# Patient Record
Sex: Female | Born: 1935
Health system: Southern US, Community
[De-identification: ages and names within clinical notes are randomized; demographics above are authoritative.]

## PROBLEM LIST (undated history)

## (undated) DIAGNOSIS — E039 Hypothyroidism, unspecified: Secondary | ICD-10-CM

## (undated) DIAGNOSIS — K219 Gastro-esophageal reflux disease without esophagitis: Secondary | ICD-10-CM

## (undated) DIAGNOSIS — M797 Fibromyalgia: Secondary | ICD-10-CM

## (undated) DIAGNOSIS — E782 Mixed hyperlipidemia: Secondary | ICD-10-CM

## (undated) DIAGNOSIS — N390 Urinary tract infection, site not specified: Secondary | ICD-10-CM

## (undated) DIAGNOSIS — R001 Bradycardia, unspecified: Secondary | ICD-10-CM

## (undated) DIAGNOSIS — M1990 Unspecified osteoarthritis, unspecified site: Secondary | ICD-10-CM

## (undated) DIAGNOSIS — Z9289 Personal history of other medical treatment: Secondary | ICD-10-CM

## (undated) DIAGNOSIS — F419 Anxiety disorder, unspecified: Secondary | ICD-10-CM

## (undated) HISTORY — DX: Personal history of other medical treatment: Z92.89

## (undated) HISTORY — DX: Hypothyroidism, unspecified: E03.9

## (undated) HISTORY — PX: ABDOMINAL HYSTERECTOMY: SHX81

## (undated) HISTORY — PX: CHOLECYSTECTOMY: SHX55

## (undated) HISTORY — DX: Bradycardia, unspecified: R00.1

## (undated) HISTORY — DX: Paroxysmal atrial fibrillation: I48.0

## (undated) HISTORY — DX: Anxiety disorder, unspecified: F41.9

## (undated) HISTORY — DX: Fibromyalgia: M79.7

## (undated) HISTORY — PX: MOHS SURGERY: SUR867

## (undated) HISTORY — DX: Essential (primary) hypertension: I10

## (undated) HISTORY — DX: Mixed hyperlipidemia: E78.2

## (undated) HISTORY — PX: ROTATOR CUFF REPAIR: SHX139

## (undated) HISTORY — DX: Urinary tract infection, site not specified: N39.0

## (undated) HISTORY — DX: Unspecified osteoarthritis, unspecified site: M19.90

## (undated) HISTORY — DX: Gastro-esophageal reflux disease without esophagitis: K21.9

---

## 2001-06-08 ENCOUNTER — Encounter: Payer: Self-pay | Admitting: Family Medicine

## 2001-06-08 ENCOUNTER — Encounter: Admission: RE | Admit: 2001-06-08 | Discharge: 2001-06-08 | Payer: Self-pay | Admitting: Family Medicine

## 2005-06-09 ENCOUNTER — Ambulatory Visit: Payer: Self-pay | Admitting: Family Medicine

## 2006-06-14 ENCOUNTER — Ambulatory Visit: Payer: Self-pay | Admitting: Family Medicine

## 2006-06-23 ENCOUNTER — Encounter: Admission: RE | Admit: 2006-06-23 | Discharge: 2006-06-23 | Payer: Self-pay | Admitting: Family Medicine

## 2007-05-12 ENCOUNTER — Ambulatory Visit (HOSPITAL_COMMUNITY): Admission: RE | Admit: 2007-05-12 | Discharge: 2007-05-12 | Payer: Self-pay | Admitting: Urology

## 2007-05-31 ENCOUNTER — Other Ambulatory Visit: Payer: Self-pay

## 2007-05-31 ENCOUNTER — Ambulatory Visit: Payer: Self-pay | Admitting: Urology

## 2007-06-08 ENCOUNTER — Ambulatory Visit: Payer: Self-pay | Admitting: Urology

## 2007-08-15 ENCOUNTER — Encounter: Admission: RE | Admit: 2007-08-15 | Discharge: 2007-08-15 | Payer: Self-pay | Admitting: Family Medicine

## 2007-11-01 ENCOUNTER — Ambulatory Visit (HOSPITAL_COMMUNITY): Admission: RE | Admit: 2007-11-01 | Discharge: 2007-11-01 | Payer: Self-pay | Admitting: Family Medicine

## 2008-01-31 ENCOUNTER — Ambulatory Visit: Payer: Self-pay | Admitting: Urology

## 2008-08-01 ENCOUNTER — Ambulatory Visit: Payer: Self-pay | Admitting: Family Medicine

## 2008-09-13 ENCOUNTER — Encounter: Admission: RE | Admit: 2008-09-13 | Discharge: 2008-09-13 | Payer: Self-pay | Admitting: Family Medicine

## 2009-11-15 ENCOUNTER — Encounter: Admission: RE | Admit: 2009-11-15 | Discharge: 2009-11-15 | Payer: Self-pay | Admitting: Family Medicine

## 2011-10-22 ENCOUNTER — Other Ambulatory Visit: Payer: Self-pay | Admitting: Family Medicine

## 2011-10-22 DIAGNOSIS — Z1231 Encounter for screening mammogram for malignant neoplasm of breast: Secondary | ICD-10-CM

## 2011-11-13 ENCOUNTER — Ambulatory Visit
Admission: RE | Admit: 2011-11-13 | Discharge: 2011-11-13 | Disposition: A | Discharge status: Home / Self Care | Payer: Medicare HMO | Source: Ambulatory Visit | Attending: *Deleted | Admitting: *Deleted

## 2011-11-13 DIAGNOSIS — Z1231 Encounter for screening mammogram for malignant neoplasm of breast: Secondary | ICD-10-CM

## 2012-05-30 ENCOUNTER — Ambulatory Visit: Payer: Self-pay | Admitting: Family Medicine

## 2014-04-19 ENCOUNTER — Ambulatory Visit: Payer: Self-pay | Admitting: Ophthalmology

## 2014-04-19 DIAGNOSIS — Z01810 Encounter for preprocedural cardiovascular examination: Secondary | ICD-10-CM

## 2014-04-19 DIAGNOSIS — I10 Essential (primary) hypertension: Secondary | ICD-10-CM

## 2014-04-30 ENCOUNTER — Ambulatory Visit: Payer: Self-pay | Admitting: Ophthalmology

## 2014-06-04 ENCOUNTER — Ambulatory Visit: Payer: Self-pay | Admitting: Ophthalmology

## 2014-08-16 ENCOUNTER — Other Ambulatory Visit: Payer: Self-pay

## 2014-08-16 DIAGNOSIS — Z1231 Encounter for screening mammogram for malignant neoplasm of breast: Secondary | ICD-10-CM

## 2014-08-27 ENCOUNTER — Ambulatory Visit: Payer: Medicare HMO

## 2014-08-28 ENCOUNTER — Other Ambulatory Visit: Payer: Self-pay

## 2014-08-28 ENCOUNTER — Ambulatory Visit
Admission: RE | Admit: 2014-08-28 | Discharge: 2014-08-28 | Disposition: A | Discharge status: Home / Self Care | Payer: Medicare PPO | Source: Ambulatory Visit

## 2014-08-28 ENCOUNTER — Encounter (INDEPENDENT_AMBULATORY_CARE_PROVIDER_SITE_OTHER): Payer: Self-pay

## 2014-08-28 DIAGNOSIS — Z1231 Encounter for screening mammogram for malignant neoplasm of breast: Secondary | ICD-10-CM

## 2015-03-28 ENCOUNTER — Inpatient Hospital Stay
Admit: 2015-03-28 | Disposition: A | Discharge status: Home / Self Care | Payer: Self-pay | Attending: Internal Medicine | Admitting: Internal Medicine

## 2015-03-28 LAB — CBC WITH DIFFERENTIAL/PLATELET
BASOS ABS: 0.1 10*3/uL (ref 0.0–0.1)
BASOS PCT: 1.1 %
EOS ABS: 0.2 10*3/uL (ref 0.0–0.7)
Eosinophil %: 2.3 %
HCT: 42 % (ref 35.0–47.0)
HGB: 13.7 g/dL (ref 12.0–16.0)
LYMPHS ABS: 4.5 10*3/uL — AB (ref 1.0–3.6)
LYMPHS PCT: 42.8 %
MCH: 29.4 pg (ref 26.0–34.0)
MCHC: 32.6 g/dL (ref 32.0–36.0)
MCV: 90 fL (ref 80–100)
Monocyte #: 0.9 x10 3/mm (ref 0.2–0.9)
Monocyte %: 8.2 %
NEUTROS ABS: 4.8 10*3/uL (ref 1.4–6.5)
Neutrophil %: 45.6 %
Platelet: 280 10*3/uL (ref 150–440)
RBC: 4.66 10*6/uL (ref 3.80–5.20)
RDW: 13.7 % (ref 11.5–14.5)
WBC: 10.6 10*3/uL (ref 3.6–11.0)

## 2015-03-28 LAB — BASIC METABOLIC PANEL
ANION GAP: 12 (ref 7–16)
BUN: 13 mg/dL
CO2: 23 mmol/L
CREATININE: 0.83 mg/dL
Calcium, Total: 9.3 mg/dL
Chloride: 102 mmol/L
EGFR (African American): >60
GLUCOSE: 111 mg/dL — AB
POTASSIUM: 3.7 mmol/L
SODIUM: 137 mmol/L

## 2015-03-28 LAB — TROPONIN I: Troponin-I: <0.03 ng/mL

## 2015-03-28 LAB — TSH: THYROID STIMULATING HORM: 0.262 u[IU]/mL — AB

## 2015-03-28 LAB — MAGNESIUM: MAGNESIUM: 2 mg/dL

## 2015-03-28 LAB — T4, FREE: Free Thyroxine: 1.63 ng/dL — ABNORMAL HIGH

## 2015-03-29 ENCOUNTER — Other Ambulatory Visit: Payer: Self-pay | Admitting: Physician Assistant

## 2015-03-29 ENCOUNTER — Encounter: Payer: Self-pay | Admitting: Physician Assistant

## 2015-03-29 DIAGNOSIS — I4891 Unspecified atrial fibrillation: Secondary | ICD-10-CM | POA: Diagnosis not present

## 2015-03-29 DIAGNOSIS — I10 Essential (primary) hypertension: Secondary | ICD-10-CM

## 2015-03-29 LAB — COMPREHENSIVE METABOLIC PANEL
ALBUMIN: 3.3 g/dL — AB
ALK PHOS: 51 U/L
AST: 17 U/L
Anion Gap: 8 (ref 7–16)
BUN: 10 mg/dL
Bilirubin,Total: 0.7 mg/dL
CALCIUM: 8.5 mg/dL — AB
Chloride: 110 mmol/L
Co2: 23 mmol/L
Creatinine: 0.67 mg/dL
Glucose: 110 mg/dL — ABNORMAL HIGH
POTASSIUM: 3.8 mmol/L
SGPT (ALT): 12 U/L — ABNORMAL LOW
Sodium: 141 mmol/L
TOTAL PROTEIN: 5.8 g/dL — AB

## 2015-03-29 LAB — TROPONIN I

## 2015-03-29 LAB — CBC WITH DIFFERENTIAL/PLATELET
BASOS ABS: 0.1 10*3/uL (ref 0.0–0.1)
BASOS PCT: 0.8 %
EOS ABS: 0.2 10*3/uL (ref 0.0–0.7)
Eosinophil %: 2.6 %
HCT: 36.6 % (ref 35.0–47.0)
HGB: 12.2 g/dL (ref 12.0–16.0)
LYMPHS ABS: 3 10*3/uL (ref 1.0–3.6)
Lymphocyte %: 36.3 %
MCH: 30 pg (ref 26.0–34.0)
MCHC: 33.3 g/dL (ref 32.0–36.0)
MCV: 90 fL (ref 80–100)
MONO ABS: 0.6 x10 3/mm (ref 0.2–0.9)
Monocyte %: 7.7 %
NEUTROS ABS: 4.3 10*3/uL (ref 1.4–6.5)
Neutrophil %: 52.6 %
Platelet: 237 10*3/uL (ref 150–440)
RBC: 4.06 10*6/uL (ref 3.80–5.20)
RDW: 13.9 % (ref 11.5–14.5)
WBC: 8.2 10*3/uL (ref 3.6–11.0)

## 2015-03-29 LAB — HEMOGLOBIN A1C: Hemoglobin A1C: 5.6 %

## 2015-03-29 LAB — HEPARIN LEVEL (UNFRACTIONATED): ANTI-XA(UNFRACTIONATED): 0.96 [IU]/mL — AB (ref 0.30–0.70)

## 2015-04-01 ENCOUNTER — Encounter: Payer: Self-pay | Admitting: Physician Assistant

## 2015-04-19 ENCOUNTER — Encounter: Payer: Self-pay | Admitting: Cardiovascular Disease

## 2015-04-19 ENCOUNTER — Ambulatory Visit (INDEPENDENT_AMBULATORY_CARE_PROVIDER_SITE_OTHER): Payer: Medicare PPO | Admitting: Cardiovascular Disease

## 2015-04-19 ENCOUNTER — Encounter (INDEPENDENT_AMBULATORY_CARE_PROVIDER_SITE_OTHER): Payer: Self-pay

## 2015-04-19 VITALS — BP 122/68 | HR 45 | Ht 63.0 in | Wt 146.5 lb

## 2015-04-19 DIAGNOSIS — I4891 Unspecified atrial fibrillation: Secondary | ICD-10-CM

## 2015-04-19 DIAGNOSIS — F32A Depression, unspecified: Secondary | ICD-10-CM

## 2015-04-19 DIAGNOSIS — R5382 Chronic fatigue, unspecified: Secondary | ICD-10-CM | POA: Diagnosis not present

## 2015-04-19 DIAGNOSIS — E039 Hypothyroidism, unspecified: Secondary | ICD-10-CM

## 2015-04-19 DIAGNOSIS — F329 Major depressive disorder, single episode, unspecified: Secondary | ICD-10-CM

## 2015-04-19 DIAGNOSIS — F339 Major depressive disorder, recurrent, unspecified: Secondary | ICD-10-CM | POA: Insufficient documentation

## 2015-04-19 MED ORDER — APIXABAN 5 MG PO TABS: 5.0000 mg | ORAL_TABLET | Freq: Two times a day (BID) | ORAL | Status: DC

## 2015-04-19 MED ORDER — METOPROLOL TARTRATE 25 MG PO TABS: 25.0000 mg | ORAL_TABLET | Freq: Two times a day (BID) | ORAL | Status: DC

## 2015-04-19 NOTE — Assessment & Plan Note (Signed)
Maintaining normal sinus rhythm. Bradycardia could be contributing to her fatigue We have recommended that she cut her metoprolol down to 12.5 mg twice a day Would stay on anticoagulation for now. She has asymptomatic atrial fibrillation

## 2015-04-19 NOTE — Assessment & Plan Note (Signed)
History of chronic fatigue. Unclear if this is worse recently. Possible history of fibromyalgia Recommended a regular walking program

## 2015-04-19 NOTE — Patient Instructions (Signed)
You are doing well.  Please decrease the metoprolol down to 1/2 pill twice a day  If you have atrial fibrillation episodes, Take a full metoprolol, relax on the bed  Please call us if you have new issues that need to be addressed before your next appt.  Your physician wants you to follow-up in: 6 months.  You will receive a reminder letter in the mail two months in advance. If you don't receive a letter, please call our office to schedule the follow-up appointment.

## 2015-04-19 NOTE — Progress Notes (Signed)
Patient ID: Stacey Duncan, female    DOB: 14-Aug-1931, 81 y.o.   MRN: [id number]  HPI Comments: Stacey Duncan is an 81 year old woman with history of fibromyalgia, chronic fatigue, presenting to the hospital 03/28/2015 with atrial fibrillation, shortness of breath She reported having symptoms around Christmas 2015, with reflux, belching, esophageal pain. Symptoms recurred at the end of March with esophageal pain, belching, reflux. She reported significant family stress  She went to the hospital and heart rate was 120 bpm, atrial fibrillation. She was started on amiodarone bolus, heparin and converted back to normal sinus rhythm TSH 0.26, free T4 1 0.63 In the hospital she was started on metoprolol and eliquis 5 mg twice a day It was felt she had asymptomatic atrial fibrillation  Echocardiogram 03/29/2015 showed normal LV function, otherwise essentially normal study  In follow-up today she reports that she feels tired. She has not been monitoring her blood pressure or heart rate at home. She does not own a blood pressure cuff. She has seen Dr. Juanetta Gosling for her thyroid with adjustment to her medication No regular exercise, sedentary in general EKG on today's visit shows sinus bradycardia with rate 45 bpm, no significant ST or T-wave changes     Allergies  Allergen Reactions  . Sulfa Antibiotics Other (See Comments)    Outpatient Encounter Prescriptions as of 04/19/2015  Medication Sig  . apixaban (ELIQUIS) 5 MG TABS tablet Take 1 tablet (5 mg total) by mouth 2 (two) times daily.  . Cholecalciferol (VITAMIN D) 2000 UNITS CAPS Take 2,000 Units by mouth daily.  Marland Kitchen FLUoxetine (PROZAC) 40 MG capsule Take 40 mg by mouth daily.  Marland Kitchen levothyroxine (SYNTHROID, LEVOTHROID) 112 MCG tablet Take 112 mcg by mouth daily before breakfast.  . metoprolol tartrate (LOPRESSOR) 25 MG tablet Take 25 mg by mouth 2 (two) times daily.  Marland Kitchen omeprazole (PRILOSEC) 20 MG capsule Take 20 mg by mouth daily.  .  [DISCONTINUED] apixaban (ELIQUIS) 5 MG TABS tablet Take 5 mg by mouth 2 (two) times daily.  . metoprolol tartrate (LOPRESSOR) 25 MG tablet Take 1 tablet (25 mg total) by mouth 2 (two) times daily.    Past Medical History  Diagnosis Date  . PAF (paroxysmal atrial fibrillation)     a. on Eliquis;  . HTN (hypertension)   . GERD (gastroesophageal reflux disease)   . Hypothyroidism   . Osteoarthritis     a. bilateral knees  . Fibromyalgia   . Hypothyroidism     Past Surgical History  Procedure Laterality Date  . Rotator cuff repair      left   . Abdominal hysterectomy    . Cholecystectomy      Social History  reports that she has never smoked. She does not have any smokeless tobacco history on file. She reports that she drinks about 3.0 oz of alcohol per week. She reports that she does not use illicit drugs.  Family History family history includes Heart disease (age of onset: 31) in her mother.   Review of Systems  Constitutional: Positive for fatigue.  Respiratory: Negative.   Cardiovascular: Negative.   Gastrointestinal: Negative.   Musculoskeletal: Negative.   Skin: Negative.   Neurological: Negative.   Hematological: Negative.   Psychiatric/Behavioral: Negative.   All other systems reviewed and are negative.   BP 122/68 mmHg  Pulse 45  Ht  (1.6 m)  Wt 146 lb 8 oz (66.452 kg)  BMI 25.96 kg/m2  Physical Exam  Constitutional: She is oriented to person,  place, and time. She appears well-developed and well-nourished.  HENT:  Head: Normocephalic.  Nose: Nose normal.  Mouth/Throat: Oropharynx is clear and moist.  Eyes: Conjunctivae are normal. Pupils are equal, round, and reactive to light.  Neck: Normal range of motion. Neck supple. No JVD present.  Cardiovascular: Regular rhythm, S1 normal, S2 normal, normal heart sounds and intact distal pulses.  Bradycardia present.  Exam reveals no gallop and no friction rub.   No murmur heard. Pulmonary/Chest: Effort  normal and breath sounds normal. No respiratory distress. She has no wheezes. She has no rales. She exhibits no tenderness.  Abdominal: Soft. Bowel sounds are normal. She exhibits no distension. There is no tenderness.  Musculoskeletal: Normal range of motion. She exhibits no edema or tenderness.  Lymphadenopathy:    She has no cervical adenopathy.  Neurological: She is alert and oriented to person, place, and time. Coordination normal.  Skin: Skin is warm and dry. No rash noted. No erythema.  Psychiatric: She has a normal mood and affect. Her behavior is normal. Judgment and thought content normal.    Assessment and Plan  Nursing note and vitals reviewed.

## 2015-04-19 NOTE — Assessment & Plan Note (Signed)
Thyroid disease managed by Dr. Juanetta GoslingHawkins

## 2015-04-19 NOTE — Assessment & Plan Note (Signed)
She reports this is stable. Recommended a regular exercise program

## 2015-04-20 NOTE — Op Note (Signed)
PATIENT NAME:  Stacey Duncan, Stacey Duncan MR#:  [id number] DATE OF BIRTH:  13-Jan-1931  DATE OF PROCEDURE:  06/04/2014  PREOPERATIVE DIAGNOSIS: Cataract, right eye.   POSTOPERATIVE DIAGNOSIS: Cataract, right eye.  PROCEDURE PERFORMED: Extracapsular cataract extraction using phacoemulsification with placement of Alcon SN6CWS, 21.5-diopter posterior chamber lens, serial number [id number].   SURGEON: Maylon PeppersSteven A. Tamika Shropshire, M.D.   ANESTHESIA: 4% lidocaine and 0.75% Marcaine a 50-50 mixture with 10 units/mL of HyoMax added, given as a peribulbar.   ANESTHESIOLOGIST: Dr. Randel PiggPolin.   COMPLICATIONS: None.   ESTIMATED BLOOD LOSS: Less than 1 mL.   DESCRIPTION OF PROCEDURE: PREOPERATIVE DIAGNOSIS:  Cataract, right eye.   POSTOPERATIVE DIAGNOSIS:  Cataract, right eye.  PROCEDURE PERFORMED:  Extracapsular cataract extraction using phacoemulsification with placement of an Alcon [id number], >>_______________<< -diopter posterior chamber lens, serial # >>_______________<< .  Alvy BealSURGEONMaylon Peppers:  Maeve Debord A. Dorothye Berni, MD  ASSISTANT:  None.  ANESTHESIA:  4% lidocaine and 0.75% Marcaine in a 50/50 mixture without Wydase added, given as a peribulbar.  ANESTHESIOLOGIST:  >>_______________<<  COMPLICATIONS:  None.  ESTIMATED BLOOD LOSS:  Less than 1 ml.  DESCRIPTION OF PROCEDURE:  The patient was brought to the operating room and given a peribulbar block.  The patient was then prepped and draped in the usual fashion.  The vertical rectus muscles were imbricated using 5-0 silk sutures.  These sutures were then clamped to the sterile drapes as bridle sutures.  A limbal peritomy was performed extending two clock hours and hemostasis was obtained with cautery.  A partial thickness scleral groove was made at the surgical limbus and dissected anteriorly in a lamellar dissection using an Alcon crescent knife.  The anterior chamber was entered superonasally with a Superblade and through the lamellar dissection with a 2.6 mm  keratome.  DisCoVisc was used to replace the aqueous and a continuous tear capsulorrhexis was carried out.  Hydrodissection and hydrodelineation were carried out with balanced salt and a 27 gauge canula.  The nucleus was rotated to confirm the effectiveness of the hydrodissection.  Phacoemulsification was carried out using a divide-and-conquer technique.  Total ultrasound time was 1 minutes and 20.5 seconds with an average power of  26%. CDE 37.50.  Irrigation/aspiration was used to remove the residual cortex.  DisCoVisc was used to inflate the capsule and the internal incision was enlarged to 3 mm with the crescent knife.  The intraocular lens was folded and inserted into the capsular bag using the AcrySert delivery system.  Irrigation/aspiration was used to remove the residual DisCoVisc.  Miostat was injected into the anterior chamber through the paracentesis track to inflate the anterior chamber and induce miosis.  A tenth of a mL of cefuroxime was injected via the paracentesis track containing 1 mg of drug. The wound was checked for leaks and none were found. The conjunctiva was closed with cautery and the bridle sutures were removed.  Two drops of 0.3% Vigamox were placed on the eye.   An eye shield was placed on the eye.  The patient was discharged to the recovery room in good condition.  ____________________________ Maylon PeppersSteven A. Ladajah Soltys, MD sad:aw D: 06/04/2014 13:41:58 ET T: 06/04/2014 14:03:19 ET JOB#: [id number]  cc: Viviann SpareSteven A. Kessler Solly, MD, <Dictator> Erline LevineSTEVEN A Rashi Granier MD ELECTRONICALLY SIGNED 06/11/2014 13:46

## 2015-04-20 NOTE — Op Note (Signed)
PATIENT NAME:  Stacey Duncan, Stacey Duncan MR#:  [id number] DATE OF BIRTH:  1931/11/19  DATE OF PROCEDURE:  04/30/2014  PREOPERATIVE DIAGNOSIS:  Cataract, left eye.    POSTOPERATIVE DIAGNOSIS:  Cataract, left eye.  PROCEDURE PERFORMED:  Extracapsular cataract extraction using phacoemulsification with placement of an Alcon SN6CWS, 24.5-diopter posterior chamber lens, serial [id number].  SURGEON:  Maylon PeppersSteven A. Laydon Martis, MD  ASSISTANT:  None.  ANESTHESIA:  4% lidocaine and 0.75% Marcaine in a 50/50 mixture with 10 units/mL of Hylenex added, given as a peribulbar.   ANESTHESIOLOGIST:  Dr. Henrene HawkingKephart.  COMPLICATIONS:  None.  ESTIMATED BLOOD LOSS:  Less than 1 ml.  DESCRIPTION OF PROCEDURE:  The patient was brought to the operating room and given a peribulbar block.  The patient was then prepped and draped in the usual fashion.  The vertical rectus muscles were imbricated using 5-0 silk sutures.  These sutures were then clamped to the sterile drapes as bridle sutures.  A limbal peritomy was performed extending two clock hours and hemostasis was obtained with cautery.  A partial thickness scleral groove was made at the surgical limbus and dissected anteriorly in a lamellar dissection using an Alcon crescent knife.  The anterior chamber was entered supero-temporally with a Superblade and through the lamellar dissection with a 2.6 mm keratome.  DisCoVisc was used to replace the aqueous and a continuous tear capsulorrhexis was carried out.  Hydrodissection and hydrodelineation were carried out with balanced salt and a 27 gauge canula.  The nucleus was rotated to confirm the effectiveness of the hydrodissection.  Phacoemulsification was carried out using a divide-and-conquer technique.  Total ultrasound time was 1 minute and 26.5 seconds with an average power of 26.2 percent and CDE preservative-free 38.61.  Irrigation/aspiration was used to remove the residual cortex.  DisCoVisc was used to inflate the capsule  and the internal incision was enlarged to 3 mm with the crescent knife.  The intraocular lens was folded and inserted into the capsular bag using the AcrySert delivery system.  Irrigation/aspiration was used to remove the residual DisCoVisc.  Miostat was injected into the anterior chamber through the paracentesis track to inflate the anterior chamber and induce miosis.  The wound was checked for leaks and none were found. The conjunctiva was closed with cautery and the bridle sutures were removed.  Two drops of 0.3% Vigamox were placed on the eye.   An eye shield was placed on the eye.  The patient was discharged to the recovery room in good condition.  ____________________________ Maylon PeppersSteven A. Lynnetta Tom, MD sad:sb D: 04/30/2014 12:49:10 ET T: 04/30/2014 13:03:17 ET JOB#: [id number]  cc: Viviann SpareSteven A. Chanice Brenton, MD, <Dictator> Erline LevineSTEVEN A [place] Skalicky MD ELECTRONICALLY SIGNED 05/07/2014 11:38

## 2015-04-24 DIAGNOSIS — Z961 Presence of intraocular lens: Secondary | ICD-10-CM | POA: Diagnosis not present

## 2015-04-28 NOTE — Discharge Summary (Signed)
PATIENT NAME:  Stacey Duncan, Stacey Duncan MR#:  [id number] DATE OF BIRTH:  1931/09/01  DATE OF ADMISSION:  03/28/2015 DATE OF DISCHARGE:  03/29/2015  DISCHARGE DIAGNOSES:  1.  Atrial fibrillation with rapid ventricular response.  2.  Hypothyroidism. Low TSH on replacement.  3.  Hypertension.   MEDICATIONS ON DISCHARGE: 1.  Simvastatin 40 mg once a day.  2.  Prozac 40 mg capsule once a day.  3.  Omeprazole 20 mg oral delayed-release once a day.  4.  Levothyroxine 112 mcg once a day.  6.  Apixiban 5 mg oral tablet 2 times a day.  7.  Metoprolol 25 mg 2 times a day.   DIET ON DISCHARGE:  Low sodium, low fat, low cholesterol, regular consistency.   ACTIVITY: As tolerated.   DISCHARGE INSTRUCTIONS:  Advised to follow within 1 to 2 weeks in cardiology clinic.   HISTORY OF PRESENT ILLNESS: An 81 year old female who presented to the Emergency Room because of complaint of dizziness. Also had some heartburn and GI symptoms since around Christmas.  She started having this type of episode of lightheadedness.  They were brief episodes and near syncopal for a short period of time.  She came to the Emergency Room after having this type of episode and she was found to have atrial fibrillation with rapid ventricular response and also having some short runs of ventricular tachycardia.  Started on amiodarone drip and heparin drip and requested to see Dr. Mariah MillingGollan in the hospital after admission.  HOSPITAL COURSE AND STAY:  1.  For atrial fibrillation with rapid ventricular response, she was seen by cardiology and she was converted to sinus rhythm by amiodarone.  Cardiology suggested to increase the dose of metoprolol and started on Eliquis for stroke prevention.  She had also low TSH levels, so we decreased the dose of her Synthroid and advised to follow in the office with PMD within a month.  2.  Nonsustained ventricular tachycardia.  She was converted to normal sinus rhythm and was on metoprolol.  Advised to follow  in cardiology clinic.  3.  Hypothyroidism. TSH was low and her PMD in the office last week decreased her dose.  Most likely this episode of atrial fibrillation might be presentation due to her hypothyroidism. Also advised to follow with PMD in the office in a month to check her TSH level.  4.  Hypertension.  Metoprolol was able to control it.  CONSULTATIONS IN HOSPITAL:  Cardiology by Dr. Eula Listenyan Dunn.     IMPORTANT LABORATORY RESULTS:   1.  WBC count 10.6, hemoglobin 13.7, platelet count 280,000, MCV is 90.  2.  Glucose is 111, BUN 13, creatinine 0.83, sodium 137, potassium 3.7, chloride is 102. 3.  Troponin less than 0.03. HbA1c 5.6.  4.  Echocardiogram:  Ejection fraction 60 to 65%, normal global left ventricular systolic function, and normal right ventricular size and systolic function.   Total time spent on this discharge is 40 minutes.    ____________________________ Hope PigeonVaibhavkumar G. Elisabeth PigeonVachhani, MD vgv:sp D: 04/02/2015 16:23:56 ET T: 04/02/2015 16:37:15 ET JOB#: [id number]  cc: Hope PigeonVaibhavkumar G. Elisabeth PigeonVachhani, MD, <Dictator> Mickie HillierJack H. Sheppard PentonWolf, MD Antonieta Ibaimothy Duncan. Gollan, MD    Heath GoldVAIBHAVKUMAR [facility] LLC[facility]ACHHANI MD ELECTRONICALLY SIGNED 04/04/2015 0:38

## 2015-04-28 NOTE — Consult Note (Signed)
General Aspect Primary Cardiologist: New to [facility] ______________  81 year old female with history of HTN, hypothyroidism, GERD, and osteoarthritis who presented to [facility] on 3/31 with new onset a-fib.  ______________  PMH: 1. HTN 2. Hypothyroidism 3. GERD 4. Osteoarthritis ______________   Present Illness 81 year old female with the above problem list who presented to [facility] on 3/31 with the above CC. She has no known previously known cardiac history and has never seen a cardiologist before. No prior stress tests or cardiac caths.   A couple of years ago she was seen by her PCP and noted to have a blood pressure in the 180s/100s. She was started on antihypertensives at that time. She reports her blood pressure has remained well controlled since then. Her PCP did order a Holter monitor for her to wear a couple of years ago 2/2 palpitations. Per the patient's report this was unremarkable at that time and no further work up was done.   Around Christmas time of 2015 she began to feel what she felt like was reflux as she has a history of reflux. She reports belching quite a lot. States she had "esophageal pain." This lasted for less than one day and self resolved. She did not have any associated symptoms at that time, including palpitations. She did not think to check her pulse at that time. She did not have any further reflux until 3/31.  On 3/31 she again had reflux with associated belching and associated "esophageal pain." She did not have any associated palpitations, SOB, chest pain, nausea, vomiting, presyncope, or syncope. She did check her radial pulse and noted it to be irregular and tachycardic. She did not take any medications. She has been under a large amount of stress lately with both family and friends. Her son recently moved back home, he is going through a divroce. She is plannign a wedding in another [place]. She is helping at [employer]. She worries about the house. She has not been sick  recently.   Upon her arrival to [facility] she was noted to be in new onset a-fib with RVR with HR in the 120s. She was started on amiodarone bolus and heparin gtt and converted back to NSR while not on telemetry. There was also mention of possible NSVT, though this was actually just a 3 ventricular beats and not NSVT. Labs were significant for troponin negative x 3, TSH low at 0.262, free T4 elevated at 1.63, K+ 3.7-->3.8, Mg 2.0, CXR negative. Echo is pending. She is currently resting comfotably in her bed.   Physical Exam:  GEN no acute distress   HEENT PERRL, hearing intact to voice, moist oral mucosa   NECK supple  trachea midline  no JVD   RESP normal resp effort  clear BS   CARD Regular rate and rhythm  Normal, S1, S2  No murmur   ABD denies tenderness  soft   EXTR negative edema   SKIN normal to palpation   NEURO cranial nerves intact   PSYCH alert, A+O to time, place, person, good insight   Review of Systems:  General: Fatigue  Weakness   Skin: No Complaints   ENT: No Complaints   Eyes: No Complaints   Neck: No Complaints   Respiratory: Short of breath   Cardiovascular: No Complaints   Gastrointestinal: Heartburn   Genitourinary: No Complaints   Vascular: No Complaints   Musculoskeletal: No Complaints   Neurologic: No Complaints   Hematologic: No Complaints  Endocrine: No Complaints   Psychiatric: No Complaints   Review of Systems: All other systems were reviewed and found to be negative   Medications/Allergies Reviewed Medications/Allergies reviewed   Family & Social History:  Family and Social History:  Family History Coronary Artery Disease  Hypertension  Diabetes Mellitus  Cancer   Social History negative tobacco, negative ETOH, negative Illicit drugs   Place of Living Home  lives with husband and son     htn:    Hypothyroidism:    Osteoporosis:    A-Fib:   Home Medications: Medication Instructions Status  Tylenol 8 HR  Arthritis Pain 650 mg oral tablet, extended release 2 tab(s) orally every 8 hours, As Needed Active  levothyroxine 112 mcg (0.112 mg) oral tablet 1 tab(s) orally once a day Active  losartan 25 mg oral tablet 1 tab(s) orally once a day Active  omeprazole 20 mg oral delayed release capsule 1 cap(s) orally once a day Active  PROzac 40 mg oral capsule 1 cap(s) orally once a day Active  simvastatin 40 mg oral tablet 1 tab(s) orally once a day (at bedtime) Active   Lab Results:  Thyroid:  31-Mar-16 17:56   Thyroxine, Free  1.63 (0.61-1.12 NOTE: New Reference Range:  03/05/15)  Thyroid Stimulating Hormone  0.262 (0.350-4.500 NOTE: New Reference Range  03/05/15)  Hepatic:  01-Apr-16 05:01   Bilirubin, Total 0.7 (0.3-1.2 NOTE: New Reference Range  03/05/15)  Alkaline Phosphatase 51 (38-126 NOTE: New Reference Range  03/05/15)  SGPT (ALT)  12 (14-54 NOTE: New Reference Range  03/05/15)  SGOT (AST) 17 (15-41 NOTE: New Reference Range  03/05/15)  Total Protein, Serum  5.8 (6.5-8.1 NOTE: New Reference Range  03/05/15)  Albumin, Serum  3.3 (3.5-5.0 NOTE: New reference range  03/05/15)  Routine Chem:  31-Mar-16 17:56   Potassium, Serum 3.7 (3.5-5.1 NOTE: New Reference Range  03/05/15)  01-Apr-16 05:01   Glucose, Serum  110 (65-99 NOTE: New Reference Range  03/05/15)  BUN 10 (6-20 NOTE: New Reference Range  03/05/15)  Creatinine (comp) 0.67 (0.44-1.00 NOTE: New Reference Range  03/05/15)  Sodium, Serum 141 (135-145 NOTE: New Reference Range  03/05/15)  Potassium, Serum 3.8 (3.5-5.1 NOTE: New Reference Range  03/05/15)  Chloride, Serum 110 (101-111 NOTE: New Reference Range  03/05/15)  CO2, Serum 23 (22-32 NOTE: New Reference Range  03/05/15)  Calcium (Total), Serum  8.5 (8.9-10.3 NOTE: New Reference Range  03/05/15)  eGFR (African American) >60  eGFR (Non-African American) >60 (eGFR values <72m/min/1.73 m2 may be an indication of chronic kidney disease  (CKD). Calculated eGFR is useful in patients with stable renal function. The eGFR calculation will not be reliable in acutely ill patients when serum creatinine is changing rapidly. It is not useful in patients on dialysis. The eGFR calculation may not be applicable to patients at the low and high extremes of body sizes, pregnant women, and vegetarians.)  Anion Gap 8  Cardiac:  31-Mar-16 17:56   Troponin I <0.03 (0.00-0.03 0.03 ng/mL or less: NEGATIVE  Repeat testing in 3-6 hrs  if clinically indicated. >0.05 ng/mL: POTENTIAL  MYOCARDIAL INJURY. Repeat  testing in 3-6 hrs if  clinically indicated. NOTE: An increase or decrease  of 30% or more on serial  testing suggests a  clinically important change NOTE: New Reference Range  03/05/15)    23:00   Troponin I <0.03 (0.00-0.03 0.03 ng/mL or less: NEGATIVE  Repeat testing in 3-6 hrs  if clinically indicated. >0.05 ng/mL: POTENTIAL  MYOCARDIAL INJURY.  Repeat  testing in 3-6 hrs if  clinically indicated. NOTE: An increase or decrease  of 30% or more on serial  testing suggests a  clinically important change NOTE: New Reference Range  03/05/15)  01-Apr-16 01:55   Troponin I <0.03 (0.00-0.03 0.03 ng/mL or less: NEGATIVE  Repeat testing in 3-6 hrs  if clinically indicated. >0.05 ng/mL: POTENTIAL  MYOCARDIAL INJURY. Repeat  testing in 3-6 hrs if  clinically indicated. NOTE: An increase or decrease  of 30% or more on serial  testing suggests a  clinically important change NOTE: New Reference Range  03/05/15)  Routine Hem:  01-Apr-16 05:01   WBC (CBC) 8.2  RBC (CBC) 4.06  Hemoglobin (CBC) 12.2  Hematocrit (CBC) 36.6  Platelet Count (CBC) 237  MCV 90  MCH 30.0  MCHC 33.3  RDW 13.9  Neutrophil % 52.6  Lymphocyte % 36.3  Monocyte % 7.7  Eosinophil % 2.6  Basophil % 0.8  Neutrophil # 4.3  Lymphocyte # 3.0  Monocyte # 0.6  Eosinophil # 0.2  Basophil # 0.1 (Result(s) reported on 29 Mar 2015 at 05:45AM.)    EKG:  EKG Interp. by me   Interpretation a-fib with RVR, 126 bpm, left axis deviation, no significant st/t changes    Sulfa drugs: N/V  Vital Signs/Nurse's Notes: **Vital Signs.:   01-Apr-16 03:42  Vital Signs Type Routine  Temperature Temperature (F) 97.9  Celsius 36.6  Temperature Source oral  Pulse Pulse 66  Respirations Respirations 18  Systolic BP Systolic BP 280  Diastolic BP (mmHg) Diastolic BP (mmHg) 72  Mean BP 89  Pulse Ox % Pulse Ox % 96  Pulse Ox Activity Level  At rest  Oxygen Delivery Room Air/ 21 %    Impression 81 year old female with history of HTN, hypothyroidism, GERD, and osteoarthritis who presented to [facility] on 3/31 with new onset a-fib.   1. New onset a-fib: -Converted sometime overnight, not on telemetry after receiving amiodarone 150 mg bolus -Currently on heparin gtt -Echo is pending to evaluate LV function and wall motion -CHADSVASc at least 4, giving her an estimated annual risk of stroke at 4.0%  -Plan to either Eliquis 5 mg bid or Xarelto 20 mg with supper -Would add rate control with Lopressor 12.5 mg bid -Outpatient follow up -Stressor likely her lifestyle stressors and she is found to be hyperthyroid  2. HTN: -Controlled -Lopressor added as above  3. Hyperthyroidism: -Question methimazole -Follow up with PCP  4. Hyperglycemia: -Check A1C for further risk stratification of #1  5. No documented NSVT   Electronic Signatures for Addendum Section:  Kathlyn Sacramento (MD) (Signed Addendum 01-Apr-16 10:56)  The patient was seen and examined. Agree with the above. She presented with palpitations and was found to be in A-fib with RVR. She converted to NSR with Amiodarone bolus. no murmurs by exam. echo is pending.  Recommend: I increased Metoprolol to 25 mg bid.  Started anticoaulgation with Eliquis 5 mg bid. risks and benefits were explained.  Follow up in 2 weeks.   Electronic Signatures: Kathlyn Sacramento (MD)  (Signed 01-Apr-16  10:56)  Co-Signer: General Aspect/Present Illness, History and Physical Exam, Review of System, Family & Social History, Past Medical History, Home Medications, Labs, EKG , Allergies, Vital Signs/Nurse's Notes, Impression/Plan Emilo Gras M (PA-C)  (Signed 01-Apr-16 10:18)  Authored: General Aspect/Present Illness, History and Physical Exam, Review of System, Family & Social History, Past Medical History, Home Medications, Labs, EKG , Allergies, Vital Signs/Nurse's Notes, Impression/Plan  Last Updated: 01-Apr-16 10:56 by Kathlyn Sacramento (MD)

## 2015-04-28 NOTE — H&P (Signed)
PATIENT NAME:  Stacey Duncan, PLATAS MR#:  [id number] DATE OF BIRTH:  December 23, 1931  DATE OF ADMISSION:  03/28/2015  CHIEF COMPLAINT:  Dizziness.   HISTORY OF PRESENT ILLNESS:  This is an 81 year old female who presents to the ED tonight with a primary complaint of dizziness as well as some, what she described as, heartburn and GI symptoms. She states that she has a chronic history of GERD and that she takes omeprazole for this, but she has never had overt chest pain before. She states that around Christmastime, she started having these episodes of lightheadedness; they were brief episodes, and she would get lightheaded and near syncopal for a short period of time. These episodes coincided quite frequently with exacerbations of her reflux symptoms or what she thought were her reflux symptoms. She states that the pain was always burning pain in the back of her throat and never really chest pain. At different times that these episodes over the past couple of months, she did check her pulse and felt like her heartbeat was irregular. These episodes would come and go. They were not positionally related, and she denies any diaphoresis or vision changes, nausea, vomiting, chest pain and radiation with any of these episodes. She came to the ED tonight after one of these episodes and was found to be in atrial fibrillation with RVR and also having short runs of ventricular tachycardia. She was started on amiodarone drip and heparin drip, and the hospitalists were called for admission. Of note, the patient's husband sees cardiologist, Dr. Mariah Milling with [facility], and the patient requests the same cardiologist see her, PCP Cay Schillings, MD. Of note, the patient states that she has very volatile and hard-to-control hypothyroidism.   PAST MEDICAL HISTORY:  Hypothyroidism, osteoporosis, hypertension, fibromyalgia.   CURRENT MEDICATIONS:  Tylenol p.r.n., simvastatin 40 mg daily, Prozac 40 mg daily, omeprazole 20 mg daily,  losartan 25 mg daily, Synthroid 112 mcg daily, Motrin p.r.n.   PAST SURGICAL HISTORY:  Hysterectomy, cholecystectomy, left knee repair, and left rotator cuff surgery.   ALLERGIES:  SULFA DRUGS, WHICH CAUSE NAUSEA AND VOMITING.   FAMILY HISTORY:  Hypertension, CAD, diabetes mellitus, cancer.   SOCIAL HISTORY:  Nonsmoker. He drinks 1 glass of wine daily and denies any illicit drug use.   REVIEW OF SYSTEMS: CONSTITUTIONAL:  Denies fever, fatigue, or weakness.  EYES:  Denies blurred or double vision, pain, or redness.  EAR, NOSE, AND THROAT:  Denies ear pain, hearing loss, or difficulty swallowing.  RESPIRATORY:  Denies cough, wheeze, or dyspnea.  CARDIOVASCULAR:  Denies chest pain. Denies edema. Denies palpitations. Endorses near syncopal episodes. Please see HPI for details.  GASTROINTESTINAL:  Denies nausea, vomiting, diarrhea, abdominal pain, or constipation.  GENITOURINARY:  Denies dysuria, frequency, or incontinence.  ENDOCRINE:  Denies nocturia, thyroid problems or heat or cold intolerance.  HEMATOLOGIC AND LYMPHATIC:  Denies easy bruising or bleeding or swollen glands.  INTEGUMENTARY:  Denies acne, rash, or lesion.  MUSCULOSKELETAL:  Denies acute arthritis, joint swelling, or gout.  NEUROLOGICAL:  Denies numbness, weakness, or headache.  PSYCHIATRIC:  Denies anxiety, insomnia, or depression.   PHYSICAL EXAMINATION: VITAL SIGNS:  Blood pressure 116/79, pulse 126, temperature 98.3, respirations 16, saturating 98% on room air.  GENERAL:  This is a well-nourished, elderly female lying comfortably supine in bed.  HEENT:  Pupils are equal, round, and reactive to light. Extraocular movements are intact. No scleral icterus. Moist mucosal membranes.  NECK:  Thyroid is not enlarged. Neck is supple and nontender. No  cervical adenopathy. No JVD.  RESPIRATORY:  Lungs are clear to auscultation bilaterally with no rales, rhonchi, or wheezes. No respiratory distress.  CARDIOVASCULAR:  Heart rate  is irregular. There is no murmur, rub, or gallop auscultated on exam. Good pedal pulses with no lower extremity edema.  ABDOMEN:  Soft, nontender, nondistended. Good bowel sounds. No hepatosplenomegaly.  MUSCULOSKELETAL:  Muscular strength 5/5 in all 4 extremities with full spontaneous range of motion throughout. No cyanosis or clubbing.   SKIN:  No rash or lesions. Skin is warm, dry, and intact.  LYMPHATIC:  No adenopathy.  NEUROLOGICAL:  Cranial nerves are intact. Sensation is intact throughout. No dysarthria or aphasia.  PSYCHIATRIC:  The patient is alert and oriented x 3. She is cooperative and not confused with good judgment and insight.   LABORATORY DATA:  White count is 10.6, hemoglobin 13.7, hematocrit 42, platelets 280,000. Sodium is 137, potassium 3.7, chloride 102, bicarbonate 23, BUN 13, creatinine 0.83, and glucose is 111. Troponin is less than 0.03 x 2. TSH is 0.262. Free T4 is 1.63.   RADIOLOGIC DATA:  Chest x-ray:  No acute cardiopulmonary disease.   ASSESSMENT AND PLAN: 1.  Atrial fibrillation with rapid ventricular response. This is a new diagnosis for this patient, although it sounds like she has been having episodes of atrial fibrillation for the past couple of months now. She was started on an amiodarone drip with improving rate control. She was also started on a heparin drip. We will get an echocardiogram, trend her troponins, and get a cardiology consult.  2.  Nonsustained ventricular tachycardia. The patient had a few runs of nonsustained ventricular tachycardia. Plan for this will be the same as for the atrial fibrillation with rapid ventricular response. This is a big part of why amiodarone was chosen to control her atrial fibrillation over other rate-controlling agents.  3.  Hypothyroidism. We will check a TSH and a T4 with those labs that are reported above. We will continue her Synthroid here, and given her increased T4 level, we will consider decreasing her Synthroid  dose, perhaps getting an endocrine consult given the fact that she may be left on amiodarone. We will wait for cardiology's recommendations about this first and then go from there.  4.  Hypertension. Her blood pressure was actually low here; this was another reason that amiodarone was chosen over Cardizem for rate control. We will hold her antihypertensives for now and restart them as needed based on her response to these other medications.  5.  Deep vein thrombosis prophylaxis. The patient is fully anticoagulated on a heparin drip.   CODE STATUS:  Full code.   TIME SPENT ON THIS ADMISSION:  50 minutes.    ____________________________ Candace Cruiseavid F. Anne HahnWillis, MD dfw:[id number] D: 03/29/2015 02:03:37 ET T: 03/29/2015 02:46:39 ET JOB#: [id number]  cc: Candace Cruiseavid F. Anne HahnWillis, MD, <Dictator> Gurkaran Rahm Scotty CourtF Rifka Ramey MD ELECTRONICALLY SIGNED 03/29/2015 5:45

## 2015-04-29 ENCOUNTER — Telehealth: Payer: Self-pay | Admitting: *Deleted

## 2015-04-29 ENCOUNTER — Other Ambulatory Visit: Payer: Self-pay | Admitting: *Deleted

## 2015-04-29 MED ORDER — APIXABAN 5 MG PO TABS: 5.0000 mg | ORAL_TABLET | Freq: Two times a day (BID) | ORAL | Status: DC

## 2015-04-29 NOTE — Telephone Encounter (Signed)
°  1. Which medications need to be refilled? Eliquis 5 mg   2. Which pharmacy is medication to be sent to? [facility] order  3. Do they need a 30 day or 90 day supply? 90 day   4. Would they like a call back once the medication has been sent to the pharmacy? Yes  Is on her last pill, needs this done today. Would like to know when we have done this.

## 2015-04-29 NOTE — Telephone Encounter (Signed)
Rx sent to local pharmacy for Eliquis.

## 2015-06-24 ENCOUNTER — Ambulatory Visit (INDEPENDENT_AMBULATORY_CARE_PROVIDER_SITE_OTHER): Payer: Medicare PPO | Admitting: Family Medicine

## 2015-06-24 ENCOUNTER — Encounter: Payer: Self-pay | Admitting: Family Medicine

## 2015-06-24 VITALS — BP 124/69 | HR 54 | Resp 16 | Ht 63.0 in | Wt 141.0 lb

## 2015-06-24 DIAGNOSIS — S6000XA Contusion of unspecified finger without damage to nail, initial encounter: Secondary | ICD-10-CM

## 2015-06-24 MED ORDER — CEPHALEXIN 500 MG PO CAPS: 500.0000 mg | ORAL_CAPSULE | Freq: Four times a day (QID) | ORAL | Status: DC

## 2015-06-24 NOTE — Progress Notes (Signed)
Name: Stacey Duncan   MRN: [id number]    DOB: [birth year]   Date:06/24/2015       Progress Note  Subjective  Chief Complaint  Chief Complaint  Patient presents with  . Hand Pain    Rt hand First Digit (Thumb)    HPI  Caught R thumb in  Door 3 weeks ago.  Still sore and swollen.  Pain with motion of distal thumb.  Trauma at nail edge.   Past Medical History  Diagnosis Date  . PAF (paroxysmal atrial fibrillation)     a. on Eliquis;  . HTN (hypertension)   . GERD (gastroesophageal reflux disease)   . Hypothyroidism   . Osteoarthritis     a. bilateral knees  . Fibromyalgia   . Hypothyroidism     History  Substance Use Topics  . Smoking status: Never Smoker   . Smokeless tobacco: Never Used  . Alcohol Use: 3.0 oz/week    5 Glasses of wine per week     Comment: glass of wine daily     Current outpatient prescriptions:  .  apixaban (ELIQUIS) 5 MG TABS tablet, Take 1 tablet (5 mg total) by mouth 2 (two) times daily., Disp: 180 tablet, Rfl: 3 .  Cholecalciferol (VITAMIN D-1000 MAX ST) 1000 UNITS tablet, Take by mouth., Disp: , Rfl:  .  FLUoxetine (PROZAC) 40 MG capsule, Take 40 mg by mouth daily., Disp: , Rfl:  .  levothyroxine (SYNTHROID, LEVOTHROID) 112 MCG tablet, Take 112 mcg by mouth daily before breakfast., Disp: , Rfl:  .  metoprolol tartrate (LOPRESSOR) 25 MG tablet, Take 25 mg by mouth 2 (two) times daily., Disp: , Rfl:  .  omeprazole (PRILOSEC) 20 MG capsule, Take 20 mg by mouth daily., Disp: , Rfl:  .  simvastatin (ZOCOR) 40 MG tablet, , Disp: , Rfl:  .  metoprolol tartrate (LOPRESSOR) 25 MG tablet, Take 1 tablet (25 mg total) by mouth 2 (two) times daily. (Patient not taking: Reported on 06/24/2015), Disp: 180 tablet, Rfl: 3  Allergies  Allergen Reactions  . Sulfa Antibiotics Other (See Comments)    Review of Systems  Constitutional: Negative.  Negative for fever, chills and weight loss.  HENT: Negative.   Eyes: Negative.  Negative for blurred vision and  double vision.  Respiratory: Negative.  Negative for cough, shortness of breath and wheezing.   Cardiovascular: Negative.  Negative for chest pain, palpitations, orthopnea and leg swelling.  Gastrointestinal: Negative.  Negative for heartburn, abdominal pain and blood in stool.  Genitourinary: Negative.  Negative for dysuria, frequency and hematuria.  Musculoskeletal: Positive for joint pain (R 1st fibnger IP jointg with rfedness, pain, swelling.).  Skin: Negative.  Negative for rash.  Neurological: Negative.  Negative for weakness and headaches.  Psychiatric/Behavioral: Negative.       Objective  Filed Vitals:   06/24/15 1554  BP: 124/69  Pulse: 54  Resp: 16  Height: 5' 3"  (1.6 m)  Weight: 141 lb (63.957 kg)     Physical Exam  Constitutional: She is well-developed, well-nourished, and in no distress. No distress.  HENT:  Head: Normocephalic and atraumatic.  Neck: No thyromegaly present.  Cardiovascular: Normal rate and regular rhythm.   Pulmonary/Chest: Effort normal and breath sounds normal.  Musculoskeletal: She exhibits tenderness (R. thumb).  Small hematoma beneath proximal nail of R. Thumb  With some redness ans swelling of soft tissue of proximal nail.  Minimal pain to palp.  Minimal heat.  Skin: No erythema (R distal thumb).  Psychiatric: Mood, memory, affect and judgment normal.  Vitals reviewed.    Recent Results (from the past 2160 hour(s))  CBC with Differential/Platelet     Status: Abnormal   Collection Time: 03/28/15  5:56 PM  Result Value Ref Range   WBC 10.6 3.6-11.0 x10 3/mm 3   RBC 4.66 3.80-5.20 X10 6/mm 3   HGB 13.7 12.0-16.0 g/dL   HCT 42.0 35.0-47.0 %   MCV 90 80-100 fL   MCH 29.4 26.0-34.0 pg   MCHC 32.6 32.0-36.0 g/dL   RDW 13.7 11.5-14.5 %   Platelet 280 150-440 x10 3/mm 3   Neutrophil % 45.6 %   Lymphocyte % 42.8 %   Monocyte % 8.2 %   Eosinophil % 2.3 %   Basophil % 1.1 %   Neutrophil # 4.8 1.4-6.5 x10 3/mm 3   Lymphocyte # 4.5 (H)  1.0-3.6 x10 3/mm 3   Monocyte # 0.9 0.2-0.9 x10 3/mm    Eosinophil # 0.2 0.0-0.7 x10 3/mm 3   Basophil # 0.1 0.0-0.1 x10 3/mm 3  Basic metabolic panel     Status: Abnormal   Collection Time: 03/28/15  5:56 PM  Result Value Ref Range   Glucose, CSF DNP mg/dL    Comment: 65-99 NOTE: New Reference Range  03/05/15    BUN 13 mg/dL    Comment: 6-20 NOTE: New Reference Range  03/05/15    Creatinine 0.83 mg/dL    Comment: 0.44-1.00 NOTE: New Reference Range  03/05/15    Sodium, Urine Random DNP mmol/L    Comment: 135-145 NOTE: New Reference Range  03/05/15    Potassium, Urine Random DNP mmol/L    Comment: 3.5-5.1 NOTE: New Reference Range  03/05/15    Chloride, Urine Random DNP mmol/L    Comment: 101-111 NOTE: New Reference Range  03/05/15    Co2 23 mmol/L    Comment: 22-32 NOTE: New Reference Range  03/05/15    Calcium, Total 9.3 mg/dL    Comment: 8.9-10.3 NOTE: New Reference Range  03/05/15    Anion Gap 12 7-16   EGFR (African American) >60    EGFR (Non-African Amer.) >60     Comment: eGFR values <80m/min/1.73 m2 may be an indication of chronic kidney disease (CKD). Calculated eGFR is useful in patients with stable renal function. The eGFR calculation will not be reliable in acutely ill patients when serum creatinine is changing rapidly. It is not useful in patients on dialysis. The eGFR calculation may not be applicable to patients at the low and high extremes of body sizes, pregnant women, and vegetarians.    Glucose 111 (H) mg/dL    Comment: 65-99 NOTE: New Reference Range  03/05/15    Sodium 137 mmol/L    Comment: 135-145 NOTE: New Reference Range  03/05/15    Potassium 3.7 mmol/L    Comment: 3.5-5.1 NOTE: New Reference Range  03/05/15    Chloride 102 mmol/L    Comment: 101-111 NOTE: New Reference Range  03/05/15   Troponin I     Status: None   Collection Time: 03/28/15  5:56 PM  Result Value Ref Range   Troponin-I <0.03 ng/mL     Comment: 0.00-0.03 0.03 ng/mL or less: NEGATIVE  Repeat testing in 3-6 hrs  if clinically indicated. >0.05 ng/mL: POTENTIAL  MYOCARDIAL INJURY. Repeat  testing in 3-6 hrs if  clinically indicated. NOTE: An increase or decrease  of 30% or more on serial  testing suggests a  clinically important change NOTE: New Reference Range  03/05/15   Magnesium     Status: None   Collection Time: 03/28/15  5:56 PM  Result Value Ref Range   Magnesium 2.0 mg/dL    Comment: 1.7-2.4 THERAPEUTIC RANGE: 4-7 mg/dL TOXIC: > 10 mg/dL  ----------------------- NOTE: New Reference Range  03/05/15   TSH     Status: Abnormal   Collection Time: 03/28/15  5:56 PM  Result Value Ref Range   Thyroid Stimulating Horm 0.262 (L) uIU/mL    Comment: 0.350-4.500 NOTE: New Reference Range  03/05/15   T4, free     Status: Abnormal   Collection Time: 03/28/15  5:56 PM  Result Value Ref Range   Free Thyroxine 1.63 (H) ng/dL    Comment: 0.61-1.12 NOTE: New Reference Range:  03/05/15   Troponin I     Status: None   Collection Time: 03/28/15 11:00 PM  Result Value Ref Range   Troponin-I <0.03 ng/mL    Comment: 0.00-0.03 0.03 ng/mL or less: NEGATIVE  Repeat testing in 3-6 hrs  if clinically indicated. >0.05 ng/mL: POTENTIAL  MYOCARDIAL INJURY. Repeat  testing in 3-6 hrs if  clinically indicated. NOTE: An increase or decrease  of 30% or more on serial  testing suggests a  clinically important change NOTE: New Reference Range  03/05/15   Troponin I     Status: None   Collection Time: 03/29/15  1:55 AM  Result Value Ref Range   Troponin-I <0.03 ng/mL    Comment: 0.00-0.03 0.03 ng/mL or less: NEGATIVE  Repeat testing in 3-6 hrs  if clinically indicated. >0.05 ng/mL: POTENTIAL  MYOCARDIAL INJURY. Repeat  testing in 3-6 hrs if  clinically indicated. NOTE: An increase or decrease  of 30% or more on serial  testing suggests a  clinically important change NOTE: New Reference Range  03/05/15    CBC with Differential/Platelet     Status: None   Collection Time: 03/29/15  5:01 AM  Result Value Ref Range   WBC 8.2 3.6-11.0 x10 3/mm 3   RBC 4.06 3.80-5.20 X10 6/mm 3   HGB 12.2 12.0-16.0 g/dL   HCT 36.6 35.0-47.0 %   MCV 90 80-100 fL   MCH 30.0 26.0-34.0 pg   MCHC 33.3 32.0-36.0 g/dL   RDW 13.9 11.5-14.5 %   Platelet 237 150-440 x10 3/mm 3   Neutrophil % 52.6 %   Lymphocyte % 36.3 %   Monocyte % 7.7 %   Eosinophil % 2.6 %   Basophil % 0.8 %   Neutrophil # 4.3 1.4-6.5 x10 3/mm 3   Lymphocyte # 3.0 1.0-3.6 x10 3/mm 3   Monocyte # 0.6 0.2-0.9 x10 3/mm    Eosinophil # 0.2 0.0-0.7 x10 3/mm 3   Basophil # 0.1 0.0-0.1 x10 3/mm 3  Comprehensive metabolic panel     Status: Abnormal   Collection Time: 03/29/15  5:01 AM  Result Value Ref Range   Glucose, CSF DNP mg/dL    Comment: 65-99 NOTE: New Reference Range  03/05/15    BUN 10 mg/dL    Comment: 6-20 NOTE: New Reference Range  03/05/15    Creatinine 0.67 mg/dL    Comment: 0.44-1.00 NOTE: New Reference Range  03/05/15    Sodium, Urine Random DNP mmol/L    Comment: 135-145 NOTE: New Reference Range  03/05/15    Potassium, Urine Random DNP mmol/L    Comment: 3.5-5.1 NOTE: New Reference Range  03/05/15    Chloride, Urine Random DNP mmol/L    Comment: 101-111 NOTE: New Reference Range  03/05/15    Co2 23 mmol/L    Comment: 22-32 NOTE: New Reference Range  03/05/15    Calcium, Total 8.5 (L) mg/dL    Comment: 8.9-10.3 NOTE: New Reference Range  03/05/15    SGOT(AST) 17 U/L    Comment: 15-41 NOTE: New Reference Range  03/05/15    SGPT (ALT) 12 (L) U/L    Comment: 14-54 NOTE: New Reference Range  03/05/15    Alkaline Phosphatase 51 U/L    Comment: 38-126 NOTE: New Reference Range  03/05/15    Albumin 3.3 (L) g/dL    Comment: 3.5-5.0 NOTE: New reference range  03/05/15    Total Protein 5.8 (L) g/dL    Comment: 6.5-8.1 NOTE: New Reference Range  03/05/15    Bilirubin,Total 0.7 mg/dL     Comment: 0.3-1.2 NOTE: New Reference Range  03/05/15    Anion Gap 8 7-16   EGFR (African American) >60    EGFR (Non-African Amer.) >60     Comment: eGFR values <51m/min/1.73 m2 may be an indication of chronic kidney disease (CKD). Calculated eGFR is useful in patients with stable renal function. The eGFR calculation will not be reliable in acutely ill patients when serum creatinine is changing rapidly. It is not useful in patients on dialysis. The eGFR calculation may not be applicable to patients at the low and high extremes of body sizes, pregnant women, and vegetarians.    Glucose 110 (H) mg/dL    Comment: 65-99 NOTE: New Reference Range  03/05/15    Sodium 141 mmol/L    Comment: 135-145 NOTE: New Reference Range  03/05/15    Potassium 3.8 mmol/L    Comment: 3.5-5.1 NOTE: New Reference Range  03/05/15    Chloride 110 mmol/L    Comment: 101-111 NOTE: New Reference Range  03/05/15   Hemoglobin A1c     Status: None   Collection Time: 03/29/15  5:01 AM  Result Value Ref Range   Hemoglobin A1C 5.6 %    Comment: 4.0-6.0 NOTE: New Reference Range  03/05/15   Heparin level (unfractionated)     Status: Abnormal   Collection Time: 03/29/15  5:01 AM  Result Value Ref Range   Anti-Xa(Unfractionated) 0.96 (H) 0.30-0.70 IU/mL    Comment: The pharmacy will adjust heparin dosing based on clinical indication, desired goal and anti Xa levels as per policy MRUE-454-09 If the Xa levels are below expected values and the patient dosage has been confirmed, follow up testing of Antithrombin III levels should be considered.      Assessment & Plan  1. Traumatic hematoma of finger, initial encounter

## 2015-06-24 NOTE — Patient Instructions (Signed)
Soak R. Thumb in  hot water 4-5 times a day.

## 2015-09-30 ENCOUNTER — Encounter: Payer: Self-pay | Admitting: Family Medicine

## 2015-09-30 ENCOUNTER — Ambulatory Visit (INDEPENDENT_AMBULATORY_CARE_PROVIDER_SITE_OTHER): Payer: Medicare PPO | Admitting: Family Medicine

## 2015-09-30 VITALS — BP 139/71 | HR 56 | Temp 98.2°F | Resp 16 | Ht 63.0 in | Wt 141.0 lb

## 2015-09-30 DIAGNOSIS — E039 Hypothyroidism, unspecified: Secondary | ICD-10-CM

## 2015-09-30 DIAGNOSIS — Z23 Encounter for immunization: Secondary | ICD-10-CM

## 2015-09-30 DIAGNOSIS — I4891 Unspecified atrial fibrillation: Secondary | ICD-10-CM | POA: Diagnosis not present

## 2015-09-30 DIAGNOSIS — S6992XD Unspecified injury of left wrist, hand and finger(s), subsequent encounter: Secondary | ICD-10-CM | POA: Diagnosis not present

## 2015-09-30 DIAGNOSIS — F32A Depression, unspecified: Secondary | ICD-10-CM

## 2015-09-30 DIAGNOSIS — R5382 Chronic fatigue, unspecified: Secondary | ICD-10-CM

## 2015-09-30 DIAGNOSIS — F329 Major depressive disorder, single episode, unspecified: Secondary | ICD-10-CM

## 2015-09-30 NOTE — Patient Instructions (Signed)
Reassured re: her thumb.  Cont. Her current meds.

## 2015-09-30 NOTE — Progress Notes (Signed)
Name: Stacey Duncan   MRN: [id number]    DOB: [birth year]   Date:09/30/2015       Progress Note  Subjective  Chief Complaint  Chief Complaint  Patient presents with  . Hand Pain     feels better    HPI Here to f/u injure R thumb.  Still swollen, but no pain and function is good.    She is taking all of her routine meds.  No other c/o.  Her fatigue is at least unchanged.  Depression stable.  Her atr. Fib is stable.  No problem-specific assessment & plan notes found for this encounter.   Past Medical History  Diagnosis Date  . PAF (paroxysmal atrial fibrillation) (HCC)     a. on Eliquis;  . HTN (hypertension)   . GERD (gastroesophageal reflux disease)   . Hypothyroidism   . Osteoarthritis     a. bilateral knees  . Fibromyalgia   . Hypothyroidism     Social History  Substance Use Topics  . Smoking status: Never Smoker   . Smokeless tobacco: Never Used  . Alcohol Use: 3.0 oz/week    5 Glasses of wine per week     Comment: glass of wine daily     Current outpatient prescriptions:  .  apixaban (ELIQUIS) 5 MG TABS tablet, Take 1 tablet (5 mg total) by mouth 2 (two) times daily., Disp: 180 tablet, Rfl: 3 .  Cholecalciferol (VITAMIN D-1000 MAX ST) 1000 UNITS tablet, Take by mouth., Disp: , Rfl:  .  FLUoxetine (PROZAC) 40 MG capsule, Take 40 mg by mouth daily., Disp: , Rfl:  .  levothyroxine (SYNTHROID, LEVOTHROID) 100 MCG tablet, Take 1 tablet by mouth daily., Disp: , Rfl:  .  metoprolol tartrate (LOPRESSOR) 25 MG tablet, Take 1 tablet (25 mg total) by mouth 2 (two) times daily. (Patient taking differently: Take 12.5 mg by mouth 2 (two) times daily. ), Disp: 180 tablet, Rfl: 3 .  omeprazole (PRILOSEC) 20 MG capsule, Take 20 mg by mouth daily., Disp: , Rfl:  .  simvastatin (ZOCOR) 40 MG tablet, , Disp: , Rfl:   Allergies  Allergen Reactions  . Sulfa Antibiotics Other (See Comments)    Review of Systems  Constitutional: Positive for malaise/fatigue. Negative for  fever, chills and weight loss.  HENT: Negative for congestion and hearing loss.   Eyes: Negative for blurred vision and double vision.  Respiratory: Negative for cough, shortness of breath and wheezing.   Cardiovascular: Negative for chest pain, palpitations, orthopnea and leg swelling.  Gastrointestinal: Negative for heartburn, abdominal pain and blood in stool.  Genitourinary: Negative for dysuria, urgency and frequency.  Neurological: Positive for weakness. Negative for dizziness, sensory change, focal weakness and headaches.      Objective  Filed Vitals:   09/30/15 1330  BP: 139/71  Pulse: 53  Temp: 98.2 F (36.8 C)  TempSrc: Oral  Resp: 16  Height:  (1.6 m)  Weight: 141 lb (63.957 kg)     Physical Exam  Constitutional: She is oriented to person, place, and time and well-developed, well-nourished, and in no distress. No distress.  HENT:  Head: Normocephalic and atraumatic.  Eyes: Conjunctivae and EOM are normal. Pupils are equal, round, and reactive to light. No scleral icterus.  Neck: Normal range of motion. Neck supple. Carotid bruit is not present. No thyromegaly present.  Cardiovascular: Regular rhythm, normal heart sounds and intact distal pulses.  Bradycardia present.  Exam reveals no gallop and no friction rub.  No murmur heard. Pulmonary/Chest: No respiratory distress. She has no wheezes. She has no rales.  Abdominal: Soft. Bowel sounds are normal. She exhibits no distension and no abdominal bruit. There is no tenderness. There is no rebound.  Musculoskeletal: She exhibits no edema.  R thumb with mild swelling at distal IP joint , but with no pain and normal function.  Lymphadenopathy:    She has no cervical adenopathy.  Neurological: She is alert and oriented to person, place, and time.      No results found for this or any previous visit (from the past 2160 hour(s)).   Assessment & Plan  1. Depression   2. Chronic fatigue  - Comprehensive  Metabolic Panel (CMET) - CBC with Differential  3. Hypothyroidism, unspecified hypothyroidism type  - CBC with Differential - TSH  4. Atrial fibrillation, unspecified  - Comprehensive Metabolic Panel (CMET) - Lipid Profile  5. Need for influenza vaccination  - Flu vaccine HIGH DOSE PF (Fluzone High dose)  6. Finger injury, left, subsequent encounter

## 2015-10-02 DIAGNOSIS — E039 Hypothyroidism, unspecified: Secondary | ICD-10-CM | POA: Diagnosis not present

## 2015-10-02 DIAGNOSIS — I4891 Unspecified atrial fibrillation: Secondary | ICD-10-CM | POA: Diagnosis not present

## 2015-10-02 DIAGNOSIS — R739 Hyperglycemia, unspecified: Secondary | ICD-10-CM | POA: Diagnosis not present

## 2015-10-02 DIAGNOSIS — R5382 Chronic fatigue, unspecified: Secondary | ICD-10-CM | POA: Diagnosis not present

## 2015-10-02 LAB — CBC WITH DIFFERENTIAL/PLATELET
BASOS ABS: 0 10*3/uL (ref 0.0–0.2)
Basos: 1 %
EOS (ABSOLUTE): 0.2 10*3/uL (ref 0.0–0.4)
Eos: 3 %
HEMATOCRIT: 39.9 % (ref 34.0–46.6)
Hemoglobin: 13.4 g/dL (ref 11.1–15.9)
IMMATURE GRANS (ABS): 0 10*3/uL (ref 0.0–0.1)
Immature Granulocytes: 0 %
LYMPHS: 40 %
Lymphocytes Absolute: 3 10*3/uL (ref 0.7–3.1)
MCH: 30 pg (ref 26.6–33.0)
MCHC: 33.6 g/dL (ref 31.5–35.7)
MCV: 89 fL (ref 79–97)
Monocytes Absolute: 0.6 10*3/uL (ref 0.1–0.9)
Monocytes: 7 %
Neutrophils Absolute: 3.7 10*3/uL (ref 1.4–7.0)
Neutrophils: 49 %
Platelets: 294 10*3/uL (ref 150–379)
RBC: 4.47 x10E6/uL (ref 3.77–5.28)
RDW: 14.6 % (ref 12.3–15.4)
WBC: 7.5 10*3/uL (ref 3.4–10.8)

## 2015-10-03 LAB — COMPREHENSIVE METABOLIC PANEL
A/G RATIO: 1.5 (ref 1.1–2.5)
ALK PHOS: 70 IU/L (ref 39–117)
ALT: 16 IU/L (ref 0–32)
AST: 19 IU/L (ref 0–40)
Albumin: 4.1 g/dL (ref 3.5–4.7)
BUN / CREAT RATIO: 10 — AB (ref 11–26)
BUN: 8 mg/dL (ref 8–27)
Bilirubin Total: 0.5 mg/dL (ref 0.0–1.2)
CALCIUM: 9.4 mg/dL (ref 8.7–10.3)
CO2: 26 mmol/L (ref 18–29)
Chloride: 100 mmol/L (ref 97–108)
Creatinine, Ser: 0.82 mg/dL (ref 0.57–1.00)
GFR calc Af Amer: 78 mL/min/{1.73_m2} (ref >59)
GFR, EST NON AFRICAN AMERICAN: 67 mL/min/{1.73_m2} (ref >59)
GLOBULIN, TOTAL: 2.7 g/dL (ref 1.5–4.5)
Glucose: 109 mg/dL — ABNORMAL HIGH (ref 65–99)
Potassium: 5 mmol/L (ref 3.5–5.2)
SODIUM: 140 mmol/L (ref 134–144)
Total Protein: 6.8 g/dL (ref 6.0–8.5)

## 2015-10-03 LAB — LIPID PANEL
CHOL/HDL RATIO: 3.7 ratio (ref 0.0–4.4)
Cholesterol, Total: 189 mg/dL (ref 100–199)
HDL: 51 mg/dL (ref >39)
LDL Calculated: 116 mg/dL — ABNORMAL HIGH (ref 0–99)
Triglycerides: 108 mg/dL (ref 0–149)
VLDL Cholesterol Cal: 22 mg/dL (ref 5–40)

## 2015-10-03 LAB — TSH: TSH: 0.756 u[IU]/mL (ref 0.450–4.500)

## 2015-10-03 NOTE — Progress Notes (Signed)
Added A1C and went over results and will call with results [facility]

## 2015-10-03 NOTE — Progress Notes (Signed)
Advised.JH  

## 2015-10-04 LAB — HGB A1C W/O EAG: Hgb A1c MFr Bld: 6.1 % — ABNORMAL HIGH (ref 4.8–5.6)

## 2015-10-04 LAB — SPECIMEN STATUS REPORT

## 2015-10-14 ENCOUNTER — Telehealth: Payer: Self-pay | Admitting: Family Medicine

## 2015-10-14 MED ORDER — FLUOXETINE HCL 40 MG PO CAPS: 40.0000 mg | ORAL_CAPSULE | Freq: Every day | ORAL | Status: DC

## 2015-10-14 NOTE — Telephone Encounter (Signed)
Please review this and [facility] me what to do.[facility]

## 2015-10-14 NOTE — Telephone Encounter (Signed)
OK to [facility] pharmacy [facility]., #15.  To take 1 capsule daily.

## 2015-10-14 NOTE — Telephone Encounter (Signed)
[place].  She called [employer] and was given an override to get more medication.  She needs about 12 to 14 40 mg tablets [facility] [address] [place] [id number].   She doesn't need a whole prescription because she just had it filled.  Her call back number is [phone]

## 2015-10-14 NOTE — Telephone Encounter (Signed)
Done.[place] 

## 2015-10-25 ENCOUNTER — Ambulatory Visit: Payer: Medicare PPO | Admitting: Cardiovascular Disease

## 2015-11-04 ENCOUNTER — Other Ambulatory Visit: Payer: Self-pay | Admitting: Family Medicine

## 2015-11-18 ENCOUNTER — Ambulatory Visit: Payer: Medicare PPO | Admitting: Cardiovascular Disease

## 2015-11-29 ENCOUNTER — Ambulatory Visit: Payer: Medicare PPO | Admitting: Nurse Practitioner

## 2015-12-16 ENCOUNTER — Ambulatory Visit (INDEPENDENT_AMBULATORY_CARE_PROVIDER_SITE_OTHER): Payer: Medicare PPO | Admitting: Family Medicine

## 2015-12-16 VITALS — BP 115/75 | HR 59 | Temp 98.0°F | Resp 16 | Ht 63.0 in | Wt 140.0 lb

## 2015-12-16 DIAGNOSIS — M797 Fibromyalgia: Secondary | ICD-10-CM | POA: Diagnosis not present

## 2015-12-16 DIAGNOSIS — J0100 Acute maxillary sinusitis, unspecified: Secondary | ICD-10-CM | POA: Diagnosis not present

## 2015-12-16 DIAGNOSIS — J069 Acute upper respiratory infection, unspecified: Secondary | ICD-10-CM

## 2015-12-16 DIAGNOSIS — I4891 Unspecified atrial fibrillation: Secondary | ICD-10-CM | POA: Diagnosis not present

## 2015-12-16 MED ORDER — DM-GUAIFENESIN ER 30-600 MG PO TB12: 1.0000 | ORAL_TABLET | Freq: Two times a day (BID) | ORAL | Status: DC

## 2015-12-16 MED ORDER — AMOXICILLIN-POT CLAVULANATE 875-125 MG PO TABS: 1.0000 | ORAL_TABLET | Freq: Two times a day (BID) | ORAL | Status: DC

## 2015-12-16 MED ORDER — BENZONATATE 100 MG PO CAPS: 100.0000 mg | ORAL_CAPSULE | Freq: Three times a day (TID) | ORAL | Status: DC | PRN

## 2015-12-16 MED ORDER — MAGNESIUM OXIDE 250 MG PO TABS: 1.0000 | ORAL_TABLET | Freq: Every day | ORAL | Status: DC

## 2015-12-16 NOTE — Addendum Note (Signed)
Addended byLaroy Apple: Aarini Slee L on: 12/16/2015 04:06 PM   Modules accepted: Orders

## 2015-12-16 NOTE — Progress Notes (Signed)
Subjective:    Patient ID: Stacey Duncan, female    DOB: 1931/02/03, 81 y.o.   MRN: [id number]  HPI: Stacey Duncan is a 81 y.o. female presenting on 12/16/2015 for Sinusitis   HPI  Pt presents for sinus pain and congestion.  Coughing so much her ribs hurt. Symptoms began last Thursday with nasal congestion. Tried saline OTC with no relief. Using afrin OTC- helping with congestion. Coughing- not productive. Runny nose- slighly yellow. No HA. No sinus pressure. No chest tightness or shortness of breath.   Pt would also like to talk about pain medication for her fibromyalgia. She is no longer able to take anti-inflammatories due to her blood thinners 2/2 afib. Tylenol is not helping with pain. Tried lyrica in the past but it upset her stomach. Pain is mild but occurring everyday and is bothersome.   Past Medical History  Diagnosis Date  . PAF (paroxysmal atrial fibrillation) (HCC)     a. on Eliquis;  . HTN (hypertension)   . GERD (gastroesophageal reflux disease)   . Hypothyroidism   . Osteoarthritis     a. bilateral knees  . Fibromyalgia   . Hypothyroidism     Current Outpatient Prescriptions on File Prior to Visit  Medication Sig  . apixaban (ELIQUIS) 5 MG TABS tablet Take 1 tablet (5 mg total) by mouth 2 (two) times daily.  . Cholecalciferol (VITAMIN D-1000 MAX ST) 1000 UNITS tablet Take by mouth.  Marland Kitchen FLUoxetine (PROZAC) 40 MG capsule TAKE ONE CAPSULE BY MOUTH EVERY DAY  . levothyroxine (SYNTHROID, LEVOTHROID) 100 MCG tablet Take 1 tablet by mouth daily.  . metoprolol tartrate (LOPRESSOR) 25 MG tablet Take 1 tablet (25 mg total) by mouth 2 (two) times daily. (Patient taking differently: Take 12.5 mg by mouth 2 (two) times daily. )  . omeprazole (PRILOSEC) 20 MG capsule Take 20 mg by mouth daily.  . simvastatin (ZOCOR) 40 MG tablet TAKE 1 TABLET EVERY DAY   No current facility-administered medications on file prior to visit.    Review of Systems  Constitutional:  Negative for chills.  HENT: Positive for congestion, postnasal drip and sinus pressure. Negative for ear pain, facial swelling, sneezing and sore throat.   Respiratory: Positive for cough. Negative for chest tightness and wheezing.   Cardiovascular: Negative for chest pain and palpitations.  Gastrointestinal: Negative.  Negative for nausea, vomiting and diarrhea.  Musculoskeletal: Negative for neck pain.  Neurological: Positive for headaches.   Per HPI unless specifically indicated above     Objective:    BP 115/75 mmHg  Pulse 59  Temp(Src) 98 F (36.7 C) (Oral)  Resp 16  Ht  (1.6 m)  Wt 140 lb (63.504 kg)  BMI 24.81 kg/m2  SpO2 96%  Wt Readings from Last 3 Encounters:  12/16/15 140 lb (63.504 kg)  09/30/15 141 lb (63.957 kg)  06/24/15 141 lb (63.957 kg)    Physical Exam  Constitutional: She appears well-developed and well-nourished. No distress.  HENT:  Head: Normocephalic and atraumatic.  Right Ear: Hearing and tympanic membrane normal. Tympanic membrane is not erythematous and not bulging.  Left Ear: Hearing and tympanic membrane normal. Tympanic membrane is not erythematous and not bulging.  Nose: Mucosal edema and rhinorrhea present. No sinus tenderness. Right sinus exhibits maxillary sinus tenderness. Right sinus exhibits no frontal sinus tenderness. Left sinus exhibits maxillary sinus tenderness. Left sinus exhibits no frontal sinus tenderness.  Mouth/Throat: Uvula is midline and mucous membranes are normal. Posterior oropharyngeal erythema present.  Neck: Neck supple. No Brudzinski's sign and no Kernig's sign noted.  Cardiovascular: Normal rate, regular rhythm and normal heart sounds.   Pulmonary/Chest: Breath sounds normal. No accessory muscle usage. No tachypnea. No respiratory distress. She has no decreased breath sounds. She has no wheezes. She has no rhonchi. She has no rales. Chest wall is not dull to percussion. She exhibits no tenderness.  Lymphadenopathy:      She has no cervical adenopathy.   Results for orders placed or performed in visit on 09/30/15  Comprehensive Metabolic Panel (CMET)  Result Value Ref Range   Glucose 109 (H) 65 - 99 mg/dL   BUN 8 8 - 27 mg/dL   Creatinine, Ser 1.610.82 0.57 - 1.00 mg/dL   GFR calc non Af Amer 67 >59 mL/min/1.73   GFR calc Af Amer 78 >59 mL/min/1.73   BUN/Creatinine Ratio 10 (L) 11 - 26   Sodium 140 134 - 144 mmol/L   Potassium 5.0 3.5 - 5.2 mmol/L   Chloride 100 97 - 108 mmol/L   CO2 26 18 - 29 mmol/L   Calcium 9.4 8.7 - 10.3 mg/dL   Total Protein 6.8 6.0 - 8.5 g/dL   Albumin 4.1 3.5 - 4.7 g/dL   Globulin, Total 2.7 1.5 - 4.5 g/dL   Albumin/Globulin Ratio 1.5 1.1 - 2.5   Bilirubin Total 0.5 0.0 - 1.2 mg/dL   Alkaline Phosphatase 70 39 - 117 IU/L   AST 19 0 - 40 IU/L   ALT 16 0 - 32 IU/L  CBC with Differential  Result Value Ref Range   WBC 7.5 3.4 - 10.8 x10E3/uL   RBC 4.47 3.77 - 5.28 x10E6/uL   Hemoglobin 13.4 11.1 - 15.9 g/dL   Hematocrit 09.639.9 04.534.0 - 46.6 %   MCV 89 79 - 97 fL   MCH 30.0 26.6 - 33.0 pg   MCHC 33.6 31.5 - 35.7 g/dL   RDW 40.914.6 81.112.3 - 91.415.4 %   Platelets 294 150 - 379 x10E3/uL   Neutrophils 49 %   Lymphs 40 %   Monocytes 7 %   Eos 3 %   Basos 1 %   Neutrophils Absolute 3.7 1.4 - 7.0 x10E3/uL   Lymphocytes Absolute 3.0 0.7 - 3.1 x10E3/uL   Monocytes Absolute 0.6 0.1 - 0.9 x10E3/uL   EOS (ABSOLUTE) 0.2 0.0 - 0.4 x10E3/uL   Basophils Absolute 0.0 0.0 - 0.2 x10E3/uL   Immature Granulocytes 0 %   Immature Grans (Abs) 0.0 0.0 - 0.1 x10E3/uL  Lipid Profile  Result Value Ref Range   Cholesterol, Total 189 100 - 199 mg/dL   Triglycerides 782108 0 - 149 mg/dL   HDL 51 >95>39 mg/dL   VLDL Cholesterol Cal 22 5 - 40 mg/dL   LDL Calculated 621116 (H) 0 - 99 mg/dL   Chol/HDL Ratio 3.7 0.0 - 4.4 ratio units  TSH  Result Value Ref Range   TSH 0.756 0.450 - 4.500 uIU/mL  Hgb A1c w/o eAG  Result Value Ref Range   Hgb A1c MFr Bld 6.1 (H) 4.8 - 5.6 %  Specimen status report  Result  Value Ref Range   specimen status report Comment       Assessment & Plan:   Problem List Items Addressed This Visit      Cardiovascular and Mediastinum   Atrial fibrillation, unspecified - Primary     Musculoskeletal and Integument   Fibromyalgia    Encouraged pt to speak with PCP regarding medications changes for fibromyalgia. Encouraged daily  magnesium to help with fatigue.  Continue tylenol for pain. Consider cymbalta, gabapentin, or amitriptyline for pain.  I spent 20 minutes face to face discussing fibromyalgia treatment with this patient.       Relevant Medications   Magnesium Oxide 250 MG TABS    Other Visit Diagnoses    Acute non-recurrent maxillary sinusitis        Treat with augmentin for sinus infection. Supportive care at home. Pt cautioned to avoid oral decongestants due to arrhythmia. Afrin 3 days only. RTC if not imp    Relevant Medications    amoxicillin-clavulanate (AUGMENTIN) 875-125 MG tablet    Upper respiratory infection        Supportive care at home. Cough suppressants.     Relevant Medications    benzonatate (TESSALON) 100 MG capsule    dextromethorphan-guaiFENesin (MUCINEX DM) 30-600 MG 12hr tablet       Meds ordered this encounter  Medications  . DISCONTD: amoxicillin-clavulanate (AUGMENTIN) 875-125 MG tablet    Sig: Take 1 tablet by mouth 2 (two) times daily.    Dispense:  14 tablet    Refill:  0    Order Specific Question:  Supervising Provider    Answer:  Janeann Forehand [phone]  . DISCONTD: dextromethorphan-guaiFENesin (MUCINEX DM) 30-600 MG 12hr tablet    Sig: Take 1 tablet by mouth 2 (two) times daily.    Dispense:  20 tablet    Refill:  0    Order Specific Question:  Supervising Provider    Answer:  Janeann Forehand [phone]  . benzonatate (TESSALON) 100 MG capsule    Sig: Take 1 capsule (100 mg total) by mouth 3 (three) times daily as needed.    Dispense:  30 capsule    Refill:  0    Order Specific Question:  Supervising  Provider    Answer:  Janeann Forehand [[id number]]  . Magnesium Oxide 250 MG TABS    Sig: Take 1 tablet (250 mg total) by mouth daily.    Refill:  0    Order Specific Question:  Supervising Provider    Answer:  Janeann Forehand [[id number]]  . amoxicillin-clavulanate (AUGMENTIN) 875-125 MG tablet    Sig: Take 1 tablet by mouth 2 (two) times daily.    Dispense:  14 tablet    Refill:  0    Order Specific Question:  Supervising Provider    Answer:  Janeann Forehand [phone]  . dextromethorphan-guaiFENesin (MUCINEX DM) 30-600 MG 12hr tablet    Sig: Take 1 tablet by mouth 2 (two) times daily.    Dispense:  20 tablet    Refill:  0    Order Specific Question:  Supervising Provider    Answer:  Janeann Forehand [[id number]]      Follow up plan: No Follow-up on file.

## 2015-12-16 NOTE — Assessment & Plan Note (Addendum)
Encouraged pt to speak with PCP regarding medications changes for fibromyalgia. Encouraged daily magnesium to help with fatigue.  Continue tylenol for pain. Consider cymbalta, gabapentin, or amitriptyline for pain.  I spent 20 minutes face to face discussing fibromyalgia treatment with this patient.

## 2015-12-16 NOTE — Patient Instructions (Signed)
You can use supportive care at home to help with your symptoms. I have sent Mucinex DM to your pharmacy to help break up the congestion and soothe your cough. You can takes this twice daily.  I have also sent tesslon perles to your pharmacy to help with the cough- you can take these 3 times daily as needed. Honey is a natural cough suppressant- so add it to your tea in the morning.  If you have a humidifer, set that up in your bedroom at night.   Please seek immediate medical attention if you develop shortness of breath not relieve by inhaler, chest pain/tightness, fever > 103 F or other concerning symptoms.    Try magnesium sulfate OTC to see if this helps with your fibromyalgia symptoms- there are some studies that suggest this helps. Take 250-400mg  OTC once daily.

## 2015-12-27 ENCOUNTER — Telehealth: Payer: Self-pay | Admitting: Cardiovascular Disease

## 2015-12-27 NOTE — Telephone Encounter (Signed)
Patient going out of town for 2 weeks and pharmacy is unable to refill her Eliquis ahead of time.  Patient has a week supply and wants to know if safe to take half of the rx dosage until she can get refill.    Mandi has prepared samples Eliquis 5 mg po x 2 daily  for patient to pick up at office today before 5 pm .  Patient is aware they are ready.

## 2016-01-08 ENCOUNTER — Ambulatory Visit: Payer: Medicare PPO | Admitting: Nurse Practitioner

## 2016-01-31 ENCOUNTER — Encounter: Payer: Self-pay | Admitting: Cardiovascular Disease

## 2016-01-31 ENCOUNTER — Ambulatory Visit (INDEPENDENT_AMBULATORY_CARE_PROVIDER_SITE_OTHER): Payer: Medicare PPO | Admitting: Cardiovascular Disease

## 2016-01-31 VITALS — BP 130/62 | HR 57 | Ht 63.0 in | Wt 143.5 lb

## 2016-01-31 DIAGNOSIS — E785 Hyperlipidemia, unspecified: Secondary | ICD-10-CM | POA: Diagnosis not present

## 2016-01-31 DIAGNOSIS — I4891 Unspecified atrial fibrillation: Secondary | ICD-10-CM
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
## 2016-01-31 NOTE — Assessment & Plan Note (Signed)
Paroxysmal atrial fibrillation, she denies any recent symptoms. She is concerned about stroke and like to stay on anticoagulation.  No other changes to her medications

## 2016-01-31 NOTE — Patient Instructions (Addendum)
You are doing well. No medication changes were made.  Research CT coronary calcium score for risk assessment  Please call us if you have new issues that need to be addressed before your next appt.  Your physician wants you to follow-up in: 12 months.  You will receive a reminder letter in the mail two months in advance. If you don't receive a letter, please call our office to schedule the follow-up appointment.

## 2016-01-31 NOTE — Assessment & Plan Note (Signed)
Cholesterol is at goal on the current lipid regimen. No changes to the medications were made.  We did mention a risk assessment test such as CT coronary calcium score. She will research this and call us if she would like this ordered

## 2016-02-06 ENCOUNTER — Encounter: Payer: Self-pay | Admitting: Family Medicine

## 2016-02-06 ENCOUNTER — Ambulatory Visit (INDEPENDENT_AMBULATORY_CARE_PROVIDER_SITE_OTHER): Payer: Medicare PPO | Admitting: Family Medicine

## 2016-02-06 VITALS — BP 110/70 | HR 60 | Temp 97.9°F | Resp 16 | Ht 63.0 in | Wt 145.0 lb

## 2016-02-06 DIAGNOSIS — F329 Major depressive disorder, single episode, unspecified: Secondary | ICD-10-CM

## 2016-02-06 DIAGNOSIS — R5382 Chronic fatigue, unspecified: Secondary | ICD-10-CM | POA: Diagnosis not present

## 2016-02-06 DIAGNOSIS — E785 Hyperlipidemia, unspecified: Secondary | ICD-10-CM

## 2016-02-06 DIAGNOSIS — F32A Depression, unspecified: Secondary | ICD-10-CM

## 2016-02-06 DIAGNOSIS — E039 Hypothyroidism, unspecified: Secondary | ICD-10-CM

## 2016-02-06 DIAGNOSIS — I4891 Unspecified atrial fibrillation: Secondary | ICD-10-CM

## 2016-02-06 DIAGNOSIS — M797 Fibromyalgia: Secondary | ICD-10-CM

## 2016-02-06 NOTE — Progress Notes (Signed)
Name: Stacey Duncan   MRN: [id number]    DOB: [birth year]   Date:02/06/2016       Progress Note  Subjective  Chief Complaint  Chief Complaint  Patient presents with  . Follow-up  . Depression    HPI Here for f/u of depression and fibromyalgia.  Has HBP.  Atr. Fib.  In sinus rhythym now.  No problem-specific assessment & plan notes found for this encounter.   Past Medical History  Diagnosis Date  . PAF (paroxysmal atrial fibrillation) (HCC)     a. on Eliquis;  . HTN (hypertension)   . GERD (gastroesophageal reflux disease)   . Hypothyroidism   . Osteoarthritis     a. bilateral knees  . Fibromyalgia   . Hypothyroidism     Past Surgical History  Procedure Laterality Date  . Rotator cuff repair      left   . Abdominal hysterectomy    . Cholecystectomy      Family History  Problem Relation Age of Onset  . Heart disease Mother 71    CABG    Social History   Social History  . Marital Status: Married    Spouse Name: N/A  . Number of Children: N/A  . Years of Education: N/A   Occupational History  . Not on file.   Social History Main Topics  . Smoking status: Never Smoker   . Smokeless tobacco: Never Used  . Alcohol Use: 3.0 oz/week    5 Glasses of wine per week     Comment: glass of wine daily  . Drug Use: No  . Sexual Activity: Not on file   Other Topics Concern  . Not on file   Social History Narrative     Current outpatient prescriptions:  .  apixaban (ELIQUIS) 5 MG TABS tablet, Take 1 tablet (5 mg total) by mouth 2 (two) times daily., Disp: 180 tablet, Rfl: 3 .  Cholecalciferol (VITAMIN D-1000 MAX ST) 1000 UNITS tablet, Take 1,000 Units by mouth daily. , Disp: , Rfl:  .  FLUoxetine (PROZAC) 40 MG capsule, TAKE ONE CAPSULE BY MOUTH EVERY DAY, Disp: 30 capsule, Rfl: 6 .  levothyroxine (SYNTHROID, LEVOTHROID) 100 MCG tablet, Take 1 tablet by mouth daily., Disp: , Rfl:  .  metoprolol tartrate (LOPRESSOR) 25 MG tablet, Take 12.5 mg by mouth 2  (two) times daily., Disp: , Rfl:  .  omeprazole (PRILOSEC) 20 MG capsule, Take 20 mg by mouth daily., Disp: , Rfl:  .  simvastatin (ZOCOR) 40 MG tablet, TAKE 1 TABLET EVERY DAY, Disp: 90 tablet, Rfl: 3  Allergies  Allergen Reactions  . Sulfa Antibiotics Other (See Comments)     Review of Systems  Constitutional: Negative for fever, chills, weight loss and malaise/fatigue.  HENT: Negative for hearing loss.   Eyes: Negative for blurred vision and double vision.  Respiratory: Negative for cough, shortness of breath and wheezing.   Cardiovascular: Negative for chest pain, palpitations and leg swelling.  Gastrointestinal: Negative for heartburn, abdominal pain and blood in stool.  Genitourinary: Negative for dysuria, urgency and frequency. Flank pain: fiobromyalgia doing fairly well.  Musculoskeletal: Positive for myalgias.  Skin: Negative for rash.  Neurological: Negative for dizziness, tremors, weakness and headaches.  Psychiatric/Behavioral: Positive for depression (doing well). The patient is not nervous/anxious and does not have insomnia.       Objective  Filed Vitals:   02/06/16 1332  BP: 110/70  Pulse: 60  Temp: 97.9 F (36.6 C)  TempSrc: Oral  Resp: 16  Height:  (1.6 m)  Weight: 145 lb (65.772 kg)    Physical Exam  Constitutional: She is oriented to person, place, and time and well-developed, well-nourished, and in no distress. No distress.  HENT:  Head: Normocephalic and atraumatic.  Eyes: Conjunctivae and EOM are normal. Pupils are equal, round, and reactive to light. No scleral icterus.  Neck: Normal range of motion. Neck supple. Carotid bruit is not present. No thyromegaly present.  Cardiovascular: Normal rate, regular rhythm and normal heart sounds.  Exam reveals no gallop and no friction rub.   No murmur heard. Pulmonary/Chest: Effort normal and breath sounds normal. No respiratory distress. She has no wheezes. She has no rales.  Musculoskeletal: She  exhibits no edema.  Lymphadenopathy:    She has no cervical adenopathy.  Neurological: She is alert and oriented to person, place, and time.  Vitals reviewed.      No results found for this or any previous visit (from the past 2160 hour(s)).   Assessment & Plan  Problem List Items Addressed This Visit      Cardiovascular and Mediastinum   Atrial fibrillation, unspecified     Endocrine   Thyroid activity decreased     Musculoskeletal and Integument   Fibromyalgia     Other   Chronic fatigue   Depression - Primary   Hyperlipidemia      No orders of the defined types were placed in this encounter.   1. Depression Cont Prozac  2. Chronic fatigue   3. Hypothyroidism, unspecified hypothyroidism type  Cont. Levothyroxine. 4. Atrial fibrillation, unspecified Cont. Metoprolol Cont. Xarelto 5. Fibromyalgia   6. Hyperlipidemia Cont meds

## 2016-02-27 ENCOUNTER — Other Ambulatory Visit: Payer: Self-pay

## 2016-02-27 DIAGNOSIS — Z1231 Encounter for screening mammogram for malignant neoplasm of breast: Secondary | ICD-10-CM

## 2016-03-10 ENCOUNTER — Other Ambulatory Visit: Payer: Self-pay | Admitting: Family Medicine

## 2016-03-12 ENCOUNTER — Encounter: Payer: Self-pay | Admitting: Family Medicine

## 2016-03-12 ENCOUNTER — Telehealth: Payer: Self-pay | Admitting: Family Medicine

## 2016-03-12 ENCOUNTER — Ambulatory Visit (INDEPENDENT_AMBULATORY_CARE_PROVIDER_SITE_OTHER): Payer: Medicare PPO | Admitting: Family Medicine

## 2016-03-12 VITALS — BP 141/82 | HR 71 | Temp 98.3°F | Resp 16 | Ht 63.0 in | Wt 143.0 lb

## 2016-03-12 DIAGNOSIS — R399 Unspecified symptoms and signs involving the genitourinary system: Secondary | ICD-10-CM | POA: Diagnosis not present

## 2016-03-12 DIAGNOSIS — N390 Urinary tract infection, site not specified: Secondary | ICD-10-CM | POA: Diagnosis not present

## 2016-03-12 MED ORDER — PHENAZOPYRIDINE HCL 100 MG PO TABS: 100.0000 mg | ORAL_TABLET | Freq: Three times a day (TID) | ORAL | Status: DC | PRN

## 2016-03-12 MED ORDER — CIPROFLOXACIN HCL 500 MG PO TABS: 500.0000 mg | ORAL_TABLET | Freq: Two times a day (BID) | ORAL | Status: AC

## 2016-03-12 NOTE — Telephone Encounter (Signed)
Scheduled

## 2016-03-12 NOTE — Progress Notes (Signed)
Name: Stacey Duncan   MRN: [id number]    DOB: 12-05-1931   Date:03/12/2016       Progress Note  Subjective  Chief Complaint  Chief Complaint  Patient presents with  . Abdominal Pain    back pain urgency but can't void urine onset yesterday    HPI  Patient c/o lower abdominal pain starting yesterday along with inability to empty bladder completely and  Dribbling.  N o N/V.  No D.  No Constipitation.  No fever.  She has tried and cannot get [place] a urine specimen for analysis.  No problem-specific assessment & plan notes found for this encounter.   Past Medical History  Diagnosis Date  . PAF (paroxysmal atrial fibrillation) (HCC)     a. on Eliquis;  . HTN (hypertension)   . GERD (gastroesophageal reflux disease)   . Hypothyroidism   . Osteoarthritis     a. bilateral knees  . Fibromyalgia   . Hypothyroidism     Social History  Substance Use Topics  . Smoking status: Never Smoker   . Smokeless tobacco: Never Used  . Alcohol Use: 3.0 oz/week    5 Glasses of wine per week     Comment: glass of wine daily     Current outpatient prescriptions:  .  apixaban (ELIQUIS) 5 MG TABS tablet, Take 1 tablet (5 mg total) by mouth 2 (two) times daily., Disp: 180 tablet, Rfl: 3 .  Cholecalciferol (VITAMIN D-1000 MAX ST) 1000 UNITS tablet, Take 1,000 Units by mouth daily. , Disp: , Rfl:  .  FLUoxetine (PROZAC) 40 MG capsule, TAKE 1 CAPSULE EVERY DAY, Disp: 90 capsule, Rfl: 3 .  levothyroxine (SYNTHROID, LEVOTHROID) 100 MCG tablet, Take 1 tablet by mouth daily., Disp: , Rfl:  .  metoprolol tartrate (LOPRESSOR) 25 MG tablet, Take 12.5 mg by mouth 2 (two) times daily., Disp: , Rfl:  .  omeprazole (PRILOSEC) 20 MG capsule, Take 20 mg by mouth daily., Disp: , Rfl:  .  simvastatin (ZOCOR) 40 MG tablet, TAKE 1 TABLET EVERY DAY, Disp: 90 tablet, Rfl: 3 .  ciprofloxacin (CIPRO) 500 MG tablet, Take 1 tablet (500 mg total) by mouth 2 (two) times daily., Disp: 14 tablet, Rfl: 0 .  phenazopyridine  (PYRIDIUM) 100 MG tablet, Take 1 tablet (100 mg total) by mouth 3 (three) times daily as needed for pain., Disp: 15 tablet, Rfl: 0  Allergies  Allergen Reactions  . Sulfa Antibiotics Other (See Comments)    Review of Systems  Constitutional: Negative for fever, chills, weight loss and malaise/fatigue.  HENT: Negative for hearing loss.   Eyes: Negative for blurred vision and double vision.  Respiratory: Negative for cough, shortness of breath and wheezing.   Cardiovascular: Negative for chest pain, palpitations and leg swelling.  Gastrointestinal: Positive for abdominal pain (lower abd.). Negative for heartburn, nausea, vomiting, diarrhea, constipation and blood in stool.  Genitourinary: Positive for urgency. Negative for dysuria and frequency.       Inability to empty bladder well and dribbling.  Skin: Negative for rash.  Neurological: Negative for weakness and headaches.      Objective  Filed Vitals:   03/12/16 1119  BP: 141/82  Pulse: 71  Temp: 98.3 F (36.8 C)  TempSrc: Oral  Resp: 16  Height:  (1.6 m)  Weight: 143 lb (64.864 kg)     Physical Exam  Constitutional: She is oriented to person, place, and time and well-developed, well-nourished, and in no distress. No distress.  HENT:  Head: Normocephalic and atraumatic.  Cardiovascular: Normal rate, regular rhythm and normal heart sounds.  Exam reveals no gallop and no friction rub.   No murmur heard. Pulmonary/Chest: Effort normal and breath sounds normal. No respiratory distress. She has no wheezes. She has no rales.  Abdominal: Soft. She exhibits no distension and no mass. There is tenderness (suprapubic and mild diffuse).  No CV A tenderness.  Neurological: She is alert and oriented to person, place, and time.  Vitals reviewed.     No results found for this or any previous visit (from the past 2160 hour(s)).   Assessment & Plan  1. Urinary tract infection without hematuria, site unspecified  - POCT  urinalysis dipstick-patient could not get specimen  2. UTI symptoms  - phenazopyridine (PYRIDIUM) 100 MG tablet; Take 1 tablet (100 mg total) by mouth 3 (three) times daily as needed for pain.  Dispense: 15 tablet; Refill: 0 - ciprofloxacin (CIPRO) 500 MG tablet; Take 1 tablet (500 mg total) by mouth 2 (two) times daily.  Dispense: 14 tablet; Refill: 0

## 2016-03-12 NOTE — Addendum Note (Signed)
Addended by: Elvina MattesPATEL, Jaja Switalski D on: 03/12/2016 12:27 PM   Modules accepted: Orders

## 2016-03-12 NOTE — Patient Instructions (Signed)
Go to [facility] for any worsening pain, fever, Nausea/vomiting, or inability to empty bladder.

## 2016-03-12 NOTE — Telephone Encounter (Signed)
Pt is having really bad lower stomach pain.  Please call [phone]

## 2016-03-13 ENCOUNTER — Encounter: Payer: Self-pay | Admitting: Emergency Medicine

## 2016-03-13 ENCOUNTER — Ambulatory Visit: Payer: Medicare PPO

## 2016-03-13 ENCOUNTER — Emergency Department: Payer: Medicare PPO

## 2016-03-13 ENCOUNTER — Inpatient Hospital Stay
Admission: EM | Admit: 2016-03-13 | Discharge: 2016-03-14 | DRG: 309 | Disposition: A | Discharge status: Home / Self Care | Payer: Medicare PPO | Attending: Internal Medicine | Admitting: Internal Medicine

## 2016-03-13 ENCOUNTER — Inpatient Hospital Stay (HOSPITAL_COMMUNITY)
Admit: 2016-03-13 | Discharge: 2016-03-13 | Disposition: A | Discharge status: Home / Self Care | Payer: Medicare PPO | Attending: Cardiovascular Disease | Admitting: Cardiovascular Disease

## 2016-03-13 DIAGNOSIS — I10 Essential (primary) hypertension: Secondary | ICD-10-CM | POA: Diagnosis not present

## 2016-03-13 DIAGNOSIS — N390 Urinary tract infection, site not specified: Secondary | ICD-10-CM | POA: Diagnosis present

## 2016-03-13 DIAGNOSIS — Z7901 Long term (current) use of anticoagulants: Secondary | ICD-10-CM

## 2016-03-13 DIAGNOSIS — M170 Bilateral primary osteoarthritis of knee: Secondary | ICD-10-CM | POA: Diagnosis present

## 2016-03-13 DIAGNOSIS — E039 Hypothyroidism, unspecified: Secondary | ICD-10-CM | POA: Diagnosis not present

## 2016-03-13 DIAGNOSIS — R112 Nausea with vomiting, unspecified: Secondary | ICD-10-CM | POA: Diagnosis not present

## 2016-03-13 DIAGNOSIS — E785 Hyperlipidemia, unspecified: Secondary | ICD-10-CM | POA: Diagnosis not present

## 2016-03-13 DIAGNOSIS — I248 Other forms of acute ischemic heart disease: Secondary | ICD-10-CM | POA: Diagnosis not present

## 2016-03-13 DIAGNOSIS — I482 Chronic atrial fibrillation: Secondary | ICD-10-CM | POA: Diagnosis present

## 2016-03-13 DIAGNOSIS — K219 Gastro-esophageal reflux disease without esophagitis: Secondary | ICD-10-CM | POA: Diagnosis present

## 2016-03-13 DIAGNOSIS — Z79899 Other long term (current) drug therapy: Secondary | ICD-10-CM | POA: Diagnosis not present

## 2016-03-13 DIAGNOSIS — R079 Chest pain, unspecified: Secondary | ICD-10-CM | POA: Diagnosis not present

## 2016-03-13 DIAGNOSIS — I480 Paroxysmal atrial fibrillation: Principal | ICD-10-CM | POA: Diagnosis present

## 2016-03-13 DIAGNOSIS — E876 Hypokalemia: Secondary | ICD-10-CM | POA: Diagnosis not present

## 2016-03-13 DIAGNOSIS — Z882 Allergy status to sulfonamides status: Secondary | ICD-10-CM | POA: Diagnosis not present

## 2016-03-13 DIAGNOSIS — I4891 Unspecified atrial fibrillation: Secondary | ICD-10-CM

## 2016-03-13 DIAGNOSIS — E871 Hypo-osmolality and hyponatremia: Secondary | ICD-10-CM | POA: Diagnosis not present

## 2016-03-13 DIAGNOSIS — R066 Hiccough: Secondary | ICD-10-CM | POA: Diagnosis present

## 2016-03-13 DIAGNOSIS — E860 Dehydration: Secondary | ICD-10-CM | POA: Diagnosis not present

## 2016-03-13 DIAGNOSIS — M797 Fibromyalgia: Secondary | ICD-10-CM | POA: Diagnosis present

## 2016-03-13 DIAGNOSIS — D72829 Elevated white blood cell count, unspecified: Secondary | ICD-10-CM | POA: Diagnosis not present

## 2016-03-13 LAB — COMPREHENSIVE METABOLIC PANEL
ALK PHOS: 59 U/L (ref 38–126)
ALT: 16 U/L (ref 14–54)
AST: 25 U/L (ref 15–41)
Albumin: 3.5 g/dL (ref 3.5–5.0)
Anion gap: 11 (ref 5–15)
BILIRUBIN TOTAL: 1.4 mg/dL — AB (ref 0.3–1.2)
BUN: 11 mg/dL (ref 6–20)
CHLORIDE: 102 mmol/L (ref 101–111)
CO2: 20 mmol/L — ABNORMAL LOW (ref 22–32)
CREATININE: 1.07 mg/dL — AB (ref 0.44–1.00)
Calcium: 8.7 mg/dL — ABNORMAL LOW (ref 8.9–10.3)
GFR calc Af Amer: 54 mL/min — ABNORMAL LOW (ref >60)
GFR, EST NON AFRICAN AMERICAN: 47 mL/min — AB (ref >60)
Glucose, Bld: 146 mg/dL — ABNORMAL HIGH (ref 65–99)
Potassium: 3.3 mmol/L — ABNORMAL LOW (ref 3.5–5.1)
Sodium: 133 mmol/L — ABNORMAL LOW (ref 135–145)
Total Protein: 6.8 g/dL (ref 6.5–8.1)

## 2016-03-13 LAB — URINALYSIS COMPLETE WITH MICROSCOPIC (ARMC ONLY)
Bacteria, UA: NONE SEEN
Bilirubin Urine: NEGATIVE
Glucose, UA: NEGATIVE mg/dL
Leukocytes, UA: NEGATIVE
NITRITE: POSITIVE — AB
PH: 6 (ref 5.0–8.0)
PROTEIN: NEGATIVE mg/dL
SPECIFIC GRAVITY, URINE: 1.005 (ref 1.005–1.030)

## 2016-03-13 LAB — CBC
HEMATOCRIT: 40.2 % (ref 35.0–47.0)
Hemoglobin: 13.4 g/dL (ref 12.0–16.0)
MCH: 30.1 pg (ref 26.0–34.0)
MCHC: 33.3 g/dL (ref 32.0–36.0)
MCV: 90.2 fL (ref 80.0–100.0)
Platelets: 294 10*3/uL (ref 150–440)
RBC: 4.46 MIL/uL (ref 3.80–5.20)
RDW: 14.3 % (ref 11.5–14.5)
WBC: 15.6 10*3/uL — AB (ref 3.6–11.0)

## 2016-03-13 LAB — TROPONIN I
Troponin I: <0.03 ng/mL (ref <0.031)
Troponin I: 0.22 ng/mL — ABNORMAL HIGH (ref <0.031)
Troponin I: 0.17 ng/mL — ABNORMAL HIGH (ref <0.031)

## 2016-03-13 LAB — TSH: TSH: 4.134 u[IU]/mL (ref 0.350–4.500)

## 2016-03-13 MED ORDER — FLUOXETINE HCL 40 MG PO CAPS
40.0000 mg | ORAL_CAPSULE | Freq: Every day | ORAL | Status: DC
  Administered 2016-03-14: 40 mg via ORAL
  Filled 2016-03-13: qty 1

## 2016-03-13 MED ORDER — CIPROFLOXACIN HCL 500 MG PO TABS
500.0000 mg | ORAL_TABLET | Freq: Two times a day (BID) | ORAL | Status: DC
  Administered 2016-03-13 – 2016-03-14 (×2): 500 mg via ORAL
  Filled 2016-03-13 (×2): qty 1

## 2016-03-13 MED ORDER — APIXABAN 5 MG PO TABS
5.0000 mg | ORAL_TABLET | Freq: Two times a day (BID) | ORAL | Status: DC
  Administered 2016-03-13 – 2016-03-14 (×2): 5 mg via ORAL
  Filled 2016-03-13 (×2): qty 1

## 2016-03-13 MED ORDER — METOPROLOL TARTRATE 25 MG PO TABS: 12.5000 mg | ORAL_TABLET | Freq: Two times a day (BID) | ORAL | Status: DC

## 2016-03-13 MED ORDER — SODIUM CHLORIDE 0.9% FLUSH
3.0000 mL | Freq: Two times a day (BID) | INTRAVENOUS | Status: DC
  Administered 2016-03-13: 3 mL via INTRAVENOUS

## 2016-03-13 MED ORDER — ALUM & MAG HYDROXIDE-SIMETH 200-200-20 MG/5ML PO SUSP: 30.0000 mL | Freq: Four times a day (QID) | ORAL | Status: DC | PRN

## 2016-03-13 MED ORDER — ACETAMINOPHEN 325 MG PO TABS: 650.0000 mg | ORAL_TABLET | Freq: Four times a day (QID) | ORAL | Status: DC | PRN

## 2016-03-13 MED ORDER — ZOLPIDEM TARTRATE 5 MG PO TABS
5.0000 mg | ORAL_TABLET | Freq: Once | ORAL | Status: AC
  Administered 2016-03-13: 5 mg via ORAL
  Filled 2016-03-13: qty 1

## 2016-03-13 MED ORDER — DILTIAZEM HCL 25 MG/5ML IV SOLN
15.0000 mg | Freq: Once | INTRAVENOUS | Status: AC
  Administered 2016-03-13: 15 mg via INTRAVENOUS

## 2016-03-13 MED ORDER — DILTIAZEM HCL 100 MG IV SOLR
5.0000 mg/h | INTRAVENOUS | Status: DC
  Administered 2016-03-13: 8 mg/h via INTRAVENOUS
  Filled 2016-03-13: qty 100

## 2016-03-13 MED ORDER — SODIUM CHLORIDE 0.9 % IV SOLN
INTRAVENOUS | Status: DC
  Administered 2016-03-13 – 2016-03-14 (×2): via INTRAVENOUS

## 2016-03-13 MED ORDER — CHOLECALCIFEROL 25 MCG (1000 UT) PO TABS
1000.0000 [IU] | ORAL_TABLET | Freq: Every day | ORAL | Status: DC
  Administered 2016-03-14: 1000 [IU] via ORAL
  Filled 2016-03-13 (×2): qty 1

## 2016-03-13 MED ORDER — ONDANSETRON HCL 4 MG PO TABS: 4.0000 mg | ORAL_TABLET | Freq: Four times a day (QID) | ORAL | Status: DC | PRN

## 2016-03-13 MED ORDER — PANTOPRAZOLE SODIUM 40 MG PO TBEC
40.0000 mg | DELAYED_RELEASE_TABLET | Freq: Every day | ORAL | Status: DC
  Administered 2016-03-13 – 2016-03-14 (×2): 40 mg via ORAL
  Filled 2016-03-13 (×2): qty 1

## 2016-03-13 MED ORDER — SODIUM CHLORIDE 0.9 % IV SOLN
1000.0000 mL | Freq: Once | INTRAVENOUS | Status: AC
  Administered 2016-03-13: 1000 mL via INTRAVENOUS

## 2016-03-13 MED ORDER — ONDANSETRON HCL 4 MG/2ML IJ SOLN: 4.0000 mg | Freq: Four times a day (QID) | INTRAMUSCULAR | Status: DC | PRN

## 2016-03-13 MED ORDER — POTASSIUM CHLORIDE CRYS ER 20 MEQ PO TBCR
40.0000 meq | EXTENDED_RELEASE_TABLET | Freq: Once | ORAL | Status: AC
  Administered 2016-03-13: 40 meq via ORAL
  Filled 2016-03-13: qty 2

## 2016-03-13 MED ORDER — SENNOSIDES-DOCUSATE SODIUM 8.6-50 MG PO TABS: 1.0000 | ORAL_TABLET | Freq: Every evening | ORAL | Status: DC | PRN

## 2016-03-13 MED ORDER — ACETAMINOPHEN 650 MG RE SUPP: 650.0000 mg | Freq: Four times a day (QID) | RECTAL | Status: DC | PRN

## 2016-03-13 MED ORDER — POTASSIUM CHLORIDE CRYS ER 20 MEQ PO TBCR: 40.0000 meq | EXTENDED_RELEASE_TABLET | Freq: Once | ORAL | Status: DC

## 2016-03-13 MED ORDER — METOPROLOL TARTRATE 25 MG PO TABS
25.0000 mg | ORAL_TABLET | Freq: Two times a day (BID) | ORAL | Status: DC
Start: 2016-03-13 — End: 2016-03-14
  Administered 2016-03-13 – 2016-03-14 (×2): 25 mg via ORAL
  Filled 2016-03-13 (×2): qty 1

## 2016-03-13 MED ORDER — SIMVASTATIN 40 MG PO TABS
40.0000 mg | ORAL_TABLET | Freq: Every day | ORAL | Status: DC
  Administered 2016-03-14: 40 mg via ORAL
  Filled 2016-03-13: qty 1

## 2016-03-13 MED ORDER — LEVOTHYROXINE SODIUM 100 MCG PO TABS
100.0000 ug | ORAL_TABLET | Freq: Every day | ORAL | Status: DC
  Administered 2016-03-14: 100 ug via ORAL
  Filled 2016-03-13: qty 1

## 2016-03-13 NOTE — ED Notes (Signed)
Pt called ems for nausea vomiting. Ems got there and pt had HR 200. They gave 6 mg adenosine with minimal effect. Pt has hx of afib and was in afib. Gave 10 mg diltiazem per ED doc orders. HR decreased to 130s. Pt feels like nausea is now better.

## 2016-03-13 NOTE — Consult Note (Signed)
Cardiology Consult    Patient ID: Stacey Duncan MRN: [id number], DOB/AGE: 09/25/1931   Admit date: 03/13/2016 Date of Consult: 03/13/2016  Primary Physician: Fidel LevyJames Hawkins Jr, MD Reason for Consult: Atrial Fibrillation with Pauses Primary Cardiologist: Dr. Mariah MillingGollan Requesting Provider: Dr. Juliene PinaMody   History of Present Illness    Stacey Duncan is a 82 y.o. female with past medical history of PAF (diagnosed in 03/2015, on Eliquis), HTN, Hypothyroidism, HLD, GERD, and Fibromyalgia who [facility]2016 for worsening nausea, vomiting and heartburn.   The patient was seen by her PCP yesterday for worsening abdominal pain and dysuria. She was diagnosed with a UTI at that time and started on Ciprofloxacin.  This morning, the patient developed heartburn and started having frequent hiccups and burping (symptoms she typically experiences when in atrial fibrillation). Also had nausea and vomiting, unable to keep down her morning medications.   Upon the arrival of EMS, her HR was 200 and Adenosine was administered with minimal effect. She was given 10mg  IV Cardizem with improvement to a HR of 130. It was noted at that time she was in atrial fibrillation.   In the ED, she was started on IV Cardizem and frequent sinus pauses were noted after the infusion was started. After reviewing the telemetry, these ranged from 1.3 - 2.5 seconds. She was in atrial fibrillation until 1215 but around that time she was disconnected from telemetry. After being placed back on telemetry upon arrival to the floor, she has converted to NSR with HR in the 60's - 70's. No more sinus pauses are noted.  She denies any current symptoms at this time.  Labs this admission show a WBC 15.6. Hgb 13.4. Platelets 294. Na+ 133. K+ 3.3. Creatinine 1.07. Total Bilirubin 1.4. UA positive for trace ketones and positive for nitrites, negative for leukocytes. Troponin negative. TSH pending. CXR with no acute  cardiopulmonary abnormalities.  Past Medical History   Past Medical History  Diagnosis Date  . PAF (paroxysmal atrial fibrillation) (HCC)     a. on Eliquis;  . HTN (hypertension)   . GERD (gastroesophageal reflux disease)   . Hypothyroidism   . Osteoarthritis     a. bilateral knees  . Fibromyalgia   . Hypothyroidism     Past Surgical History  Procedure Laterality Date  . Rotator cuff repair      left   . Abdominal hysterectomy    . Cholecystectomy       Allergies  Allergies  Allergen Reactions  . Sulfa Antibiotics Other (See Comments)    Inpatient Medications    . apixaban  5 mg Oral BID  . Cholecalciferol  1,000 Units Oral Daily  . FLUoxetine  40 mg Oral Daily  . levothyroxine  100 mcg Oral Daily  . metoprolol tartrate  12.5 mg Oral BID  . pantoprazole  40 mg Oral Daily  . potassium chloride  40 mEq Oral Once  . simvastatin  40 mg Oral Daily  . sodium chloride flush  3 mL Intravenous Q12H    Family History    Family History  Problem Relation Age of Onset  . Heart disease Mother 11075    CABG    Social History    Social History   Social History  . Marital Status: Married    Spouse Name: N/A  . Number of Children: N/A  . Years of Education: N/A   Occupational History  . Not on file.   Social History Main Topics  .  Smoking status: Never Smoker   . Smokeless tobacco: Never Used  . Alcohol Use: 3.0 oz/week    5 Glasses of wine per week     Comment: glass of wine daily  . Drug Use: No  . Sexual Activity: Not on file   Other Topics Concern  . Not on file   Social History Narrative     Review of Systems    General:  No chills, fever, night sweats or weight changes.  Cardiovascular:  No chest pain, dyspnea on exertion, edema, orthopnea, palpitations, paroxysmal nocturnal dyspnea. Dermatological: No rash, lesions/masses Respiratory: No cough, dyspnea Urologic: No hematuria, Positive for dysuria (infrequent and painful urination). Abdominal:    No diarrhea, bright red blood per rectum, melena, or hematemesis. Positive for nausea, vomiting, heartburn, and lower abdominal pain. Neurologic:  No visual changes, wkns, changes in mental status. All other systems reviewed and are otherwise negative except as noted above.  Physical Exam    Blood pressure 113/94, pulse 81, temperature 98 F (36.7 C), temperature source Oral, resp. rate 18, height  (1.6 m), weight 145 lb 12.8 oz (66.134 kg), SpO2 97 %.  General: Pleasant, Caucasian female appearing in NAD. Psych: Normal affect. Neuro: Alert and oriented X 3. Moves all extremities spontaneously. HEENT: Normal  Neck: Supple without bruits or JVD. Lungs:  Resp regular and unlabored, CTA without wheezing or rales. Heart: RRR no s3, s4, or murmurs. Abdomen: Soft, non-tender, non-distended, BS + x 4.  Extremities: No clubbing, cyanosis or edema. DP/PT/Radials 2+ and equal bilaterally.  Labs    Troponin (Point of Care Test) No results for input(s): TROPIPOC in the last 72 hours.  Recent Labs  03/13/16 0855  TROPONINI <0.03   Lab Results  Component Value Date   WBC 15.6* 03/13/2016   HGB 13.4 03/13/2016   HCT 40.2 03/13/2016   MCV 90.2 03/13/2016   PLT 294 03/13/2016    Recent Labs Lab 03/13/16 0855  NA 133*  K 3.3*  CL 102  CO2 20*  BUN 11  CREATININE 1.07*  CALCIUM 8.7*  PROT 6.8  BILITOT 1.4*  ALKPHOS 59  ALT 16  AST 25  GLUCOSE 146*   Lab Results  Component Value Date   CHOL 189 10/02/2015   HDL 51 10/02/2015   LDLCALC 116* 10/02/2015   TRIG 108 10/02/2015   No results found for: [facility]   Radiology Studies    Dg Chest Portable 1 View: 03/13/2016  CLINICAL DATA:  Dizziness, acute chest pain. EXAM: PORTABLE CHEST 1 VIEW COMPARISON:  March 28, 2015. FINDINGS: The heart size and mediastinal contours are within normal limits. Both lungs are clear. No pneumothorax or pleural effusion is noted. The visualized skeletal structures are unremarkable. IMPRESSION:  No acute cardiopulmonary abnormality seen. Electronically Signed   By: Lupita Raider, M.D.   On: 03/13/2016 09:18    EKG & Cardiac Imaging    EKG: Atrial fibrillation with RVR. HR 131.  Echocardiogram: None on file.  Assessment & Plan     1. Paroxysmal Atrial Fibrillation - initially diagnosed in 03/2015. Reports 3 episodes of PAF since which present with heartburn, hiccups, and burping. - was just diagnosed with a UTI yesterday and UA is positive for nitrites with WBC of 15.6 which is the likely trigger for her episode.  - Initially noted to be in atrial fibrillation w/ RVR, HR of 200. Started on IV Cardizem with improvement in her HR. In the ED, frequent sinus pauses noted on the  infusion was started. After reviewing the telemetry, these ranged from 1.3 - 2.5 seconds. She was in atrial fibrillation until 1215 but around that time she was disconnected from telemetry. Upon being placed back on telemetry she has converted to NSR with HR in the 60's - 70's since 3:00 PM. No more sinus pauses are noted, as they were likely termination pauses as she was trying to convert to NSR. - will continue IV Cardizem for a bit longer, as she likely just converted in the past hour. Can likely discontinue IV Cardizem later today and convert to her PTA BB as long as she does not have recurrent episodes of atrial fibrillation. - This patients CHA2DS2-VASc Score and unadjusted Ischemic Stroke Rate (% per year) is equal to 4.8 % stroke rate/year from a score of 4 (HTN, Female, Age (2)). Continue Eliquis for anticoagulation.   2. UTI - had two days of dysuria (infrequent and painful urination) - seen by PCP yesterday and started on Cipro  BID. - UA this admission is negative for leukocytes but positive for nitrites. Due to high specificity of nitrites in relation to diagnosis of UTI and with continued symptoms and elevated WBC, would continue Abx treatment.   3. Hypothyroidism - TSH pending.  - continue  Synthroid.  4. HLD - continue statin therapy  5. HTN - BP has been 93/50 - 146/117 while admitted. - would restart PTA BB once off Cardizem drip. Will not do simultaneously due to soft BP while on IV Cardizem.   Signed, Ellsworth Lennox, PA-C 03/13/2016, 3:12 PM Pager: [phone]

## 2016-03-13 NOTE — Progress Notes (Signed)
Patient admitted to unit. Oriented to room, call bell, and staff. Bed in lowest position. Fall safety plan reviewed. Full assessment to Epic. Skin assessment verified with ---Greer EeSusan Presto RN--. Telemetry box verification with tele clerk and NT- Box#: --[id number]---. Will continue to monitor.

## 2016-03-13 NOTE — Progress Notes (Signed)
Down for echo

## 2016-03-13 NOTE — Progress Notes (Signed)
*  PRELIMINARY RESULTS* Echocardiogram 2D Echocardiogram has been performed.  Stacey Duncan 03/13/2016, 8:31 PM

## 2016-03-13 NOTE — Progress Notes (Signed)
Dr. Kirke CorinArida notified of increase in troponin. No interventions at this time. MD to round this evening.

## 2016-03-13 NOTE — ED Provider Notes (Signed)
[facility] Emergency Department Provider Note  ____________________________________________    I have reviewed the triage vital signs and the nursing notes.   HISTORY  Chief Complaint Emesis and Atrial Fibrillation    HPI Stacey Duncan is a 82 y.o. female who presents with complaints of nausea vomiting and chest discomfort. Patient reports she has a history of atrial fibrillation often she feels this way when she has an exacerbation. She denies fevers or chills. She reports yesterday she felt "lousy "and was treated for urinary tract infection by her PCP. EMS reportedly gave her adenosine because her heart rate was very high which showed atrial fibrillation and even gave her 10 of Cardizem which seemed to improve her symptoms. She denies recent travel. No calf pain or swelling.     Past Medical History  Diagnosis Date  . PAF (paroxysmal atrial fibrillation) (HCC)     a. on Eliquis;  . HTN (hypertension)   . GERD (gastroesophageal reflux disease)   . Hypothyroidism   . Osteoarthritis     a. bilateral knees  . Fibromyalgia   . Hypothyroidism     Patient Active Problem List   Diagnosis Date Noted  . Hyperlipidemia 01/31/2016  . Fibromyalgia 12/16/2015  . Need for influenza vaccination 09/30/2015  . Atrial fibrillation, unspecified 04/19/2015  . Chronic fatigue 04/19/2015  . Thyroid activity decreased 04/19/2015  . Depression 04/19/2015    Past Surgical History  Procedure Laterality Date  . Rotator cuff repair      left   . Abdominal hysterectomy    . Cholecystectomy      Current Outpatient Rx  Name  Route  Sig  Dispense  Refill  . apixaban (ELIQUIS) 5 MG TABS tablet   Oral   Take 1 tablet (5 mg total) by mouth 2 (two) times daily.   180 tablet   3   . Cholecalciferol (VITAMIN D-1000 MAX ST) 1000 UNITS tablet   Oral   Take 1,000 Units by mouth daily.          . ciprofloxacin (CIPRO) 500 MG tablet   Oral   Take 1 tablet (500  mg total) by mouth 2 (two) times daily.   14 tablet   0   . FLUoxetine (PROZAC) 40 MG capsule      TAKE 1 CAPSULE EVERY DAY   90 capsule   3   . levothyroxine (SYNTHROID, LEVOTHROID) 100 MCG tablet   Oral   Take 1 tablet by mouth daily.         . metoprolol tartrate (LOPRESSOR) 25 MG tablet   Oral   Take 12.5 mg by mouth 2 (two) times daily.         Marland Kitchen omeprazole (PRILOSEC) 20 MG capsule   Oral   Take 20 mg by mouth daily.         . phenazopyridine (PYRIDIUM) 100 MG tablet   Oral   Take 1 tablet (100 mg total) by mouth 3 (three) times daily as needed for pain.   15 tablet   0   . simvastatin (ZOCOR) 40 MG tablet      TAKE 1 TABLET EVERY DAY   90 tablet   3     Allergies Sulfa antibiotics  Family History  Problem Relation Age of Onset  . Heart disease Mother 60    CABG    Social History Social History  Substance Use Topics  . Smoking status: Never Smoker   . Smokeless tobacco: Never Used  .  Alcohol Use: 3.0 oz/week    5 Glasses of wine per week     Comment: glass of wine daily    Review of Systems  Constitutional: Negative for fever. Eyes: Negative for redness ENT: Negative for sore throat Cardiovascular: Heart burn Respiratory: Negative for shortness of breath. Gastrointestinal: Negative for abdominal pain and positive for nausea Genitourinary: Difficulty urinating Musculoskeletal: Negative for back pain. Skin: Negative for rash. Neurological: Negative for focal weakness Psychiatric: no anxiety    ____________________________________________   PHYSICAL EXAM:  VITAL SIGNS: ED Triage Vitals  Enc Vitals Group     BP 03/13/16 0836 146/117 mmHg     Pulse Rate 03/13/16 0836 131     Resp 03/13/16 0836 16     Temp --      Temp src --      SpO2 03/13/16 0836 98 %     Weight 03/13/16 0836 143 lb (64.864 kg)     Height --      Head Cir --      Peak Flow --      Pain Score --      Pain Loc --      Pain Edu? --      Excl. in GC? --      Constitutional: Alert and oriented. Well appearing and in no distress.  Eyes: Conjunctivae are normal. No erythema or injection ENT   Head: Normocephalic and atraumatic.   Mouth/Throat: Mucous membranes are moist. Cardiovascular: Cardiac, irregularly irregular rhythm. Normal and symmetric distal pulses are present in the upper extremities.  Respiratory: Normal respiratory effort without tachypnea nor retractions. Breath sounds are clear and equal bilaterally.  Gastrointestinal: Soft and non-tender in all quadrants. No distention. There is no CVA tenderness. Genitourinary: deferred Musculoskeletal: Nontender with normal range of motion in all extremities. No lower extremity tenderness nor edema. Neurologic:  Normal speech and language. No gross focal neurologic deficits are appreciated. Skin:  Skin is warm, dry and intact. No rash noted. Psychiatric: Mood and affect are normal. Patient exhibits appropriate insight and judgment.  ____________________________________________    LABS (pertinent positives/negatives)  Labs Reviewed - No data to display  ____________________________________________   EKG  ED ECG REPORT I, Jene EveryKINNER, Vanellope Passmore, the attending physician, personally viewed and interpreted this ECG.   Date: 03/13/2016  EKG Time: 08 36  Rate: 131  Rhythm: atrial fibrillation, rate 131  Axis: Left axis deviation  Intervals:A. fib  ST&T Change: Nonspecific   ____________________________________________    RADIOLOGY  Chest x-ray unremarkable  ____________________________________________   PROCEDURES  Procedure(s) performed: none  Critical Care performed: yes  CRITICAL CARE Performed by: Jene EveryKINNER, Carter Kassel   Total critical care time: 30 minutes  Critical care time was exclusive of separately billable procedures and treating other patients.  Critical care was necessary to treat or prevent imminent or life-threatening deterioration.  Critical care was  time spent personally by me on the following activities: development of treatment plan with patient and/or surrogate as well as nursing, discussions with consultants, evaluation of patient's response to treatment, examination of patient, obtaining history from patient or surrogate, ordering and performing treatments and interventions, ordering and review of laboratory studies, ordering and review of radiographic studies, pulse oximetry and re-evaluation of patient's condition.   ____________________________________________   INITIAL IMPRESSION / ASSESSMENT AND PLAN / ED COURSE  Pertinent labs & imaging results that were available during my care of the patient were reviewed by me and considered in my medical decision making (see chart for details).  Patient presents with atrial fibrillation with RVR. She did receive Cardizem by EMS that she may require second dose in the emergency department as her heart rate is bouncing up into the 150s at times. We will start IV fluids.  Cardizem 15 mg IV with significant improvement in rate., Blood pressure stable.   Patient continues to feel dizzy and lightheaded. Her labs are reassuring. I will admit her to the hospital for further management ____________________________________________   FINAL CLINICAL IMPRESSION(S) / ED DIAGNOSES  Final diagnoses:  Atrial fibrillation with RVR (HCC)          Jene Every, MD 03/13/16 1326

## 2016-03-13 NOTE — H&P (Signed)
[facility] Physicians - [place] at [facility]   PATIENT NAME: Stacey Duncan    MR#:  [id number]  DATE OF BIRTH:  1931-11-22  DATE OF ADMISSION:  03/13/2016  PRIMARY CARE PHYSICIAN: Fidel Levy, MD   REQUESTING/REFERRING PHYSICIAN: dr Wilburn Cornelia  CHIEF COMPLAINT:   Dizziness HISTORY OF PRESENT ILLNESS:  Stacey Duncan  is a 82 y.o. female with a known history of Chronic atrial fibrillation on anticoagulation who presents with above complaint. Patient reports since morning she had dizziness and lightheadedness. She also had episode of nausea and vomiting. She did not feel chest pain or palpitations. She called EMS due to her symptoms and her heart race 200. She was given 6 mg of adenosine with minimal effect. She was then given 10 mg of diltiazem in the emergency room. Her heart rate is better controlled. While the patient has been in the emergency room patient has had positives on telemetry monitoring. During these positives it appears the patient has hiccups and feels nauseous.  PAST MEDICAL HISTORY:   Past Medical History  Diagnosis Date  . PAF (paroxysmal atrial fibrillation) (HCC)     a. on Eliquis;  . HTN (hypertension)   . GERD (gastroesophageal reflux disease)   . Hypothyroidism   . Osteoarthritis     a. bilateral knees  . Fibromyalgia   . Hypothyroidism     PAST SURGICAL HISTORY:   Past Surgical History  Procedure Laterality Date  . Rotator cuff repair      left   . Abdominal hysterectomy    . Cholecystectomy      SOCIAL HISTORY:   Social History  Substance Use Topics  . Smoking status: Never Smoker   . Smokeless tobacco: Never Used  . Alcohol Use: 3.0 oz/week    5 Glasses of wine per week     Comment: glass of wine daily    FAMILY HISTORY:   Family History  Problem Relation Age of Onset  . Heart disease Mother 78    CABG    DRUG ALLERGIES:   Allergies  Allergen Reactions  . Sulfa Antibiotics Other (See Comments)      REVIEW OF SYSTEMS:  CONSTITUTIONAL: No fever, fatigue or weakness.  EYES: No blurred or double vision.  EARS, NOSE, AND THROAT: No tinnitus or ear pain.  RESPIRATORY: No cough, shortness of breath, wheezing or hemoptysis.  CARDIOVASCULAR: No chest pain, orthopnea, edema. Positive for dizziness and lightheaded no palpitations GASTROINTESTINAL: Positive nausea, vomiting, no diarrhea or abdominal pain.  GENITOURINARY: No dysuria, hematuria. Positive for bladder discomfort ENDOCRINE: No polyuria, nocturia,  HEMATOLOGY: No anemia, easy bruising or bleeding SKIN: No rash or lesion. MUSCULOSKELETAL: No joint pain or arthritis.   NEUROLOGIC: No tingling, numbness, weakness.  PSYCHIATRY: No anxiety or depression.   MEDICATIONS AT HOME:   Prior to Admission medications   Medication Sig Start Date End Date Taking? Authorizing Provider  apixaban (ELIQUIS) 5 MG TABS tablet Take 1 tablet (5 mg total) by mouth 2 (two) times daily. 04/29/15  Yes Antonieta Iba, MD  Cholecalciferol (VITAMIN D-1000 MAX ST) 1000 UNITS tablet Take 1,000 Units by mouth daily.    Yes Historical Provider, MD  ciprofloxacin (CIPRO) 500 MG tablet Take 1 tablet (500 mg total) by mouth 2 (two) times daily. 03/12/16 03/19/16 Yes Janeann Forehand., MD  FLUoxetine (PROZAC) 40 MG capsule TAKE 1 CAPSULE EVERY DAY 03/11/16  Yes Janeann Forehand., MD  levothyroxine (SYNTHROID, LEVOTHROID) 100 MCG tablet Take 1 tablet  by mouth daily. 07/24/15  Yes Historical Provider, MD  metoprolol tartrate (LOPRESSOR) 25 MG tablet Take 12.5 mg by mouth 2 (two) times daily.   Yes Historical Provider, MD  omeprazole (PRILOSEC) 20 MG capsule Take 20 mg by mouth daily.   Yes Historical Provider, MD  phenazopyridine (PYRIDIUM) 100 MG tablet Take 1 tablet (100 mg total) by mouth 3 (three) times daily as needed for pain. 03/12/16  Yes Janeann Forehand., MD  simvastatin (ZOCOR) 40 MG tablet TAKE 1 TABLET EVERY DAY 11/04/15  Yes Janeann Forehand., MD       VITAL SIGNS:  Blood pressure 116/63, pulse 80, resp. rate 16, weight 64.864 kg (143 lb), SpO2 99 %.  PHYSICAL EXAMINATION:  GENERAL:  82 y.o.-year-old patient lying in the bed with no acute distress.  EYES: Pupils equal, round, reactive to light and accommodation. No scleral icterus. Extraocular muscles intact.  HEENT: Head atraumatic, normocephalic. Oropharynx and nasopharynx clear.  NECK:  Supple, no jugular venous distention. No thyroid enlargement, no tenderness.  LUNGS: Normal breath sounds bilaterally, no wheezing, rales,rhonchi or crepitation. No use of accessory muscles of respiration.  CARDIOVASCULAR: Tachycardia, irregular irregular with 2 out of systolic ejection murmur no, rubs, or gallops.  ABDOMEN: Soft, nontender, nondistended. Bowel sounds present. No organomegaly or mass.  EXTREMITIES: No pedal edema, cyanosis, or clubbing.  NEUROLOGIC: Cranial nerves II through XII are grossly intact. No focal deficits. PSYCHIATRIC: The patient is alert and oriented x 3.  SKIN: No obvious rash, lesion, or ulcer.   LABORATORY PANEL:   CBC  Recent Labs Lab 03/13/16 0855  WBC 15.6*  HGB 13.4  HCT 40.2  PLT 294   ------------------------------------------------------------------------------------------------------------------  Chemistries   Recent Labs Lab 03/13/16 0855  NA 133*  K 3.3*  CL 102  CO2 20*  GLUCOSE 146*  BUN 11  CREATININE 1.07*  CALCIUM 8.7*  AST 25  ALT 16  ALKPHOS 59  BILITOT 1.4*   ------------------------------------------------------------------------------------------------------------------  Cardiac Enzymes  Recent Labs Lab 03/13/16 0855  TROPONINI <0.03   ------------------------------------------------------------------------------------------------------------------  RADIOLOGY:  Dg Chest Portable 1 View  03/13/2016  CLINICAL DATA:  Dizziness, acute chest pain. EXAM: PORTABLE CHEST 1 VIEW COMPARISON:  March 28, 2015. FINDINGS: The  heart size and mediastinal contours are within normal limits. Both lungs are clear. No pneumothorax or pleural effusion is noted. The visualized skeletal structures are unremarkable. IMPRESSION: No acute cardiopulmonary abnormality seen. Electronically Signed   By: Lupita Raider, M.D.   On: 03/13/2016 09:18    EKG:   Atrial fibrillation heart rate 131 with PVCs  IMPRESSION AND PLAN:    82 year old female with chronic atrial fibrillation on anticoagulation who presented with dizziness and lightheadedness subsequently found to have A. fib RVR and occasional pauses on telemetry monitoring emergency room.  1. Atrial fibrillation with RVR and less than 2 second positive. I think that it causes are due to the atrial fibrillation try to convert into normal sinus rhythm. She needs ongoing telemetry monitoring. Cycle cardiac enzymes. Adolph Pollack cardiology consultation. Diltiazem infusion for better heart rate control and continue metoprolol and Eliquis  2. Hyponatremia: This could be due to some hypovolemia as evidenced by UA showing ketones.*IV fluids and repeat BMP in a.m.  3. Hypokalemia: Replete potassium and recheck in a.m.  4. Leukocytosis: No evidence of pneumonia on UA or chest x-ray. Likely due to dehydration.  5. Hypothyroidism: Continue Synthroid.   All the records are reviewed and case discussed with ED provider. Management  plans discussed with the patient and she is in agreement.  CODE STATUS: LIMITED no MV  CRITICAL CARE TOTAL TIME TAKING CARE OF THIS PATIENT: 50 minutes.    Myracle Febres M.D on 03/13/2016 at 11:57 AM  Between 7am to 6pm - Pager - [phone] After 6pm go to [email or web address] - password [email or web address] [facility]  [place] [place] [facility]  Office  [phone]  CC: Primary care physician; Fidel LevyJames Hawkins Jr, MD

## 2016-03-14 LAB — BASIC METABOLIC PANEL
Anion gap: 4 — ABNORMAL LOW (ref 5–15)
BUN: 12 mg/dL (ref 6–20)
CO2: 22 mmol/L (ref 22–32)
CREATININE: 0.7 mg/dL (ref 0.44–1.00)
Calcium: 8.2 mg/dL — ABNORMAL LOW (ref 8.9–10.3)
Chloride: 108 mmol/L (ref 101–111)
GFR calc Af Amer: >60 mL/min (ref >60)
GLUCOSE: 110 mg/dL — AB (ref 65–99)
POTASSIUM: 3.8 mmol/L (ref 3.5–5.1)
SODIUM: 134 mmol/L — AB (ref 135–145)

## 2016-03-14 LAB — CBC
HCT: 34.5 % — ABNORMAL LOW (ref 35.0–47.0)
Hemoglobin: 11.6 g/dL — ABNORMAL LOW (ref 12.0–16.0)
MCH: 30.4 pg (ref 26.0–34.0)
MCHC: 33.6 g/dL (ref 32.0–36.0)
MCV: 90.5 fL (ref 80.0–100.0)
PLATELETS: 262 10*3/uL (ref 150–440)
RBC: 3.81 MIL/uL (ref 3.80–5.20)
RDW: 14.4 % (ref 11.5–14.5)
WBC: 10.8 10*3/uL (ref 3.6–11.0)

## 2016-03-14 LAB — ECHOCARDIOGRAM COMPLETE
Height: 63 in
Weight: 2332.8 oz

## 2016-03-14 LAB — TROPONIN I: Troponin I: 0.14 ng/mL — ABNORMAL HIGH (ref <0.031)

## 2016-03-14 MED ORDER — APIXABAN 5 MG PO TABS: 5.0000 mg | ORAL_TABLET | Freq: Two times a day (BID) | ORAL | Status: DC

## 2016-03-14 MED ORDER — METOPROLOL TARTRATE 25 MG PO TABS: 25.0000 mg | ORAL_TABLET | Freq: Two times a day (BID) | ORAL | Status: DC

## 2016-03-14 MED ORDER — ACETAMINOPHEN 325 MG PO TABS: 650.0000 mg | ORAL_TABLET | Freq: Four times a day (QID) | ORAL | Status: DC | PRN

## 2016-03-14 NOTE — Discharge Summary (Signed)
[facility] Physicians - [place] at [facility]   PATIENT NAME: Stacey Duncan    MR#:  [id number]  DATE OF BIRTH:  1931-07-05  DATE OF ADMISSION:  03/13/2016 ADMITTING PHYSICIAN: Adrian Saran, MD  DATE OF DISCHARGE: 03/14/2016 PRIMARY CARE PHYSICIAN: Fidel Levy, MD    ADMISSION DIAGNOSIS:  Atrial fibrillation with RVR (HCC) [I48.91]  DISCHARGE DIAGNOSIS:  Active Problems:   Atrial fibrillation (HCC)   Atrial fibrillation with RVR (HCC) GERD  SECONDARY DIAGNOSIS:   Past Medical History  Diagnosis Date  . PAF (paroxysmal atrial fibrillation) (HCC)     a. on Eliquis;  . HTN (hypertension)   . GERD (gastroesophageal reflux disease)   . Hypothyroidism   . Osteoarthritis     a. bilateral knees  . Fibromyalgia     HOSPITAL COURSE:   82 year old female with chronic atrial fibrillation on anticoagulation who presented with dizziness and lightheadedness subsequently found to have A. fib RVR and occasional pauses on telemetry monitoring emergency room.  1. Atrial fibrillation with RVR  Converted back to normal sinus rhythm and discontinued Cardizem drip Resume eliquis and prescription is given as patient ran out of it Elevated cardiac enzymes are probably from demand ischemia , point to 0.22.-0.17-0.14 cardiology has recommended outpatient stress test and a follow-up with [facility] Echocardiogram is normal with normal ejection fraction Chest x-ray normal Increased patient's metoprolol to 25 mg by mouth twice a day  2. Hyponatremia: This could be due to some hypovolemia as evidenced by UA showing ketones Improved with IV fluids sodium is at 134 today  3. Hypokalemia: Replete potassium in the normal range at 3.8  4. Leukocytosis: No evidence of pneumonia on UA or chest x-ray. Improved  5. Hypothyroidism: Continue Synthroid.  6. GERD-continue omeprazole   DISCHARGE CONDITIONS:   Fair  CONSULTS OBTAINED:  Treatment Team:  Iran Ouch,  MD   PROCEDURES None  DRUG ALLERGIES:   Allergies  Allergen Reactions  . Sulfa Antibiotics Other (See Comments)    DISCHARGE MEDICATIONS:   Current Discharge Medication List    START taking these medications   Details  acetaminophen (TYLENOL) 325 MG tablet Take 2 tablets (650 mg total) by mouth every 6 (six) hours as needed for mild pain (or Fever >/= 101).      CONTINUE these medications which have CHANGED   Details  apixaban (ELIQUIS) 5 MG TABS tablet Take 1 tablet (5 mg total) by mouth 2 (two) times daily. Qty: 60 tablet, Refills: 0    metoprolol tartrate (LOPRESSOR) 25 MG tablet Take 1 tablet (25 mg total) by mouth 2 (two) times daily. Qty: 60 tablet, Refills: 0      CONTINUE these medications which have NOT CHANGED   Details  Cholecalciferol (VITAMIN D-1000 MAX ST) 1000 UNITS tablet Take 1,000 Units by mouth daily.     ciprofloxacin (CIPRO) 500 MG tablet Take 1 tablet (500 mg total) by mouth 2 (two) times daily. Qty: 14 tablet, Refills: 0   Associated Diagnoses: UTI symptoms    FLUoxetine (PROZAC) 40 MG capsule TAKE 1 CAPSULE EVERY DAY Qty: 90 capsule, Refills: 3    levothyroxine (SYNTHROID, LEVOTHROID) 100 MCG tablet Take 1 tablet by mouth daily.    omeprazole (PRILOSEC) 20 MG capsule Take 20 mg by mouth daily.    phenazopyridine (PYRIDIUM) 100 MG tablet Take 1 tablet (100 mg total) by mouth 3 (three) times daily as needed for pain. Qty: 15 tablet, Refills: 0   Associated Diagnoses: UTI symptoms  simvastatin (ZOCOR) 40 MG tablet TAKE 1 TABLET EVERY DAY Qty: 90 tablet, Refills: 3         DISCHARGE INSTRUCTIONS:   Activity as tolerated  diet cardiac Follow-up with primary care physician in a week Follow-up with cardiology Dr. Mariah Milling in 3-4 days for outpatient stress test   DIET:  Cardiac diet  DISCHARGE CONDITION:  Fair  ACTIVITY:  Activity as tolerated  OXYGEN:  Home Oxygen: No.   Oxygen Delivery: room air  DISCHARGE LOCATION:   home   If you experience worsening of your admission symptoms, develop shortness of breath, life threatening emergency, suicidal or homicidal thoughts you must seek medical attention immediately by calling 911 or calling your MD immediately  if symptoms less severe.  You Must read complete instructions/literature along with all the possible adverse reactions/side effects for all the Medicines you take and that have been prescribed to you. Take any new Medicines after you have completely understood and accpet all the possible adverse reactions/side effects.   Please note  You were cared for by a hospitalist during your hospital stay. If you have any questions about your discharge medications or the care you received while you were in the hospital after you are discharged, you can call the unit and asked to speak with the hospitalist on call if the hospitalist that took care of you is not available. Once you are discharged, your primary care physician will handle any further medical issues. Please note that NO REFILLS for any discharge medications will be authorized once you are discharged, as it is imperative that you return to your primary care physician (or establish a relationship with a primary care physician if you do not have one) for your aftercare needs so that they can reassess your need for medications and monitor your lab values.     Today  Chief Complaint  Patient presents with  . Emesis  . Atrial Fibrillation   Patient is resting comfortably. Denies any palpitations or shortness of breath. Denies any chest pain. Husband and daughter at bedside. Patient ran out of Eliquis prescription  ROS:  CONSTITUTIONAL: Denies fevers, chills. Denies any fatigue, weakness.  EYES: Denies blurry vision, double vision, eye pain. EARS, NOSE, THROAT: Denies tinnitus, ear pain, hearing loss. RESPIRATORY: Denies cough, wheeze, shortness of breath.  CARDIOVASCULAR: Denies chest pain, palpitations,  edema.  GASTROINTESTINAL: Denies nausea, vomiting, diarrhea, abdominal pain. Denies bright red blood per rectum. GENITOURINARY: Denies dysuria, hematuria. ENDOCRINE: Denies nocturia or thyroid problems. HEMATOLOGIC AND LYMPHATIC: Denies easy bruising or bleeding. SKIN: Denies rash or lesion. MUSCULOSKELETAL: Denies pain in neck, back, shoulder, knees, hips or arthritic symptoms.  NEUROLOGIC: Denies paralysis, paresthesias.  PSYCHIATRIC: Denies anxiety or depressive symptoms.   VITAL SIGNS:  Blood pressure 105/64, pulse 64, temperature 98.4 F (36.9 C), temperature source Oral, resp. rate 16, height  (1.6 m), weight 66.134 kg (145 lb 12.8 oz), SpO2 96 %.  I/O:    Intake/Output Summary (Last 24 hours) at 03/14/16 1129 Last data filed at 03/14/16 0938  Gross per 24 hour  Intake 983.75 ml  Output    500 ml  Net 483.75 ml    PHYSICAL EXAMINATION:  GENERAL:  82 y.o.-year-old patient lying in the bed with no acute distress.  EYES: Pupils equal, round, reactive to light and accommodation. No scleral icterus. Extraocular muscles intact.  HEENT: Head atraumatic, normocephalic. Oropharynx and nasopharynx clear.  NECK:  Supple, no jugular venous distention. No thyroid enlargement, no tenderness.  LUNGS: Normal breath  sounds bilaterally, no wheezing, rales,rhonchi or crepitation. No use of accessory muscles of respiration.  CARDIOVASCULAR: S1, S2 normal. No murmurs, rubs, or gallops.  ABDOMEN: Soft, non-tender, non-distended. Bowel sounds present. No organomegaly or mass.  EXTREMITIES: No pedal edema, cyanosis, or clubbing.  NEUROLOGIC: Cranial nerves II through XII are intact. Muscle strength 5/5 in all extremities. Sensation intact. Gait not checked.  PSYCHIATRIC: The patient is alert and oriented x 3.  SKIN: No obvious rash, lesion, or ulcer.   DATA REVIEW:   CBC  Recent Labs Lab 03/14/16 0234  WBC 10.8  HGB 11.6*  HCT 34.5*  PLT 262    Chemistries   Recent Labs Lab  03/13/16 0855 03/14/16 0234  NA 133* 134*  K 3.3* 3.8  CL 102 108  CO2 20* 22  GLUCOSE 146* 110*  BUN 11 12  CREATININE 1.07* 0.70  CALCIUM 8.7* 8.2*  AST 25  --   ALT 16  --   ALKPHOS 59  --   BILITOT 1.4*  --     Cardiac Enzymes  Recent Labs Lab 03/14/16 0234  TROPONINI 0.14*    Microbiology Results  No results found for this or any previous visit.  RADIOLOGY:  Dg Chest Portable 1 View  03/13/2016  CLINICAL DATA:  Dizziness, acute chest pain. EXAM: PORTABLE CHEST 1 VIEW COMPARISON:  March 28, 2015. FINDINGS: The heart size and mediastinal contours are within normal limits. Both lungs are clear. No pneumothorax or pleural effusion is noted. The visualized skeletal structures are unremarkable. IMPRESSION: No acute cardiopulmonary abnormality seen. Electronically Signed   By: Lupita RaiderJames  Green Jr, M.D.   On: 03/13/2016 09:18    EKG:   Orders placed or performed in visit on 01/31/16  . EKG 12-Lead      Management plans discussed with the patient, family and they are in agreement.  CODE STATUS:     Code Status Orders        Start     Ordered   03/13/16 1501  Limited resuscitation (code)   Continuous    Question Answer Comment  In the event of cardiac or respiratory ARREST: Initiate Code Blue, Call Rapid Response Yes   In the event of cardiac or respiratory ARREST: Perform CPR Yes   In the event of cardiac or respiratory ARREST: Perform Intubation/Mechanical Ventilation No   In the event of cardiac or respiratory ARREST: Use NIPPV/BiPAp only if indicated Yes   In the event of cardiac or respiratory ARREST: Administer ACLS medications if indicated Yes   In the event of cardiac or respiratory ARREST: Perform Defibrillation or Cardioversion if indicated Yes      03/13/16 1500    Code Status History    Date Active Date Inactive Code Status Order ID Comments User Context   This patient has a current code status but no historical code status.      TOTAL TIME  TAKING CARE OF THIS PATIENT: 45 minutes.    @[facility] @  on 03/14/2016 at 11:29 AM  Between 7am to 6pm - Pager - [phone]  After 6pm go to [email or web address] - password [email or web address] [facility]  [place] [place] [facility]  Office  [phone]  CC: Primary care physician; Fidel LevyJames Hawkins Jr, MD

## 2016-03-14 NOTE — Progress Notes (Signed)
A&O. Up with one assist. IV fluids infusing. Echo pending.

## 2016-03-14 NOTE — Discharge Instructions (Signed)
Activity as tolerated  diet cardiac Follow-up with primary care physician in a week Follow-up with cardiology Dr. Mariah MillingGollan in 3-4 days for outpatient stress test

## 2016-03-14 NOTE — Progress Notes (Signed)
Pt. Discharged to home via wc. Discharge instructions and medication regimen reviewed at bedside with patient, husband and daughter. They verbalize understanding of instructions and medication regimen. Prescriptions included with discharge papers. Patient assessment unchanged from this morning. TELE and IV discontinued per policy.

## 2016-03-14 NOTE — Progress Notes (Signed)
Remains in sinus rhythm this am  Could discharge from cardiology standpoint with outpatient follow-up  Cardiology to see as needed over the weekend Please call with questions  Hillis RangeJames Blandina Renaldo MD, [facility]ACC 03/14/2016 9:19 AM

## 2016-03-16 ENCOUNTER — Encounter: Payer: Self-pay | Admitting: Cardiovascular Disease

## 2016-03-16 ENCOUNTER — Ambulatory Visit (INDEPENDENT_AMBULATORY_CARE_PROVIDER_SITE_OTHER): Payer: Medicare PPO | Admitting: Cardiovascular Disease

## 2016-03-16 VITALS — BP 120/60 | HR 57 | Ht 63.0 in | Wt 143.0 lb

## 2016-03-16 DIAGNOSIS — I480 Paroxysmal atrial fibrillation: Secondary | ICD-10-CM | POA: Diagnosis not present

## 2016-03-16 DIAGNOSIS — R7989 Other specified abnormal findings of blood chemistry: Secondary | ICD-10-CM

## 2016-03-16 DIAGNOSIS — I4891 Unspecified atrial fibrillation: Secondary | ICD-10-CM

## 2016-03-16 DIAGNOSIS — R778 Other specified abnormalities of plasma proteins: Secondary | ICD-10-CM | POA: Insufficient documentation
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
## 2016-03-16 NOTE — Assessment & Plan Note (Signed)
Elevated troponin noted in the hospital in the setting of atrial fibrillation with RVR, UTI, nausea, likely demand ischemia. Long discussion with her and her family concerning whether stress test is needed. I feel comfortable as does the patient with monitoring for now given the most likely scenario that the elevated troponin was secondary to demand ischemia. She does not have significant risk factors for coronary artery disease.

## 2016-03-16 NOTE — Patient Instructions (Signed)
You are doing well. No medication changes were made.  Please call us if you have new issues that need to be addressed before your next appt.  Your physician wants you to follow-up in: 6 months.  You will receive a reminder letter in the mail two months in advance. If you don't receive a letter, please call our office to schedule the follow-up appointment.   

## 2016-03-16 NOTE — Assessment & Plan Note (Signed)
Paroxysmal atrial fibrillation in the setting of urinary tract infection, pain, vomiting Recommended she stay on her current medication regiment If she has frequent episodes of paroxysmal atrial fibrillation not in the setting of infection, may need to rethink her medication regiment and add a antiarrhythmic This was discussed with her in detail No medication changes made at this time

## 2016-03-19 ENCOUNTER — Ambulatory Visit: Payer: Medicare PPO

## 2016-03-19 ENCOUNTER — Telehealth: Payer: Self-pay

## 2016-03-19 ENCOUNTER — Other Ambulatory Visit: Payer: Self-pay | Admitting: Family Medicine

## 2016-03-19 DIAGNOSIS — I4891 Unspecified atrial fibrillation: Secondary | ICD-10-CM

## 2016-03-19 NOTE — Telephone Encounter (Signed)
Patient had EMS at home Friday and was in ER overnight to have 200 heart rate go down. Also saw Cardio Monday. Cardiologist recommended she keep antibiotic on hand at ALL times and to take it as soon as sign of infection, any infection. They feel the infections cause the rapid heart rate and it is very dangerous how high the heart rate gets on her and how long it takes to bring down. What exact date should we do FU urine culture? Does she need to go to Urology?

## 2016-03-19 NOTE — Telephone Encounter (Signed)
Relayed message to patient and she will call Dr. Heywood FootmanStoioff's office to schedule.

## 2016-03-19 NOTE — Telephone Encounter (Signed)
I could give you a prescription for Macrobid to take, but I would really like to collect a specimen to send for culture before starting any antibiotic so as not to create any resistent germs.  You have not had that many UTIs recently have you?Stacey Duncan.  Lets go ahead and send you to Urology for their evaluation opinion on this.  Go ahead and make appt. At [facility] for evaluation of UTIs contributing to SVT vs. A. Fib.a and poss. Need for antibiotics to start before beiong seen with UTI sx.

## 2016-03-19 NOTE — Telephone Encounter (Signed)
Patient wanted to know what you think RE: Cardiologist [facility](Gollan) recommended she keep antibiotic on hand at ALL times and to take it as soon as sign of infection, any infection. They feel the infections cause the rapid heart rate and it is very dangerous how high the heart rate gets on her and how long it takes to bring down.

## 2016-03-19 NOTE — Telephone Encounter (Signed)
We need repeat Urine culture about 2 weeks from your last visit.  Just come by to collect urine for it to be sent for culture.  No need to see Urol at this time, but we can discuss.-jh

## 2016-03-25 ENCOUNTER — Telehealth: Payer: Self-pay | Admitting: Family Medicine

## 2016-03-25 NOTE — Telephone Encounter (Signed)
Pt called wanted to know if her referral was done  ( Urology) pt call back # is  [phone]

## 2016-03-26 NOTE — Telephone Encounter (Signed)
Referral submitted to [facility].[place]

## 2016-03-31 ENCOUNTER — Other Ambulatory Visit: Payer: Self-pay | Admitting: Family Medicine

## 2016-04-01 ENCOUNTER — Other Ambulatory Visit: Payer: Self-pay | Admitting: Family Medicine

## 2016-04-01 DIAGNOSIS — N390 Urinary tract infection, site not specified: Secondary | ICD-10-CM

## 2016-04-03 ENCOUNTER — Other Ambulatory Visit: Payer: Self-pay | Admitting: Family Medicine

## 2016-04-03 DIAGNOSIS — N3000 Acute cystitis without hematuria: Secondary | ICD-10-CM

## 2016-04-03 LAB — URINE CULTURE

## 2016-04-03 MED ORDER — CEPHALEXIN 500 MG PO CAPS
500.0000 mg | ORAL_CAPSULE | Freq: Two times a day (BID) | ORAL | Status: DC
Start: 2016-04-03 — End: 2016-07-28

## 2016-04-08 ENCOUNTER — Telehealth: Payer: Self-pay | Admitting: *Deleted

## 2016-04-08 NOTE — Telephone Encounter (Signed)
Patient wants to know if she needs to come back and do another ua. She will finish Keflex today. FYI she had some abd pain on yesterday and took AZO. Urology appt is 05/19/16.

## 2016-04-08 NOTE — Telephone Encounter (Signed)
I would run this by Dr. Juanetta GoslingHawkins as the last UC was just ordered under my name. I am not familiar with the whole story. She had a group beta strep UTI- most are pretty easy to treat.  If she is seeing urology soon, I don't think we need a repeat UC/UA unless she continues to have symptoms. Did the AZO help her symptoms? The abdominal pain could be caused by the abx themselves. If abdominal pain, flank pain, or dysuria continues- she should come in for an appt to be seen. Thanks! AK

## 2016-04-08 NOTE — Telephone Encounter (Signed)
Called Dr. Lonna CobbStoioff office and they will put her on waiting list to be seen early and they will call pt.

## 2016-04-09 NOTE — Telephone Encounter (Signed)
I would like a repeat urine culture in 7-10 days.  If abd. Pain persists, then she needs to be seen.  We have requested earlietr appt. With [employer].   They are supposed tocontact her if there is a cancellation sooner.-jh

## 2016-04-09 NOTE — Telephone Encounter (Signed)
Called patient and informed to repeat UA after next Wednesday.[place]

## 2016-04-15 ENCOUNTER — Other Ambulatory Visit: Payer: Self-pay | Admitting: Family Medicine

## 2016-04-15 ENCOUNTER — Other Ambulatory Visit: Payer: Self-pay | Admitting: *Deleted

## 2016-04-15 DIAGNOSIS — N390 Urinary tract infection, site not specified: Secondary | ICD-10-CM

## 2016-04-16 ENCOUNTER — Telehealth: Payer: Self-pay | Admitting: Family Medicine

## 2016-04-16 NOTE — Telephone Encounter (Signed)
I do not see this test again last culture 04/03/2016 and nothing on [facility]. I got busy signal and no answer when I tries at 5 to call.

## 2016-04-16 NOTE — Telephone Encounter (Signed)
Pt called for result of urine culture.  She said her appt with Dr. Lonna CobbStoioff was moved for May 22nd up to May 9th.  Her call back number is [phone]

## 2016-04-17 LAB — URINE CULTURE

## 2016-04-20 MED ORDER — AMOXICILLIN-POT CLAVULANATE 875-125 MG PO TABS: 1.0000 | ORAL_TABLET | Freq: Two times a day (BID) | ORAL | Status: DC

## 2016-04-20 NOTE — Telephone Encounter (Signed)
Results are in system awaiting physcian.[place]

## 2016-04-20 NOTE — Telephone Encounter (Signed)
Can you see if this ever got taken care of and where the CX was? Last week I tried to get more info but it was at closing Thursday and Dr. Juanetta GoslingHawkins was going to have you call the next day or wait for her to call back.

## 2016-04-28 ENCOUNTER — Other Ambulatory Visit: Payer: Self-pay | Admitting: Cardiovascular Disease

## 2016-05-05 DIAGNOSIS — N390 Urinary tract infection, site not specified: Secondary | ICD-10-CM | POA: Diagnosis not present

## 2016-05-05 DIAGNOSIS — R339 Retention of urine, unspecified: Secondary | ICD-10-CM | POA: Diagnosis not present

## 2016-05-05 DIAGNOSIS — R3129 Other microscopic hematuria: Secondary | ICD-10-CM | POA: Diagnosis not present

## 2016-05-06 ENCOUNTER — Ambulatory Visit: Payer: Medicare PPO | Admitting: Cardiovascular Disease

## 2016-05-19 DIAGNOSIS — N390 Urinary tract infection, site not specified: Secondary | ICD-10-CM | POA: Diagnosis not present

## 2016-05-19 DIAGNOSIS — N200 Calculus of kidney: Secondary | ICD-10-CM | POA: Diagnosis not present

## 2016-05-20 ENCOUNTER — Other Ambulatory Visit: Payer: Self-pay | Admitting: Family Medicine

## 2016-05-20 ENCOUNTER — Telehealth: Payer: Self-pay | Admitting: Cardiovascular Disease

## 2016-05-20 NOTE — Telephone Encounter (Signed)
Spoke w/ pt.  She reports that she was at Dr. Heywood FootmanStoioff's office yesterday for bladder u/s. She has had recurrent UTI w/ 3 rounds of abx.  Her SBP at his office was 178. Pt does not have BP cuff at home, she went to [facility] and was told that her SBP was 162 and her HR was "good". She has been taking metoprolol 25 mg 1/2 tab in am, whole in pm. Advised her that her chart indicates this is to be 25 mg BID, but she reported lethargy, so this was decreased to 1/2 pill in the am. She would like to know if she should increase to a whole pill BID, as she is concerned about popping back into afib. I do not see documentation of this dose being decreased, but she has been taking this way since her last ov 03/16/16. Pt would like to know if Dr. Mariah MillingGollan would like for her to increase metoprolol or put her on antiarrhythmic "just in case". Pt has no other symptoms, reports she feels fine, but these readings are concerning to her.  Discussed w/ her the reasons that her BP would be elevated, esp under stress and pain, but she insists that Dr. Mariah MillingGollan be made aware in case he wants to adjust her meds. Advised her that I will make him aware of her concerns and call her back w/ his recommendation.

## 2016-05-20 NOTE — Telephone Encounter (Signed)
Pt c/o BP issue: STAT if pt c/o blurred vision, one-sided weakness or slurred speech  1. What are your last 5 BP readings? 178 systolic yesterday 162 systolic today   2. Are you having any other symptoms (ex. Dizziness, headache, blurred vision, passed out)? No   3. What is your BP issue? Patient says she takes  metoprolol 25 mg  0.5 mg tablet in the morning and 1 tablet every night  As well as Eliquis 5 mg x 2 po daily   Patient is concerned that she may have problems with her afib she is scare that she may convert back.  Please call to discuss.

## 2016-05-21 NOTE — Telephone Encounter (Signed)
Would increase metoprolol up to 25 mg twice a day for now Need blood pressure cuff at home Need more blood pressure and heart rate numbers outside of Dr. offices when she is relaxed May need different blood pressure medication if numbers remain high

## 2016-05-21 NOTE — Telephone Encounter (Signed)
Spoke with patient and instructed her to increase her metoprolol to 25 mg 1 whole pill twice daily for now. Also instructed her to obtain a blood pressure machine for home use and to start taking her blood pressures twice a day and keeping a log so that we can see a trend of her blood pressures and heart rate. Let her know that sometimes the blood pressure can run higher in certain situations and keeping this log will let us know if we need to dry different medications or add something. Patient verbalized understanding to increase medication and do the blood pressure readings. Instructed her to make sure she brings those to her next appointment so they can be reviewed. She verbalized understanding of all instructions and had no further questions at this time.

## 2016-06-23 ENCOUNTER — Ambulatory Visit: Payer: Medicare PPO | Admitting: Family Medicine

## 2016-06-29 ENCOUNTER — Telehealth: Payer: Self-pay | Admitting: Cardiovascular Disease

## 2016-06-29 NOTE — Telephone Encounter (Signed)
Spoke w/ pt.  She reports that she is doing well on the metoprolol 25 mg BID.  Advised her to continue to monitor and record her BP readings and to call if she is concerned about any of the readings.  She is appreciative of the call and will keep her appt in Sept.

## 2016-06-29 NOTE — Telephone Encounter (Signed)
Pt c/o BP issue: STAT if pt c/o blurred vision, one-sided weakness or slurred speech  1. What are your last 5 BP readings?  06/29/16: AM 113/58 06/28/16: Did not do  06/27/16 158/81 06/26/16 PM 133/52 06/25/16 AM 126/63 pm 115/63  2. Are you having any other symptoms (ex. Dizziness, headache, blurred vision, passed out)?  Nothing  3. What is your BP issue?  Pt is not sure what to do. Was told to call back with Numbers. Would like to know what do to based on these readings.

## 2016-07-28 ENCOUNTER — Ambulatory Visit (INDEPENDENT_AMBULATORY_CARE_PROVIDER_SITE_OTHER): Payer: Medicare PPO | Admitting: Family Medicine

## 2016-07-28 ENCOUNTER — Encounter: Payer: Self-pay | Admitting: Family Medicine

## 2016-07-28 VITALS — BP 150/65 | HR 56 | Temp 98.2°F | Resp 16 | Ht 63.0 in | Wt 147.0 lb

## 2016-07-28 DIAGNOSIS — R5382 Chronic fatigue, unspecified: Secondary | ICD-10-CM

## 2016-07-28 DIAGNOSIS — I10 Essential (primary) hypertension: Secondary | ICD-10-CM

## 2016-07-28 DIAGNOSIS — E039 Hypothyroidism, unspecified: Secondary | ICD-10-CM

## 2016-07-28 DIAGNOSIS — I4891 Unspecified atrial fibrillation: Secondary | ICD-10-CM

## 2016-07-28 DIAGNOSIS — F329 Major depressive disorder, single episode, unspecified: Secondary | ICD-10-CM | POA: Diagnosis not present

## 2016-07-28 DIAGNOSIS — F32A Depression, unspecified: Secondary | ICD-10-CM

## 2016-07-28 MED ORDER — LOSARTAN POTASSIUM 25 MG PO TABS: 25.0000 mg | ORAL_TABLET | Freq: Every day | ORAL | 3 refills | Status: DC

## 2016-07-28 MED ORDER — METOPROLOL TARTRATE 25 MG PO TABS: ORAL_TABLET | ORAL | 0 refills | Status: DC

## 2016-07-28 NOTE — Progress Notes (Signed)
Name: Stacey Duncan   MRN: [id number]    DOB: 30-Apr-1931   Date:07/28/2016       Progress Note  Subjective  Chief Complaint  Chief Complaint  Patient presents with  . Hypothyroidism  . Hyperlipidemia    HPI Here for f/u of depression, hypothyroidism, HBP, a.fib, elevated lipids.  She is taking meds.She is not feeling as well as she has in the past.  Feels tired and sleepy.  She wonders about her thyroid and BP meds.  BPs at home from 90--170 systolic.  Her pulse rates run from 40s to 70s.  No problem-specific Assessment & Plan notes found for this encounter.   Past Medical History:  Diagnosis Date  . Fibromyalgia   . GERD (gastroesophageal reflux disease)   . HTN (hypertension)   . Hypothyroidism   . Osteoarthritis    a. bilateral knees  . PAF (paroxysmal atrial fibrillation) (HCC)    a. on Eliquis;    Past Surgical History:  Procedure Laterality Date  . ABDOMINAL HYSTERECTOMY    . CHOLECYSTECTOMY    . ROTATOR CUFF REPAIR     left     Family History  Problem Relation Age of Onset  . Heart disease Mother 61    CABG  . Heart attack Maternal Grandfather   . Heart attack Maternal Grandmother     Social History   Social History  . Marital status: Married    Spouse name: N/A  . Number of children: N/A  . Years of education: N/A   Occupational History  . Not on file.   Social History Main Topics  . Smoking status: Never Smoker  . Smokeless tobacco: Never Used  . Alcohol use 3.0 oz/week    5 Glasses of wine per week     Comment: glass of wine daily  . Drug use: No  . Sexual activity: Not on file   Other Topics Concern  . Not on file   Social History Narrative  . No narrative on file     Current Outpatient Prescriptions:  .  Cholecalciferol (VITAMIN D-1000 MAX ST) 1000 UNITS tablet, Take 1,000 Units by mouth daily. , Disp: , Rfl:  .  ELIQUIS 5 MG TABS tablet, TAKE ONE (1) TABLET BY MOUTH TWO (2) TIMES DAILY, Disp: 180 tablet, Rfl: 3 .   FLUoxetine (PROZAC) 40 MG capsule, TAKE 1 CAPSULE EVERY DAY, Disp: 90 capsule, Rfl: 3 .  levothyroxine (SYNTHROID, LEVOTHROID) 100 MCG tablet, TAKE 1 TABLET EVERY DAY, Disp: 90 tablet, Rfl: 3 .  losartan (COZAAR) 25 MG tablet, Take 1 tablet (25 mg total) by mouth daily., Disp: 90 tablet, Rfl: 3 .  metoprolol tartrate (LOPRESSOR) 25 MG tablet, Take 1/2 tablet twice daily., Disp: 60 tablet, Rfl: 0 .  omeprazole (PRILOSEC) 40 MG capsule, TAKE 1 CAPSULE EVERY DAY, Disp: 90 capsule, Rfl: 3 .  phenazopyridine (PYRIDIUM) 100 MG tablet, Take 1 tablet (100 mg total) by mouth 3 (three) times daily as needed for pain. (Patient not taking: Reported on 07/28/2016), Disp: 15 tablet, Rfl: 0 .  simvastatin (ZOCOR) 40 MG tablet, TAKE 1 TABLET EVERY DAY, Disp: 90 tablet, Rfl: 3  Allergies  Allergen Reactions  . Sulfa Antibiotics Other (See Comments)  . Sulfacetamide Sodium     Other reaction(s): Other (See Comments)     Review of Systems  Constitutional: Positive for malaise/fatigue. Negative for chills, fever and weight loss.  HENT: Negative for hearing loss.   Eyes: Negative for blurred vision and double vision.  Respiratory: Negative for cough, shortness of breath and wheezing.   Cardiovascular: Negative for chest pain, palpitations and leg swelling.  Gastrointestinal: Positive for heartburn. Negative for abdominal pain and blood in stool.  Genitourinary: Negative for dysuria, frequency and urgency.  Skin: Negative for rash.  Neurological: Positive for weakness. Negative for dizziness, tremors and headaches.  Psychiatric/Behavioral: Positive for depression. The patient is not nervous/anxious and does not have insomnia.       Objective  Vitals:   07/28/16 1424 07/28/16 1509  BP: (!) 162/79 (!) 150/65  Pulse: (!) 50 (!) 56  Resp: 16   Temp: 98.2 F (36.8 C)   TempSrc: Oral   Weight: 147 lb (66.7 kg)   Height: 5\' 3"  (1.6 m)     Physical Exam  Constitutional: She is oriented to person,  place, and time and well-developed, well-nourished, and in no distress. No distress.  HENT:  Head: Normocephalic and atraumatic.  Eyes: Conjunctivae and EOM are normal. Pupils are equal, round, and reactive to light. No scleral icterus.  Neck: Normal range of motion. Neck supple. No thyromegaly present.  Cardiovascular: Normal rate, regular rhythm and normal heart sounds.  Exam reveals no gallop and no friction rub.   No murmur heard. Pulmonary/Chest: Effort normal and breath sounds normal. No respiratory distress. She has no wheezes. She has no rales.  Abdominal: Soft. Bowel sounds are normal. She exhibits no distension and no mass. There is no tenderness.  Musculoskeletal: She exhibits no edema.  Lymphadenopathy:    She has no cervical adenopathy.  Neurological: She is alert and oriented to person, place, and time.  Vitals reviewed.      No results found for this or any previous visit (from the past 2160 hour(s)).   Assessment & Plan  Problem List Items Addressed This Visit      Cardiovascular and Mediastinum   Atrial fibrillation, unspecified - Primary   Relevant Medications   metoprolol tartrate (LOPRESSOR) 25 MG tablet   losartan (COZAAR) 25 MG tablet   HBP (high blood pressure)   Relevant Medications   metoprolol tartrate (LOPRESSOR) 25 MG tablet   losartan (COZAAR) 25 MG tablet   Other Relevant Orders   COMPLETE METABOLIC PANEL WITH GFR   Lipid Profile     Endocrine   Thyroid activity decreased   Relevant Medications   metoprolol tartrate (LOPRESSOR) 25 MG tablet   Other Relevant Orders   TSH     Other   Chronic fatigue   Relevant Orders   CBC with Differential   Depression    Other Visit Diagnoses   None.     Meds ordered this encounter  Medications  . metoprolol tartrate (LOPRESSOR) 25 MG tablet    Sig: Take 1/2 tablet twice daily.    Dispense:  60 tablet    Refill:  0  . losartan (COZAAR) 25 MG tablet    Sig: Take 1 tablet (25 mg total) by  mouth daily.    Dispense:  90 tablet    Refill:  3   1. Atrial fibrillation, unspecified  - metoprolol tartrate (LOPRESSOR) 25 MG tablet; Take 1/2 tablet twice daily.  Dispense: 60 tablet; Refill: 0 (decrease from 1 tab bid) 2. Hypothyroidism, unspecified hypothyroidism type Cont Levothyroxine - TSH  3. Depression  Cont Prozac 4. Chronic fatigue  - CBC with Differential  5. Essential hypertension  - losartan (COZAAR) 25 MG tablet; Take 1 tablet (25 mg total) by mouth daily.  Dispense: 90 tablet; Refill: 3 -  COMPLETE METABOLIC PANEL WITH GFR - Lipid Profile

## 2016-07-30 ENCOUNTER — Other Ambulatory Visit: Payer: Medicare PPO

## 2016-07-30 LAB — CBC WITH DIFFERENTIAL/PLATELET
BASOS PCT: 1 %
Basophils Absolute: 77 cells/uL (ref 0–200)
EOS ABS: 231 {cells}/uL (ref 15–500)
Eosinophils Relative: 3 %
HCT: 39.9 % (ref 35.0–45.0)
Hemoglobin: 13 g/dL (ref 11.7–15.5)
LYMPHS PCT: 37 %
Lymphs Abs: 2849 cells/uL (ref 850–3900)
MCH: 29 pg (ref 27.0–33.0)
MCHC: 32.6 g/dL (ref 32.0–36.0)
MCV: 88.9 fL (ref 80.0–100.0)
MONOS PCT: 8 %
MPV: 10.1 fL (ref 7.5–12.5)
Monocytes Absolute: 616 cells/uL (ref 200–950)
Neutro Abs: 3927 cells/uL (ref 1500–7800)
Neutrophils Relative %: 51 %
PLATELETS: 288 10*3/uL (ref 140–400)
RBC: 4.49 MIL/uL (ref 3.80–5.10)
RDW: 14.6 % (ref 11.0–15.0)
WBC: 7.7 10*3/uL (ref 3.8–10.8)

## 2016-07-30 LAB — COMPLETE METABOLIC PANEL WITH GFR
ALT: 14 U/L (ref 6–29)
AST: 19 U/L (ref 10–35)
Albumin: 4 g/dL (ref 3.6–5.1)
Alkaline Phosphatase: 61 U/L (ref 33–130)
BUN: 11 mg/dL (ref 7–25)
CHLORIDE: 104 mmol/L (ref 98–110)
CO2: 26 mmol/L (ref 20–31)
CREATININE: 0.76 mg/dL (ref 0.60–0.88)
Calcium: 9.2 mg/dL (ref 8.6–10.4)
GFR, EST NON AFRICAN AMERICAN: 73 mL/min (ref >=60)
GFR, Est African American: 84 mL/min (ref >=60)
Glucose, Bld: 104 mg/dL — ABNORMAL HIGH (ref 65–99)
POTASSIUM: 4.4 mmol/L (ref 3.5–5.3)
Sodium: 139 mmol/L (ref 135–146)
Total Bilirubin: 0.6 mg/dL (ref 0.2–1.2)
Total Protein: 6.6 g/dL (ref 6.1–8.1)

## 2016-07-30 LAB — LIPID PANEL
CHOL/HDL RATIO: 3.3 ratio (ref <=5.0)
CHOLESTEROL: 189 mg/dL (ref 125–200)
HDL: 58 mg/dL (ref >=46)
LDL Cholesterol: 105 mg/dL (ref <130)
TRIGLYCERIDES: 132 mg/dL (ref <150)
VLDL: 26 mg/dL (ref <30)

## 2016-07-30 LAB — TSH: TSH: 0.94 mIU/L

## 2016-07-31 ENCOUNTER — Other Ambulatory Visit: Payer: Self-pay | Admitting: Cardiovascular Disease

## 2016-07-31 DIAGNOSIS — I4891 Unspecified atrial fibrillation: Secondary | ICD-10-CM

## 2016-08-20 DIAGNOSIS — N390 Urinary tract infection, site not specified: Secondary | ICD-10-CM | POA: Diagnosis not present

## 2016-08-20 DIAGNOSIS — N200 Calculus of kidney: Secondary | ICD-10-CM | POA: Diagnosis not present

## 2016-08-20 DIAGNOSIS — N2889 Other specified disorders of kidney and ureter: Secondary | ICD-10-CM | POA: Diagnosis not present

## 2016-09-14 ENCOUNTER — Encounter: Payer: Self-pay | Admitting: Cardiovascular Disease

## 2016-09-14 ENCOUNTER — Ambulatory Visit (INDEPENDENT_AMBULATORY_CARE_PROVIDER_SITE_OTHER): Payer: Medicare PPO | Admitting: Cardiovascular Disease

## 2016-09-14 VITALS — BP 122/60 | HR 53 | Ht 63.0 in | Wt 145.0 lb

## 2016-09-14 DIAGNOSIS — I10 Essential (primary) hypertension: Secondary | ICD-10-CM | POA: Diagnosis not present

## 2016-09-14 DIAGNOSIS — E785 Hyperlipidemia, unspecified: Secondary | ICD-10-CM | POA: Diagnosis not present

## 2016-09-14 DIAGNOSIS — I4891 Unspecified atrial fibrillation: Secondary | ICD-10-CM | POA: Diagnosis not present

## 2016-09-14 DIAGNOSIS — M797 Fibromyalgia: Secondary | ICD-10-CM | POA: Diagnosis not present

## 2016-09-14 DIAGNOSIS — R5382 Chronic fatigue, unspecified: Secondary | ICD-10-CM

## 2016-09-14 DIAGNOSIS — R001 Bradycardia, unspecified: Secondary | ICD-10-CM
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
## 2016-09-14 NOTE — Patient Instructions (Addendum)

## 2016-10-22 ENCOUNTER — Encounter: Payer: Self-pay | Admitting: Family Medicine

## 2016-10-22 ENCOUNTER — Ambulatory Visit (INDEPENDENT_AMBULATORY_CARE_PROVIDER_SITE_OTHER): Payer: Medicare PPO | Admitting: Family Medicine

## 2016-10-22 VITALS — BP 150/70 | HR 60 | Temp 97.8°F | Resp 16 | Ht 63.0 in | Wt 147.0 lb

## 2016-10-22 DIAGNOSIS — Z23 Encounter for immunization: Secondary | ICD-10-CM | POA: Diagnosis not present

## 2016-10-22 DIAGNOSIS — I10 Essential (primary) hypertension: Secondary | ICD-10-CM

## 2016-10-22 DIAGNOSIS — E032 Hypothyroidism due to medicaments and other exogenous substances: Secondary | ICD-10-CM | POA: Diagnosis not present

## 2016-10-22 DIAGNOSIS — F3289 Other specified depressive episodes: Secondary | ICD-10-CM | POA: Diagnosis not present

## 2016-10-22 DIAGNOSIS — E782 Mixed hyperlipidemia: Secondary | ICD-10-CM

## 2016-10-22 DIAGNOSIS — I4891 Unspecified atrial fibrillation: Secondary | ICD-10-CM | POA: Diagnosis not present

## 2016-10-22 MED ORDER — LOSARTAN POTASSIUM 50 MG PO TABS: 25.0000 mg | ORAL_TABLET | Freq: Every day | ORAL | 3 refills | Status: DC

## 2016-10-22 NOTE — Progress Notes (Signed)
Name: Stacey Duncan   MRN: [id number]    DOB: [birth year]   Date:10/22/2016       Progress Note  Subjective  Chief Complaint  Chief Complaint  Patient presents with  . Hypothyroidism  . Hypertension    B/P ranges 150/108 pulse 59     HPI Here for f/u of HBP, elevated lipoids and depression.  BPs bounce from 458-592 systolic.  Alwsays higher in AM than PM.  She is taking her meds and feeling well overall.  Depression is doing well at present.  No problem-specific Assessment & Plan notes found for this encounter.   Past Medical History:  Diagnosis Date  . Fibromyalgia   . GERD (gastroesophageal reflux disease)   . HTN (hypertension)   . Hypothyroidism   . Osteoarthritis    a. bilateral knees  . PAF (paroxysmal atrial fibrillation) (HCC)    a. on Eliquis;    Past Surgical History:  Procedure Laterality Date  . ABDOMINAL HYSTERECTOMY    . CHOLECYSTECTOMY    . ROTATOR CUFF REPAIR     left     Family History  Problem Relation Age of Onset  . Heart disease Mother 27    CABG  . Heart attack Maternal Grandfather   . Heart attack Maternal Grandmother     Social History   Social History  . Marital status: Married    Spouse name: N/A  . Number of children: N/A  . Years of education: N/A   Occupational History  . Not on file.   Social History Main Topics  . Smoking status: Never Smoker  . Smokeless tobacco: Never Used  . Alcohol use 3.0 oz/week    5 Glasses of wine per week     Comment: glass of wine daily  . Drug use: No  . Sexual activity: Not on file   Other Topics Concern  . Not on file   Social History Narrative  . No narrative on file     Current Outpatient Prescriptions:  .  Cholecalciferol (VITAMIN D-1000 MAX ST) 1000 UNITS tablet, Take 1,000 Units by mouth daily. , Disp: , Rfl:  .  ELIQUIS 5 MG TABS tablet, TAKE ONE (1) TABLET BY MOUTH TWO (2) TIMES DAILY, Disp: 180 tablet, Rfl: 3 .  FLUoxetine (PROZAC) 40 MG capsule, TAKE 1 CAPSULE EVERY  DAY, Disp: 90 capsule, Rfl: 3 .  levothyroxine (SYNTHROID, LEVOTHROID) 100 MCG tablet, TAKE 1 TABLET EVERY DAY, Disp: 90 tablet, Rfl: 3 .  losartan (COZAAR) 50 MG tablet, Take 0.5 tablets (25 mg total) by mouth daily., Disp: 90 tablet, Rfl: 3 .  metoprolol tartrate (LOPRESSOR) 25 MG tablet, TAKE 1 TABLET TWICE DAILY (Patient taking differently: TAKES 1/2 TABLET TWICE A DAY), Disp: 180 tablet, Rfl: 3 .  omeprazole (PRILOSEC) 40 MG capsule, TAKE 1 CAPSULE EVERY DAY, Disp: 90 capsule, Rfl: 3 .  phenazopyridine (PYRIDIUM) 100 MG tablet, Take 1 tablet (100 mg total) by mouth 3 (three) times daily as needed for pain., Disp: 15 tablet, Rfl: 0 .  simvastatin (ZOCOR) 40 MG tablet, TAKE 1 TABLET EVERY DAY, Disp: 90 tablet, Rfl: 3  Allergies  Allergen Reactions  . Sulfa Antibiotics Other (See Comments)  . Sulfacetamide Sodium     Other reaction(s): Other (See Comments)     Review of Systems  Constitutional: Negative for chills, fever, malaise/fatigue and weight loss.  HENT: Negative for hearing loss.   Eyes: Negative for blurred vision and double vision.  Respiratory: Negative for cough, shortness of  breath and wheezing.   Cardiovascular: Negative for chest pain, palpitations and leg swelling.  Gastrointestinal: Negative for abdominal pain, blood in stool and heartburn.  Genitourinary: Negative for dysuria, frequency and urgency.  Musculoskeletal: Negative for joint pain and myalgias.  Skin: Negative for rash.  Neurological: Negative for dizziness, tremors, weakness and headaches.  Psychiatric/Behavioral: Positive for depression (better). The patient is not nervous/anxious and does not have insomnia.       Objective  Vitals:   10/22/16 1050 10/22/16 1133  BP: 117/74 (!) 150/70  Pulse: (!) 52 60  Resp: 16   Temp: 97.8 F (36.6 C)   TempSrc: Oral   Weight: 147 lb (66.7 kg)   Height: 5' 3"  (1.6 m)     Physical Exam  Constitutional: She is oriented to person, place, and time and  well-developed, well-nourished, and in no distress. No distress.  HENT:  Head: Normocephalic and atraumatic.  Eyes: Conjunctivae and EOM are normal. Pupils are equal, round, and reactive to light. No scleral icterus.  Neck: Normal range of motion. Neck supple. Carotid bruit is not present. No thyromegaly present.  Cardiovascular: Normal rate, regular rhythm and normal heart sounds.  Exam reveals no gallop and no friction rub.   No murmur heard. Pulmonary/Chest: Effort normal and breath sounds normal. No respiratory distress. She has no wheezes. She has no rales.  Abdominal: Soft. Bowel sounds are normal. She exhibits no distension and no mass. There is no tenderness.  Musculoskeletal: She exhibits no edema.  Lymphadenopathy:    She has no cervical adenopathy.  Neurological: She is alert and oriented to person, place, and time.  Vitals reviewed.      Recent Results (from the past 2160 hour(s))  CBC with Differential     Status: None   Collection Time: 07/28/16  8:24 AM  Result Value Ref Range   WBC 7.7 3.8 - 10.8 K/uL   RBC 4.49 3.80 - 5.10 MIL/uL   Hemoglobin 13.0 11.7 - 15.5 g/dL   HCT 39.9 35.0 - 45.0 %   MCV 88.9 80.0 - 100.0 fL   MCH 29.0 27.0 - 33.0 pg   MCHC 32.6 32.0 - 36.0 g/dL   RDW 14.6 11.0 - 15.0 %   Platelets 288 140 - 400 K/uL   MPV 10.1 7.5 - 12.5 fL   Neutro Abs 3,927 1,500 - 7,800 cells/uL   Lymphs Abs 2,849 850 - 3,900 cells/uL   Monocytes Absolute 616 200 - 950 cells/uL   Eosinophils Absolute 231 15 - 500 cells/uL   Basophils Absolute 77 0 - 200 cells/uL   Neutrophils Relative % 51 %   Lymphocytes Relative 37 %   Monocytes Relative 8 %   Eosinophils Relative 3 %   Basophils Relative 1 %   Smear Review Criteria for review not met     Comment: ** Please note change in unit of measure and reference range(s). **  COMPLETE METABOLIC PANEL WITH GFR     Status: Abnormal   Collection Time: 07/28/16  8:24 AM  Result Value Ref Range   Sodium 139 135 - 146  mmol/L   Potassium 4.4 3.5 - 5.3 mmol/L   Chloride 104 98 - 110 mmol/L   CO2 26 20 - 31 mmol/L   Glucose, Bld 104 (H) 65 - 99 mg/dL   BUN 11 7 - 25 mg/dL   Creat 0.76 0.60 - 0.88 mg/dL    Comment:   For patients > or = 80 years of age: The upper  reference limit for Creatinine is approximately 13% higher for people identified as African-American.      Total Bilirubin 0.6 0.2 - 1.2 mg/dL   Alkaline Phosphatase 61 33 - 130 U/L   AST 19 10 - 35 U/L   ALT 14 6 - 29 U/L   Total Protein 6.6 6.1 - 8.1 g/dL   Albumin 4.0 3.6 - 5.1 g/dL   Calcium 9.2 8.6 - 10.4 mg/dL   GFR, Est African American 84 >=60 mL/min   GFR, Est Non African American 73 >=60 mL/min  TSH     Status: None   Collection Time: 07/28/16  8:24 AM  Result Value Ref Range   TSH 0.94 mIU/L    Comment:   Reference Range   > or = 20 Years  0.40-4.50   Pregnancy Range First trimester  0.26-2.66 Second trimester 0.55-2.73 Third trimester  0.43-2.91     Lipid Profile     Status: None   Collection Time: 07/28/16  8:24 AM  Result Value Ref Range   Cholesterol 189 125 - 200 mg/dL   Triglycerides 132 <150 mg/dL   HDL 58 >=46 mg/dL   Total CHOL/HDL Ratio 3.3 <=5.0 Ratio   VLDL 26 <30 mg/dL   LDL Cholesterol 105 <130 mg/dL    Comment:   Total Cholesterol/HDL Ratio:CHD Risk                        Coronary Heart Disease Risk Table                                        Men       Women          1/2 Average Risk              3.4        3.3              Average Risk              5.0        4.4           2X Average Risk              9.6        7.1           3X Average Risk             23.4       11.0 Use the calculated Patient Ratio above and the CHD Risk table  to determine the patient's CHD Risk.      Assessment & Plan  Problem List Items Addressed This Visit      Cardiovascular and Mediastinum   Atrial fibrillation (HCC)   Relevant Medications   losartan (COZAAR) 50 MG tablet   HBP (high blood pressure)    Relevant Medications   losartan (COZAAR) 50 MG tablet     Endocrine   Thyroid activity decreased     Other   Depression   Need for influenza vaccination - Primary   Relevant Orders   Flu vaccine HIGH DOSE PF (Fluzone High dose) (Completed)   Hyperlipidemia   Relevant Medications   losartan (COZAAR) 50 MG tablet    Other Visit Diagnoses   None.     Meds ordered this encounter  Medications  . losartan (COZAAR) 50 MG tablet  Sig: Take 0.5 tablets (25 mg total) by mouth daily.    Dispense:  90 tablet    Refill:  3   1. Need for influenza vaccination  - Flu vaccine HIGH DOSE PF (Fluzone High dose)  2. Essential hypertension Cont metoprolol - losartan (COZAAR) 50 MG tablet; Take 0.5 tablets (25 mg total) by mouth daily.  Dispense: 90 tablet; Refill: 3  3. Atrial fibrillation, unspecified type ([place])   4. Hypothyroidism due to non-medication exogenous substances Cont Levothyroxine 5. Other depression Cont Prozac.  6. Mixed hyperlipidemia

## 2016-11-09 ENCOUNTER — Telehealth: Payer: Self-pay | Admitting: Family Medicine

## 2016-11-09 NOTE — Telephone Encounter (Signed)
Patient advised to call back on Thursday AM with bp readings.[place]

## 2016-11-09 NOTE — Telephone Encounter (Signed)
Confirm that she is taking 1/2 tablet of 50 mg Losartan now.  If correct, have her go back to 1 tablet of this daily.  Keep eye on BP.  Will plan to see her back for her reg. appt unless BP stays up.-jh

## 2016-11-09 NOTE — Telephone Encounter (Signed)
Check both #s of BP and call back reports on Thurs AM.  Cont the 50 mg. Of Losartan for now.-jh

## 2016-11-09 NOTE — Telephone Encounter (Signed)
As per pt she is taking 1 tab of 50 mg daily also advised her to record both number for B/P.

## 2016-11-09 NOTE — Telephone Encounter (Signed)
Pt. Called states that medicine was changed about 2 weeks ago Pt. States that since Friday  BP was up. Pt. Call back #  Is  [phone]   Saturday- Left  Arm  171- Right  Arm 174 Monday  Left  Arm 165- Right  Arm 176

## 2016-11-12 ENCOUNTER — Telehealth: Payer: Self-pay | Admitting: *Deleted

## 2016-11-12 ENCOUNTER — Other Ambulatory Visit: Payer: Self-pay | Admitting: *Deleted

## 2016-11-12 DIAGNOSIS — I10 Essential (primary) hypertension: Secondary | ICD-10-CM

## 2016-11-12 MED ORDER — LOSARTAN POTASSIUM 100 MG PO TABS: 100.0000 mg | ORAL_TABLET | Freq: Every day | ORAL | 3 refills | Status: DC

## 2016-11-12 NOTE — Telephone Encounter (Signed)
Patient brought blood pressure reading by office this am. After reviewing Dr. Juanetta GoslingHawkins adjusted Losartan from 50 mg to 100 mg.

## 2016-11-23 ENCOUNTER — Telehealth: Payer: Self-pay | Admitting: Family Medicine

## 2016-11-23 NOTE — Telephone Encounter (Signed)
Pt asked what Dr. Juanetta GoslingHawkins would recommend for dry cough, stuffy nose and tickle in throat.  Her call back number is [phone]

## 2016-11-23 NOTE — Telephone Encounter (Signed)
Either Claritin, Zyrtec or Allegra for the nose and Delsym cough syrup for the cough

## 2016-11-30 ENCOUNTER — Telehealth: Payer: Self-pay | Admitting: Family Medicine

## 2016-11-30 ENCOUNTER — Other Ambulatory Visit: Payer: Self-pay | Admitting: Family Medicine

## 2016-11-30 DIAGNOSIS — I10 Essential (primary) hypertension: Secondary | ICD-10-CM

## 2016-11-30 MED ORDER — LOSARTAN POTASSIUM 100 MG PO TABS: 100.0000 mg | ORAL_TABLET | Freq: Every day | ORAL | 3 refills | Status: DC

## 2016-11-30 NOTE — Telephone Encounter (Signed)
Pt needs a prescription for losartan 100 mg sent to [facility].  Her call back number is [phone]

## 2016-12-03 ENCOUNTER — Other Ambulatory Visit: Payer: Self-pay | Admitting: *Deleted

## 2016-12-03 DIAGNOSIS — I10 Essential (primary) hypertension: Secondary | ICD-10-CM

## 2016-12-03 MED ORDER — LOSARTAN POTASSIUM 100 MG PO TABS: 100.0000 mg | ORAL_TABLET | Freq: Every day | ORAL | 0 refills | Status: DC

## 2016-12-14 ENCOUNTER — Encounter: Payer: Self-pay | Admitting: Family Medicine

## 2016-12-14 ENCOUNTER — Ambulatory Visit (INDEPENDENT_AMBULATORY_CARE_PROVIDER_SITE_OTHER): Payer: Medicare PPO | Admitting: Family Medicine

## 2016-12-14 VITALS — BP 145/60 | HR 54 | Temp 98.1°F | Resp 16 | Ht 63.0 in | Wt 143.0 lb

## 2016-12-14 DIAGNOSIS — I4891 Unspecified atrial fibrillation: Secondary | ICD-10-CM | POA: Diagnosis not present

## 2016-12-14 DIAGNOSIS — E032 Hypothyroidism due to medicaments and other exogenous substances: Secondary | ICD-10-CM

## 2016-12-14 DIAGNOSIS — M797 Fibromyalgia: Secondary | ICD-10-CM | POA: Diagnosis not present

## 2016-12-14 DIAGNOSIS — E782 Mixed hyperlipidemia: Secondary | ICD-10-CM

## 2016-12-14 DIAGNOSIS — F3289 Other specified depressive episodes: Secondary | ICD-10-CM | POA: Diagnosis not present

## 2016-12-14 DIAGNOSIS — I10 Essential (primary) hypertension: Secondary | ICD-10-CM

## 2016-12-14 DIAGNOSIS — R5382 Chronic fatigue, unspecified: Secondary | ICD-10-CM

## 2016-12-14 MED ORDER — AMLODIPINE BESYLATE 2.5 MG PO TABS: 2.5000 mg | ORAL_TABLET | Freq: Every day | ORAL | 6 refills | Status: DC

## 2016-12-14 NOTE — Progress Notes (Signed)
Name: Stacey Duncan   MRN: [id number]    DOB: 16-Feb-1931   Date:12/14/2016       Progress Note  Subjective  Chief Complaint  Chief Complaint  Patient presents with  . Hypothyroidism  . Hypertension    HPI Here for f/u of HBP and hypothyroidism.  Last thyroid check was in normal range.   BP s at home have been 130-160 range. No problem-specific Assessment & Plan notes found for this encounter.   Past Medical History:  Diagnosis Date  . Fibromyalgia   . GERD (gastroesophageal reflux disease)   . HTN (hypertension)   . Hypothyroidism   . Osteoarthritis    a. bilateral knees  . PAF (paroxysmal atrial fibrillation) (HCC)    a. on Eliquis;    Past Surgical History:  Procedure Laterality Date  . ABDOMINAL HYSTERECTOMY    . CHOLECYSTECTOMY    . ROTATOR CUFF REPAIR     left     Family History  Problem Relation Age of Onset  . Heart disease Mother 4975    CABG  . Heart attack Maternal Grandfather   . Heart attack Maternal Grandmother     Social History   Social History  . Marital status: Married    Spouse name: N/A  . Number of children: N/A  . Years of education: N/A   Occupational History  . Not on file.   Social History Main Topics  . Smoking status: Never Smoker  . Smokeless tobacco: Never Used  . Alcohol use 3.0 oz/week    5 Glasses of wine per week     Comment: glass of wine daily  . Drug use: No  . Sexual activity: Not on file   Other Topics Concern  . Not on file   Social History Narrative  . No narrative on file     Current Outpatient Prescriptions:  .  Cholecalciferol (VITAMIN D-1000 MAX ST) 1000 UNITS tablet, Take 1,000 Units by mouth daily. , Disp: , Rfl:  .  ELIQUIS 5 MG TABS tablet, TAKE ONE (1) TABLET BY MOUTH TWO (2) TIMES DAILY, Disp: 180 tablet, Rfl: 3 .  FLUoxetine (PROZAC) 40 MG capsule, TAKE 1 CAPSULE EVERY DAY, Disp: 90 capsule, Rfl: 3 .  levothyroxine (SYNTHROID, LEVOTHROID) 100 MCG tablet, TAKE 1 TABLET EVERY DAY, Disp: 90  tablet, Rfl: 3 .  losartan (COZAAR) 100 MG tablet, Take 1 tablet (100 mg total) by mouth daily., Disp: 30 tablet, Rfl: 0 .  metoprolol tartrate (LOPRESSOR) 25 MG tablet, TAKE 1 TABLET TWICE DAILY (Patient taking differently: TAKES 1/2 TABLET TWICE A DAY), Disp: 180 tablet, Rfl: 3 .  omeprazole (PRILOSEC) 40 MG capsule, TAKE 1 CAPSULE EVERY DAY, Disp: 90 capsule, Rfl: 3 .  phenazopyridine (PYRIDIUM) 100 MG tablet, Take 1 tablet (100 mg total) by mouth 3 (three) times daily as needed for pain., Disp: 15 tablet, Rfl: 0 .  simvastatin (ZOCOR) 40 MG tablet, TAKE 1 TABLET EVERY DAY, Disp: 90 tablet, Rfl: 3 .  amLODipine (NORVASC) 2.5 MG tablet, Take 1 tablet (2.5 mg total) by mouth daily., Disp: 30 tablet, Rfl: 6  Allergies  Allergen Reactions  . Sulfa Antibiotics Other (See Comments)  . Sulfacetamide Sodium     Other reaction(s): Other (See Comments)     Review of Systems  Constitutional: Negative for chills, fever, malaise/fatigue and weight loss.  HENT: Negative for hearing loss and tinnitus.   Eyes: Negative for blurred vision and double vision.  Respiratory: Negative for cough, shortness of breath  and wheezing.   Cardiovascular: Negative for chest pain, palpitations and leg swelling.  Gastrointestinal: Negative for abdominal pain, blood in stool, heartburn and nausea.  Genitourinary: Negative for dysuria, frequency and urgency.  Musculoskeletal: Negative for joint pain and myalgias.  Skin: Negative for rash.  Neurological: Negative for dizziness, tingling, tremors, weakness and headaches.        Balance may  Be off a little at times.  Psychiatric/Behavioral: Positive for depression.      Objective  Vitals:   12/14/16 1120 12/14/16 1208  BP: 127/73 (!) 145/60  Pulse: (!) 54   Resp: 16   Temp: 98.1 F (36.7 C)   Weight: 143 lb (64.9 kg)   Height: 5\' 3"  (1.6 m)     Physical Exam  Constitutional: She is oriented to person, place, and time and well-developed, well-nourished,  and in no distress. No distress.  HENT:  Head: Normocephalic and atraumatic.  Eyes: Conjunctivae and EOM are normal. Pupils are equal, round, and reactive to light. No scleral icterus.  Neck: Normal range of motion. Neck supple. Carotid bruit is not present. No thyromegaly present.  Cardiovascular: Normal rate, regular rhythm and normal heart sounds.  Exam reveals no gallop and no friction rub.   No murmur heard. Pulmonary/Chest: Effort normal and breath sounds normal. No respiratory distress. She has no wheezes. She has no rales.  Abdominal: Soft. Bowel sounds are normal. She exhibits no distension and no mass. There is no tenderness.  Musculoskeletal: She exhibits no edema.  Lymphadenopathy:    She has no cervical adenopathy.  Neurological: She is alert and oriented to person, place, and time.  Vitals reviewed.      No results found for this or any previous visit (from the past 2160 hour(s)).   Assessment & Plan  Problem List Items Addressed This Visit      Cardiovascular and Mediastinum   Atrial fibrillation (HCC)   Relevant Medications   amLODipine (NORVASC) 2.5 MG tablet   HBP (high blood pressure) - Primary   Relevant Medications   amLODipine (NORVASC) 2.5 MG tablet     Endocrine   Thyroid activity decreased     Other   Chronic fatigue   Depression   Fibromyalgia   Hyperlipidemia   Relevant Medications   amLODipine (NORVASC) 2.5 MG tablet      Meds ordered this encounter  Medications  . amLODipine (NORVASC) 2.5 MG tablet    Sig: Take 1 tablet (2.5 mg total) by mouth daily.    Dispense:  30 tablet    Refill:  6   1. Essential hypertension  - amLODipine (NORVASC) 2.5 MG tablet; Take 1 tablet (2.5 mg total) by mouth daily.  Dispense: 30 tablet; Refill: 6 Cont Metoprolol and Losartan 2. Atrial fibrillation, unspecified type (HCC)   3. Hypothyroidism due to non-medication exogenous substances Cont Levothyroxine.  4. Other depression Cont Prozac  5.  Chronic fatigue   6. Mixed hyperlipidemia   7. Fibromyalgia

## 2017-01-28 ENCOUNTER — Ambulatory Visit: Payer: Medicare PPO | Admitting: Family Medicine

## 2017-01-29 ENCOUNTER — Ambulatory Visit: Payer: Medicare PPO | Admitting: Family Medicine

## 2017-02-01 ENCOUNTER — Ambulatory Visit (INDEPENDENT_AMBULATORY_CARE_PROVIDER_SITE_OTHER): Payer: Medicare PPO | Admitting: Family Medicine

## 2017-02-01 ENCOUNTER — Encounter: Payer: Self-pay | Admitting: Family Medicine

## 2017-02-01 VITALS — BP 155/65 | HR 60 | Temp 97.6°F | Resp 16 | Ht 63.0 in | Wt 150.0 lb

## 2017-02-01 DIAGNOSIS — R5382 Chronic fatigue, unspecified: Secondary | ICD-10-CM

## 2017-02-01 DIAGNOSIS — F3289 Other specified depressive episodes: Secondary | ICD-10-CM

## 2017-02-01 DIAGNOSIS — E782 Mixed hyperlipidemia: Secondary | ICD-10-CM | POA: Diagnosis not present

## 2017-02-01 DIAGNOSIS — M797 Fibromyalgia: Secondary | ICD-10-CM | POA: Diagnosis not present

## 2017-02-01 DIAGNOSIS — I10 Essential (primary) hypertension: Secondary | ICD-10-CM | POA: Diagnosis not present

## 2017-02-01 DIAGNOSIS — E032 Hypothyroidism due to medicaments and other exogenous substances: Secondary | ICD-10-CM | POA: Diagnosis not present

## 2017-02-01 MED ORDER — AMLODIPINE BESYLATE 5 MG PO TABS: 5.0000 mg | ORAL_TABLET | Freq: Every day | ORAL | 6 refills | Status: DC

## 2017-02-01 MED ORDER — SIMVASTATIN 40 MG PO TABS: ORAL_TABLET | ORAL | 12 refills | Status: DC

## 2017-02-01 NOTE — Progress Notes (Signed)
Name: Stacey Duncan   MRN: [id number]    DOB: 09-12-1931   Date:02/01/2017       Progress Note  Subjective  Chief Complaint  Chief Complaint  Patient presents with  . Hypertension  . Hypothyroidism    HPI Here for f/u of HBP, a. Fib., elevated lipids, and depression.  Feeling good on and off.    No specific c/o.    No problem-specific Assessment & Plan notes found for this encounter.   Past Medical History:  Diagnosis Date  . Fibromyalgia   . GERD (gastroesophageal reflux disease)   . HTN (hypertension)   . Hypothyroidism   . Osteoarthritis    a. bilateral knees  . PAF (paroxysmal atrial fibrillation) (HCC)    a. on Eliquis;    Past Surgical History:  Procedure Laterality Date  . ABDOMINAL HYSTERECTOMY    . CHOLECYSTECTOMY    . ROTATOR CUFF REPAIR     left     Family History  Problem Relation Age of Onset  . Heart disease Mother 37    CABG  . Heart attack Maternal Grandfather   . Heart attack Maternal Grandmother     Social History   Social History  . Marital status: Married    Spouse name: N/A  . Number of children: N/A  . Years of education: N/A   Occupational History  . Not on file.   Social History Main Topics  . Smoking status: Never Smoker  . Smokeless tobacco: Never Used  . Alcohol use 3.0 oz/week    5 Glasses of wine per week     Comment: glass of wine daily  . Drug use: No  . Sexual activity: Not on file   Other Topics Concern  . Not on file   Social History Narrative  . No narrative on file     Current Outpatient Prescriptions:  .  amLODipine (NORVASC) 5 MG tablet, Take 1 tablet (5 mg total) by mouth daily., Disp: 30 tablet, Rfl: 6 .  Cholecalciferol (VITAMIN D-1000 MAX ST) 1000 UNITS tablet, Take 1,000 Units by mouth daily. , Disp: , Rfl:  .  ELIQUIS 5 MG TABS tablet, TAKE ONE (1) TABLET BY MOUTH TWO (2) TIMES DAILY, Disp: 180 tablet, Rfl: 3 .  FLUoxetine (PROZAC) 40 MG capsule, TAKE 1 CAPSULE EVERY DAY, Disp: 90 capsule,  Rfl: 3 .  levothyroxine (SYNTHROID, LEVOTHROID) 100 MCG tablet, TAKE 1 TABLET EVERY DAY, Disp: 90 tablet, Rfl: 3 .  losartan (COZAAR) 100 MG tablet, Take 1 tablet (100 mg total) by mouth daily., Disp: 30 tablet, Rfl: 0 .  metoprolol tartrate (LOPRESSOR) 25 MG tablet, TAKE 1 TABLET TWICE DAILY (Patient taking differently: TAKES 1/2 TABLET TWICE A DAY), Disp: 180 tablet, Rfl: 3 .  omeprazole (PRILOSEC) 40 MG capsule, TAKE 1 CAPSULE EVERY DAY, Disp: 90 capsule, Rfl: 3 .  phenazopyridine (PYRIDIUM) 100 MG tablet, Take 1 tablet (100 mg total) by mouth 3 (three) times daily as needed for pain., Disp: 15 tablet, Rfl: 0 .  simvastatin (ZOCOR) 40 MG tablet, Take 1 tablet every other day as directed., Disp: 30 tablet, Rfl: 12  Allergies  Allergen Reactions  . Sulfa Antibiotics Other (See Comments)  . Sulfacetamide Sodium     Other reaction(s): Other (See Comments)     Review of Systems  Constitutional: Negative for chills, fever, malaise/fatigue and weight loss.  HENT: Negative for hearing loss and tinnitus.   Eyes: Negative for blurred vision and double vision.  Respiratory: Negative for  cough, shortness of breath and wheezing.   Cardiovascular: Positive for palpitations (rare). Negative for chest pain and leg swelling.  Gastrointestinal: Negative for abdominal pain, blood in stool, heartburn and nausea.  Genitourinary: Negative for dysuria, frequency and urgency.  Musculoskeletal: Positive for back pain and myalgias (Legs hurt, esp at night.). Negative for joint pain.  Skin: Negative for rash.  Neurological: Negative for dizziness, tingling, tremors, weakness and headaches.      Objective  Vitals:   02/01/17 1452 02/01/17 1532  BP: (!) 136/53 (!) 155/65  Pulse: (!) 55 60  Resp: 16   Temp: 97.6 F (36.4 C)   TempSrc: Oral   Weight: 150 lb (68 kg)   Height: 5\' 3"  (1.6 m)     Physical Exam  Constitutional: She is oriented to person, place, and time and well-developed,  well-nourished, and in no distress. No distress.  HENT:  Head: Normocephalic and atraumatic.  Eyes: Conjunctivae and EOM are normal. Pupils are equal, round, and reactive to light. No scleral icterus.  Neck: Normal range of motion. Neck supple. Carotid bruit is not present. No thyromegaly present.  Cardiovascular: Normal rate, regular rhythm and normal heart sounds.  Exam reveals no gallop and no friction rub.   No murmur heard. Pulmonary/Chest: Effort normal and breath sounds normal. No respiratory distress. She has no wheezes. She has no rales.  Musculoskeletal: She exhibits no edema.  Lymphadenopathy:    She has no cervical adenopathy.  Neurological: She is alert and oriented to person, place, and time.  Vitals reviewed.      No results found for this or any previous visit (from the past 2160 hour(s)).   Assessment & Plan  Problem List Items Addressed This Visit      Cardiovascular and Mediastinum   HBP (high blood pressure) - Primary   Relevant Medications   amLODipine (NORVASC) 5 MG tablet   simvastatin (ZOCOR) 40 MG tablet   Other Relevant Orders   COMPLETE METABOLIC PANEL WITH GFR     Endocrine   Thyroid activity decreased   Relevant Orders   TSH     Other   Chronic fatigue   Relevant Orders   CBC with Differential   Depression   Fibromyalgia   Hyperlipidemia   Relevant Medications   amLODipine (NORVASC) 5 MG tablet   simvastatin (ZOCOR) 40 MG tablet   Other Relevant Orders   Lipid Profile      Meds ordered this encounter  Medications  . amLODipine (NORVASC) 5 MG tablet    Sig: Take 1 tablet (5 mg total) by mouth daily.    Dispense:  30 tablet    Refill:  6  . simvastatin (ZOCOR) 40 MG tablet    Sig: Take 1 tablet every other day as directed.    Dispense:  30 tablet    Refill:  12   1. Essential hypertension Cont L:osartan ands Metoprolol - amLODipine (NORVASC) 5 MG tablet; Take 1 tablet (5 mg total) by mouth daily.  Dispense: 30 tablet;  Refill: 6 - COMPLETE METABOLIC PANEL WITH GFR  2. Hypothyroidism due to non-medication exogenous substances Cont Levothyhroxine- TSH  3. Other depression Cont Prozac  4. Chronic fatigue  - CBC with Differential  5. Mixed hyperlipidemia  - Lipid Profile - simvastatin (ZOCOR) 40 MG tablet; Take 1 tablet every other day as directed.  Dispense: 30 tablet; Refill: 12 - decreased from 1 tablet each night 6. Fibromyalgia

## 2017-02-03 ENCOUNTER — Other Ambulatory Visit: Payer: Medicare PPO

## 2017-02-03 DIAGNOSIS — E782 Mixed hyperlipidemia: Secondary | ICD-10-CM | POA: Diagnosis not present

## 2017-02-03 DIAGNOSIS — R5382 Chronic fatigue, unspecified: Secondary | ICD-10-CM | POA: Diagnosis not present

## 2017-02-03 DIAGNOSIS — I10 Essential (primary) hypertension: Secondary | ICD-10-CM | POA: Diagnosis not present

## 2017-02-03 DIAGNOSIS — E032 Hypothyroidism due to medicaments and other exogenous substances: Secondary | ICD-10-CM | POA: Diagnosis not present

## 2017-02-04 LAB — CBC WITH DIFFERENTIAL/PLATELET
BASOS ABS: 0 {cells}/uL (ref 0–200)
BASOS PCT: 0 %
EOS PCT: 3 %
Eosinophils Absolute: 213 cells/uL (ref 15–500)
HCT: 40.9 % (ref 35.0–45.0)
HEMOGLOBIN: 13.4 g/dL (ref 11.7–15.5)
LYMPHS ABS: 2769 {cells}/uL (ref 850–3900)
Lymphocytes Relative: 39 %
MCH: 30.2 pg (ref 27.0–33.0)
MCHC: 32.8 g/dL (ref 32.0–36.0)
MCV: 92.3 fL (ref 80.0–100.0)
MPV: 9.6 fL (ref 7.5–12.5)
Monocytes Absolute: 568 cells/uL (ref 200–950)
Monocytes Relative: 8 %
NEUTROS ABS: 3550 {cells}/uL (ref 1500–7800)
Neutrophils Relative %: 50 %
Platelets: 304 10*3/uL (ref 140–400)
RBC: 4.43 MIL/uL (ref 3.80–5.10)
RDW: 14.2 % (ref 11.0–15.0)
WBC: 7.1 10*3/uL (ref 3.8–10.8)

## 2017-02-04 LAB — COMPLETE METABOLIC PANEL WITH GFR
ALBUMIN: 4.1 g/dL (ref 3.6–5.1)
ALK PHOS: 65 U/L (ref 33–130)
ALT: 14 U/L (ref 6–29)
AST: 21 U/L (ref 10–35)
BUN: 11 mg/dL (ref 7–25)
CO2: 29 mmol/L (ref 20–31)
Calcium: 9 mg/dL (ref 8.6–10.4)
Chloride: 102 mmol/L (ref 98–110)
Creat: 0.9 mg/dL — ABNORMAL HIGH (ref 0.60–0.88)
GFR, EST AFRICAN AMERICAN: 68 mL/min (ref >=60)
GFR, EST NON AFRICAN AMERICAN: 59 mL/min — AB (ref >=60)
GLUCOSE: 104 mg/dL — AB (ref 65–99)
POTASSIUM: 4.4 mmol/L (ref 3.5–5.3)
SODIUM: 139 mmol/L (ref 135–146)
Total Bilirubin: 0.6 mg/dL (ref 0.2–1.2)
Total Protein: 6.8 g/dL (ref 6.1–8.1)

## 2017-02-04 LAB — LIPID PANEL
Cholesterol: 200 mg/dL — ABNORMAL HIGH (ref <200)
HDL: 49 mg/dL — ABNORMAL LOW (ref >50)
LDL Cholesterol: 123 mg/dL — ABNORMAL HIGH (ref <100)
Total CHOL/HDL Ratio: 4.1 Ratio (ref <5.0)
Triglycerides: 142 mg/dL (ref <150)
VLDL: 28 mg/dL (ref <30)

## 2017-02-04 LAB — TSH: TSH: 1.56 mIU/L

## 2017-03-09 ENCOUNTER — Encounter: Payer: Self-pay | Admitting: Family Medicine

## 2017-03-09 ENCOUNTER — Ambulatory Visit (INDEPENDENT_AMBULATORY_CARE_PROVIDER_SITE_OTHER): Payer: Medicare PPO | Admitting: Family Medicine

## 2017-03-09 VITALS — BP 120/60 | HR 61 | Temp 97.8°F | Resp 16 | Ht 63.0 in | Wt 149.0 lb

## 2017-03-09 DIAGNOSIS — E782 Mixed hyperlipidemia: Secondary | ICD-10-CM

## 2017-03-09 DIAGNOSIS — F3289 Other specified depressive episodes: Secondary | ICD-10-CM

## 2017-03-09 DIAGNOSIS — E032 Hypothyroidism due to medicaments and other exogenous substances: Secondary | ICD-10-CM

## 2017-03-09 DIAGNOSIS — I4891 Unspecified atrial fibrillation: Secondary | ICD-10-CM

## 2017-03-09 DIAGNOSIS — I10 Essential (primary) hypertension: Secondary | ICD-10-CM

## 2017-03-09 DIAGNOSIS — R5382 Chronic fatigue, unspecified: Secondary | ICD-10-CM

## 2017-03-09 NOTE — Progress Notes (Signed)
Name: Stacey Duncan   MRN: [id number]    DOB: 22-Oct-1931   Date:03/09/2017       Progress Note  Subjective  Chief Complaint  Chief Complaint  Patient presents with  . Hypertension  . Depression  . Hyperlipidemia    HPI Here for f/u of HBP, hypothyroidism, hyperlipidemia, and depression.  BPs at home consistently in normal range with higher dose of Amlodipine.  Depression doing well at present.  No problem-specific Assessment & Plan notes found for this encounter.   Past Medical History:  Diagnosis Date  . Fibromyalgia   . GERD (gastroesophageal reflux disease)   . HTN (hypertension)   . Hypothyroidism   . Osteoarthritis    a. bilateral knees  . PAF (paroxysmal atrial fibrillation) (HCC)    a. on Eliquis;    Past Surgical History:  Procedure Laterality Date  . ABDOMINAL HYSTERECTOMY    . CHOLECYSTECTOMY    . ROTATOR CUFF REPAIR     left     Family History  Problem Relation Age of Onset  . Heart disease Mother 38    CABG  . Heart attack Maternal Grandfather   . Heart attack Maternal Grandmother     Social History   Social History  . Marital status: Married    Spouse name: N/A  . Number of children: N/A  . Years of education: N/A   Occupational History  . Not on file.   Social History Main Topics  . Smoking status: Never Smoker  . Smokeless tobacco: Never Used  . Alcohol use 3.0 oz/week    5 Glasses of wine per week     Comment: glass of wine daily  . Drug use: No  . Sexual activity: Not on file   Other Topics Concern  . Not on file   Social History Narrative  . No narrative on file     Current Outpatient Prescriptions:  .  amLODipine (NORVASC) 5 MG tablet, Take 1 tablet (5 mg total) by mouth daily., Disp: 30 tablet, Rfl: 6 .  Cholecalciferol (VITAMIN D-1000 MAX ST) 1000 UNITS tablet, Take 1,000 Units by mouth daily. , Disp: , Rfl:  .  ELIQUIS 5 MG TABS tablet, TAKE ONE (1) TABLET BY MOUTH TWO (2) TIMES DAILY, Disp: 180 tablet, Rfl: 3 .   FLUoxetine (PROZAC) 40 MG capsule, TAKE 1 CAPSULE EVERY DAY, Disp: 90 capsule, Rfl: 3 .  levothyroxine (SYNTHROID, LEVOTHROID) 100 MCG tablet, TAKE 1 TABLET EVERY DAY, Disp: 90 tablet, Rfl: 3 .  losartan (COZAAR) 100 MG tablet, Take 1 tablet (100 mg total) by mouth daily., Disp: 30 tablet, Rfl: 0 .  metoprolol tartrate (LOPRESSOR) 25 MG tablet, TAKE 1 TABLET TWICE DAILY (Patient taking differently: TAKES 1/2 TABLET TWICE A DAY), Disp: 180 tablet, Rfl: 3 .  omeprazole (PRILOSEC) 40 MG capsule, TAKE 1 CAPSULE EVERY DAY, Disp: 90 capsule, Rfl: 3 .  phenazopyridine (PYRIDIUM) 100 MG tablet, Take 1 tablet (100 mg total) by mouth 3 (three) times daily as needed for pain., Disp: 15 tablet, Rfl: 0 .  simvastatin (ZOCOR) 40 MG tablet, Take 1 tablet every other day as directed., Disp: 30 tablet, Rfl: 12  Allergies  Allergen Reactions  . Sulfa Antibiotics Other (See Comments)  . Sulfacetamide Sodium     Other reaction(s): Other (See Comments)     Review of Systems  Constitutional: Positive for malaise/fatigue. Negative for chills, fever and weight loss.  HENT: Negative for hearing loss and tinnitus.   Eyes: Negative for blurred  vision and double vision.  Respiratory: Negative for cough, hemoptysis and wheezing.   Cardiovascular: Negative for chest pain, palpitations and leg swelling.  Gastrointestinal: Negative for abdominal pain, blood in stool and heartburn.  Genitourinary: Negative for dysuria, frequency and urgency.  Musculoskeletal: Positive for joint pain (diffuse, mild). Negative for myalgias.  Skin: Negative for rash.  Neurological: Negative for dizziness, tingling, tremors, weakness and headaches.      Objective  Vitals:   03/09/17 1444 03/09/17 1602  BP: (!) 124/53 120/60  Pulse: 61   Resp: 16   Temp: 97.8 F (36.6 C)   TempSrc: Oral   Weight: 149 lb (67.6 kg)   Height: 5' 3"  (1.6 m)     Physical Exam  Constitutional: She is oriented to person, place, and time and  well-developed, well-nourished, and in no distress. No distress.  HENT:  Head: Normocephalic and atraumatic.  Eyes: Conjunctivae and EOM are normal. Pupils are equal, round, and reactive to light. No scleral icterus.  Neck: Normal range of motion. Neck supple. No thyromegaly present.  Cardiovascular: Normal rate and regular rhythm.  Exam reveals no gallop and no friction rub.   No murmur heard. Pulmonary/Chest: Effort normal and breath sounds normal. No respiratory distress. She has no wheezes. She has no rales.  Abdominal: Soft. Bowel sounds are normal. She exhibits no distension and no mass. There is no tenderness.  Musculoskeletal: She exhibits no edema.  Lymphadenopathy:    She has no cervical adenopathy.  Neurological: She is alert and oriented to person, place, and time.  Vitals reviewed.      Recent Results (from the past 2160 hour(s))  COMPLETE METABOLIC PANEL WITH GFR     Status: Abnormal   Collection Time: 02/03/17 12:00 AM  Result Value Ref Range   Sodium 139 135 - 146 mmol/L   Potassium 4.4 3.5 - 5.3 mmol/L   Chloride 102 98 - 110 mmol/L   CO2 29 20 - 31 mmol/L   Glucose, Bld 104 (H) 65 - 99 mg/dL   BUN 11 7 - 25 mg/dL   Creat 0.90 (H) 0.60 - 0.88 mg/dL    Comment:   For patients > or = 81 years of age: The upper reference limit for Creatinine is approximately 13% higher for people identified as African-American.      Total Bilirubin 0.6 0.2 - 1.2 mg/dL   Alkaline Phosphatase 65 33 - 130 U/L   AST 21 10 - 35 U/L   ALT 14 6 - 29 U/L   Total Protein 6.8 6.1 - 8.1 g/dL   Albumin 4.1 3.6 - 5.1 g/dL   Calcium 9.0 8.6 - 10.4 mg/dL   GFR, Est African American 68 >=60 mL/min   GFR, Est Non African American 59 (L) >=60 mL/min  CBC with Differential     Status: None   Collection Time: 02/03/17 12:00 AM  Result Value Ref Range   WBC 7.1 3.8 - 10.8 K/uL   RBC 4.43 3.80 - 5.10 MIL/uL   Hemoglobin 13.4 11.7 - 15.5 g/dL   HCT 40.9 35.0 - 45.0 %   MCV 92.3 80.0 - 100.0  fL   MCH 30.2 27.0 - 33.0 pg   MCHC 32.8 32.0 - 36.0 g/dL   RDW 14.2 11.0 - 15.0 %   Platelets 304 140 - 400 K/uL   MPV 9.6 7.5 - 12.5 fL   Neutro Abs 3,550 1,500 - 7,800 cells/uL   Lymphs Abs 2,769 850 - 3,900 cells/uL   Monocytes  Absolute 568 200 - 950 cells/uL   Eosinophils Absolute 213 15 - 500 cells/uL   Basophils Absolute 0 0 - 200 cells/uL   Neutrophils Relative % 50 %   Lymphocytes Relative 39 %   Monocytes Relative 8 %   Eosinophils Relative 3 %   Basophils Relative 0 %   Smear Review Criteria for review not met   Lipid Profile     Status: Abnormal   Collection Time: 02/03/17 12:00 AM  Result Value Ref Range   Cholesterol 200 (H) <200 mg/dL   Triglycerides 142 <150 mg/dL   HDL 49 (L) >50 mg/dL   Total CHOL/HDL Ratio 4.1 <5.0 Ratio   VLDL 28 <30 mg/dL   LDL Cholesterol 123 (H) <100 mg/dL  TSH     Status: None   Collection Time: 02/03/17 12:00 AM  Result Value Ref Range   TSH 1.56 mIU/L    Comment:   Reference Range   > or = 20 Years  0.40-4.50   Pregnancy Range First trimester  0.26-2.66 Second trimester 0.55-2.73 Third trimester  0.43-2.91        Assessment & Plan  Problem List Items Addressed This Visit      Cardiovascular and Mediastinum   Atrial fibrillation (HCC)   HBP (high blood pressure) - Primary     Endocrine   Thyroid activity decreased     Other   Chronic fatigue   Depression   Hyperlipidemia      No orders of the defined types were placed in this encounter.  1. Essential hypertension  Cont Amlodipine,  Metoprolol and Losartan 2. Atrial fibrillation, unspecified type ([place])   3. Hypothyroidism due to non-medication exogenous substances  Cont Levothyroxine 4. Other depression Cont Prozac  5. Chronic fatigue   6. Mixed hyperlipidemia Cont Simvastatin.   Recheck lipid panel in 4 months

## 2017-03-29 ENCOUNTER — Other Ambulatory Visit: Payer: Self-pay | Admitting: Cardiovascular Disease

## 2017-03-29 MED ORDER — APIXABAN 5 MG PO TABS: ORAL_TABLET | ORAL | 2 refills | Status: DC

## 2017-04-28 ENCOUNTER — Other Ambulatory Visit: Payer: Self-pay | Admitting: Family Medicine

## 2017-04-28 DIAGNOSIS — K219 Gastro-esophageal reflux disease without esophagitis: Secondary | ICD-10-CM

## 2017-04-28 MED ORDER — OMEPRAZOLE 40 MG PO CPDR: 40.0000 mg | DELAYED_RELEASE_CAPSULE | Freq: Every day | ORAL | 3 refills | Status: DC

## 2017-04-29 ENCOUNTER — Telehealth: Payer: Self-pay | Admitting: Family Medicine

## 2017-04-29 DIAGNOSIS — K219 Gastro-esophageal reflux disease without esophagitis: Secondary | ICD-10-CM

## 2017-04-29 MED ORDER — OMEPRAZOLE 40 MG PO CPDR: 40.0000 mg | DELAYED_RELEASE_CAPSULE | Freq: Every day | ORAL | 3 refills | Status: DC

## 2017-04-29 MED ORDER — FLUOXETINE HCL 40 MG PO CAPS: 40.0000 mg | ORAL_CAPSULE | Freq: Every day | ORAL | 3 refills | Status: DC

## 2017-04-29 NOTE — Telephone Encounter (Signed)
Refilled Fluoxetine 40mg  daily and Omeprazole 40mg  daily #90 +3 refills sent to [facility]  Saralyn PilarAlexander Karamalegos, DO [facility] 04/29/2017, 12:08 PM

## 2017-04-29 NOTE — Telephone Encounter (Signed)
Pt needs a new prescription for prozac and omeprazole sent to [facility].  Her call back number is [phone]

## 2017-05-21 ENCOUNTER — Other Ambulatory Visit: Payer: Self-pay | Admitting: Cardiovascular Disease

## 2017-05-21 ENCOUNTER — Other Ambulatory Visit: Payer: Self-pay

## 2017-05-21 DIAGNOSIS — E039 Hypothyroidism, unspecified: Secondary | ICD-10-CM

## 2017-05-21 MED ORDER — LEVOTHYROXINE SODIUM 100 MCG PO TABS: 100.0000 ug | ORAL_TABLET | Freq: Every day | ORAL | 3 refills | Status: DC

## 2017-05-24 ENCOUNTER — Telehealth: Payer: Self-pay | Admitting: Family Medicine

## 2017-05-24 NOTE — Telephone Encounter (Signed)
Called by nurse answering service after hours today 05/24/17 at approx 6:45pm, patient reports symptoms of worsening GERD, heartburn, belching last night after dinner lasted from 7pm to 1am, refractory to chronic Omeprazole 40mg  daily. She had some improvement today still belching and felt burning. Did also admit to some dyspnea when questioned by nurse triage. Vitals yesterday as low as 90/60 but this improved later on re-check. She called the line to ask if any other medication other than omeprazole. She may have taken Mylanta without relief. She has history of HLD, HTN, Atrial Fibrillation, followed by cardiology, she is on anticoagulation with Eliquis.  I advised nurse triage that given patient now without any active chest pain or pressure, dyspnea, and her home monitored vitals are stable (normal heart rate). She may take a 2nd omeprazole 40mg  tonight, and take any other antacid symptomatic medicine (Tums, Mylanta, Pepto) PRN tonight. She may schedule with us in office within 24-48 hours if symptoms persist. Otherwise, given her history (without actual known CAD, MI) if any significant worsening chest pain, pressure, dyspnea or new symptoms, she should seek more immediate medical attention tonight or tomorrow going to hospital ED for further evaluation.  Saralyn PilarAlexander Karamalegos, DO [facility] 05/24/2017, 6:57 PM

## 2017-05-25 ENCOUNTER — Ambulatory Visit (INDEPENDENT_AMBULATORY_CARE_PROVIDER_SITE_OTHER): Payer: Medicare PPO | Admitting: Family Medicine

## 2017-05-25 ENCOUNTER — Encounter: Payer: Self-pay | Admitting: Family Medicine

## 2017-05-25 VITALS — BP 120/53 | HR 56 | Temp 98.4°F | Resp 16 | Ht 63.0 in | Wt 149.0 lb

## 2017-05-25 DIAGNOSIS — K219 Gastro-esophageal reflux disease without esophagitis: Secondary | ICD-10-CM

## 2017-05-25 MED ORDER — SUCRALFATE 1 G PO TABS: 1.0000 g | ORAL_TABLET | Freq: Three times a day (TID) | ORAL | 1 refills | Status: DC

## 2017-05-25 NOTE — Patient Instructions (Addendum)
Thank you for coming to the clinic today.  1.  Start taking double dose Omeprazole 40mg  TWICE daily - one capsule first thing in morning 30 min before breakfast, and then 2nd dose 30 min before dinner. - Try this for 2 to 4 weeks, then after 4 weeks we can taper dose down, every OTHER day, take TWICE a day, then next day once, may try this 2 to 4 weeks, then back to once daily  Take Sucralfate / Carafate as needed for heartburn symptoms - will coat your stomach and esophagus lining, take with food and bedtime if helps.  If you are not improving, or worsening, develop more nausea, vomiting, regurgitation, abdominal pain, unintentional weight loss, dark stools or blood test.  Then next step is referral to GI - for possible upper scope and other assistance  - Avoid spicy, greasy, fried foods, also things like caffeine, dark chocolate, peppermint can worsen - Avoid large meals and late night snacks, also do not go more than 4-5 hours without a snack or meal (not eating will worsen reflux symptoms due to stomach acid)  If symptoms are worsening, persistent symptoms despite treatment or develop esophageal or abdominal pain, unable to swallow solids or liquids, nausea, vomiting, fever/chills, or unintentional weight loss / no appetite, please follow-up sooner or seek more immediate medical attention.  Medication dosing changes - First thing in AM: Take Levothyroxine, Eliquis, Metoprolol, Omeprazole - 9am: Take Amlodipine, Losartan - Lunch: Prozac - PM: Eliquis, Metoprolol, Simvastatin  Try topical OTC Hydrocortisone twice daily for 2 weeks  If blood pressures continues to be low < 90/70, and feel lightheaded, dizzy or sleepy, can try cutting Losartan 100mg  in HALF, then next step in future consider STOP Amlodipine  Follow-up as scheduled 2 months  If you have any other questions or concerns, please feel free to call the clinic or send a message through MyChart. You may also schedule an earlier  appointment if necessary.  Saralyn PilarAlexander Sherill Wegener, DO [facility]

## 2017-05-25 NOTE — Progress Notes (Signed)
Subjective:    Patient ID: [name], female    DOB: 1931/09/07, 83 y.o.   MRN: [id number]  Stacey Duncan is a 83 y.o. female presenting on 05/25/2017 for Gastroesophageal Reflux (getting worst onset 2 weeks ago and burping much and day befor eyesterday was worst)  Patient presents for a same day appointment.  HPI   GERD: - Patient presents for acute follow-up today within 24 hours of calling the on-call nurse afterhours line yesterday on 05/24/17 with complaint of refractory GERD and belching. She did admit to some possible dyspnea without chest pain or pressure, and they were prompted to consider possible cardiac etiology and called me. See telephone note for discussion, she was not advised to go to ED last night, and instead take extra omeprazole and symptom control follow-up today. Additionally she has a trip out of state, leaving for [place] tomorrow, grandson graduating high school, and has new great [place] age  - Reports symptoms started 2 weeks ago suddenly woke up in AM with severe burping and burning and heartburn, without nausea, vomiting or regurgitation. - Has chronic history of GERD intermittent and chronic burping, not always triggered by certain foods, taking Omeprazole 77m daily for many years, no recent changes in past. Never seen GI specialist in past. No prior EGD. She has been told esophageal spasms in past, but this is different than current heartburn. Improved with yogurt and food choices. Thinks symptoms worse after taking all of her medicines in the morning, 9am,  - Never had CAD, MI,  - Not taking NSAIDs. Not taking Tylenol - Admits globus sensation, coughing and throat clearing - Denies hematemesis, dark stool blood in stool, abdominal pain, nausea, vomiting, unintentional weight loss, night sweats   Social History  Substance Use Topics  . Smoking status: Never Smoker  . Smokeless tobacco: Never Used  . Alcohol use 3.0 oz/week    5 Glasses of wine  per week     Comment: glass of wine daily    Review of Systems Per HPI unless specifically indicated above     Objective:    BP (!) 120/53   Pulse (!) 56   Temp 98.4 F (36.9 C) (Oral)   Resp 16   Ht 5' 3"  (1.6 m)   Wt 149 lb (67.6 kg)   BMI 26.39 kg/m   Wt Readings from Last 3 Encounters:  05/25/17 149 lb (67.6 kg)  03/09/17 149 lb (67.6 kg)  02/01/17 150 lb (68 kg)    Physical Exam  Constitutional: She is oriented to person, place, and time. She appears well-developed and well-nourished. No distress.  Well-appearing, comfortable, cooperative  HENT:  Head: Normocephalic and atraumatic.  Mouth/Throat: Oropharynx is clear and moist.  Eyes: Conjunctivae are normal.  Neck: Normal range of motion. Neck supple.  Cardiovascular: Normal rate, regular rhythm, normal heart sounds and intact distal pulses.   No murmur heard. Pulmonary/Chest: Effort normal and breath sounds normal. No respiratory distress. She has no wheezes. She has no rales.  Abdominal: Soft. Bowel sounds are normal. She exhibits no distension and no mass. There is no tenderness. There is no rebound.  Neurological: She is alert and oriented to person, place, and time.  Skin: Skin is warm and dry. No rash noted. She is not diaphoretic. No erythema.  Psychiatric: She has a normal mood and affect. Her behavior is normal.  Nursing note and vitals reviewed.  Results for orders placed or performed in visit on 02/01/17  COMPLETE METABOLIC  PANEL WITH GFR  Result Value Ref Range   Sodium 139 135 - 146 mmol/L   Potassium 4.4 3.5 - 5.3 mmol/L   Chloride 102 98 - 110 mmol/L   CO2 29 20 - 31 mmol/L   Glucose, Bld 104 (H) 65 - 99 mg/dL   BUN 11 7 - 25 mg/dL   Creat 0.90 (H) 0.60 - 0.88 mg/dL   Total Bilirubin 0.6 0.2 - 1.2 mg/dL   Alkaline Phosphatase 65 33 - 130 U/L   AST 21 10 - 35 U/L   ALT 14 6 - 29 U/L   Total Protein 6.8 6.1 - 8.1 g/dL   Albumin 4.1 3.6 - 5.1 g/dL   Calcium 9.0 8.6 - 10.4 mg/dL   GFR, Est  African American 68 >=60 mL/min   GFR, Est Non African American 59 (L) >=60 mL/min  CBC with Differential  Result Value Ref Range   WBC 7.1 3.8 - 10.8 K/uL   RBC 4.43 3.80 - 5.10 MIL/uL   Hemoglobin 13.4 11.7 - 15.5 g/dL   HCT 40.9 35.0 - 45.0 %   MCV 92.3 80.0 - 100.0 fL   MCH 30.2 27.0 - 33.0 pg   MCHC 32.8 32.0 - 36.0 g/dL   RDW 14.2 11.0 - 15.0 %   Platelets 304 140 - 400 K/uL   MPV 9.6 7.5 - 12.5 fL   Neutro Abs 3,550 1,500 - 7,800 cells/uL   Lymphs Abs 2,769 850 - 3,900 cells/uL   Monocytes Absolute 568 200 - 950 cells/uL   Eosinophils Absolute 213 15 - 500 cells/uL   Basophils Absolute 0 0 - 200 cells/uL   Neutrophils Relative % 50 %   Lymphocytes Relative 39 %   Monocytes Relative 8 %   Eosinophils Relative 3 %   Basophils Relative 0 %   Smear Review Criteria for review not met   Lipid Profile  Result Value Ref Range   Cholesterol 200 (H) <200 mg/dL   Triglycerides 142 <150 mg/dL   HDL 49 (L) >50 mg/dL   Total CHOL/HDL Ratio 4.1 <5.0 Ratio   VLDL 28 <30 mg/dL   LDL Cholesterol 123 (H) <100 mg/dL  TSH  Result Value Ref Range   TSH 1.56 mIU/L      Assessment & Plan:   Problem List Items Addressed This Visit    GERD (gastroesophageal reflux disease) - Primary    Suspected acute on chronic flare with GERD without clear triggers, no prior EGD or other diagnostics in past. On chronic PPI, has had some refractory belching for while. - No GI red flag symptoms. Exam is unremarkable with benign abdomen without pain today - History not suggestive of PUD (no regular NSAID use, pain not improved with eating, not long term uncontrolled GERD) - Reassurance today clinically less likely cardiac in nature without atypical features  Plan: 1. Since already on higher dose Omeprazole 12m daily - advised to increase to 447mBID for trial 2-4 weeks, then advised will need to taper off due to hypersecretory rebound effect of PPI, can do 40 BID every other day and 40 daily other days  for 2-4 weeks, then back to 40 daily eventually can consider gradual lower further, or switch to alternative PPI such as protonix 2. Start rx Carafate for PRN symptom control 3. Diet modifications reduce GERD 4. Follow-up 1-2 months as scheduled - follow-up GERD and consider future GI referral for refractory symptoms and may need EGD given chronic history  Relevant Medications   sucralfate (CARAFATE) 1 g tablet      Meds ordered this encounter  Medications  . sucralfate (CARAFATE) 1 g tablet    Sig: Take 1 tablet (1 g total) by mouth 4 (four) times daily -  with meals and at bedtime.    Dispense:  30 tablet    Refill:  1    Follow up plan: Return in about 2 months (around 07/25/2017).  Nobie Putnam, [facility] 05/26/2017, 6:27 AM

## 2017-05-26 NOTE — Assessment & Plan Note (Signed)
Suspected acute on chronic flare with GERD without clear triggers, no prior EGD or other diagnostics in past. On chronic PPI, has had some refractory belching for while. - No GI red flag symptoms. Exam is unremarkable with benign abdomen without pain today - History not suggestive of PUD (no regular NSAID use, pain not improved with eating, not long term uncontrolled GERD) - Reassurance today clinically less likely cardiac in nature without atypical features  Plan: 1. Since already on higher dose Omeprazole 40mg  daily - advised to increase to 40mg  BID for trial 2-4 weeks, then advised will need to taper off due to hypersecretory rebound effect of PPI, can do 40 BID every other day and 40 daily other days for 2-4 weeks, then back to 40 daily eventually can consider gradual lower further, or switch to alternative PPI such as protonix 2. Start rx Carafate for PRN symptom control 3. Diet modifications reduce GERD 4. Follow-up 1-2 months as scheduled - follow-up GERD and consider future GI referral for refractory symptoms and may need EGD given chronic history

## 2017-06-01 ENCOUNTER — Telehealth: Payer: Self-pay | Admitting: Family Medicine

## 2017-06-01 NOTE — Telephone Encounter (Signed)
Called pt to schedule Annual Wellness Visit with Nurse Health Advisor for July:  - knb ° °

## 2017-06-25 ENCOUNTER — Telehealth: Payer: Self-pay

## 2017-06-25 DIAGNOSIS — K219 Gastro-esophageal reflux disease without esophagitis: Secondary | ICD-10-CM

## 2017-06-25 NOTE — Telephone Encounter (Signed)
Called patient back, last saw her 1 month ago for same issue, she was increased on Omeprazole 40mg  to BID instead of daily, also given other therapy and counseling. See note for details. Now she states overall some days are better and symptom free but still has episodes 5-6 in 1 month with worsening GERD symptoms heartburn, pain, burping, among other symptoms, and had recent bad flare with episode of sweating and felt like prior AFib, but her vitals at home were normal including heart rate. I advised since not resolved on BID PPI I have limited other options, I am concerned she may need further imaging or work-up maybe EGD or swallow study and second opinion from GI, referral to [facility] placed, she will contact them within 2 weeks if not heard back. Also advised her to follow-up as planned with Dr Mariah MillingGollan, already scheduled in 08/2017 for routine follow-up Chronic AFib, she knows when to seek more immediate help for heart/AFib and when to call Cardiology for sooner apt if needed. Questions answered.  Saralyn PilarAlexander Karamalegos, DO [facility] 06/25/2017, 12:29 PM

## 2017-06-25 NOTE — Telephone Encounter (Signed)
Stacey Duncan Bibleat called to give you an update as she was told too.  She reports that she is still have scid reflux without much change.  She had 2 episodes that lasted all day.  Patient wanted to update you and can call her back at your  Convenience.

## 2017-07-20 ENCOUNTER — Ambulatory Visit (INDEPENDENT_AMBULATORY_CARE_PROVIDER_SITE_OTHER): Payer: Medicare PPO

## 2017-07-20 ENCOUNTER — Ambulatory Visit: Payer: Medicare PPO

## 2017-07-20 VITALS — BP 124/60 | HR 62 | Temp 98.6°F | Resp 16 | Ht 61.0 in | Wt 148.4 lb

## 2017-07-20 DIAGNOSIS — Z0000 Encounter for general adult medical examination without abnormal findings: Secondary | ICD-10-CM

## 2017-07-20 NOTE — Patient Instructions (Addendum)
Stacey Duncan , Thank you for taking time to come for your Medicare Wellness Visit. I appreciate your ongoing commitment to your health goals. Please review the following plan we discussed and let me know if I can assist you in the future.   Screening recommendations/referrals: Colonoscopy: completed 12/28/2002, no longer required Mammogram: completed 08/29/2014, no longer required Bone Density: completed 09/28/1999 Recommended yearly ophthalmology/optometry visit for glaucoma screening and checkup Recommended yearly dental visit for hygiene and checkup  Vaccinations: Influenza vaccine: up to date, due 09/2017 Pneumococcal vaccine: up to date Tdap vaccine: up to date Shingles vaccine: due, check with your insurance company for coverage  Advanced directives: Advance directive discussed with you today. I have provided a copy for you to complete at home and have notarized. Once this is complete please bring a copy in to our office so we can scan it into your chart.  Conditions/risks identified: Recommend continue drinking at least 5-6 glasses of water a day   Next appointment: Follow up with Dr.Karamalegos on 07/21/2017 at 1:20pm. Follow up in one year for your annual wellness exam.   Preventive Care 65 Years and Older, Female Preventive care refers to lifestyle choices and visits with your health care provider that can promote health and wellness. What does preventive care include?  A yearly physical exam. This is also called an annual well check.  Dental exams once or twice a year.  Routine eye exams. Ask your health care provider how often you should have your eyes checked.  Personal lifestyle choices, including:  Daily care of your teeth and gums.  Regular physical activity.  Eating a healthy diet.  Avoiding tobacco and drug use.  Limiting alcohol use.  Practicing safe sex.  Taking low-dose aspirin every day.  Taking vitamin and mineral supplements as recommended by your  health care provider. What happens during an annual well check? The services and screenings done by your health care provider during your annual well check will depend on your age, overall health, lifestyle risk factors, and family history of disease. Counseling  Your health care provider may ask you questions about your:  Alcohol use.  Tobacco use.  Drug use.  Emotional well-being.  Home and relationship well-being.  Sexual activity.  Eating habits.  History of falls.  Memory and ability to understand (cognition).  Work and work Astronomerenvironment.  Reproductive health. Screening  You may have the following tests or measurements:  Height, weight, and BMI.  Blood pressure.  Lipid and cholesterol levels. These may be checked every 5 years, or more frequently if you are over 534 years old.  Skin check.  Lung cancer screening. You may have this screening every year starting at age 755 if you have a 30-pack-year history of smoking and currently smoke or have quit within the past 15 years.  Fecal occult blood test (FOBT) of the stool. You may have this test every year starting at age 81.  Flexible sigmoidoscopy or colonoscopy. You may have a sigmoidoscopy every 5 years or a colonoscopy every 10 years starting at age 81.  Hepatitis C blood test.  Hepatitis B blood test.  Sexually transmitted disease (STD) testing.  Diabetes screening. This is done by checking your blood sugar (glucose) after you have not eaten for a while (fasting). You may have this done every 1-3 years.  Bone density scan. This is done to screen for osteoporosis. You may have this done starting at age 81.  Mammogram. This may be done every 1-2 years.  Talk to your health care provider about how often you should have regular mammograms. Talk with your health care provider about your test results, treatment options, and if necessary, the need for more tests. Vaccines  Your health care provider may recommend  certain vaccines, such as:  Influenza vaccine. This is recommended every year.  Tetanus, diphtheria, and acellular pertussis (Tdap, Td) vaccine. You may need a Td booster every 10 years.  Zoster vaccine. You may need this after age 54.  Pneumococcal 13-valent conjugate (PCV13) vaccine. One dose is recommended after age 56.  Pneumococcal polysaccharide (PPSV23) vaccine. One dose is recommended after age 27. Talk to your health care provider about which screenings and vaccines you need and how often you need them. This information is not intended to replace advice given to you by your health care provider. Make sure you discuss any questions you have with your health care provider. Document Released: 01/10/2016 Document Revised: 09/02/2016 Document Reviewed: 10/15/2015 Elsevier Interactive Patient Education  2017 [place] Prevention in the Home Falls can cause injuries. They can happen to people of all ages. There are many things you can do to make your home safe and to help prevent falls. What can I do on the outside of my home?  Regularly fix the edges of walkways and driveways and fix any cracks.  Remove anything that might make you trip as you walk through a door, such as a raised step or threshold.  Trim any bushes or trees on the path to your home.  Use bright outdoor lighting.  Clear any walking paths of anything that might make someone trip, such as rocks or tools.  Regularly check to see if handrails are loose or broken. Make sure that both sides of any steps have handrails.  Any raised decks and porches should have guardrails on the edges.  Have any leaves, snow, or ice cleared regularly.  Use sand or salt on walking paths during winter.  Clean up any spills in your garage right away. This includes oil or grease spills. What can I do in the bathroom?  Use night lights.  Install grab bars by the toilet and in the tub and shower. Do not use towel bars as  grab bars.  Use non-skid mats or decals in the tub or shower.  If you need to sit down in the shower, use a plastic, non-slip stool.  Keep the floor dry. Clean up any water that spills on the floor as soon as it happens.  Remove soap buildup in the tub or shower regularly.  Attach bath mats securely with double-sided non-slip rug tape.  Do not have throw rugs and other things on the floor that can make you trip. What can I do in the bedroom?  Use night lights.  Make sure that you have a light by your bed that is easy to reach.  Do not use any sheets or blankets that are too big for your bed. They should not hang down onto the floor.  Have a firm chair that has side arms. You can use this for support while you get dressed.  Do not have throw rugs and other things on the floor that can make you trip. What can I do in the kitchen?  Clean up any spills right away.  Avoid walking on wet floors.  Keep items that you use a lot in easy-to-reach places.  If you need to reach something above you, use a strong step stool  that has a grab bar.  Keep electrical cords out of the way.  Do not use floor polish or wax that makes floors slippery. If you must use wax, use non-skid floor wax.  Do not have throw rugs and other things on the floor that can make you trip. What can I do with my stairs?  Do not leave any items on the stairs.  Make sure that there are handrails on both sides of the stairs and use them. Fix handrails that are broken or loose. Make sure that handrails are as long as the stairways.  Check any carpeting to make sure that it is firmly attached to the stairs. Fix any carpet that is loose or worn.  Avoid having throw rugs at the top or bottom of the stairs. If you do have throw rugs, attach them to the floor with carpet tape.  Make sure that you have a light switch at the top of the stairs and the bottom of the stairs. If you do not have them, ask someone to add them  for you. What else can I do to help prevent falls?  Wear shoes that:  Do not have high heels.  Have rubber bottoms.  Are comfortable and fit you well.  Are closed at the toe. Do not wear sandals.  If you use a stepladder:  Make sure that it is fully opened. Do not climb a closed stepladder.  Make sure that both sides of the stepladder are locked into place.  Ask someone to hold it for you, if possible.  Clearly mark and make sure that you can see:  Any grab bars or handrails.  First and last steps.  Where the edge of each step is.  Use tools that help you move around (mobility aids) if they are needed. These include:  Canes.  Walkers.  Scooters.  Crutches.  Turn on the lights when you go into a dark area. Replace any light bulbs as soon as they burn out.  Set up your furniture so you have a clear path. Avoid moving your furniture around.  If any of your floors are uneven, fix them.  If there are any pets around you, be aware of where they are.  Review your medicines with your doctor. Some medicines can make you feel dizzy. This can increase your chance of falling. Ask your doctor what other things that you can do to help prevent falls. This information is not intended to replace advice given to you by your health care provider. Make sure you discuss any questions you have with your health care provider. Document Released: 10/10/2009 Document Revised: 05/21/2016 Document Reviewed: 01/18/2015 Elsevier Interactive Patient Education  2017 [employer].

## 2017-07-20 NOTE — Progress Notes (Signed)
Subjective:   Stacey Duncan is a 83 y.o. female who presents for Medicare Annual (Subsequent) preventive examination.  Review of Systems:   Cardiac Risk Factors include: advanced age (>18men, >58 women);dyslipidemia;hypertension     Objective:     Vitals: BP 124/60 (BP Location: Left Arm, Patient Position: Sitting)   Pulse 62   Temp 98.6 F (37 C)   Resp 16   Ht 5\' 1"  (1.549 m)   Wt 148 lb 6.4 oz (67.3 kg)   BMI 28.04 kg/m   Body mass index is 28.04 kg/m.   Tobacco History  Smoking Status  . Never Smoker  Smokeless Tobacco  . Never Used     Counseling given: Not Answered   Past Medical History:  Diagnosis Date  . Fibromyalgia   . GERD (gastroesophageal reflux disease)   . HTN (hypertension)   . Hypothyroidism   . Osteoarthritis    a. bilateral knees  . PAF (paroxysmal atrial fibrillation) (HCC)    a. on Eliquis;   Past Surgical History:  Procedure Laterality Date  . ABDOMINAL HYSTERECTOMY    . CHOLECYSTECTOMY    . ROTATOR CUFF REPAIR     left    Family History  Problem Relation Age of Onset  . Heart disease Mother 86       CABG  . Heart attack Maternal Grandfather   . Heart attack Maternal Grandmother    History  Sexual Activity  . Sexual activity: Not on file    Outpatient Encounter Prescriptions as of 07/20/2017  Medication Sig  . amLODipine (NORVASC) 5 MG tablet Take 1 tablet (5 mg total) by mouth daily.  Marland Kitchen apixaban (ELIQUIS) 5 MG TABS tablet TAKE ONE (1) TABLET BY MOUTH TWO (2) TIMES DAILY  . Cholecalciferol (VITAMIN D-1000 MAX ST) 1000 UNITS tablet Take 1,000 Units by mouth daily.   Marland Kitchen FLUoxetine (PROZAC) 40 MG capsule Take 1 capsule (40 mg total) by mouth daily.  Marland Kitchen levothyroxine (SYNTHROID, LEVOTHROID) 100 MCG tablet Take 1 tablet (100 mcg total) by mouth daily.  Marland Kitchen losartan (COZAAR) 100 MG tablet Take 1 tablet (100 mg total) by mouth daily. (Patient taking differently: Take 100 mg by mouth daily. 1/2 tab once a day)  . metoprolol  tartrate (LOPRESSOR) 25 MG tablet TAKE 1 TABLET TWICE DAILY (Patient taking differently: TAKES 1/2 TABLET TWICE A DAY)  . omeprazole (PRILOSEC) 40 MG capsule Take 1 capsule (40 mg total) by mouth daily.  . simvastatin (ZOCOR) 40 MG tablet Take 1 tablet every other day as directed.  . sucralfate (CARAFATE) 1 g tablet Take 1 tablet (1 g total) by mouth 4 (four) times daily -  with meals and at bedtime.  . [DISCONTINUED] ELIQUIS 5 MG TABS tablet TAKE ONE TABLET BY MOUTH TWICE DAILY   No facility-administered encounter medications on file as of 07/20/2017.     Activities of Daily Living In your present state of health, do you have any difficulty performing the following activities: 07/20/2017 02/01/2017  Hearing? N N  Vision? N N  Difficulty concentrating or making decisions? N N  Walking or climbing stairs? Y N  Dressing or bathing? N N  Doing errands, shopping? N N  Preparing Food and eating ? N -  Using the Toilet? N -  In the past six months, have you accidently leaked urine? N -  Do you have problems with loss of bowel control? N -  Managing your Medications? N -  Managing your Finances? N -  Housekeeping  or managing your Housekeeping? N -  Some recent data might be hidden    Patient Care Team: Smitty CordsKaramalegos, Alexander J, DO as PCP - General (Family Medicine) Antonieta IbaGollan, Timothy J, MD as Consulting Physician (Cardiology) Riki AltesStoioff, Scott C, MD (Urology)    Assessment:     Exercise Activities and Dietary recommendations Current Exercise Habits: The patient does not participate in regular exercise at present, Exercise limited by: None identified  Goals    . Increase water intake          Recommend continue drinking at least 5-6 glasses of water a day       Fall Risk Fall Risk  07/20/2017 02/01/2017 10/22/2016 07/28/2016 02/06/2016  Falls in the past year? No No No No No   Depression Screen PHQ 2/9 Scores 07/20/2017 02/01/2017 10/22/2016 07/28/2016  PHQ - 2 Score 0 0 0 0     Cognitive  Function     6CIT Screen 07/20/2017  What Year? 0 points  What month? 0 points  What time? 0 points  Count back from 20 0 points  Months in reverse 0 points  Repeat phrase 0 points  Total Score 0    Immunization History  Administered Date(s) Administered  . Influenza, High Dose Seasonal PF 09/30/2015, 10/22/2016   Screening Tests Health Maintenance  Topic Date Due  . INFLUENZA VACCINE  07/28/2017  . TETANUS/TDAP  02/08/2025  . DEXA SCAN  Completed  . PNA vac Low Risk Adult  Completed      Plan:     I have personally reviewed and addressed the Medicare Annual Wellness questionnaire and have noted the following in the patient's chart:  A. Medical and social history B. Use of alcohol, tobacco or illicit drugs  C. Current medications and supplements D. Functional ability and status E.  Nutritional status F.  Physical activity G. Advance directives H. List of other physicians I.  Hospitalizations, surgeries, and ER visits in previous 12 months J.  Vitals K. Screenings such as hearing and vision if needed, cognitive and depression L. Referrals and appointments  In addition, I have reviewed and discussed with patient certain preventive protocols, quality metrics, and best practice recommendations. A written personalized care plan for preventive services as well as general preventive health recommendations were provided to patient.   Signed,  Marin Robertsiffany Hill, LPN Nurse Health Advisor   MD Recommendations: none

## 2017-07-21 ENCOUNTER — Encounter: Payer: Self-pay | Admitting: Family Medicine

## 2017-07-21 ENCOUNTER — Ambulatory Visit (INDEPENDENT_AMBULATORY_CARE_PROVIDER_SITE_OTHER): Payer: Medicare PPO | Admitting: Family Medicine

## 2017-07-21 VITALS — BP 100/53 | HR 59 | Temp 98.4°F | Resp 16 | Ht 63.0 in | Wt 147.0 lb

## 2017-07-21 DIAGNOSIS — I10 Essential (primary) hypertension: Secondary | ICD-10-CM | POA: Diagnosis not present

## 2017-07-21 DIAGNOSIS — K219 Gastro-esophageal reflux disease without esophagitis: Secondary | ICD-10-CM

## 2017-07-21 NOTE — Assessment & Plan Note (Signed)
Unchanged on max dose PPI 40mg  BID for 4 weeks Chronic problem without clear triggers, concern with refractory belching and intermittent heartburn. No significant red flag GI symptoms however.  Plan: 1. Already referred to [facility] Dr Servando SnareWohl initial apt in August 2018, no other changes at this time, may continue Omeprazole 40mg  daily now off BID, continue to modify diet, may need probiotic, suspect would benefit from further work-up with potential EGD

## 2017-07-21 NOTE — Assessment & Plan Note (Signed)
Well-controlled HTN with some lower readings. - Home BP readings occasional low reading 90-100/50s  No known complications except AFib   Plan:  1. HOLD Amlodipine 5mg  daily due to low BP 2. Continue Losartan 50mg  daily (Half of 100mg ) and Metoprolol 12.5mg  BID (half tab of 25mg  BID) 3. Encourage improved lifestyle - low sodium diet, regular exercise 4. Continue monitor BP outside office, bring readings to next visit, if persistently >140/90 or continued low BP < 90/50 new symptoms notify office sooner 5. Follow-up within 3 months, f/u with Cardiology review med change

## 2017-07-21 NOTE — Patient Instructions (Signed)
Thank you for coming to the clinic today.   1. As discussed, continue Omeprazole 40mg  daily for now - wait to discuss further with GI specialist on 8/22 - may need other procedural evaluation and possibly Endoscopy - No other changes at this time  2. Stop Amlodipine 5mg  daily for now - monitor BP closely - discuss with Dr Mariah MillingGollan, to review BP meds - Try to stay well hydrated   Please schedule a Follow-up Appointment to: Return in about 3 months (around 10/21/2017) for GERD, HTN.  If you have any other questions or concerns, please feel free to call the clinic or send a message through MyChart. You may also schedule an earlier appointment if necessary.  Additionally, you may be receiving a survey about your experience at our clinic within a few days to 1 week by e-mail or mail. We value your feedback.  Saralyn PilarAlexander Rashauna Tep, DO [facility]

## 2017-07-21 NOTE — Progress Notes (Signed)
Subjective:    Patient ID: Stacey Duncan, female    DOB: 1931/05/31, 83 y.o.   MRN: [id number]  Stacey Duncan is a 83 y.o. female presenting on 07/21/2017 for Gastroesophageal Reflux and Hypertension  HPI  CHRONIC HTN / History of AFib Reports she has noticed more low BPs occasionally at home. Has had variable history with periodic elevated BP in past few years. Current Meds - Amlodipine 21m daily, Losartan 544m(half of 10045maily), Metoprolol 1m81malf tab twice daily) Reports good compliance, took meds today. Tolerating well, w/o complaints. Lifestyle: - Diet: history of eating higher salt intake, tries to limit salty snack foods,  - Exercise: no regular exercise - Some anxiety and stress over missing her grandchildren Denies CP, dyspnea, HA, edema, dizziness / lightheadedness   FOLLOW-UP GERD: - Last visit with me 05/25/17, for initial visit for same problem with GERD and heartburn, treated with increased from Omeprazole 40mg60mly up to 40mg 77mfor 2-4 week trial, also given Carafate 1g PRN, see prior notes for background information. - Interval update with over past 2 months, she tried Omeprazole 40mg B88mith mixed results, no significant improvement, still has had unpredictable episodes of heartburn / burning with burping usually soon after eating or drinking water, also tried Carafate without improvement. Now has tapered back to Omeprazole 40mg da60mstill. She called us for [place] on 6/29, and GI referral placed to [facility] Dr Wohl, heAllen Norrisw patient apt is on 08/18/17 - Today patient reports still has same symptoms, taking Omeprazole 40mg dai4mno improvement, describes symptoms as unpredictable, can go days or weeks without or can have frequent recurrence. Describes her GI symptoms often as chronic for many years but have flares - Admits globus sensation, coughing and throat clearing - Denies hematemesis, dark stool blood in stool, abdominal pain, nausea, vomiting,  unintentional weight loss, night sweats  Health Maintenance: - Interested in Shingles vaccine, Shingrix, will get pharmacy, notify us if ne[place] rx  Social History  Substance Use Topics  . Smoking status: Never Smoker  . Smokeless tobacco: Never Used  . Alcohol use 3.0 oz/week    5 Glasses of wine per week     Comment: glass of wine daily    Review of Systems Per HPI unless specifically indicated above     Objective:    BP (!) 100/53   Pulse (!) 59   Temp 98.4 F (36.9 C) (Oral)   Resp 16   Ht 5' 3"  (1.6 m)   Wt 147 lb (66.7 kg)   BMI 26.04 kg/m   Wt Readings from Last 3 Encounters:  07/21/17 147 lb (66.7 kg)  07/20/17 148 lb 6.4 oz (67.3 kg)  05/25/17 149 lb (67.6 kg)    Physical Exam  Constitutional: She is oriented to person, place, and time. She appears well-developed and well-nourished. No distress.  Well-appearing, comfortable, cooperative  HENT:  Head: Normocephalic and atraumatic.  Mouth/Throat: Oropharynx is clear and moist.  Eyes: Conjunctivae are normal.  Neck: Normal range of motion. Neck supple.  Cardiovascular: Normal rate, regular rhythm, normal heart sounds and intact distal pulses.   No murmur heard. Pulmonary/Chest: Effort normal and breath sounds normal. No respiratory distress. She has no wheezes. She has no rales.  Abdominal: Soft. Bowel sounds are normal. She exhibits no distension and no mass. There is no tenderness. There is no rebound.  Neurological: She is alert and oriented to person, place, and time.  Skin: Skin is warm and dry.  No rash noted. She is not diaphoretic. No erythema.  Psychiatric: She has a normal mood and affect. Her behavior is normal.  Nursing note and vitals reviewed.  Results for orders placed or performed in visit on 02/01/17  COMPLETE METABOLIC PANEL WITH GFR  Result Value Ref Range   Sodium 139 135 - 146 mmol/L   Potassium 4.4 3.5 - 5.3 mmol/L   Chloride 102 98 - 110 mmol/L   CO2 29 20 - 31 mmol/L   Glucose, Bld  104 (H) 65 - 99 mg/dL   BUN 11 7 - 25 mg/dL   Creat 0.90 (H) 0.60 - 0.88 mg/dL   Total Bilirubin 0.6 0.2 - 1.2 mg/dL   Alkaline Phosphatase 65 33 - 130 U/L   AST 21 10 - 35 U/L   ALT 14 6 - 29 U/L   Total Protein 6.8 6.1 - 8.1 g/dL   Albumin 4.1 3.6 - 5.1 g/dL   Calcium 9.0 8.6 - 10.4 mg/dL   GFR, Est African American 68 >=60 mL/min   GFR, Est Non African American 59 (L) >=60 mL/min  CBC with Differential  Result Value Ref Range   WBC 7.1 3.8 - 10.8 K/uL   RBC 4.43 3.80 - 5.10 MIL/uL   Hemoglobin 13.4 11.7 - 15.5 g/dL   HCT 40.9 35.0 - 45.0 %   MCV 92.3 80.0 - 100.0 fL   MCH 30.2 27.0 - 33.0 pg   MCHC 32.8 32.0 - 36.0 g/dL   RDW 14.2 11.0 - 15.0 %   Platelets 304 140 - 400 K/uL   MPV 9.6 7.5 - 12.5 fL   Neutro Abs 3,550 1,500 - 7,800 cells/uL   Lymphs Abs 2,769 850 - 3,900 cells/uL   Monocytes Absolute 568 200 - 950 cells/uL   Eosinophils Absolute 213 15 - 500 cells/uL   Basophils Absolute 0 0 - 200 cells/uL   Neutrophils Relative % 50 %   Lymphocytes Relative 39 %   Monocytes Relative 8 %   Eosinophils Relative 3 %   Basophils Relative 0 %   Smear Review Criteria for review not met   Lipid Profile  Result Value Ref Range   Cholesterol 200 (H) <200 mg/dL   Triglycerides 142 <150 mg/dL   HDL 49 (L) >50 mg/dL   Total CHOL/HDL Ratio 4.1 <5.0 Ratio   VLDL 28 <30 mg/dL   LDL Cholesterol 123 (H) <100 mg/dL  TSH  Result Value Ref Range   TSH 1.56 mIU/L      Assessment & Plan:   Problem List Items Addressed This Visit    GERD (gastroesophageal reflux disease)    Unchanged on max dose PPI 38m BID for 4 weeks Chronic problem without clear triggers, concern with refractory belching and intermittent heartburn. No significant red flag GI symptoms however.  Plan: 1. Already referred [facility] Dr W[facility] Norrisinitial apt in August 2018, no other changes at this time, may continue Omeprazole 424mdaily now off BID, continue to modify diet, may need probiotic, suspect would benefit from  further work-up with potential EGD      Essential hypertension - Primary    Well-controlled HTN with some lower readings. - Home BP readings occasional low reading [id number] known complications except AFib   Plan:  1. HOLD Amlodipine 22m36maily due to low BP 2. Continue Losartan 99m64mily (Half of 100mg4md Metoprolol 12.22mg B73m(half tab of 222mg B40m3. Encourage improved lifestyle - low sodium diet, regular exercise 4. Continue  monitor BP outside office, bring readings to next visit, if persistently >140/90 or continued low BP < 90/50 new symptoms notify office sooner 5. Follow-up within 3 months, f/u with Cardiology review med change         No orders of the defined types were placed in this encounter.   Follow up plan: Return in about 3 months (around 10/21/2017) for GERD, HTN.  Nobie Putnam, [facility] 07/21/2017, 9:59 PM

## 2017-08-18 ENCOUNTER — Encounter: Payer: Self-pay | Admitting: Gastroenterology

## 2017-08-18 ENCOUNTER — Ambulatory Visit (INDEPENDENT_AMBULATORY_CARE_PROVIDER_SITE_OTHER): Payer: Medicare PPO | Admitting: Gastroenterology

## 2017-08-18 VITALS — BP 121/54 | HR 62 | Temp 98.5°F | Ht 63.0 in | Wt 149.0 lb

## 2017-08-18 DIAGNOSIS — K219 Gastro-esophageal reflux disease without esophagitis: Secondary | ICD-10-CM

## 2017-08-18 NOTE — Progress Notes (Signed)
Gastroenterology Consultation  Referring Provider:     Saralyn Pilar * Primary Care Physician:  Smitty Cords, DO Primary Gastroenterologist:  Dr. Servando Snare     Reason for Consultation:     GERD        HPI:   Stacey Duncan is a 83 y.o. y/o female referred for consultation & management of GERD by Dr. Althea Charon, Netta Neat, DO.  This patient comes in today with a history of long-standing GERD. The patient was started on a PPI with increase of the dose to twice a day. The patient continues to have symptoms. The patient reports that there is also a globus sensation associated with this. The patient says that the heartburn has been chronic for some time with intermittent flares of the reflux. There is no report of any unexplained weight loss fevers chills nausea or vomiting. The patient has also tried to modify her diet to decrease her heartburn. The patient also reports that she has a lot of burping. She denies that the burping wakes her up from a sleep but she reports that the burping has been bothersome. She does sit up at hot drinks but denies drinking carbonated drinks eating fast or chewing gum. She also reports that since making the appointment here she has not had any symptoms of heartburn. She states that sometimes she will eat peanut butter and have heartburn but other times she will have no heartburn with hot peppers.  Past Medical History:  Diagnosis Date  . Fibromyalgia   . GERD (gastroesophageal reflux disease)   . HTN (hypertension)   . Hypothyroidism   . Osteoarthritis    a. bilateral knees  . PAF (paroxysmal atrial fibrillation) (HCC)    a. on Eliquis;    Past Surgical History:  Procedure Laterality Date  . ABDOMINAL HYSTERECTOMY    . CHOLECYSTECTOMY    . ROTATOR CUFF REPAIR     left     Prior to Admission medications   Medication Sig Start Date End Date Taking? Authorizing Provider  amLODipine (NORVASC) 5 MG tablet Take 1 tablet (5 mg total) by  mouth daily. 02/01/17   Janeann Forehand., MD  apixaban (ELIQUIS) 5 MG TABS tablet TAKE ONE (1) TABLET BY MOUTH TWO (2) TIMES DAILY 03/29/17   Antonieta Iba, MD  Cholecalciferol (VITAMIN D-1000 MAX ST) 1000 UNITS tablet Take 1,000 Units by mouth daily.     [provider]  FLUoxetine (PROZAC) 40 MG capsule Take 1 capsule (40 mg total) by mouth daily. 04/29/17   Karamalegos, Netta Neat, DO  levothyroxine (SYNTHROID, LEVOTHROID) 100 MCG tablet Take 1 tablet (100 mcg total) by mouth daily. 05/21/17   Karamalegos, Netta Neat, DO  losartan (COZAAR) 100 MG tablet Take 1 tablet (100 mg total) by mouth daily. Patient taking differently: Take 100 mg by mouth daily. 1/2 tab once a day 12/03/16   Janeann Forehand., MD  metoprolol tartrate (LOPRESSOR) 25 MG tablet TAKE 1 TABLET TWICE DAILY Patient taking differently: TAKES 1/2 TABLET TWICE A DAY 07/31/16   Antonieta Iba, MD  omeprazole (PRILOSEC) 40 MG capsule Take 1 capsule (40 mg total) by mouth daily. 04/29/17   Karamalegos, Netta Neat, DO  simvastatin (ZOCOR) 40 MG tablet Take 1 tablet every other day as directed. 02/01/17   Janeann Forehand., MD  sucralfate (CARAFATE) 1 g tablet Take 1 tablet (1 g total) by mouth 4 (four) times daily -  with meals and at bedtime. 05/25/17  Smitty Cords, DO    Family History  Problem Relation Age of Onset  . Heart disease Mother 68       CABG  . Heart attack Maternal Grandfather   . Heart attack Maternal Grandmother      Social History  Substance Use Topics  . Smoking status: Never Smoker  . Smokeless tobacco: Never Used  . Alcohol use 3.0 oz/week    5 Glasses of wine per week     Comment: glass of wine daily    Allergies as of 08/18/2017 - Review Complete 07/21/2017  Allergen Reaction Noted  . Sulfa antibiotics Other (See Comments) 04/19/2015  . Sulfacetamide sodium  05/05/2016    Review of Systems:    All systems reviewed and negative except where noted in HPI.   Physical  Exam:  There were no vitals taken for this visit. No LMP recorded. Patient has had a hysterectomy. Psych:  Alert and cooperative. Normal mood and affect. General:   Alert,  Well-developed, well-nourished, pleasant and cooperative in NAD Head:  Normocephalic and atraumatic. Eyes:  Sclera clear, no icterus.   Conjunctiva pink. Ears:  Normal auditory acuity. Nose:  No deformity, discharge, or lesions. Mouth:  No deformity or lesions,oropharynx pink & moist. Neck:  Supple; no masses or thyromegaly. Lungs:  Respirations even and unlabored.  Clear throughout to auscultation.   No wheezes, crackles, or rhonchi. No acute distress. Heart:  Regular rate and rhythm; no murmurs, clicks, rubs, or gallops. Abdomen:  Normal bowel sounds.  No bruits.  Soft, non-tender and non-distended without masses, hepatosplenomegaly or hernias noted.  No guarding or rebound tenderness.  Negative Carnett sign.   Rectal:  Deferred.  Msk:  Symmetrical without gross deformities.  Good, equal movement & strength bilaterally. Pulses:  Normal pulses noted. Extremities:  No clubbing or edema.  No cyanosis. Neurologic:  Alert and oriented x3;  grossly normal neurologically. Skin:  Intact without significant lesions or rashes.  No jaundice. Lymph Nodes:  No significant cervical adenopathy. Psych:  Alert and cooperative. Normal mood and affect.  Imaging Studies: No results found.  Assessment and Plan:   Stacey Duncan is a 83 y.o. y/o female who comes in with chronic heartburn but states that she has had no symptoms for the last few weeks. The patient has no worrisome symptoms such as black stools or bloody stools, dysphagia, unexplained weight loss or nausea and vomiting. The patient is totally asymptomatic at the present time and taking her PPI. The patient has been told that if her symptoms come back she should call my office and she will be set up for an upper endoscopy but since she is a symptomatically the present time  she will not be set up for an upper endoscopy. The patient has been explained the pathophysiology of aerophagia and if she is burping up air she must be swallowing air. The patient will try to watch her there swallowing and see if she can curb it.  Midge Minium, MD. [facility]   Note: This dictation was prepared with Dragon dictation along with smaller phrase technology. Any transcriptional errors that result from this process are unintentional.
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
## 2017-09-14 ENCOUNTER — Encounter: Payer: Self-pay | Admitting: Cardiovascular Disease

## 2017-09-14 ENCOUNTER — Ambulatory Visit (INDEPENDENT_AMBULATORY_CARE_PROVIDER_SITE_OTHER): Payer: Medicare PPO | Admitting: Cardiovascular Disease

## 2017-09-14 VITALS — BP 128/68 | HR 52 | Ht 62.0 in | Wt 150.2 lb

## 2017-09-14 DIAGNOSIS — I480 Paroxysmal atrial fibrillation: Secondary | ICD-10-CM

## 2017-09-14 DIAGNOSIS — M797 Fibromyalgia: Secondary | ICD-10-CM | POA: Diagnosis not present

## 2017-09-14 DIAGNOSIS — R5382 Chronic fatigue, unspecified: Secondary | ICD-10-CM | POA: Diagnosis not present

## 2017-09-14 DIAGNOSIS — E782 Mixed hyperlipidemia: Secondary | ICD-10-CM | POA: Diagnosis not present

## 2017-09-14 NOTE — Patient Instructions (Signed)
Medication Instructions:   No medication changes made  If you have extreme fatigue, Cut the metoprolol down to 1/2 pill one a day  Labwork:  No new labs needed  Testing/Procedures:  No further testing at this time   Follow-Up: It was a pleasure seeing you in the office today. Please call us if you have new issues that need to be addressed before your next appt.  [phone]  Your physician wants you to follow-up in: 12 months.  You will receive a reminder letter in the mail two months in advance. If you don't receive a letter, please call our office to schedule the follow-up appointment.  If you need a refill on your cardiac medications before your next appointment, please call your pharmacy.

## 2017-10-15 ENCOUNTER — Emergency Department: Payer: Medicare PPO

## 2017-10-15 ENCOUNTER — Telehealth: Payer: Self-pay | Admitting: Cardiovascular Disease

## 2017-10-15 ENCOUNTER — Observation Stay
Admission: EM | Admit: 2017-10-15 | Discharge: 2017-10-15 | Disposition: A | Discharge status: Home / Self Care | Payer: Medicare PPO | Attending: Internal Medicine | Admitting: Internal Medicine

## 2017-10-15 DIAGNOSIS — Z79899 Other long term (current) drug therapy: Secondary | ICD-10-CM | POA: Insufficient documentation

## 2017-10-15 DIAGNOSIS — E782 Mixed hyperlipidemia: Secondary | ICD-10-CM | POA: Diagnosis not present

## 2017-10-15 DIAGNOSIS — E039 Hypothyroidism, unspecified: Secondary | ICD-10-CM | POA: Diagnosis not present

## 2017-10-15 DIAGNOSIS — K219 Gastro-esophageal reflux disease without esophagitis: Secondary | ICD-10-CM | POA: Diagnosis not present

## 2017-10-15 DIAGNOSIS — E785 Hyperlipidemia, unspecified: Secondary | ICD-10-CM | POA: Diagnosis not present

## 2017-10-15 DIAGNOSIS — Z7901 Long term (current) use of anticoagulants: Secondary | ICD-10-CM | POA: Diagnosis not present

## 2017-10-15 DIAGNOSIS — I4891 Unspecified atrial fibrillation: Secondary | ICD-10-CM | POA: Diagnosis not present

## 2017-10-15 DIAGNOSIS — I480 Paroxysmal atrial fibrillation: Secondary | ICD-10-CM | POA: Diagnosis not present

## 2017-10-15 DIAGNOSIS — Z8249 Family history of ischemic heart disease and other diseases of the circulatory system: Secondary | ICD-10-CM | POA: Insufficient documentation

## 2017-10-15 DIAGNOSIS — F329 Major depressive disorder, single episode, unspecified: Secondary | ICD-10-CM | POA: Insufficient documentation

## 2017-10-15 DIAGNOSIS — F419 Anxiety disorder, unspecified: Secondary | ICD-10-CM | POA: Diagnosis not present

## 2017-10-15 DIAGNOSIS — I10 Essential (primary) hypertension: Secondary | ICD-10-CM | POA: Insufficient documentation

## 2017-10-15 DIAGNOSIS — R002 Palpitations: Secondary | ICD-10-CM | POA: Diagnosis present

## 2017-10-15 LAB — HEPATIC FUNCTION PANEL
ALK PHOS: 57 U/L (ref 38–126)
ALT: 13 U/L — ABNORMAL LOW (ref 14–54)
AST: 22 U/L (ref 15–41)
Albumin: 3.6 g/dL (ref 3.5–5.0)
BILIRUBIN TOTAL: 0.4 mg/dL (ref 0.3–1.2)
Total Protein: 6.9 g/dL (ref 6.5–8.1)

## 2017-10-15 LAB — BASIC METABOLIC PANEL
Anion gap: 9 (ref 5–15)
BUN: 15 mg/dL (ref 6–20)
CO2: 28 mmol/L (ref 22–32)
CREATININE: 0.93 mg/dL (ref 0.44–1.00)
Calcium: 9.3 mg/dL (ref 8.9–10.3)
Chloride: 103 mmol/L (ref 101–111)
GFR, EST NON AFRICAN AMERICAN: 55 mL/min — AB (ref >60)
Glucose, Bld: 139 mg/dL — ABNORMAL HIGH (ref 65–99)
POTASSIUM: 4 mmol/L (ref 3.5–5.1)
SODIUM: 140 mmol/L (ref 135–145)

## 2017-10-15 LAB — CBC
HEMATOCRIT: 39.1 % (ref 35.0–47.0)
Hemoglobin: 13.5 g/dL (ref 12.0–16.0)
MCH: 31.1 pg (ref 26.0–34.0)
MCHC: 34.5 g/dL (ref 32.0–36.0)
MCV: 90.1 fL (ref 80.0–100.0)
PLATELETS: 291 10*3/uL (ref 150–440)
RBC: 4.35 MIL/uL (ref 3.80–5.20)
RDW: 14.5 % (ref 11.5–14.5)
WBC: 10 10*3/uL (ref 3.6–11.0)

## 2017-10-15 LAB — TROPONIN I: Troponin I: <0.03 ng/mL (ref <0.03)

## 2017-10-15 LAB — MAGNESIUM: MAGNESIUM: 2 mg/dL (ref 1.7–2.4)

## 2017-10-15 LAB — TSH: TSH: 3.701 u[IU]/mL (ref 0.350–4.500)

## 2017-10-15 MED ORDER — PANTOPRAZOLE SODIUM 40 MG PO TBEC
40.0000 mg | DELAYED_RELEASE_TABLET | Freq: Every day | ORAL | Status: DC
Start: 2017-10-15 — End: 2017-10-15
  Administered 2017-10-15: 40 mg via ORAL
  Filled 2017-10-15: qty 1

## 2017-10-15 MED ORDER — LEVOTHYROXINE SODIUM 100 MCG PO TABS
100.0000 ug | ORAL_TABLET | Freq: Every day | ORAL | Status: DC
  Administered 2017-10-15: 100 ug via ORAL
  Filled 2017-10-15: qty 1

## 2017-10-15 MED ORDER — ONDANSETRON HCL 4 MG PO TABS: 4.0000 mg | ORAL_TABLET | Freq: Four times a day (QID) | ORAL | Status: DC | PRN

## 2017-10-15 MED ORDER — ONDANSETRON HCL 4 MG/2ML IJ SOLN: 4.0000 mg | Freq: Four times a day (QID) | INTRAMUSCULAR | Status: DC | PRN

## 2017-10-15 MED ORDER — DOCUSATE SODIUM 100 MG PO CAPS
100.0000 mg | ORAL_CAPSULE | Freq: Two times a day (BID) | ORAL | Status: DC
  Filled 2017-10-15: qty 1

## 2017-10-15 MED ORDER — DILTIAZEM HCL 25 MG/5ML IV SOLN
10.0000 mg | Freq: Once | INTRAVENOUS | Status: AC
  Administered 2017-10-15: 5 mg via INTRAVENOUS

## 2017-10-15 MED ORDER — AMIODARONE HCL 400 MG PO TABS: ORAL_TABLET | ORAL | 1 refills | Status: DC

## 2017-10-15 MED ORDER — SUCRALFATE 1 G PO TABS
1.0000 g | ORAL_TABLET | Freq: Three times a day (TID) | ORAL | Status: DC
  Administered 2017-10-15 (×2): 1 g via ORAL
  Filled 2017-10-15 (×2): qty 1

## 2017-10-15 MED ORDER — SIMVASTATIN 20 MG PO TABS: 40.0000 mg | ORAL_TABLET | Freq: Every day | ORAL | Status: DC

## 2017-10-15 MED ORDER — ACETAMINOPHEN 325 MG PO TABS: 650.0000 mg | ORAL_TABLET | Freq: Four times a day (QID) | ORAL | Status: DC | PRN

## 2017-10-15 MED ORDER — METOPROLOL TARTRATE 25 MG PO TABS
12.5000 mg | ORAL_TABLET | Freq: Two times a day (BID) | ORAL | Status: DC
  Administered 2017-10-15: 12.5 mg via ORAL
  Filled 2017-10-15: qty 1

## 2017-10-15 MED ORDER — FLUOXETINE HCL 20 MG PO CAPS
40.0000 mg | ORAL_CAPSULE | Freq: Every day | ORAL | Status: DC
  Administered 2017-10-15: 40 mg via ORAL
  Filled 2017-10-15: qty 2

## 2017-10-15 MED ORDER — LOSARTAN POTASSIUM 50 MG PO TABS
50.0000 mg | ORAL_TABLET | Freq: Every day | ORAL | Status: DC
  Administered 2017-10-15: 50 mg via ORAL
  Filled 2017-10-15: qty 1

## 2017-10-15 MED ORDER — SODIUM CHLORIDE 0.9 % IV BOLUS (SEPSIS)
1000.0000 mL | Freq: Once | INTRAVENOUS | Status: AC
  Administered 2017-10-15: 1000 mL via INTRAVENOUS

## 2017-10-15 MED ORDER — VITAMIN D 1000 UNITS PO TABS
1000.0000 [IU] | ORAL_TABLET | Freq: Every day | ORAL | Status: DC
  Administered 2017-10-15: 1000 [IU] via ORAL
  Filled 2017-10-15: qty 1

## 2017-10-15 MED ORDER — ACETAMINOPHEN 650 MG RE SUPP: 650.0000 mg | Freq: Four times a day (QID) | RECTAL | Status: DC | PRN

## 2017-10-15 MED ORDER — APIXABAN 5 MG PO TABS
5.0000 mg | ORAL_TABLET | Freq: Two times a day (BID) | ORAL | Status: DC
  Administered 2017-10-15: 5 mg via ORAL
  Filled 2017-10-15: qty 1

## 2017-10-15 MED ORDER — AMIODARONE HCL 200 MG PO TABS
400.0000 mg | ORAL_TABLET | Freq: Two times a day (BID) | ORAL | Status: DC
  Administered 2017-10-15: 400 mg via ORAL
  Filled 2017-10-15: qty 2

## 2017-10-15 NOTE — Care Management Obs Status (Signed)
MEDICARE OBSERVATION STATUS NOTIFICATION   Patient Details  Name: Cyndi Lennertatricia J Hone MRN: [id number] Date of Birth: [birth year]   Medicare Observation Status Notification Given:  Yes    Chapman FitchBOWEN, Tyquarius Paglia T, RN 10/15/2017, 2:17 PM

## 2017-10-15 NOTE — Progress Notes (Signed)
[facility] responded to consult for pt who wanted to compete AD. [place] met pt and husband at bedside. [place] educated pt. Pt stated she will review and contact [place] when ready to complete AD. [facility] offered prayers and spiritual support to pt and husband. [place] to follow up as needed.    10/15/17 0800  Clinical Encounter Type  Visited With Patient;Patient and family together  Visit Type Initial;Other (Comment)  Referral From Nurse  Consult/Referral To Chaplain  Spiritual Encounters  Spiritual Needs Literature

## 2017-10-15 NOTE — ED Triage Notes (Signed)
Pt arrived via EMS from Home with complaints of waking up from sleep feeling as if her heart was "fluttering". Pt has a Hx of A-Fib. EMS reported that  Her HR was "normal" when they got to her and then it jumped to 170s to 180s. They tried the vagal manuvers but nothing was working. EMS gave 2.5 of Metoprolol IV. EMS inserted a 20 gauge in the Left AC. Pt is calm and denies pain. Family is at the bedside. Dr. Manson PasseyBrown is at the bedside.

## 2017-10-15 NOTE — Progress Notes (Signed)
Went over discharge instructions with the patient and family including medications and follow up appointment. Discontinue peripheral IV and telemetry monitor. Volunteer to transport patient.

## 2017-10-15 NOTE — Telephone Encounter (Signed)
TCM.... Pt is being discharged today She saw Dr Kirke CorinArida in hospital  Is coming on 10/25/17 to see Dr Mariah MillingGollan

## 2017-10-15 NOTE — Discharge Instructions (Signed)

## 2017-10-15 NOTE — Care Management CC44 (Signed)
Condition Code 44 Documentation Completed  Patient Details  Name: Stacey Duncan MRN: [id number] Date of Birth: 26-Jun-1931   Condition Code 44 given:  Yes Patient signature on Condition Code 44 notice:  Yes Documentation of 2 MD's agreement:  Yes Code 44 added to claim:  Yes    Chapman FitchBOWEN, Tayten Bergdoll T, RN 10/15/2017, 2:18 PM

## 2017-10-15 NOTE — Telephone Encounter (Signed)
Left voicemail message to call back  

## 2017-10-15 NOTE — H&P (Signed)
Stacey Duncan is an 83 y.o. female.   Chief Complaint: Palpitations HPI: The patient with past medical history of paroxysmal atrial fibrillation, hypothyroidism and hypertension presents to the emergency department complaining of racing heartbeat. The patient was nearly asleep in bed when she began to feel her heart race. She admits to feeling a tightness in her throat but she denies chest pain or shortness of breath. These symptoms have occurred before. They're always associated with dizziness. The patient denies nausea or vomiting, decreased vision, dysarthria or weakness or numbness in her extremities. Telemetry showed atrial fibrillation with rapid ventricular rate. She was given diltiazem in the emergency department which slowed her heart rate. However, the patient had repeated pauses greater than 4 seconds. She is quite aware of the pauses. Due to her recurrent atrial fibrillation with rapid ventricular rate as well as long pauses the emergency department staff called the hospitalist service for admission  Past Medical History:  Diagnosis Date  . Fibromyalgia   . GERD (gastroesophageal reflux disease)   . HTN (hypertension)   . Hypothyroidism   . Osteoarthritis    a. bilateral knees  . PAF (paroxysmal atrial fibrillation) (HCC)    a. on Eliquis;    Past Surgical History:  Procedure Laterality Date  . ABDOMINAL HYSTERECTOMY    . CHOLECYSTECTOMY    . ROTATOR CUFF REPAIR     left     Family History  Problem Relation Age of Onset  . Heart disease Mother 55       CABG  . Heart attack Maternal Grandfather   . Heart attack Maternal Grandmother    Social History:  reports that she has never smoked. She has never used smokeless tobacco. She reports that she drinks about 3.0 oz of alcohol per week . She reports that she does not use drugs.  Allergies:  Allergies  Allergen Reactions  . Sulfa Antibiotics Other (See Comments)  . Sulfacetamide Sodium     Other reaction(s): Other  (See Comments)    Medications Prior to Admission  Medication Sig Dispense Refill  . apixaban (ELIQUIS) 5 MG TABS tablet TAKE ONE (1) TABLET BY MOUTH TWO (2) TIMES DAILY 180 tablet 2  . Cholecalciferol (VITAMIN D-1000 MAX ST) 1000 UNITS tablet Take 1,000 Units by mouth daily.     Marland Kitchen FLUoxetine (PROZAC) 40 MG capsule Take 1 capsule (40 mg total) by mouth daily. 90 capsule 3  . levothyroxine (SYNTHROID, LEVOTHROID) 100 MCG tablet Take 1 tablet (100 mcg total) by mouth daily. 90 tablet 3  . losartan (COZAAR) 100 MG tablet Take 1 tablet (100 mg total) by mouth daily. (Patient taking differently: Take 50 mg by mouth daily. ) 30 tablet 0  . metoprolol tartrate (LOPRESSOR) 25 MG tablet TAKE 1 TABLET TWICE DAILY (Patient taking differently: TAKES 1/2 TABLET TWICE A DAY) 180 tablet 3  . omeprazole (PRILOSEC) 40 MG capsule Take 1 capsule (40 mg total) by mouth daily. 90 capsule 3  . simvastatin (ZOCOR) 40 MG tablet Take 1 tablet every other day as directed. (Patient taking differently: Take 40 mg by mouth at bedtime. ) 30 tablet 12  . sucralfate (CARAFATE) 1 g tablet Take 1 tablet (1 g total) by mouth 4 (four) times daily -  with meals and at bedtime. (Patient taking differently: Take 1 g by mouth 4 (four) times daily as needed (acid reflux). ) 30 tablet 1    Results for orders placed or performed during the hospital encounter of 10/15/17 (from the past  48 hour(s))  Basic metabolic panel     Status: Abnormal   Collection Time: 10/15/17  1:49 AM  Result Value Ref Range   Sodium 140 135 - 145 mmol/L   Potassium 4.0 3.5 - 5.1 mmol/L   Chloride 103 101 - 111 mmol/L   CO2 28 22 - 32 mmol/L   Glucose, Bld 139 (H) 65 - 99 mg/dL   BUN 15 6 - 20 mg/dL   Creatinine, Ser 0.93 0.44 - 1.00 mg/dL   Calcium 9.3 8.9 - 10.3 mg/dL   GFR calc non Af Amer 55 (L) >60 mL/min   GFR calc Af Amer >60 >60 mL/min    Comment: (NOTE) The eGFR has been calculated using the CKD EPI equation. This calculation has not been  validated in all clinical situations. eGFR's persistently <60 mL/min signify possible Chronic Kidney Disease.    Anion gap 9 5 - 15  CBC     Status: None   Collection Time: 10/15/17  1:49 AM  Result Value Ref Range   WBC 10.0 3.6 - 11.0 K/uL   RBC 4.35 3.80 - 5.20 MIL/uL   Hemoglobin 13.5 12.0 - 16.0 g/dL   HCT 39.1 35.0 - 47.0 %   MCV 90.1 80.0 - 100.0 fL   MCH 31.1 26.0 - 34.0 pg   MCHC 34.5 32.0 - 36.0 g/dL   RDW 14.5 11.5 - 14.5 %   Platelets 291 150 - 440 K/uL  Troponin I     Status: None   Collection Time: 10/15/17  1:49 AM  Result Value Ref Range   Troponin I <0.03 <0.03 ng/mL  TSH     Status: None   Collection Time: 10/15/17  1:49 AM  Result Value Ref Range   TSH 3.701 0.350 - 4.500 uIU/mL    Comment: Performed by a 3rd Generation assay with a functional sensitivity of <=0.01 uIU/mL.   Dg Chest Port 1 View  Result Date: 10/15/2017 CLINICAL DATA:  Fibrillation with rapid ventricular response. Dyspnea. EXAM: PORTABLE CHEST 1 VIEW COMPARISON:  03/13/2016 FINDINGS: Borderline heart size. No pulmonary vascular congestion. No edema or consolidation in the lungs. Slight fibrosis in the lung bases. No blunting of costophrenic angles. No pneumothorax. Calcification of the aorta. IMPRESSION: Mild cardiac enlargement.  No evidence of active pulmonary disease. Electronically Signed   By: Lucienne Capers M.D.   On: 10/15/2017 02:30    Review of Systems  Constitutional: Negative for chills and fever.  HENT: Negative for sore throat and tinnitus.   Eyes: Negative for blurred vision and redness.  Respiratory: Negative for cough and shortness of breath.   Cardiovascular: Positive for palpitations. Negative for chest pain, orthopnea and PND.  Gastrointestinal: Negative for abdominal pain, diarrhea, nausea and vomiting.  Genitourinary: Negative for dysuria, frequency and urgency.  Musculoskeletal: Negative for joint pain and myalgias.  Skin: Negative for rash.       No lesions   Neurological: Positive for dizziness. Negative for speech change, focal weakness and weakness.  Endo/Heme/Allergies: Does not bruise/bleed easily.       No temperature intolerance  Psychiatric/Behavioral: Negative for depression and suicidal ideas.    Blood pressure 120/77, pulse 62, temperature 98.8 F (37.1 C), temperature source Oral, resp. rate 18, height 5' 2"  (1.575 m), weight 67.7 kg (149 lb 4.8 oz), SpO2 97 %. Physical Exam  Vitals reviewed. Constitutional: She is oriented to person, place, and time. She appears well-developed and well-nourished. No distress.  HENT:  Head: Normocephalic and atraumatic.  Mouth/Throat: Oropharynx is clear and moist.  Eyes: Pupils are equal, round, and reactive to light. Conjunctivae and EOM are normal. No scleral icterus.  Neck: Normal range of motion. Neck supple. No JVD present. No tracheal deviation present. No thyromegaly present.  Cardiovascular: Normal rate, regular rhythm and normal heart sounds.  Exam reveals no gallop and no friction rub.   No murmur heard. Respiratory: Effort normal and breath sounds normal.  GI: Soft. Bowel sounds are normal. She exhibits no distension. There is no tenderness.  Genitourinary:  Genitourinary Comments: Deferred  Musculoskeletal: Normal range of motion. She exhibits no edema.  Lymphadenopathy:    She has no cervical adenopathy.  Neurological: She is alert and oriented to person, place, and time. No cranial nerve deficit. She exhibits normal muscle tone.  Skin: Skin is warm and dry. No rash noted. No erythema.  Psychiatric: She has a normal mood and affect. Her behavior is normal. Judgment and thought content normal.     Assessment/Plan This is an 83 year old female admitted for atrial fibrillation with rapid ventricular rate. 1. Atrial fibrillation: Rapid ventricular rate resolved after total diltiazem 5 mg2 IV. She is now in sinus rhythm. Continue metoprolol and Eliquis. 2. Sinus pauses: Prolonged;  Symptomatic. Continue to monitor telemetry closely. Consult cardiology 3. Hypertension: Controlled; continue losartan 4. Hypothyroidism: Check TSH; continue Synthroid 5. Hyperlipidemia: Continue statin therapy 6. Depression: Continue Prozac 7. DVT prophylaxis: SCDs 8. GI prophylaxis: PPI per home regimen The patient is a full code. Time spent on admission orders and patient care approximately 45 minutes  Harrie Foreman, MD 10/15/2017, 7:16 AM

## 2017-10-15 NOTE — ED Notes (Addendum)
Lurena Joinerebecca RN Gave 5mg  Diltazem at 1:55am and the second 5mg  at 2:08am for at total of 10mg  per Dr. Manson PasseyBrown.

## 2017-10-15 NOTE — Discharge Summary (Signed)
[facility] - [place] [facility]   PATIENT NAME: Stacey Duncan    MR#:  [id number]  DATE OF BIRTH:  1931-03-15  DATE OF ADMISSION:  10/15/2017 ADMITTING PHYSICIAN: Arnaldo Natal, MD   DATE OF DISCHARGE:   PRIMARY CARE PHYSICIAN: Smitty Cords, DO    ADMISSION DIAGNOSIS:  A-Fib  DISCHARGE DIAGNOSIS:  Afib with RVR--improved  SECONDARY DIAGNOSIS:   Past Medical History:  Diagnosis Date  . Fibromyalgia   . GERD (gastroesophageal reflux disease)   . HTN (hypertension)   . Hypothyroidism   . Osteoarthritis    a. bilateral knees  . PAF (paroxysmal atrial fibrillation) (HCC)    a. on Eliquis;    HOSPITAL COURSE:   Stacey Duncan is a 83 y.o. female with a hx of PAF on Elqiuis, fibromyalgia, GERD with esophageal pain, depression and anxiety who is being evaluation of Afib with RVR  1. PAF/pauses: -Converted to NSR followed Cardizem and has remained in sinus rhythm since -Added amiodarone 400 mg bid x 7 days, then 200 mg bid x 7 days, then 200 mg daily thereafter per Dr Kirke Corin -Continue Lopressor -Continue Eliquis 5 mg bid -CHADS2VASc at least 4 (HTN, age x 2, female) -HR in the 58-60  2. HTN: -Controlled -Continue PTA medications  3. HLD: -Statin  4. Hypothyroidism: -Synthroid   5. Depression/anxiety: -Needs follow up with PCP  Overall stable D/c home Spoke with cardiology and family  CONSULTS OBTAINED:  Treatment Team:  Iran Ouch, MD  DRUG ALLERGIES:   Allergies  Allergen Reactions  . Sulfa Antibiotics Other (See Comments)  . Sulfacetamide Sodium     Other reaction(s): Other (See Comments)    DISCHARGE MEDICATIONS:   Current Discharge Medication List    START taking these medications   Details  amiodarone (PACERONE) 400 MG tablet Take 400 mg bid x7 days then 200 mg bid x7 days then 200 mg qd Qty: 60 tablet, Refills: 1      CONTINUE these medications which have NOT CHANGED   Details  apixaban (ELIQUIS) 5 MG TABS tablet TAKE ONE (1) TABLET BY MOUTH TWO (2) TIMES DAILY Qty: 180 tablet, Refills: 2    Cholecalciferol (VITAMIN D-1000 MAX ST) 1000 UNITS tablet Take 1,000 Units by mouth daily.     FLUoxetine (PROZAC) 40 MG capsule Take 1 capsule (40 mg total) by mouth daily. Qty: 90 capsule, Refills: 3    levothyroxine (SYNTHROID, LEVOTHROID) 100 MCG tablet Take 1 tablet (100 mcg total) by mouth daily. Qty: 90 tablet, Refills: 3   Associated Diagnoses: Acquired hypothyroidism    losartan (COZAAR) 100 MG tablet Take 1 tablet (100 mg total) by mouth daily. Qty: 30 tablet, Refills: 0   Associated Diagnoses: Essential hypertension    metoprolol tartrate (LOPRESSOR) 25 MG tablet TAKE 1 TABLET TWICE DAILY Qty: 180 tablet, Refills: 3   Associated Diagnoses: Atrial fibrillation, unspecified    omeprazole (PRILOSEC) 40 MG capsule Take 1 capsule (40 mg total) by mouth daily. Qty: 90 capsule, Refills: 3   Associated Diagnoses: Gastroesophageal reflux disease, esophagitis presence not specified    simvastatin (ZOCOR) 40 MG tablet Take 1 tablet every other day as directed. Qty: 30 tablet, Refills: 12   Associated Diagnoses: Mixed hyperlipidemia    sucralfate (CARAFATE) 1 g tablet Take 1 tablet (1 g total) by mouth 4 (four) times daily -  with meals and at bedtime. Qty: 30 tablet, Refills: 1   Associated Diagnoses: Gastroesophageal reflux disease, esophagitis presence not  specified        If you experience worsening of your admission symptoms, develop shortness of breath, life threatening emergency, suicidal or homicidal thoughts you must seek medical attention immediately by calling 911 or calling your MD immediately  if symptoms less severe.  You Must read complete instructions/literature along with all the possible adverse reactions/side effects for all the Medicines you take and that have been prescribed to you. Take any new Medicines after you have completely  understood and accept all the possible adverse reactions/side effects.   Please note  You were cared for by a hospitalist during your hospital stay. If you have any questions about your discharge medications or the care you received while you were in the hospital after you are discharged, you can call the unit and asked to speak with the hospitalist on call if the hospitalist that took care of you is not available. Once you are discharged, your primary care physician will handle any further medical issues. Please note that NO REFILLS for any discharge medications will be authorized once you are discharged, as it is imperative that you return to your primary care physician (or establish a relationship with a primary care physician if you do not have one) for your aftercare needs so that they can reassess your need for medications and monitor your lab values. Today   SUBJECTIVE   Doing well  VITAL SIGNS:  Blood pressure (!) 126/51, pulse (!) 58, temperature 98.5 F (36.9 C), temperature source Oral, resp. rate 18, height 5\' 2"  (1.575 m), weight 67.7 kg (149 lb 4.8 oz), SpO2 95 %.  I/O:   Intake/Output Summary (Last 24 hours) at 10/15/17 1326 Last data filed at 10/15/17 0958  Gross per 24 hour  Intake             1240 ml  Output              250 ml  Net              990 ml    PHYSICAL EXAMINATION:  GENERAL:  83 y.o.-year-old patient lying in the bed with no acute distress.  EYES: Pupils equal, round, reactive to light and accommodation. No scleral icterus. Extraocular muscles intact.  HEENT: Head atraumatic, normocephalic. Oropharynx and nasopharynx clear.  NECK:  Supple, no jugular venous distention. No thyroid enlargement, no tenderness.  LUNGS: Normal breath sounds bilaterally, no wheezing, rales,rhonchi or crepitation. No use of accessory muscles of respiration.  CARDIOVASCULAR: S1, S2 normal. No murmurs, rubs, or gallops.  ABDOMEN: Soft, non-tender, non-distended. Bowel sounds  present. No organomegaly or mass.  EXTREMITIES: No pedal edema, cyanosis, or clubbing.  NEUROLOGIC: Cranial nerves II through XII are intact. Muscle strength 5/5 in all extremities. Sensation intact. Gait not checked.  PSYCHIATRIC: The patient is alert and oriented x 3.  SKIN: No obvious rash, lesion, or ulcer.   DATA REVIEW:   CBC   Recent Labs Lab 10/15/17 0149  WBC 10.0  HGB 13.5  HCT 39.1  PLT 291    Chemistries   Recent Labs Lab 10/15/17 0149 10/15/17 0500  NA 140  --   K 4.0  --   CL 103  --   CO2 28  --   GLUCOSE 139*  --   BUN 15  --   CREATININE 0.93  --   CALCIUM 9.3  --   MG  --  2.0  AST  --  22  ALT  --  13*  ALKPHOS  --  57  BILITOT  --  0.4    Microbiology Results   No results found for this or any previous visit (from the past 240 hour(s)).  RADIOLOGY:  Dg Chest Port 1 View  Result Date: 10/15/2017 CLINICAL DATA:  Fibrillation with rapid ventricular response. Dyspnea. EXAM: PORTABLE CHEST 1 VIEW COMPARISON:  03/13/2016 FINDINGS: Borderline heart size. No pulmonary vascular congestion. No edema or consolidation in the lungs. Slight fibrosis in the lung bases. No blunting of costophrenic angles. No pneumothorax. Calcification of the aorta. IMPRESSION: Mild cardiac enlargement.  No evidence of active pulmonary disease. Electronically Signed   By: Burman Nieves M.D.   On: 10/15/2017 02:30     Management plans discussed with the patient, family and they are in agreement.  CODE STATUS:     Code Status Orders        Start     Ordered   10/15/17 0448  Limited resuscitation (code)  Continuous    Question Answer Comment  In the event of cardiac or respiratory ARREST: Initiate Code Blue, Call Rapid Response Yes   In the event of cardiac or respiratory ARREST: Perform CPR Yes   In the event of cardiac or respiratory ARREST: Perform Intubation/Mechanical Ventilation No   In the event of cardiac or respiratory ARREST: Use NIPPV/BiPAp only if  indicated Yes   In the event of cardiac or respiratory ARREST: Administer ACLS medications if indicated Yes   In the event of cardiac or respiratory ARREST: Perform Defibrillation or Cardioversion if indicated Yes      10/15/17 0447    Code Status History    Date Active Date Inactive Code Status Order ID Comments User Context   03/13/2016  3:00 PM 03/14/2016  4:01 PM Partial Code [id number]  Adrian Saran, MD Inpatient      TOTAL TIME TAKING CARE OF THIS PATIENT: *40* minutes.    Lakeasha Petion M.D on 10/15/2017 at 1:26 PM  Between 7am to 6pm - Pager - [phone] After 6pm go to [email or web address] - Social research officer, government  [facility]  Office  [phone]  CC: Primary care physician; Smitty Cords, DO

## 2017-10-15 NOTE — Consult Note (Signed)
Cardiology Consultation:   Patient ID: Stacey Duncan; [id number]; 21-Mar-1931   Admit date: 10/15/2017 Date of Consult: 10/15/2017  Primary Care Provider: Smitty Cords, DO Primary Cardiologist: Mariah Milling   Patient Profile:   Stacey Duncan is a 83 y.o. female with a hx of PAF on Elqiuis, fibromyalgia, GERD with esophageal pain, depression and anxiety who is being seen today for the evaluation of Afib with RVR at the request of Diamond.  History of Present Illness:   [name] was initially diagnosed with Afib with RVR ~2015/2016. Placed on metoprolol and Eliquis. She has had multiple hospital admissions for Afib with RVR, most recently in 02/2016 in the setting of a UTI. Her episodes of Afib with RVR are typically associated with hiccups, belching and increased reflux. Most recent echo from 02/2016 admission showed EF 60-65%, normal wall motion, normal LV diastolic function, no significant valvular abnormalities, PASP normal. She has previously had to have metoprolol tapered due to fatigue. She has remained on Eliquis 5 mg bid without missing any doses. She is under increased stress and anxiety at home with her son going through a divorce and moving back in with her. He is also not employed. Her husband is having issues with his hearing aids. She overall feels depressed and anxious. There are times she does not feel like getting out of bed, though makes herself.   She was in her usual state of health until around 2 AM on 10/19 when she got up to use the restroom. Shortly after voiding, she developed sudden onset of palpitations with increased belching, hiccups,a and reflux. She knew shew as back in Afib. Upon the patient's arrival to [facility] they were found to be in Afib with RVR with heart rate in the 160s bpm. BP 93/57 that has since improved to 126.51, HR 122 bpm, temp 98.8, oxygen saturation 92% on room air, weight 149 pounds. EKG showed Afib with RVR as below, CXR showed  mild CM without acute process. Labs showed troponin negative x 1, TSH normal, K+ 4.0, SCr 0.93, unremarkable CBC. She was started on Cardizem with improvement in heart rates. She was noted to have 3.2-3.3 second pauses followed by conducted beat with subsequent resumption of Afib with RVR. These episodes were associated with increased dizziness. She did convert to sinus rhythm this morning and has remained in sinus rhythm since.   Past Medical History:  Diagnosis Date  . Fibromyalgia   . GERD (gastroesophageal reflux disease)   . HTN (hypertension)   . Hypothyroidism   . Osteoarthritis    a. bilateral knees  . PAF (paroxysmal atrial fibrillation) (HCC)    a. on Eliquis;    Past Surgical History:  Procedure Laterality Date  . ABDOMINAL HYSTERECTOMY    . CHOLECYSTECTOMY    . ROTATOR CUFF REPAIR     left      Home Meds: Prior to Admission medications   Medication Sig Start Date End Date Taking? Authorizing Provider  apixaban (ELIQUIS) 5 MG TABS tablet TAKE ONE (1) TABLET BY MOUTH TWO (2) TIMES DAILY 03/29/17  Yes Gollan, Tollie Pizza, MD  Cholecalciferol (VITAMIN D-1000 MAX ST) 1000 UNITS tablet Take 1,000 Units by mouth daily.    Yes [provider]  FLUoxetine (PROZAC) 40 MG capsule Take 1 capsule (40 mg total) by mouth daily. 04/29/17  Yes Karamalegos, Netta Neat, DO  levothyroxine (SYNTHROID, LEVOTHROID) 100 MCG tablet Take 1 tablet (100 mcg total) by mouth daily. 05/21/17  Yes Karamalegos,  Netta Neat, DO  losartan (COZAAR) 100 MG tablet Take 1 tablet (100 mg total) by mouth daily. Patient taking differently: Take 50 mg by mouth daily.  12/03/16  Yes Janeann Forehand., MD  metoprolol tartrate (LOPRESSOR) 25 MG tablet TAKE 1 TABLET TWICE DAILY Patient taking differently: TAKES 1/2 TABLET TWICE A DAY 07/31/16  Yes Gollan, Tollie Pizza, MD  omeprazole (PRILOSEC) 40 MG capsule Take 1 capsule (40 mg total) by mouth daily. 04/29/17  Yes Karamalegos, Netta Neat, DO  simvastatin (ZOCOR) 40  MG tablet Take 1 tablet every other day as directed. Patient taking differently: Take 40 mg by mouth at bedtime.  02/01/17  Yes Janeann Forehand., MD  sucralfate (CARAFATE) 1 g tablet Take 1 tablet (1 g total) by mouth 4 (four) times daily -  with meals and at bedtime. Patient taking differently: Take 1 g by mouth 4 (four) times daily as needed (acid reflux).  05/25/17  Yes Smitty Cords, DO    Inpatient Medications: Scheduled Meds: . amiodarone  400 mg Oral BID  . apixaban  5 mg Oral BID  . cholecalciferol  1,000 Units Oral Daily  . docusate sodium  100 mg Oral BID  . FLUoxetine  40 mg Oral Daily  . levothyroxine  100 mcg Oral QAC breakfast  . losartan  50 mg Oral Daily  . metoprolol tartrate  12.5 mg Oral BID  . pantoprazole  40 mg Oral Daily  . simvastatin  40 mg Oral q1800  . sucralfate  1 g Oral TID WC & HS   Continuous Infusions:  PRN Meds: acetaminophen **OR** acetaminophen, ondansetron **OR** ondansetron (ZOFRAN) IV  Allergies:   Allergies  Allergen Reactions  . Sulfa Antibiotics Other (See Comments)  . Sulfacetamide Sodium     Other reaction(s): Other (See Comments)    Social History:   Social History   Social History  . Marital status: Married    Spouse name: N/A  . Number of children: N/A  . Years of education: N/A   Occupational History  . Not on file.   Social History Main Topics  . Smoking status: Never Smoker  . Smokeless tobacco: Never Used  . Alcohol use 3.0 oz/week    5 Glasses of wine per week     Comment: glass of wine daily  . Drug use: No  . Sexual activity: Not on file   Other Topics Concern  . Not on file   Social History Narrative  . No narrative on file     Family History:  Family History  Problem Relation Age of Onset  . Heart disease Mother 13       CABG  . Heart attack Maternal Grandfather   . Heart attack Maternal Grandmother     ROS:  Review of Systems  Constitutional: Positive for malaise/fatigue.  Negative for chills, diaphoresis, fever and weight loss.  HENT: Negative for congestion.   Eyes: Negative for discharge and redness.  Respiratory: Negative for cough, hemoptysis, sputum production, shortness of breath and wheezing.   Cardiovascular: Positive for palpitations. Negative for chest pain, orthopnea, claudication, leg swelling and PND.  Gastrointestinal: Positive for heartburn. Negative for abdominal pain, blood in stool, melena, nausea and vomiting.  Genitourinary: Negative for hematuria.  Musculoskeletal: Negative for falls and myalgias.  Skin: Negative for rash.  Neurological: Positive for dizziness and weakness. Negative for tingling, tremors, sensory change, speech change, focal weakness, seizures, loss of consciousness and headaches.  Endo/Heme/Allergies: Does not bruise/bleed easily.  Psychiatric/Behavioral: Positive for depression. Negative for substance abuse. The patient is nervous/anxious.   All other systems reviewed and are negative.     Physical Exam/Data:   Vitals:   10/15/17 0215 10/15/17 0501 10/15/17 0900 10/15/17 0900  BP: 114/66 120/77 (!) 126/51 (!) 126/51  Pulse: (!) 119 62 (!) 58 (!) 58  Resp: 18 18 18 18   Temp:  98.8 F (37.1 C) 98.5 F (36.9 C) 98.5 F (36.9 C)  TempSrc:  Oral Oral Oral  SpO2: 96% 97% 95% 95%  Weight:  149 lb 4.8 oz (67.7 kg)    Height:        Intake/Output Summary (Last 24 hours) at 10/15/17 1014 Last data filed at 10/15/17 0958  Gross per 24 hour  Intake             1240 ml  Output              250 ml  Net              990 ml   Filed Weights   10/15/17 0142 10/15/17 0501  Weight: 149 lb (67.6 kg) 149 lb 4.8 oz (67.7 kg)   Body mass index is 27.31 kg/m.   Physical Exam: General: Well developed, well nourished, in no acute distress. Head: Normocephalic, atraumatic, sclera non-icteric, no xanthomas, nares are without discharge. Neck: Negative for carotid bruits. JVD not elevated. Lungs: Clear bilaterally to  auscultation without wheezes, rales, or rhonchi. Breathing is unlabored. Heart: RRR with S1 S2. No murmurs, rubs, or gallops appreciated. Abdomen: Soft, non-tender, non-distended with normoactive bowel sounds. No hepatomegaly. No rebound/guarding. No obvious abdominal masses. Msk:  Strength and tone appear normal for age. Extremities: No clubbing or cyanosis. No edema. Distal pedal pulses are 2+ and equal bilaterally. Neuro: Alert and oriented X 3. No facial asymmetry. No focal deficit. Moves all extremities spontaneously. Psych:  Responds to questions appropriately with a normal affect.   EKG:  The EKG was personally reviewed and demonstrates:  Afib with RVR, 162 bpm, 1.4 second pause followed by conducted beat, nonspecific st/t changes Telemetry:  Telemetry was personally reviewed and demonstrates:  Afib with RRV, 120s-160s bpm, several pauses of 3.2-3.3 seconds followed by conducted beats, converted to NSR this morning  Weights: Filed Weights   10/15/17 0142 10/15/17 0501  Weight: 149 lb (67.6 kg) 149 lb 4.8 oz (67.7 kg)    Relevant CV Studies: TTE 02/2016: Study Conclusions  - Procedure narrative: Transthoracic echocardiography. Image   quality was poor. The study was technically difficult, as a   result of poor sound wave transmission. - Left ventricle: The cavity size was normal. Systolic function was   normal. The estimated ejection fraction was in the range of 60%   to 65%. Wall motion was normal; there were no regional wall   motion abnormalities. Left ventricular diastolic function   parameters were normal. - Right ventricle: Systolic function was normal. - Pulmonary arteries: Systolic pressure was within the normal   range.  Impressions:  - Normal study.  Laboratory Data:  Chemistry Recent Labs Lab 10/15/17 0149  NA 140  K 4.0  CL 103  CO2 28  GLUCOSE 139*  BUN 15  CREATININE 0.93  CALCIUM 9.3  GFRNONAA 55*  GFRAA >60  ANIONGAP 9    No results for  input(s): PROT, ALBUMIN, AST, ALT, ALKPHOS, BILITOT in the last 168 hours. Hematology Recent Labs Lab 10/15/17 0149  WBC 10.0  RBC 4.35  HGB 13.5  HCT  39.1  MCV 90.1  MCH 31.1  MCHC 34.5  RDW 14.5  PLT 291   Cardiac Enzymes Recent Labs Lab 10/15/17 0149  TROPONINI <0.03   BNPNo results for input(s): BNP, PROBNP in the last 168 hours.  DDimer No results for input(s): DDIMER in the last 168 hours.  Radiology/Studies:  Dg Chest Port 1 View  Result Date: 10/15/2017 IMPRESSION: Mild cardiac enlargement.  No evidence of active pulmonary disease. Electronically Signed   By: Burman NievesWilliam  Stevens M.D.   On: 10/15/2017 02:30    Assessment and Plan:   1. PAF/pauses: -Converted to NSR followed Cardizem and has remained in sinus rhythm since -Multiple admissions for Afib with RVR over the past 2-3 years -Add amiodarone 400 mg bid x 7 days, then 200 mg bid x 7 days, then 200 mg daily thereafter -Continue Lopressor -Continue Eliquis 5 mg bid -Check LFT given amiodarone initiation  -Initiation of amiodarone will hopefully help with the noted pauses as above, no indication for PPM at this time -She will need yearly eye exam and PFTs -CHADS2VASc at least 4 (HTN, age x 2, female) -Ambulate, if heart rate and rhythm remain well controlled, she can likely go home today once Dr. Kirke CorinArida has rounded on the patient   2. HTN: -Controlled -Continue PTA medications  3. HLD: -Statin  4. Hypothyroidism: -Synthroid   5. Depression/anxiety: -Needs follow up with PCP   For questions or updates, please contact [facility] Please consult [email or web address] for contact info under Cardiology/STEMI.   Signed, Eula Listenyan Marikay Roads, PA-C [facility] Pager: [phone] 10/15/2017, 10:14 AM

## 2017-10-18 NOTE — Telephone Encounter (Signed)
Patient contacted regarding discharge from [facility] on 10/15/17.  Patient understands to follow up with provider Dr. Mariah MillingGollan on 10/25/17 at 08[facility]00[facility]. Patient understands discharge instructions? Yes Patient understands medications and regiment? Yes Patient understands to bring all medications to this visit? Yes  Patient had no further questions or concerns at this time.

## 2017-10-20 ENCOUNTER — Encounter: Payer: Self-pay | Admitting: Family Medicine

## 2017-10-20 ENCOUNTER — Ambulatory Visit (INDEPENDENT_AMBULATORY_CARE_PROVIDER_SITE_OTHER): Payer: Medicare PPO | Admitting: Family Medicine

## 2017-10-20 VITALS — BP 128/66 | HR 45 | Temp 97.9°F | Resp 16 | Ht 62.0 in | Wt 150.0 lb

## 2017-10-20 DIAGNOSIS — F339 Major depressive disorder, recurrent, unspecified: Secondary | ICD-10-CM | POA: Diagnosis not present

## 2017-10-20 DIAGNOSIS — F411 Generalized anxiety disorder: Secondary | ICD-10-CM | POA: Insufficient documentation

## 2017-10-20 DIAGNOSIS — I4891 Unspecified atrial fibrillation: Secondary | ICD-10-CM | POA: Diagnosis not present

## 2017-10-20 DIAGNOSIS — Z23 Encounter for immunization: Secondary | ICD-10-CM

## 2017-10-20 MED ORDER — FLUOXETINE HCL 20 MG PO TABS: ORAL_TABLET | ORAL | 0 refills | Status: DC

## 2017-10-20 NOTE — Assessment & Plan Note (Signed)
History of PAF, now with AFib RVR episode, presumed trigger of stress and anxiety, without known illness. S/p Cardizem conversion in hospital and now on Amiodarone seems to be in sinus rhythm, has rate control and anticoagulation [facility]  Plan: 1. Continue Amiodarone load and taper as prescribed - caution with patient already hypothyroidism, will need to monitor TSH closely - Also recommend dose adjust Simvastatin from 40 to 20mg  daily due to drug-drug interaction with inc potency of Simvastatin on admio. Appreciate input from Cardiology. 2. Will try to control anxiety/mood better to limit physical stress and potential worsening AFib 3. Follow-up as planned with Cardiology next week, return criteria reviewed

## 2017-10-20 NOTE — Assessment & Plan Note (Signed)
Acute on chronic flare of MDD now with recurrence and also associated presumed new GAD vs chronic anxiety, mixed with depression. Now with worsening causing more difficulty functioning, previously coped well, now affecting physically with tired, fatigue, overeating, poor sleep, anxiety, stress, and now with hospitalization from AFib triggered. - Primary stressor is family relationship stress with son living at home -GAD7: 8015, very difficult / PHQ9: 18 very difficult - Failed: Sertraline (and other SSRI) >10 years ago - Has therapist scheduled 1-2 weeks  Plan: 1. Discussion on life stressors and her history of depression with management options - Agree to stick with known Fluoxetine since continues to be effective but now may need higher dose with recent flare. Caution with switching or adding meds due to recent hospitalization and Amiodarone started - INCREASE Fluoxetine from 40mg  daily (capsule) to 60mg  daily - take 40mg  capsules + add new rx Fluoxetine 20mg  tablet, for 1 month supply may take 2-3 week for full effect, if helping will notify office and we can send 60mg  capsule to mail order 2. Advised recommend therapy / counseling in future - scheduled already 3. Follow-up 4 weeks anxiety, med adjust, GAD7/PHQ9

## 2017-10-20 NOTE — Progress Notes (Signed)
Subjective:    Patient ID: Stacey Duncan, female    DOB: 1931/05/12, 83 y.o.   MRN: [id number]  Stacey Duncan is a 83 y.o. female presenting on 10/20/2017 for Hospitalization Follow-up (Afib)   HPI  HOSPITAL FOLLOW-UP VISIT  Hospital/Location: [facility] Date of Admission: 10/15/17 Date of Discharge: 10/15/17 Transitions of care telephone call: Not completed  Reason for Admission: Atrial Fibrillation Primary (+Secondary) Diagnosis: AFib with RVR, Depression / Anxiety  - Hospital H&P and Discharge Summary have been reviewed - Patient presents today 5 days after recent hospitalization. Brief summary of recent course, patient had symptoms of rapid heart rate and heartburn, states that on 10/19, she woke up around 0100 to use bathroom, then returned to bed and felt heart rate rapid, throat full, heartburn, she was scared for heart attack and then she called 911 via EMS. She had been feeling fine previously without recent illness or other known trigger. Found to be in AFib RVR, hospitalized for < 24 hours, treated with Cardizem IV and still had some refractory episodes intermittently with AFib RVR and then transitioned to Amiodarone per Cardiology, discharged on Amiodarone and same other meds. - Today reports overall has done well after discharge. Symptoms of AFib and acid reflux have resolved  - New medications on discharge: Amiodarone - currently on 400mg  BID (200mg  tabs), then on 10/26 will reduce dose to Amiodarone 200mg  BID, then 7 days later will be down to Amiodarone 200mg  - Changes to current meds on discharge: no changes - Follow-up with [facility] Dr Mariah Milling on Monday 0800  DEPRESSION with ANXIETY, History of chronic recurrent MDD: - Chronic problem for patient, not discussed with me yet in office. She reviews chronic history of recurrent episodes MDD usually related to life stressors, she has been on Fluoxetine for many years, was followed by Dr Stacey Duncan in past, she was  initially tried on Zoloft and possibly a few other SSRI medications >10 years ago with limited relief. Has been stable on Fluoxetine for while. - Now complaining of recent worsening life stressors that seems to have triggered depression flare and anxiety, and she thinks stress was affecting her heart with AFib. - Currently now of family relationship stress with her son Stacey Duncan) 47 yr old who lives at home causing emotional and mental distress with her and her husband, she reviews his history with him returning home following marriage/divorce, and now unemployed, and Sales promotion account executive regarding need to get job, causing conflict with family - She states that he does not cause any harm at home, he does not use substances other than occasional alcohol, there is a "clash" between he and his father. Also she states she thinks her son has a learning disability and has affected him most of his life, now it seems to be really limiting him - Reading keeps her occupied and "saved her" from avoiding some of her anxiety over this issue - On Fluoxetine 40mg , tolerating well but now not sure if effective - She has already scheduled an appointment with a therapist in 1-2 weeks, arranged through a friend, through a Health visitor. She does not know name of therapist today - See PHQ and GAD - Denies any suicidal or homicidal ideation  Health Maintenance: - Due for Flu Shot, will receive today   Depression screen [facility] 2/9 10/20/2017 07/20/2017 02/01/2017  Decreased Interest 2 0 0  Down, Depressed, Hopeless 2 0 0  PHQ - 2 Score 4 0 0  Altered sleeping 2 - -  Tired, decreased energy 3 - -  Change in appetite 3 - -  Feeling bad or failure about yourself  3 - -  Trouble concentrating 2 - -  Moving slowly or fidgety/restless 1 - -  Suicidal thoughts 0 - -  PHQ-9 Score 18 - -  Difficult doing work/chores Very difficult - -   GAD 7 : Generalized Anxiety Score 10/20/2017  Nervous, Anxious, on Edge 3  Control/stop worrying  3  Worry too much - different things 0  Trouble relaxing 1  Restless 3  Easily annoyed or irritable 2  Afraid - awful might happen 3  Total GAD 7 Score 15  Anxiety Difficulty Very difficult    I have reviewed the discharge medication list, and have reconciled the current and discharge medications today.   Current Outpatient Prescriptions:  .  amiodarone (PACERONE) 400 MG tablet, Take 400 mg bid x7 days then 200 mg bid x7 days then 200 mg qd, Disp: 60 tablet, Rfl: 1 .  apixaban (ELIQUIS) 5 MG TABS tablet, TAKE ONE (1) TABLET BY MOUTH TWO (2) TIMES DAILY, Disp: 180 tablet, Rfl: 2 .  Cholecalciferol (VITAMIN D-1000 MAX ST) 1000 UNITS tablet, Take 1,000 Units by mouth daily. , Disp: , Rfl:  .  FLUoxetine (PROZAC) 40 MG capsule, Take 1 capsule (40 mg total) by mouth daily., Disp: 90 capsule, Rfl: 3 .  levothyroxine (SYNTHROID, LEVOTHROID) 100 MCG tablet, Take 1 tablet (100 mcg total) by mouth daily., Disp: 90 tablet, Rfl: 3 .  losartan (COZAAR) 100 MG tablet, Take 1 tablet (100 mg total) by mouth daily. (Patient taking differently: Take 50 mg by mouth daily. ), Disp: 30 tablet, Rfl: 0 .  metoprolol tartrate (LOPRESSOR) 25 MG tablet, TAKE 1 TABLET TWICE DAILY (Patient taking differently: TAKES 1/2 TABLET TWICE A DAY), Disp: 180 tablet, Rfl: 3 .  omeprazole (PRILOSEC) 40 MG capsule, Take 1 capsule (40 mg total) by mouth daily., Disp: 90 capsule, Rfl: 3 .  simvastatin (ZOCOR) 40 MG tablet, Take 1 tablet every other day as directed. (Patient taking differently: Take 40 mg by mouth at bedtime. ), Disp: 30 tablet, Rfl: 12 .  sucralfate (CARAFATE) 1 g tablet, Take 1 tablet (1 g total) by mouth 4 (four) times daily -  with meals and at bedtime. (Patient taking differently: Take 1 g by mouth 4 (four) times daily as needed (acid reflux). ), Disp: 30 tablet, Rfl: 1 .  amiodarone (PACERONE) 200 MG tablet, , Disp: , Rfl:  .  FLUoxetine (PROZAC) 20 MG tablet, Take one daily with existing rx Fluoxetine 40mg   capsule, for total daily dose of 60mg , Disp: 30 tablet, Rfl: 0  -------------------------------------------------------------------------  Social History  Substance Use Topics  . Smoking status: Never Smoker  . Smokeless tobacco: Never Used  . Alcohol use 3.0 oz/week    5 Glasses of wine per week     Comment: glass of wine daily    Review of Systems Per HPI unless specifically indicated above     Objective:    BP 128/66 (BP Location: Right Arm, Patient Position: Sitting, Cuff Size: Normal)   Pulse (!) 45   Temp 97.9 F (36.6 C) (Oral)   Resp 16   Ht 5\' 2"  (1.575 m)   Wt 150 lb (68 kg)   BMI 27.44 kg/m   Wt Readings from Last 3 Encounters:  10/20/17 150 lb (68 kg)  10/15/17 149 lb 4.8 oz (67.7 kg)  09/14/17 150 lb 4 oz (68.2 kg)  Physical Exam  Constitutional: She is oriented to person, place, and time. She appears well-developed and well-nourished. No distress.  Well-appearing, comfortable, cooperative  HENT:  Head: Normocephalic and atraumatic.  Mouth/Throat: Oropharynx is clear and moist.  Eyes: Conjunctivae are normal. Right eye exhibits no discharge. Left eye exhibits no discharge.  Neck: Normal range of motion. Neck supple.  Cardiovascular: Normal rate, normal heart sounds and intact distal pulses.   No murmur heard. Some ectopy heard but does not seem consistent with AFib  Pulmonary/Chest: Effort normal.  Neurological: She is alert and oriented to person, place, and time.  Skin: Skin is warm and dry. No rash noted. She is not diaphoretic. No erythema.  Psychiatric: She has a normal mood and affect. Her behavior is normal.  Well groomed, good eye contact, normal speech and thoughts. Depressed mood appropriately when discussing life stressor, without crying spell, good insight into mental health. Does not appear anxious today  Nursing note and vitals reviewed.  Results for orders placed or performed during the hospital encounter of 10/15/17  Basic metabolic  panel  Result Value Ref Range   Sodium 140 135 - 145 mmol/L   Potassium 4.0 3.5 - 5.1 mmol/L   Chloride 103 101 - 111 mmol/L   CO2 28 22 - 32 mmol/L   Glucose, Bld 139 (H) 65 - 99 mg/dL   BUN 15 6 - 20 mg/dL   Creatinine, Ser 1.61 0.44 - 1.00 mg/dL   Calcium 9.3 8.9 - 09.6 mg/dL   GFR calc non Af Amer 55 (L) >60 mL/min   GFR calc Af Amer >60 >60 mL/min   Anion gap 9 5 - 15  CBC  Result Value Ref Range   WBC 10.0 3.6 - 11.0 K/uL   RBC 4.35 3.80 - 5.20 MIL/uL   Hemoglobin 13.5 12.0 - 16.0 g/dL   HCT 04.5 40.9 - 81.1 %   MCV 90.1 80.0 - 100.0 fL   MCH 31.1 26.0 - 34.0 pg   MCHC 34.5 32.0 - 36.0 g/dL   RDW 91.4 78.2 - 95.6 %   Platelets 291 150 - 440 K/uL  Troponin I  Result Value Ref Range   Troponin I <0.03 <0.03 ng/mL  TSH  Result Value Ref Range   TSH 3.701 0.350 - 4.500 uIU/mL  Hepatic function panel  Result Value Ref Range   Total Protein 6.9 6.5 - 8.1 g/dL   Albumin 3.6 3.5 - 5.0 g/dL   AST 22 15 - 41 U/L   ALT 13 (L) 14 - 54 U/L   Alkaline Phosphatase 57 38 - 126 U/L   Total Bilirubin 0.4 0.3 - 1.2 mg/dL   Bilirubin, Direct <2.1 (L) 0.1 - 0.5 mg/dL   Indirect Bilirubin NOT CALCULATED 0.3 - 0.9 mg/dL  Magnesium  Result Value Ref Range   Magnesium 2.0 1.7 - 2.4 mg/dL      Assessment & Plan:   Problem List Items Addressed This Visit    Atrial fibrillation with RVR (HCC) - Primary    History of PAF, now with AFib RVR episode, presumed trigger of stress and anxiety, without known illness. S/p Cardizem conversion in hospital and now on Amiodarone seems to be in sinus rhythm, has rate control and anticoagulation Followed by [facility]  Plan: 1. Continue Amiodarone load and taper as prescribed - caution with patient already hypothyroidism, will need to monitor TSH closely - Also recommend dose adjust Simvastatin from 40 to 20mg  daily due to drug-drug interaction with inc potency  of Simvastatin on admio. Appreciate input from Cardiology. 2. Will try to control  anxiety/mood better to limit physical stress and potential worsening AFib 3. Follow-up as planned with Cardiology next week, return criteria reviewed      Relevant Medications   amiodarone (PACERONE) 200 MG tablet   GAD (generalized anxiety disorder)    Acute on chronic flare of MDD now with recurrence and also associated presumed new GAD vs chronic anxiety, mixed with depression. Now with worsening causing more difficulty functioning, previously coped well, now affecting physically with tired, fatigue, overeating, poor sleep, anxiety, stress, and now with hospitalization from AFib triggered. - Primary stressor is family relationship stress with son living at home -GAD7: 3115, very difficult / PHQ9: 18 very difficult - Failed: Sertraline (and other SSRI) >10 years ago - Has therapist scheduled 1-2 weeks  Plan: 1. Discussion on life stressors and her history of depression with management options - Agree to stick with known Fluoxetine since continues to be effective but now may need higher dose with recent flare. Caution with switching or adding meds due to recent hospitalization and Amiodarone started - INCREASE Fluoxetine from 40mg  daily (capsule) to 60mg  daily - take 40mg  capsules + add new rx Fluoxetine 20mg  tablet, for 1 month supply may take 2-3 week for full effect, if helping will notify office and we can send 60mg  capsule to mail order 2. Advised recommend therapy / counseling in future - scheduled already 3. Follow-up 4 weeks anxiety, med adjust, GAD7/PHQ9      Relevant Medications   FLUoxetine (PROZAC) 20 MG tablet   Major depression, recurrent, chronic (HCC)    Acute on chronic flare of MDD now with recurrence and also associated presumed new GAD vs chronic anxiety, mixed with depression. Now with worsening causing more difficulty functioning, previously coped well, now affecting physically with tired, fatigue, overeating, poor sleep, anxiety, stress, and now with hospitalization from  AFib triggered. - Primary stressor is family relationship stress with son living at home -GAD7: 4815, very difficult / PHQ9: 18 very difficult - Failed: Sertraline (and other SSRI) >10 years ago - Has therapist scheduled 1-2 weeks  Plan: 1. Discussion on life stressors and her history of depression with management options - Agree to stick with known Fluoxetine since continues to be effective but now may need higher dose with recent flare. Caution with switching or adding meds due to recent hospitalization and Amiodarone started - INCREASE Fluoxetine from 40mg  daily (capsule) to 60mg  daily - take 40mg  capsules + add new rx Fluoxetine 20mg  tablet, for 1 month supply may take 2-3 week for full effect, if helping will notify office and we can send 60mg  capsule to mail order 2. Advised recommend therapy / counseling in future - scheduled already 3. Follow-up 4 weeks anxiety, med adjust, GAD7/PHQ9      Relevant Medications   FLUoxetine (PROZAC) 20 MG tablet    Other Visit Diagnoses    Needs flu shot       Relevant Orders   Flu vaccine HIGH DOSE PF (Completed)       Meds ordered this encounter  Medications  . amiodarone (PACERONE) 200 MG tablet  . FLUoxetine (PROZAC) 20 MG tablet    Sig: Take one daily with existing rx Fluoxetine 40mg  capsule, for total daily dose of 60mg     Dispense:  30 tablet    Refill:  0    Follow up plan: Return in about 4 weeks (around 11/17/2017) for Depression PHQ / Anxiety GAD,  med refills.  Saralyn Pilar, DO [facility] 10/20/2017, 3:22 PM

## 2017-10-20 NOTE — Assessment & Plan Note (Signed)
Acute on chronic flare of MDD now with recurrence and also associated presumed new GAD vs chronic anxiety, mixed with depression. Now with worsening causing more difficulty functioning, previously coped well, now affecting physically with tired, fatigue, overeating, poor sleep, anxiety, stress, and now with hospitalization from AFib triggered. - Primary stressor is family relationship stress with son living at home -GAD7: 15, very difficult / PHQ9: 18 very difficult - Failed: Sertraline (and other SSRI) >10 years ago - Has therapist scheduled 1-2 weeks  Plan: 1. Discussion on life stressors and her history of depression with management options - Agree to stick with known Fluoxetine since continues to be effective but now may need higher dose with recent flare. Caution with switching or adding meds due to recent hospitalization and Amiodarone started - INCREASE Fluoxetine from 40mg daily (capsule) to 60mg daily - take 40mg capsules + add new rx Fluoxetine 20mg tablet, for 1 month supply may take 2-3 week for full effect, if helping will notify office and we can send 60mg capsule to mail order 2. Advised recommend therapy / counseling in future - scheduled already 3. Follow-up 4 weeks anxiety, med adjust, GAD7/PHQ9 

## 2017-10-20 NOTE — ED Provider Notes (Signed)
[facility] Emergency Department Provider Note    I have reviewed the triage vital signs and the nursing notes.   HISTORY  Chief Complaint Palpitations (Hx of A-Fib)    HPI Stacey Duncan is a 83 y.o. female with below list of chronic medical conditions including paroxysmal atrial fibrillationcurrently taking Eloquis presents to the emergency department withawakening from sleep this morning with rapid irregular heartbeat. EMS states that on their arrival patient's heart rate noted to be in the 170s to 180s and variable. Patient denies any chest pain or dyspnea. Patient does over admit to dizziness. EMS states that they administered 2.5 mg of IV metoprolol before arrival. Patient current heart rate at bedside still 170s.   Past Medical History:  Diagnosis Date  . Fibromyalgia   . GERD (gastroesophageal reflux disease)   . HTN (hypertension)   . Hypothyroidism   . Osteoarthritis    a. bilateral knees  . PAF (paroxysmal atrial fibrillation) (HCC)    a. on Eliquis;    Patient Active Problem List   Diagnosis Date Noted  . GAD (generalized anxiety disorder) 10/20/2017  . GERD (gastroesophageal reflux disease) 04/28/2017  . Essential hypertension 07/28/2016  . Elevated troponin 03/16/2016  . Atrial fibrillation with RVR (HCC)   . Hyperlipidemia 01/31/2016  . Fibromyalgia 12/16/2015  . Chronic fatigue 04/19/2015  . Thyroid activity decreased 04/19/2015  . Major depression, recurrent, chronic (HCC) 04/19/2015    Past Surgical History:  Procedure Laterality Date  . ABDOMINAL HYSTERECTOMY    . CHOLECYSTECTOMY    . ROTATOR CUFF REPAIR     left     Prior to Admission medications   Medication Sig Start Date End Date Taking? Authorizing Provider  apixaban (ELIQUIS) 5 MG TABS tablet TAKE ONE (1) TABLET BY MOUTH TWO (2) TIMES DAILY 03/29/17  Yes Gollan, Tollie Pizza, MD  Cholecalciferol (VITAMIN D-1000 MAX ST) 1000 UNITS tablet Take 1,000 Units by mouth  daily.    Yes [provider]  FLUoxetine (PROZAC) 40 MG capsule Take 1 capsule (40 mg total) by mouth daily. 04/29/17  Yes Karamalegos, Netta Neat, DO  levothyroxine (SYNTHROID, LEVOTHROID) 100 MCG tablet Take 1 tablet (100 mcg total) by mouth daily. 05/21/17  Yes Karamalegos, Netta Neat, DO  losartan (COZAAR) 100 MG tablet Take 1 tablet (100 mg total) by mouth daily. Patient taking differently: Take 50 mg by mouth daily.  12/03/16  Yes Janeann Forehand., MD  metoprolol tartrate (LOPRESSOR) 25 MG tablet TAKE 1 TABLET TWICE DAILY Patient taking differently: TAKES 1/2 TABLET TWICE A DAY 07/31/16  Yes Gollan, Tollie Pizza, MD  omeprazole (PRILOSEC) 40 MG capsule Take 1 capsule (40 mg total) by mouth daily. 04/29/17  Yes Karamalegos, Netta Neat, DO  simvastatin (ZOCOR) 40 MG tablet Take 1 tablet every other day as directed. Patient taking differently: Take 40 mg by mouth at bedtime.  02/01/17  Yes Janeann Forehand., MD  sucralfate (CARAFATE) 1 g tablet Take 1 tablet (1 g total) by mouth 4 (four) times daily -  with meals and at bedtime. Patient taking differently: Take 1 g by mouth 4 (four) times daily as needed (acid reflux).  05/25/17  Yes Karamalegos, Netta Neat, DO  amiodarone (PACERONE) 200 MG tablet  10/15/17   [provider]  amiodarone (PACERONE) 400 MG tablet Take 400 mg bid x7 days then 200 mg bid x7 days then 200 mg qd 10/15/17   Enedina Finner, MD  FLUoxetine (PROZAC) 20 MG tablet Take  one daily with existing rx Fluoxetine 40mg  capsule, for total daily dose of 60mg  10/20/17   Karamalegos, Netta NeatAlexander J, DO    Allergies Sulfa antibiotics and Sulfacetamide sodium  Family History  Problem Relation Age of Onset  . Heart disease Mother [id number]       CABG  . Heart attack Maternal Grandfather   . Heart attack Maternal Grandmother     Social History Social History  Substance Use Topics  . Smoking status: Never Smoker  . Smokeless tobacco: Never Used  . Alcohol use 3.0 oz/week     5 Glasses of wine per week     Comment: glass of wine daily    Review of Systems Constitutional: No fever/chills Eyes: No visual changes. ENT: No sore throat. Cardiovascular: Denies chest pain.positive for heart palpitations and rapid heartbeat Respiratory: Denies shortness of breath. Gastrointestinal: No abdominal pain.  No nausea, no vomiting.  No diarrhea.  No constipation. Genitourinary: Negative for dysuria. Musculoskeletal: Negative for neck pain.  Negative for back pain. Integumentary: Negative for rash. Neurological: Negative for headaches, focal weakness or numbness.   ____________________________________________   PHYSICAL EXAM:  VITAL SIGNS: ED Triage Vitals  Enc Vitals Group     BP 10/15/17 0200 (!) 93/57     Pulse Rate 10/15/17 0200 73     Resp 10/15/17 0200 (!) 23     Temp 10/15/17 0501 98.8 F (37.1 C)     Temp Source 10/15/17 0501 Oral     SpO2 10/15/17 0200 92 %     Weight 10/15/17 0142 67.6 kg (149 lb)     Height 10/15/17 0142 1.575 m (5\' 2" )     Head Circumference --      Peak Flow --      Pain Score --      Pain Loc --      Pain Edu? --      Excl. in GC? --     Constitutional: Alert and oriented. Well appearing and in no acute distress. Eyes: Conjunctivae are normal. Head: Atraumatic. Mouth/Throat: Mucous membranes are moist.  Oropharynx non-erythematous. Neck: No stridor.  Cardiovascular:tachycardia with a irregular rhythm Good peripheral circulation. Grossly normal heart sounds. Respiratory: Normal respiratory effort.  No retractions. Lungs CTAB. Gastrointestinal: Soft and nontender. No distention.  Musculoskeletal: No lower extremity tenderness nor edema. No gross deformities of extremities. Neurologic:  Normal speech and language. No gross focal neurologic deficits are appreciated.  Skin:  Skin is warm, dry and intact. No rash noted. Psychiatric: Mood and affect are normal. Speech and behavior are  normal.  ____________________________________________   LABS (all labs ordered are listed, but only abnormal results are displayed)  Labs Reviewed  BASIC METABOLIC PANEL - Abnormal; Notable for the following:       Result Value   Glucose, Bld 139 (*)    GFR calc non Af Amer 55 (*)    All other components within normal limits  HEPATIC FUNCTION PANEL - Abnormal; Notable for the following:    ALT 13 (*)    Bilirubin, Direct <0.1 (*)    All other components within normal limits  CBC  TROPONIN I  TSH  MAGNESIUM   ____________________________________________  EKG  ED ECG REPORT I, [place] Kailash Hinze, the attending physician, personally viewed and interpreted this ECG.   Date: 10/20/2017  EKG Time: 1:45AM  Rate: 162  Rhythm: atrial fibrillation with rapid ventricular response with 4 second pause  Axis: Normal  Intervals:None  ST&T Change: None  PROCEDURES  Critical Care performed: CRITICAL CARE Performed by: Darci Current   Total critical care time: 45 minutes  Critical care time was exclusive of separately billable procedures and treating other patients.  Critical care was necessary to treat or prevent imminent or life-threatening deterioration.  Critical care was time spent personally by me on the following activities: development of treatment plan with patient and/or surrogate as well as nursing, discussions with consultants, evaluation of patient's response to treatment, examination of patient, obtaining history from patient or surrogate, ordering and performing treatments and interventions, ordering and review of laboratory studies, ordering and review of radiographic studies, pulse oximetry and re-evaluation of patient's condition.   Procedures   ____________________________________________   INITIAL IMPRESSION / ASSESSMENT AND PLAN / ED COURSE  As part of my medical decision making, I reviewed the following data within the electronic MEDICAL RECORD NUMBER 83 year old female presenting with above stated history of physical exam consistent with atrial fibrillation with rapid ventricular response with associated hypotension. Patient received diltiazem 10 mg IV with improvement in heart ratewith improvement in heart rate. Patient became rate controlled following diltiazem administration. Patient continued to have frequent cardiac pauses lasting as much as 7-8 seconds. Patient discussed with Dr. Jens Som cardiologist on-call who agreed with plan for admission for further cardiology evaluation.she remained normotensive during those events however became symptomatic with acute dizziness    ____________________________________________  FINAL CLINICAL IMPRESSION(S) / ED DIAGNOSES  Final diagnoses:  Atrial fibrillation with rapid ventricular response (HCC)     MEDICATIONS GIVEN DURING THIS VISIT:  Medications  diltiazem (CARDIZEM) injection 10 mg (5 mg Intravenous Given 10/15/17 0155)  sodium chloride 0.9 % bolus 1,000 mL (0 mLs Intravenous Stopped 10/15/17 0439)     NEW OUTPATIENT MEDICATIONS STARTED DURING THIS VISIT:  Discharge Medication List as of 10/15/2017  2:46 PM    START taking these medications   Details  amiodarone (PACERONE) 400 MG tablet Take 400 mg bid x7 days then 200 mg bid x7 days then 200 mg qd, Normal        Discharge Medication List as of 10/15/2017  2:46 PM      Discharge Medication List as of 10/15/2017  2:46 PM       Note:  This document was prepared using Dragon voice recognition software and may include unintentional dictation errors.    Darci Current, MD 10/21/17 [phone]

## 2017-10-20 NOTE — Patient Instructions (Addendum)
Thank you for coming to the clinic today.  1. For the medication interaction  - New medicine Amiodarone can increase the potency of Simvastatin - recommended to be on MAX dose of 20mg  Simvastatin only, if on this combination - Recommend to CUT Simvastatin 40mg  in HALF for dose 20mg  for now - Please review this with Dr Mariah MillingGollan on Monday  2. Increase Fluoxetine from 40mg  capsule daily to total dose 60mg  = take ONE additional 20mg  tablet daily at same time, may take 2-3 weeks for you to realize full benefit  Follow-up with your therapist as scheduled.  Please schedule a Follow-up Appointment to: Return in about 4 weeks (around 11/17/2017) for Depression PHQ / Anxiety GAD, med refills.  If you have any other questions or concerns, please feel free to call the clinic or send a message through MyChart. You may also schedule an earlier appointment if necessary.  Additionally, you may be receiving a survey about your experience at our clinic within a few days to 1 week by e-mail or mail. We value your feedback.  Saralyn PilarAlexander Wataru Mccowen, DO [facility]

## 2017-10-22 ENCOUNTER — Ambulatory Visit: Payer: Self-pay | Admitting: Family Medicine
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
## 2017-10-25 ENCOUNTER — Encounter: Payer: Self-pay | Admitting: Cardiovascular Disease

## 2017-10-25 ENCOUNTER — Ambulatory Visit (INDEPENDENT_AMBULATORY_CARE_PROVIDER_SITE_OTHER): Payer: Medicare PPO | Admitting: Cardiovascular Disease

## 2017-10-25 VITALS — BP 140/62 | HR 47 | Ht 63.0 in | Wt 153.5 lb

## 2017-10-25 DIAGNOSIS — E782 Mixed hyperlipidemia: Secondary | ICD-10-CM | POA: Diagnosis not present

## 2017-10-25 DIAGNOSIS — I10 Essential (primary) hypertension: Secondary | ICD-10-CM | POA: Diagnosis not present

## 2017-10-25 DIAGNOSIS — I4891 Unspecified atrial fibrillation: Secondary | ICD-10-CM

## 2017-10-25 DIAGNOSIS — M797 Fibromyalgia: Secondary | ICD-10-CM | POA: Diagnosis not present

## 2017-10-25 NOTE — Patient Instructions (Addendum)
Medication Instructions:   Please decrease the amiodarone down to one a day  For atrial fib episode, Take another amiodarone and metoprolol  Labwork:  No new labs needed  Testing/Procedures:  No further testing at this time   Follow-Up: It was a pleasure seeing you in the office today. Please call us if you have new issues that need to be addressed before your next appt.  [phone]  Your physician wants you to follow-up in: 12 months.  You will receive a reminder letter in the mail two months in advance. If you don't receive a letter, please call our office to schedule the follow-up appointment.  If you need a refill on your cardiac medications before your next appointment, please call your pharmacy.

## 2017-11-09 ENCOUNTER — Ambulatory Visit
Admission: RE | Admit: 2017-11-09 | Discharge: 2017-11-09 | Disposition: A | Discharge status: Home / Self Care | Payer: Medicare PPO | Source: Ambulatory Visit | Attending: Family Medicine | Admitting: Family Medicine

## 2017-11-09 ENCOUNTER — Ambulatory Visit: Payer: Medicare PPO | Admitting: Family Medicine

## 2017-11-09 ENCOUNTER — Encounter: Payer: Self-pay | Admitting: Family Medicine

## 2017-11-09 ENCOUNTER — Other Ambulatory Visit: Payer: Self-pay | Admitting: Family Medicine

## 2017-11-09 VITALS — BP 108/44 | HR 52 | Temp 98.3°F | Resp 16 | Ht 62.0 in | Wt 149.8 lb

## 2017-11-09 DIAGNOSIS — F411 Generalized anxiety disorder: Secondary | ICD-10-CM

## 2017-11-09 DIAGNOSIS — R5382 Chronic fatigue, unspecified: Secondary | ICD-10-CM | POA: Diagnosis not present

## 2017-11-09 DIAGNOSIS — W19XXXA Unspecified fall, initial encounter: Secondary | ICD-10-CM | POA: Insufficient documentation

## 2017-11-09 DIAGNOSIS — M79641 Pain in right hand: Secondary | ICD-10-CM

## 2017-11-09 DIAGNOSIS — E032 Hypothyroidism due to medicaments and other exogenous substances: Secondary | ICD-10-CM

## 2017-11-09 DIAGNOSIS — I10 Essential (primary) hypertension: Secondary | ICD-10-CM

## 2017-11-09 DIAGNOSIS — M8580 Other specified disorders of bone density and structure, unspecified site: Secondary | ICD-10-CM | POA: Insufficient documentation

## 2017-11-09 DIAGNOSIS — F339 Major depressive disorder, recurrent, unspecified: Secondary | ICD-10-CM | POA: Diagnosis not present

## 2017-11-09 DIAGNOSIS — Z79899 Other long term (current) drug therapy: Secondary | ICD-10-CM

## 2017-11-09 DIAGNOSIS — I4891 Unspecified atrial fibrillation: Secondary | ICD-10-CM

## 2017-11-09 DIAGNOSIS — S62326A Displaced fracture of shaft of fifth metacarpal bone, right hand, initial encounter for closed fracture: Secondary | ICD-10-CM | POA: Diagnosis not present

## 2017-11-09 DIAGNOSIS — M25531 Pain in right wrist: Secondary | ICD-10-CM

## 2017-11-09 DIAGNOSIS — R7309 Other abnormal glucose: Secondary | ICD-10-CM

## 2017-11-09 DIAGNOSIS — E559 Vitamin D deficiency, unspecified: Secondary | ICD-10-CM

## 2017-11-09 MED ORDER — FLUOXETINE HCL 40 MG PO CAPS: 40.0000 mg | ORAL_CAPSULE | Freq: Every day | ORAL | 3 refills | Status: DC

## 2017-11-09 NOTE — Progress Notes (Signed)
Subjective:    Patient ID: Stacey Duncan, female    DOB: [birth year], 83 y.o.   MRN: [id number]  Stacey Duncan is a 83 y.o. female presenting on 11/09/2017 for Fall (onset 2-3 weeks Right hand is sore after fall has some bruce)   HPI   FOLLOW-UP AFib on Chronic Anticoagulation / Hypothyroidism / Fatigue / Daytime Sleepiness / Insomnia - Last visit with me 10/24, for same problem and saw Dr Mariah Milling on 10/24, treated with reduced Amiodarone to 200mg  daily, continued Metop 12.5mg  BID, see prior notes for background information. - Interval update with seems to have some worsening of her chronic fatigue, uncertain exact timeline - Today patient reports fatigue seems worse more recently, feels tired during day, some difficulty sleeping but then other nights can sleep okay, also feels like "mind is in a fog", not feeling as alert - States she feels similar to prior low thyroid in past, however TSH was normal in hospital 10/15/17, and it has only been 3 weeks. - She is unsure if side effects from Amiodarone or also med change with inc Fluoxetine from 40 to 60mg  is about similar onset - Denies any sleep apnea episodes, near syncope, syncope, palpitations, chest pain, dyspnea, exertional symptoms  Fall on outstretched hand / Right Hand Pain - Reports onset fall 2-3 weeks ago, fall at home, had pain and bruising and swelling of R hand 5th metacarpal / MCP region, extended into wrist - Some improvement in swelling and bruising, but still painful at times, limiting her function of R hand  DEPRESSION with ANXIETY, History of chronic recurrent MDD: - Last visit with me 10/20/17, for last visit for same problem, treated with increased dose Fluoxetine from 40mg  daily to 60mg  daily (added 20 to the 40mg ), see prior notes for background information. - Interval update with her son Fredrik Cove now has a job, and this is a big stress relief for her, and overall feels improved - Today patient reports improved  anxiety and mood, but now main complaint is fatigue and feeling tired, see above - Continues Fluoxetine 60mg  daily (40 + 20mg ), unsure if side effects - She saw a hospice therapist for counseling with improvement - See PHQ and GAD - Denies any suicidal or homicidal ideation  Depression screen [facility] 2/9 11/09/2017 10/20/2017 07/20/2017  Decreased Interest 1 2 0  Down, Depressed, Hopeless 1 2 0  PHQ - 2 Score 2 4 0  Altered sleeping 3 2 -  Tired, decreased energy 3 3 -  Change in appetite 2 3 -  Feeling bad or failure about yourself  1 3 -  Trouble concentrating 0 2 -  Moving slowly or fidgety/restless 0 1 -  Suicidal thoughts 0 0 -  PHQ-9 Score 11 18 -  Difficult doing work/chores Somewhat difficult Very difficult -   GAD 7 : Generalized Anxiety Score 11/09/2017 10/20/2017  Nervous, Anxious, on Edge 1 3  Control/stop worrying 3 3  Worry too much - different things 2 0  Trouble relaxing 0 1  Restless 0 3  Easily annoyed or irritable 3 2  Afraid - awful might happen 1 3  Total GAD 7 Score 10 15  Anxiety Difficulty Somewhat difficult Very difficult    Social History   Tobacco Use  . Smoking status: Never Smoker  . Smokeless tobacco: Never Used  Substance Use Topics  . Alcohol use: Yes    Alcohol/week: 3.0 oz    Types: 5 Glasses of wine per week  Comment: glass of wine daily  . Drug use: No    Review of Systems Per HPI unless specifically indicated above     Objective:    BP (!) 108/44   Pulse (!) 52   Temp 98.3 F (36.8 C) (Oral)   Resp 16   Ht 5\' 2"  (1.575 m)   Wt 149 lb 12.8 oz (67.9 kg)   BMI 27.40 kg/m   Wt Readings from Last 3 Encounters:  11/09/17 149 lb 12.8 oz (67.9 kg)  10/25/17 153 lb 8 oz (69.6 kg)  10/20/17 150 lb (68 kg)    Physical Exam  Constitutional: She is oriented to person, place, and time. She appears well-developed and well-nourished. No distress.  Well-appearing, comfortable, cooperative  HENT:  Head: Normocephalic and atraumatic.   Mouth/Throat: Oropharynx is clear and moist.  Eyes: Conjunctivae are normal. Right eye exhibits no discharge. Left eye exhibits no discharge.  Neck: Normal range of motion. Neck supple. No thyromegaly present.  Cardiovascular: Normal rate, regular rhythm, normal heart sounds and intact distal pulses.  No murmur heard. No ectopy  Pulmonary/Chest: Effort normal and breath sounds normal. No respiratory distress. She has no wheezes. She has no rales.  Musculoskeletal:  Right Hand/Wrist Inspection: localized mild edema of 5th MCP and some mild ecchymosis fading of distal ulna, otherwise normal appearance, no erythema Palpation: Tender over 5th MCP and metacarpal and distal ulnar styloid, non tender radially including anatomical snuff box ROM: full active wrist ROM flex / ext, ulnar / radial deviation, pain with full grip flexion Strength: 5/5 grip, thumb opposition, wrist flex/ext Neurovascular: distally intact  Neurological: She is alert and oriented to person, place, and time.  Skin: Skin is warm and dry. No rash noted. She is not diaphoretic. No erythema.  Psychiatric: She has a normal mood and affect. Her behavior is normal.  Well groomed, good eye contact, normal speech and thoughts  Nursing note and vitals reviewed.   I have personally reviewed the radiology report from 11/09/17.  CLINICAL DATA:  Larey SeatFell with outstretched hands 2-3 weeks ago.  Pain.  EXAM: RIGHT HAND - COMPLETE 3+ VIEW  COMPARISON:  None.  FINDINGS: There is a spiral fracture of the fifth metacarpal, distal shaft, slight overriding of fracture fragments, with soft tissue swelling. No appreciable callus. There is no dislocation at the fifth MCP joint. There is no ulnar styloid fracture. There is narrowing of the radiocarpal joint and significant degenerative change at the first [facility].There is osteopenia.  IMPRESSION: Spiral fracture of the fifth metacarpal, distal shaft, with slight overriding. Soft  tissue swelling.  No ulnar styloid fracture.   Electronically Signed   By: Elsie StainJohn T Curnes M.D.   On: 11/09/2017 14:44  Results for orders placed or performed during the hospital encounter of 10/15/17  Basic metabolic panel  Result Value Ref Range   Sodium 140 135 - 145 mmol/L   Potassium 4.0 3.5 - 5.1 mmol/L   Chloride 103 101 - 111 mmol/L   CO2 28 22 - 32 mmol/L   Glucose, Bld 139 (H) 65 - 99 mg/dL   BUN 15 6 - 20 mg/dL   Creatinine, Ser 1.610.93 0.44 - 1.00 mg/dL   Calcium 9.3 8.9 - 09.610.3 mg/dL   GFR calc non Af Amer 55 (L) >60 mL/min   GFR calc Af Amer >60 >60 mL/min   Anion gap 9 5 - 15  CBC  Result Value Ref Range   WBC 10.0 3.6 - 11.0 K/uL   RBC  4.35 3.80 - 5.20 MIL/uL   Hemoglobin 13.5 12.0 - 16.0 g/dL   HCT 82.939.1 56.235.0 - 13.047.0 %   MCV 90.1 80.0 - 100.0 fL   MCH 31.1 26.0 - 34.0 pg   MCHC 34.5 32.0 - 36.0 g/dL   RDW 86.514.5 78.411.5 - 69.614.5 %   Platelets 291 150 - 440 K/uL  Troponin I  Result Value Ref Range   Troponin I <0.03 <0.03 ng/mL  TSH  Result Value Ref Range   TSH 3.701 0.350 - 4.500 uIU/mL  Hepatic function panel  Result Value Ref Range   Total Protein 6.9 6.5 - 8.1 g/dL   Albumin 3.6 3.5 - 5.0 g/dL   AST 22 15 - 41 U/L   ALT 13 (L) 14 - 54 U/L   Alkaline Phosphatase 57 38 - 126 U/L   Total Bilirubin 0.4 0.3 - 1.2 mg/dL   Bilirubin, Direct <2.9<0.1 (L) 0.1 - 0.5 mg/dL   Indirect Bilirubin NOT CALCULATED 0.3 - 0.9 mg/dL  Magnesium  Result Value Ref Range   Magnesium 2.0 1.7 - 2.4 mg/dL      Assessment & Plan:   Problem List Items Addressed This Visit    Chronic fatigue    Long history of chronic fatigue, now gradual worse recently, other constellation of symptoms uncertain if medication side effect seems most likely since improved mood/anxiety - Possibly hypothyroidism now on amiodarone could be worse - Concern side effect amio, or higher dose fluoxetine - Lastly concern for possible symptomatic bradycardia if her fatigue is not due to other organic  cause  Plan: 1. In 3 weeks - Chemistry, CBC, TSH, Free T4, Vit D and B12 in 3 weeks 2. Reduce Fluoxetine back from 60 to 40mg  daily 3. Cont Levo 100 - adjust in future if need 4. Follow-up 4 weeks to re-adjust meds review labs - if no identifiable cause, then would have her return to cardiology sooner to discuss amio and brady  Lastly - emphasized if any significant worsening of symptoms and HR remain slow should seek more immediate help and notify Cardiology      GAD (generalized anxiety disorder) - Primary    Dramatic improvement, still persistent with complicated fatigue symptoms - Improved major stress now with her son who lives at home has a job - Still presume affecting physically with tired, fatigue, overeating, poor sleep, anxiety, stress - Primary stressor is family relationship stress with son living at home -GAD7: 3315 >10 , very to somewhat difficult / PHQ9: 18 > 11 very to somewhat - Failed: Sertraline (and other SSRI) >10 years ago - Has therapist w/ hospice  Plan: 1. Concern that potential side effect from Fluoxetine higher dose may be contributing to her fatigue and fog, multiple other causes - will try to reduce Fluoxetine to see if improves - LOWER dose back to 40mg  daily - refilled capsules to mail order, STOP 20mg  additional 2. Continue therapy as needed 3. Follow-up 3-4 weeks lab review and med adjust, GAD7/PHQ9      Relevant Medications   FLUoxetine (PROZAC) 40 MG capsule   Hypothyroidism    Currently fatigue similar to prior hypothyroidism Continues Levothyroxine 100mcg daily Recent hospitalization had TSH checked at 3.7,  Normal Less likely cause of her symptoms but possible worsening thyroid since starting amiodarone  Plan: 1. Re-check TSH, Free T4 in 3 weeks for total 6 week after initiation of med 2. Continue Levo 100mcg daily for now - may need to adjust, or future concern amiodarone  if affecting her significantly       Major depression, recurrent,  chronic (HCC)    Dramatic improvement, still persistent with complicated fatigue symptoms - Improved major stress now with her son who lives at home has a job - Still presume affecting physically with tired, fatigue, overeating, poor sleep, anxiety, stress - Primary stressor is family relationship stress with son living at home -GAD7: 27 >10 , very to somewhat difficult / PHQ9: 18 > 11 very to somewhat - Failed: Sertraline (and other SSRI) >10 years ago - Has therapist w/ hospice  Plan: 1. Concern that potential side effect from Fluoxetine higher dose may be contributing to her fatigue and fog, multiple other causes - will try to reduce Fluoxetine to see if improves - LOWER dose back to 40mg  daily - refilled capsules to mail order, STOP 20mg  additional 2. Continue therapy as needed 3. Follow-up 3-4 weeks lab review and med adjust, GAD7/PHQ9      Relevant Medications   FLUoxetine (PROZAC) 40 MG capsule    Other Visit Diagnoses    Fall, initial encounter       Right hand pain     Concern for possible fracture with R hand 5th MCP/metacarpal pain swelling bruising following FOOSH at home, now >3 week duration. - Agrees to check X-ray today, reviewed result in office with fracture, she declined Ortho, proceed with medical supply wrist/hand splint for support, already 3 weeks into fracture, called her after result finalized to review radiology report again       Meds ordered this encounter  Medications  . FLUoxetine (PROZAC) 40 MG capsule    Sig: Take 1 capsule (40 mg total) daily by mouth.    Dispense:  90 capsule    Refill:  3    Follow up plan: Return in about 3 weeks (around 11/30/2017) for Labs and Med Review / Fatigue.  Future labs ordered for 3 weeks  Saralyn Pilar, DO [facility] 11/09/2017, 11:52 PM

## 2017-11-09 NOTE — Assessment & Plan Note (Addendum)
Currently fatigue similar to prior hypothyroidism Continues Levothyroxine daily Recent hospitalization had TSH checked at 3.7,  Normal Less likely cause of her symptoms but possible worsening thyroid since starting amiodarone  Plan: 1. Re-check TSH, Free T4 in 3 weeks for total 6 week after initiation of med 2. Continue Levo daily for now - may need to adjust, or future concern amiodarone if affecting her significantly

## 2017-11-09 NOTE — Patient Instructions (Addendum)
Thank you for coming to the clinic today.  1. Right wrist / little finger, with bruising, we will check X-ray today - Notify you of results - Regardless I do recommend a wrist splint  Use RICE therapy: - R - Rest / relative rest with activity modification avoid overuse of joint - I - Ice packs (make sure you use a towel or sock / something to protect skin) - C - Compression with WRist SPlint ACE wrap to apply pressure and reduce swelling allowing more support - E - Elevation - if significant swelling, lift leg above heart level (toes above your nose) to help reduce swelling, most helpful at night after day of being on your feet  2. Reduce dose Fluoxetine from 60mg  (20 + 40) back down to 40mg  daily for now - [facility] [place] Address: [address], [place], [place] [address] Open until 6PM Phone: [phone]  [facility] [address] [place], [place] [address] Open until 5PM Phone: [phone]  3.  DUE for NON FASTING for thyroid and blood count  SCHEDULE "Lab Only" visit in the morning at the clinic for lab draw in 3 WEEKS  - Make sure Lab Only appointment is at about 1 week before your next appointment, so that results will be available  Please schedule a Follow-up Appointment to: Return in about 3 weeks (around 11/30/2017) for Labs and Med Review / Fatigue.  If you have any other questions or concerns, please feel free to call the clinic or send a message through MyChart. You may also schedule an earlier appointment if necessary.  Additionally, you may be receiving a survey about your experience at our clinic within a few days to 1 week by e-mail or mail. We value your feedback.  Saralyn PilarAlexander Karamalegos, DO [facility]

## 2017-11-09 NOTE — Assessment & Plan Note (Signed)
Dramatic improvement, still persistent with complicated fatigue symptoms - Improved major stress now with her son who lives at home has a job - Still presume affecting physically with tired, fatigue, overeating, poor sleep, anxiety, stress - Primary stressor is family relationship stress with son living at home -GAD7: 1515 >10 , very to somewhat difficult / PHQ9: 18 > 11 very to somewhat - Failed: Sertraline (and other SSRI) >10 years ago - Has therapist w/ hospice  Plan: 1. Concern that potential side effect from Fluoxetine higher dose may be contributing to her fatigue and fog, multiple other causes - will try to reduce Fluoxetine to see if improves - LOWER dose back to 40mg  daily - refilled capsules to mail order, STOP 20mg  additional 2. Continue therapy as needed 3. Follow-up 3-4 weeks lab review and med adjust, GAD7/PHQ9

## 2017-11-09 NOTE — Assessment & Plan Note (Signed)
Dramatic improvement, still persistent with complicated fatigue symptoms - Improved major stress now with her son who lives at home has a job - Still presume affecting physically with tired, fatigue, overeating, poor sleep, anxiety, stress - Primary stressor is family relationship stress with son living at home -GAD7: 15 >10 , very to somewhat difficult / PHQ9: 18 > 11 very to somewhat - Failed: Sertraline (and other SSRI) >10 years ago - Has therapist w/ hospice  Plan: 1. Concern that potential side effect from Fluoxetine higher dose may be contributing to her fatigue and fog, multiple other causes - will try to reduce Fluoxetine to see if improves - LOWER dose back to 40mg daily - refilled capsules to mail order, STOP 20mg additional 2. Continue therapy as needed 3. Follow-up 3-4 weeks lab review and med adjust, GAD7/PHQ9 

## 2017-11-09 NOTE — Assessment & Plan Note (Signed)
Long history of chronic fatigue, now gradual worse recently, other constellation of symptoms uncertain if medication side effect seems most likely since improved mood/anxiety - Possibly hypothyroidism now on amiodarone could be worse - Concern side effect amio, or higher dose fluoxetine - Lastly concern for possible symptomatic bradycardia if her fatigue is not due to other organic cause  Plan: 1. In 3 weeks - Chemistry, CBC, TSH, Free T4, Vit D and B12 in 3 weeks 2. Reduce Fluoxetine back from 60 to 40mg  daily 3. Cont Levo 100 - adjust in future if need 4. Follow-up 4 weeks to re-adjust meds review labs - if no identifiable cause, then would have her return to cardiology sooner to discuss amio and brady  Lastly - emphasized if any significant worsening of symptoms and HR remain slow should seek more immediate help and notify Cardiology

## 2017-11-16 ENCOUNTER — Ambulatory Visit: Payer: Medicare PPO | Admitting: Family Medicine

## 2017-11-22 ENCOUNTER — Other Ambulatory Visit: Payer: Medicare PPO

## 2017-11-22 DIAGNOSIS — E559 Vitamin D deficiency, unspecified: Secondary | ICD-10-CM

## 2017-11-22 DIAGNOSIS — R5382 Chronic fatigue, unspecified: Secondary | ICD-10-CM

## 2017-11-22 DIAGNOSIS — Z79899 Other long term (current) drug therapy: Secondary | ICD-10-CM

## 2017-11-22 DIAGNOSIS — E032 Hypothyroidism due to medicaments and other exogenous substances: Secondary | ICD-10-CM

## 2017-11-22 DIAGNOSIS — I10 Essential (primary) hypertension: Secondary | ICD-10-CM

## 2017-11-22 DIAGNOSIS — I4891 Unspecified atrial fibrillation: Secondary | ICD-10-CM

## 2017-11-22 DIAGNOSIS — R7309 Other abnormal glucose: Secondary | ICD-10-CM

## 2017-11-23 ENCOUNTER — Encounter: Payer: Self-pay | Admitting: Family Medicine

## 2017-11-23 DIAGNOSIS — R7303 Prediabetes: Secondary | ICD-10-CM | POA: Insufficient documentation

## 2017-11-23 LAB — CBC WITH DIFFERENTIAL/PLATELET
Basophils Absolute: 87 cells/uL (ref 0–200)
Basophils Relative: 1 %
EOS ABS: 296 {cells}/uL (ref 15–500)
Eosinophils Relative: 3.4 %
HEMATOCRIT: 36.8 % (ref 35.0–45.0)
HEMOGLOBIN: 12.3 g/dL (ref 11.7–15.5)
LYMPHS ABS: 2358 {cells}/uL (ref 850–3900)
MCH: 30 pg (ref 27.0–33.0)
MCHC: 33.4 g/dL (ref 32.0–36.0)
MCV: 89.8 fL (ref 80.0–100.0)
MPV: 10.5 fL (ref 7.5–12.5)
Monocytes Relative: 6.2 %
NEUTROS PCT: 62.3 %
Neutro Abs: 5420 cells/uL (ref 1500–7800)
Platelets: 288 10*3/uL (ref 140–400)
RBC: 4.1 10*6/uL (ref 3.80–5.10)
RDW: 13.5 % (ref 11.0–15.0)
Total Lymphocyte: 27.1 %
WBC: 8.7 10*3/uL (ref 3.8–10.8)
WBCMIX: 539 {cells}/uL (ref 200–950)

## 2017-11-23 LAB — BASIC METABOLIC PANEL WITH GFR
BUN: 12 mg/dL (ref 7–25)
CHLORIDE: 105 mmol/L (ref 98–110)
CO2: 27 mmol/L (ref 20–32)
Calcium: 8.7 mg/dL (ref 8.6–10.4)
Creat: 0.76 mg/dL (ref 0.60–0.88)
GFR, Est African American: 83 mL/min/{1.73_m2} (ref >=60)
GFR, Est Non African American: 72 mL/min/{1.73_m2} (ref >=60)
GLUCOSE: 98 mg/dL (ref 65–139)
Potassium: 4 mmol/L (ref 3.5–5.3)
SODIUM: 139 mmol/L (ref 135–146)

## 2017-11-23 LAB — HEMOGLOBIN A1C
Hgb A1c MFr Bld: 5.8 % of total Hgb — ABNORMAL HIGH (ref <5.7)
Mean Plasma Glucose: 120 (calc)
eAG (mmol/L): 6.6 (calc)

## 2017-11-23 LAB — TSH: TSH: 5.87 m[IU]/L — AB (ref 0.40–4.50)

## 2017-11-23 LAB — T4, FREE: Free T4: 1.3 ng/dL (ref 0.8–1.8)

## 2017-11-23 LAB — VITAMIN B12: VITAMIN B 12: 455 pg/mL (ref 200–1100)

## 2017-11-23 LAB — VITAMIN D 25 HYDROXY (VIT D DEFICIENCY, FRACTURES): VIT D 25 HYDROXY: 43 ng/mL (ref 30–100)

## 2017-11-24 ENCOUNTER — Encounter: Payer: Self-pay | Admitting: Family Medicine

## 2017-11-24 ENCOUNTER — Ambulatory Visit: Payer: Medicare PPO | Admitting: Family Medicine

## 2017-11-24 VITALS — BP 125/47 | HR 44 | Temp 98.2°F | Resp 16 | Ht 62.0 in | Wt 149.8 lb

## 2017-11-24 DIAGNOSIS — S62356D Nondisplaced fracture of shaft of fifth metacarpal bone, right hand, subsequent encounter for fracture with routine healing: Secondary | ICD-10-CM

## 2017-11-24 DIAGNOSIS — F339 Major depressive disorder, recurrent, unspecified: Secondary | ICD-10-CM | POA: Diagnosis not present

## 2017-11-24 DIAGNOSIS — R5382 Chronic fatigue, unspecified: Secondary | ICD-10-CM

## 2017-11-24 DIAGNOSIS — E782 Mixed hyperlipidemia: Secondary | ICD-10-CM | POA: Diagnosis not present

## 2017-11-24 DIAGNOSIS — E032 Hypothyroidism due to medicaments and other exogenous substances: Secondary | ICD-10-CM | POA: Diagnosis not present

## 2017-11-24 MED ORDER — SIMVASTATIN 40 MG PO TABS
40.0000 mg | ORAL_TABLET | Freq: Every day | ORAL | 3 refills | Status: DC
Start: 2017-11-24 — End: 2018-02-28

## 2017-11-24 NOTE — Patient Instructions (Addendum)
Thank you for coming to the clinic today.  1. Keep up the good work and enjoy [place] 2. Continue Fluoxetine 40mg  daily instead of adding the 20mg  pill for now, I think it may have been too strong to take 60mg  3. You may continue Vitamin D3 1,000 once daily for supplement 4. B12 was normal, other labs normal including blood sugar 5. Mildly elevated TSH, I would not advise changing thyroid med at this time, we can re-consider this in the future.  Keep wearing wrist splint for now, 1-2 more weeks then try intermittent wear as long as not painful.  Future consider Sleep Study if persistent problem   Please schedule a Follow-up Appointment to: Return in about 3 months (around 02/24/2018) for Fatigue/Sleepiness / Thyroid / Mood/Anxiety.  If you have any other questions or concerns, please feel free to call the clinic or send a message through MyChart. You may also schedule an earlier appointment if necessary.  Additionally, you may be receiving a survey about your experience at our clinic within a few days to 1 week by e-mail or mail. We value your feedback.  Stacey PilarAlexander Jimmey Hengel, DO [facility] [place] [facility]

## 2017-11-24 NOTE — Progress Notes (Signed)
Subjective:    Patient ID: Stacey Duncan, female    DOB: [birth year], 83 y.o.   MRN: [id number]  Stacey Duncan is a 83 y.o. female presenting on 11/24/2017 for Fatigue (lab review)   HPI   FOLLOW-UP Hypothyroidism / Fatigue / Daytime Sleepiness - Last visit with me 11/09/17, for same problem, treated with lowered dose Fluoxetine from 60 to 40mg  (see below) and checked several labs for work-up, see prior notes for background information. - Interval update with reviewed labs from 11/26 overall unremarkable, normal Vit D, B12, chemistry, Cr, glucose, A1c (borderline Pre-DM 5.8), normal CBC, no anemia, had mild elevated TSH 5.87 compared to last 3.71 about 1 month ago, however normal Free T4 - Today patient reports that her fatigue feels significantly improved now. She is not sure what to attribute this to, thinks maybe the lower dose Fluoxetine may have helped. Also seems to have improved regardless. She is primarily now concerned about daytime sleepiness less fatigue, seems to happen occasionally not every day, but she sleeps well not having problem now with insomnia - She has history of fluctuating thyroid hormone for many years ever since initial diagnosis long time ago, states has tried variable doses of levothyroxine has always had difficulty with it - Currently taking Levothyroxine 100mcg daily, also on amiodarone - Denies any sleep apnea episodes, near syncope, syncope, palpitations, chest pain, dyspnea, exertional symptoms  Chronic Bradycardia / AFib - Last seen Cardiology Dr Mariah MillingGollan 10/29, known confirmed bradycardia chronically, had been mostly asymptomatic in past. However this may be contributing to some of her fatigue. - On anticoagulation with Eliquis and anti-arrhythmia with Amiodarone - Additionally last night had elevated BP up to SBP 160-170 few readings but seems odd since she has had normal BP other checks, seems to be intermittent and asymptomatic  FOLLOW-UP R Hand  Fracture, 5th metacarpal - Reports wearing splint for 3 weeks with improvement, only some soreness, no pain. No swelling or other concerns. - Denies numbness tingling weakness  DEPRESSION with ANXIETY, History of chronic recurrent MDD: - Last visit with me 10/24 and 11/13, for same problem, treated with REDUCED dose Fluoxetine from 60 to 40mg  thought side effects grogginess, see prior notes for background information. - Today patient reports feels good, thinks better on 40mg  daily, now less fatigue and fog, improved anxiety and mood, still intermittent sleepiness tired - Continues Fluoxetine 40mg   - See PHQ and GAD - Denies any suicidal or homicidal ideation  Additional social history - Patient is leaving for [place] for >1 month to visit children/grandchildren  Health Maintenance: UTD Flu Shot 10/20/17 UTD PNA vaccine  [facility] 11/09/2017 10/20/2017 07/20/2017  Decreased Interest 1 2 0  Down, Depressed, Hopeless 1 2 0  PHQ - 2 Score 2 4 0  Altered sleeping 3 2 -  Tired, decreased energy 3 3 -  Change in appetite 2 3 -  Feeling bad or failure about yourself  1 3 -  Trouble concentrating 0 2 -  Moving slowly or fidgety/restless 0 1 -  Suicidal thoughts 0 0 -  PHQ-9 Score 11 18 -  Difficult doing work/chores Somewhat difficult Very difficult -    Social History   Tobacco Use  . Smoking status: Never Smoker  . Smokeless tobacco: Never Used  Substance Use Topics  . Alcohol use: Yes    Alcohol/week: 3.0 oz    Types: 5 Glasses of wine per week    Comment: glass of wine daily  .  Drug use: No    Review of Systems Per HPI unless specifically indicated above     Objective:    BP (!) 125/47   Pulse (!) 44   Temp 98.2 F (36.8 C) (Oral)   Resp 16   Ht 5\' 2"  (1.575 m)   Wt 149 lb 12.8 oz (67.9 kg)   BMI 27.40 kg/m   Wt Readings from Last 3 Encounters:  11/24/17 149 lb 12.8 oz (67.9 kg)  11/09/17 149 lb 12.8 oz (67.9 kg)  10/25/17 153 lb 8 oz (69.6  kg)    Physical Exam  Constitutional: She is oriented to person, place, and time. She appears well-developed and well-nourished. No distress.  Well-appearing, comfortable, cooperative  HENT:  Head: Normocephalic and atraumatic.  Mouth/Throat: Oropharynx is clear and moist.  Eyes: Conjunctivae are normal. Right eye exhibits no discharge. Left eye exhibits no discharge.  Neck: Normal range of motion. Neck supple. No thyromegaly present.  Cardiovascular: Normal rate, regular rhythm, normal heart sounds and intact distal pulses.  No murmur heard. No ectopy  Pulmonary/Chest: Effort normal.  Musculoskeletal:  Right Hand/Wrist - in splint, good ROM  Neurological: She is alert and oriented to person, place, and time.  Skin: Skin is warm and dry. No rash noted. She is not diaphoretic. No erythema.  Psychiatric: She has a normal mood and affect. Her behavior is normal.  Well groomed, good eye contact, normal speech and thoughts. Good mood, not anxious appearing.  Nursing note and vitals reviewed.  Results for orders placed or performed in visit on 11/22/17  Hemoglobin A1c  Result Value Ref Range   Hgb A1c MFr Bld 5.8 (H) <5.7 % of total Hgb   Mean Plasma Glucose 120 (calc)   eAG (mmol/L) 6.6 (calc)  Vitamin B12  Result Value Ref Range   Vitamin B-12 455 200 - 1,100 pg/mL  VITAMIN D 25 Hydroxy (Vit-D Deficiency, Fractures)  Result Value Ref Range   Vit D, 25-Hydroxy 43 30 - 100 ng/mL  T4, free  Result Value Ref Range   Free T4 1.3 0.8 - 1.8 ng/dL  TSH  Result Value Ref Range   TSH 5.87 (H) 0.40 - 4.50 mIU/L  CBC with Differential/Platelet  Result Value Ref Range   WBC 8.7 3.8 - 10.8 Thousand/uL   RBC 4.10 3.80 - 5.10 Million/uL   Hemoglobin 12.3 11.7 - 15.5 g/dL   HCT 16.136.8 09.635.0 - 04.545.0 %   MCV 89.8 80.0 - 100.0 fL   MCH 30.0 27.0 - 33.0 pg   MCHC 33.4 32.0 - 36.0 g/dL   RDW 40.913.5 81.111.0 - 91.415.0 %   Platelets 288 140 - 400 Thousand/uL   MPV 10.5 7.5 - 12.5 fL   Neutro Abs 5,420  1,500 - 7,800 cells/uL   Lymphs Abs 2,358 850 - 3,900 cells/uL   WBC mixed population 539 200 - 950 cells/uL   Eosinophils Absolute 296 15 - 500 cells/uL   Basophils Absolute 87 0 - 200 cells/uL   Neutrophils Relative % 62.3 %   Total Lymphocyte 27.1 %   Monocytes Relative 6.2 %   Eosinophils Relative 3.4 %   Basophils Relative 1.0 %  BASIC METABOLIC PANEL WITH GFR  Result Value Ref Range   Glucose, Bld 98 65 - 139 mg/dL   BUN 12 7 - 25 mg/dL   Creat 7.820.76 9.560.60 - 2.130.88 mg/dL   GFR, Est Non African American 72 > OR = 60 mL/min/1.673m2   GFR, Est African American 83 >  OR = 60 mL/min/1.84m2   BUN/Creatinine Ratio NOT APPLICABLE 6 - 22 (calc)   Sodium 139 135 - 146 mmol/L   Potassium 4.0 3.5 - 5.3 mmol/L   Chloride 105 98 - 110 mmol/L   CO2 27 20 - 32 mmol/L   Calcium 8.7 8.6 - 10.4 mg/dL      Assessment & Plan:   Problem List Items Addressed This Visit    Chronic fatigue - Primary    Seems improved now, has waxing and waning with long history of chronic fatigue, most likely attributed to higher dose Fluoxetine and mood/anxiety/stress among other med changes recently. Seems less likely hypothyroidism but still possible - Still concern possible mild symptoms from bradycardia, seems stable and chronic = Other labs unremarkable for etiology  Plan: 1. Continue lower Fluoxetine 40mg  daily 2. Cont Levo 100 - adjust in future if need - re-check 6 months 3. Considered possible sleep study due to sleepiness, but no endorsing symptoms of OSA otherwise, she declines PSG - Future follow-up with Cardiology as planned re-consider bradycardia      Hyperlipidemia    Refill Simvastatin 40mg  nightly, requested from [facility], sent e-script      Relevant Medications   simvastatin (ZOCOR) 40 MG tablet   Hypothyroidism    Mild Elevated TSH from 3.7 to 5.8 but normal free T4, symptoms waxing and waning with fatigue, possible but uncertain if attributed to hypothyroidism - Chronic known fluctuation of  TSH and thyroid dosing  Plan: 1. Agree to continue Levo daily for now - may need to adjust, or future concern amiodarone if affecting her significantly 2. Will re-check TSH and Free T4 in about 6 months, sooner if worsening symptoms      Major depression, recurrent, chronic (HCC)    Remains improved Tolerating lower dose Fluoxetine now 40 instead of 60mg  trial, seemed to have side effects with fatigue on higher dose - See prior note for background - Follow-up as planned 3 months       Other Visit Diagnoses    Closed nondisplaced fracture of shaft of fifth metacarpal bone of right hand with routine healing, subsequent encounter       Improved now asymptomatic, using splint, may continue splint for 1-2 more weeks then PRN use for up to 6 weeks follow-up reviewed      Meds ordered this encounter  Medications  . simvastatin (ZOCOR) 40 MG tablet    Sig: Take 1 tablet (40 mg total) by mouth at bedtime.    Dispense:  90 tablet    Refill:  3    Follow up plan: Return in about 3 months (around 02/24/2018) for Fatigue/Sleepiness / Thyroid / Mood/Anxiety.  Saralyn Pilar, DO [facility] 11/24/2017, 1:56 PM

## 2017-11-24 NOTE — Assessment & Plan Note (Signed)
Remains improved Tolerating lower dose Fluoxetine now 40 instead of 60mg  trial, seemed to have side effects with fatigue on higher dose - See prior note for background - Follow-up as planned 3 months

## 2017-11-24 NOTE — Assessment & Plan Note (Signed)
Mild Elevated TSH from 3.7 to 5.8 but normal free T4, symptoms waxing and waning with fatigue, possible but uncertain if attributed to hypothyroidism - Chronic known fluctuation of TSH and thyroid dosing  Plan: 1. Agree to continue Levo daily for now - may need to adjust, or future concern amiodarone if affecting her significantly 2. Will re-check TSH and Free T4 in about 6 months, sooner if worsening symptoms

## 2017-11-24 NOTE — Assessment & Plan Note (Signed)
[facility]  [facility] sent [facility]script

## 2017-11-24 NOTE — Assessment & Plan Note (Signed)
Seems improved now, has waxing and waning with long history of chronic fatigue, most likely attributed to higher dose Fluoxetine and mood/anxiety/stress among other med changes recently. Seems less likely hypothyroidism but still possible - Still concern possible mild symptoms from bradycardia, seems stable and chronic = Other labs unremarkable for etiology  Plan: 1. Continue lower Fluoxetine 40mg  daily 2. Cont Levo 100 - adjust in future if need - re-check 6 months 3. Considered possible sleep study due to sleepiness, but no endorsing symptoms of OSA otherwise, she declines PSG - Future follow-up with Cardiology as planned re-consider bradycardia

## 2017-11-30 ENCOUNTER — Other Ambulatory Visit: Payer: Medicare PPO

## 2017-12-07 ENCOUNTER — Ambulatory Visit: Payer: Medicare PPO | Admitting: Family Medicine

## 2018-01-05 ENCOUNTER — Telehealth: Payer: Self-pay | Admitting: Cardiovascular Disease

## 2018-01-05 DIAGNOSIS — I4891 Unspecified atrial fibrillation: Secondary | ICD-10-CM

## 2018-01-05 MED ORDER — AMIODARONE HCL 200 MG PO TABS: 200.0000 mg | ORAL_TABLET | Freq: Every day | ORAL | 3 refills | Status: DC

## 2018-01-05 MED ORDER — METOPROLOL TARTRATE 25 MG PO TABS
ORAL_TABLET | ORAL | 3 refills | Status: DC
Start: 2018-01-05 — End: 2019-01-16

## 2018-01-05 NOTE — Telephone Encounter (Signed)
°*  STAT* If patient is at the pharmacy, call can be transferred to refill team.   1. Which medications need to be refilled? (please list name of each medication and dose if known)  Amiodarone  Metoprolol   2. Which pharmacy/location (including street and city if local pharmacy) is medication to be sent to? [facility]   3. Do they need a 30 day or 90 day supply? 90 day

## 2018-01-06 ENCOUNTER — Other Ambulatory Visit: Payer: Self-pay | Admitting: *Deleted

## 2018-01-11 ENCOUNTER — Telehealth: Payer: Self-pay | Admitting: Cardiovascular Disease

## 2018-01-11 NOTE — Telephone Encounter (Signed)
Pt states that [facility] has questions regarding Amiodarone. Please call [facility] at [phone]

## 2018-01-11 NOTE — Telephone Encounter (Signed)
Spoke with patient and reviewed that I tried calling number provided and was unable to reach anyone to see what their question may be. She did not have the reference number and advised that I would try another number to see if I could get some information. She verbalized understanding and was very appreciative for the efforts to get this taken care of.

## 2018-01-11 NOTE — Telephone Encounter (Signed)
They were just wanting to make sure we were aware of interaction amiodarone and simvastatin. Confirmed that physician is aware and confirmation given to proceed with filling medications. They verbalized understanding with no further questions at this time.

## 2018-01-17 ENCOUNTER — Telehealth: Payer: Self-pay | Admitting: Cardiovascular Disease

## 2018-01-17 NOTE — Telephone Encounter (Signed)
Patient calling for advise - no details  Please call to discuss

## 2018-01-17 NOTE — Telephone Encounter (Signed)
Would recommend she talk with PMD Hx of chronic fatigue and depression, stress at home She has chronic bradycardia for years with no sx Less likely medications

## 2018-01-17 NOTE — Telephone Encounter (Signed)
Spoke with patient and she reports being fatigued and by 11 am having to return to bed. She states that she has just not felt well with some lightheaded episodes and just not sure what is making her feel bad. She reports that she is still on current medications and no changes have been made. She reports that her heart rates are still low in the 40's but nothing else has changed. Advised that I would make Dr. Mariah MillingGollan aware of low heart rates and she will also check with PCP as well. She verbalized understanding of our conversation, agreement with plan, and had no further questions at this time.

## 2018-01-18 ENCOUNTER — Ambulatory Visit: Payer: Medicare PPO | Admitting: Family Medicine

## 2018-01-18 ENCOUNTER — Encounter: Payer: Self-pay | Admitting: Family Medicine

## 2018-01-18 VITALS — BP 150/60 | HR 46 | Temp 98.5°F | Resp 16 | Ht 62.0 in | Wt 151.0 lb

## 2018-01-18 DIAGNOSIS — F339 Major depressive disorder, recurrent, unspecified: Secondary | ICD-10-CM

## 2018-01-18 DIAGNOSIS — I4891 Unspecified atrial fibrillation: Secondary | ICD-10-CM

## 2018-01-18 DIAGNOSIS — Z7901 Long term (current) use of anticoagulants: Secondary | ICD-10-CM

## 2018-01-18 DIAGNOSIS — E032 Hypothyroidism due to medicaments and other exogenous substances: Secondary | ICD-10-CM | POA: Diagnosis not present

## 2018-01-18 LAB — CBC WITH DIFFERENTIAL/PLATELET
BASOS ABS: 77 {cells}/uL (ref 0–200)
Basophils Relative: 0.9 %
EOS ABS: 213 {cells}/uL (ref 15–500)
Eosinophils Relative: 2.5 %
HCT: 39 % (ref 35.0–45.0)
Hemoglobin: 13.2 g/dL (ref 11.7–15.5)
Lymphs Abs: 2414 cells/uL (ref 850–3900)
MCH: 30.3 pg (ref 27.0–33.0)
MCHC: 33.8 g/dL (ref 32.0–36.0)
MCV: 89.4 fL (ref 80.0–100.0)
MONOS PCT: 7.7 %
MPV: 10.5 fL (ref 7.5–12.5)
Neutro Abs: 5143 cells/uL (ref 1500–7800)
Neutrophils Relative %: 60.5 %
PLATELETS: 272 10*3/uL (ref 140–400)
RBC: 4.36 10*6/uL (ref 3.80–5.10)
RDW: 13.8 % (ref 11.0–15.0)
Total Lymphocyte: 28.4 %
WBC mixed population: 655 cells/uL (ref 200–950)
WBC: 8.5 10*3/uL (ref 3.8–10.8)

## 2018-01-18 LAB — T4, FREE: FREE T4: 1.5 ng/dL (ref 0.8–1.8)

## 2018-01-18 LAB — BASIC METABOLIC PANEL WITH GFR
BUN/Creatinine Ratio: 14 (calc) (ref 6–22)
BUN: 14 mg/dL (ref 7–25)
CHLORIDE: 103 mmol/L (ref 98–110)
CO2: 29 mmol/L (ref 20–32)
Calcium: 9.2 mg/dL (ref 8.6–10.4)
Creat: 1.02 mg/dL — ABNORMAL HIGH (ref 0.60–0.88)
GFR, EST AFRICAN AMERICAN: 58 mL/min/{1.73_m2} — AB (ref >=60)
GFR, EST NON AFRICAN AMERICAN: 50 mL/min/{1.73_m2} — AB (ref >=60)
Glucose, Bld: 89 mg/dL (ref 65–139)
POTASSIUM: 4.4 mmol/L (ref 3.5–5.3)
Sodium: 137 mmol/L (ref 135–146)

## 2018-01-18 LAB — TSH: TSH: 5.45 mIU/L — ABNORMAL HIGH (ref 0.40–4.50)

## 2018-01-18 NOTE — Telephone Encounter (Signed)
Spoke with patient and reviewed Dr. Windell HummingbirdGollan's recommendations that she check with her PCP regarding her continued symptoms. She verbalized understanding with no further questions at this time.

## 2018-01-18 NOTE — Patient Instructions (Addendum)
Thank you for coming to the office today.  1. We will check blood work today, I think may be due to low thyroid hormone  2. Try ice and compression for finger, take tylenol as needed  3. If any significant worsening or new symptoms concern for stroke numbness weakness, near passing out chest pain or shortness of breath worsening, then go to hospital ED for evaluation  May schedule with Dr Mariah MillingGollan if still not improved within next few weeks to month  Please schedule a Follow-up Appointment to: Return if symptoms worsen or fail to improve, for keep apt as scheduled, return sooner if worsening fatigue.    If you have any other questions or concerns, please feel free to call the office or send a message through MyChart. You may also schedule an earlier appointment if necessary.  Additionally, you may be receiving a survey about your experience at our office within a few days to 1 week by e-mail or mail. We value your feedback.  Saralyn PilarAlexander Chaddrick Brue, DO [facility] [place]

## 2018-01-18 NOTE — Assessment & Plan Note (Signed)
Stable without evidence of AFib today on exam With bradycardia (chronic), without RVR On rhythm control Amio and rate control BB On anticoagulation Eliquis  Seems less likely for cause of current constellation of symptoms Reassurance, may return to Cardiology in future if unable to determine other etiology

## 2018-01-18 NOTE — Assessment & Plan Note (Signed)
Possible cause of her constellation of symptoms Mild Elevated TSH recently 3 to 5.8 to 5.4, despite normal Free T4 Has some chronic fatigue among other symptoms, difficult to determine if new cause or same problem but recent worsening  Plan: 1. For now continue Levothyroxine daily 2. Check TSH and Free T4 today

## 2018-01-18 NOTE — Progress Notes (Signed)
Subjective:    Patient ID: Stacey Duncan, female    DOB: 1931-09-19, 84 y.o.   MRN: [id number]  Stacey Duncan is a 84 y.o. female presenting on 01/18/2018 for Fatigue (onset 2 weeks)  Patient presents for a same day appointment.  HPI   FATIGUE / DIZZY / LIGHTHEADEDNESS / Hypothyroidism / MDD / PAF / Bradycardia Reports symptoms started about 2 weeks ago gradual onset intermittent episodes worsening lightheaded dizzy and off balance, symptoms worse in morning than at night. - Followed by Cardiology [facility] Dr Mariah Milling, last seen 09/2017, has PAF with prior episode requiring hospitalization with RVR, not quite same symptoms, also has chronic bradycardia had been relatively asymptomatic in past from this. She called Cardiology yesterday with symptoms advised to get checked out at our office - No new medications, she has had some fatigue on Metoprolol in past, but this was reduced >2 yr ago from 25 to 12.5mg  BID - Last TSH >5.4, had been elevated with nml free T4 - Admits some R ear discomfort - Admits occasional nausea - Admits chronic night sweats without change - Admits some possible exertional dyspnea rare episode after housework, improved with rest - Denies worsening depression or anxiety recent stressors (just got back from vacation in [place] visiting family) - Denies any headache or pain, vomiting or abdominal pain, chest pain pressure or tightness, room spinning or vertigo, fall, near or syncopal episode  Additional complaint Right finger PIP swollen and painful, did not injure it, has history of OA/DJD in hands, did have history of R hand 5th metacarpal fracture that healed few moths ago   Depression screen [facility] 2/9 11/09/2017 10/20/2017 07/20/2017  Decreased Interest 1 2 0  Down, Depressed, Hopeless 1 2 0  PHQ - 2 Score 2 4 0  Altered sleeping 3 2 -  Tired, decreased energy 3 3 -  Change in appetite 2 3 -  Feeling bad or failure about yourself  1 3 -  Trouble concentrating  0 2 -  Moving slowly or fidgety/restless 0 1 -  Suicidal thoughts 0 0 -  PHQ-9 Score 11 18 -  Difficult doing work/chores Somewhat difficult Very difficult -    Social History   Tobacco Use  . Smoking status: Never Smoker  . Smokeless tobacco: Never Used  Substance Use Topics  . Alcohol use: Yes    Alcohol/week: 3.0 oz    Types: 5 Glasses of wine per week    Comment: glass of wine daily  . Drug use: No    Review of Systems Per HPI unless specifically indicated above     Objective:    BP (!) 150/60 Comment: manual  Pulse (!) 46   Temp 98.5 F (36.9 C) (Oral)   Resp 16   Ht 5\' 2"  (1.575 m)   Wt 151 lb (68.5 kg)   BMI 27.62 kg/m   Wt Readings from Last 3 Encounters:  01/18/18 151 lb (68.5 kg)  11/24/17 149 lb 12.8 oz (67.9 kg)  11/09/17 149 lb 12.8 oz (67.9 kg)    Physical Exam  Constitutional: She is oriented to person, place, and time. She appears well-developed and well-nourished. No distress.  Well-appearing, comfortable, cooperative  HENT:  Head: Normocephalic and atraumatic.  Mouth/Throat: Oropharynx is clear and moist.  Eyes: Conjunctivae are normal. Right eye exhibits no discharge. Left eye exhibits no discharge.  Neck: Normal range of motion. Neck supple. No thyromegaly present.  Cardiovascular: Regular rhythm, normal heart sounds and intact distal pulses.  No murmur heard. Bradycardic No ectopy or irregular rhythm heard on auscultation  Pulmonary/Chest: Effort normal and breath sounds normal. No respiratory distress. She has no wheezes. She has no rales.  Musculoskeletal: Normal range of motion. She exhibits no edema.  Right little finger PIP slightly swollen and bulky mild tender minimal erythema, not extending. No other hand deformity, has thin fingers with some bulky appearance to DIP joints various fingers, some enlarged MCPs  Lymphadenopathy:    She has no cervical adenopathy.  Neurological: She is alert and oriented to person, place, and time. No  cranial nerve deficit.  Distal sensation to light touch intact both extremities  Skin: Skin is warm and dry. No rash noted. She is not diaphoretic. No erythema.  Psychiatric: She has a normal mood and affect. Her behavior is normal.  Well groomed, good eye contact, normal speech and thoughts  Nursing note and vitals reviewed.  Results for orders placed or performed in visit on 01/18/18  TSH  Result Value Ref Range   TSH 5.45 (H) 0.40 - 4.50 mIU/L  T4, free  Result Value Ref Range   Free T4 1.5 0.8 - 1.8 ng/dL  BASIC METABOLIC PANEL WITH GFR  Result Value Ref Range   Glucose, Bld 89 65 - 139 mg/dL   BUN 14 7 - 25 mg/dL   Creat 1.611.02 (H) 0.960.60 - 0.88 mg/dL   GFR, Est Non African American 50 (L) > OR = 60 mL/min/1.4173m2   GFR, Est African American 58 (L) > OR = 60 mL/min/1.5273m2   BUN/Creatinine Ratio 14 6 - 22 (calc)   Sodium 137 135 - 146 mmol/L   Potassium 4.4 3.5 - 5.3 mmol/L   Chloride 103 98 - 110 mmol/L   CO2 29 20 - 32 mmol/L   Calcium 9.2 8.6 - 10.4 mg/dL  CBC with Differential/Platelet  Result Value Ref Range   WBC 8.5 3.8 - 10.8 Thousand/uL   RBC 4.36 3.80 - 5.10 Million/uL   Hemoglobin 13.2 11.7 - 15.5 g/dL   HCT 04.539.0 40.935.0 - 81.145.0 %   MCV 89.4 80.0 - 100.0 fL   MCH 30.3 27.0 - 33.0 pg   MCHC 33.8 32.0 - 36.0 g/dL   RDW 91.413.8 78.211.0 - 95.615.0 %   Platelets 272 140 - 400 Thousand/uL   MPV 10.5 7.5 - 12.5 fL   Neutro Abs 5,143 1,500 - 7,800 cells/uL   Lymphs Abs 2,414 850 - 3,900 cells/uL   WBC mixed population 655 200 - 950 cells/uL   Eosinophils Absolute 213 15 - 500 cells/uL   Basophils Absolute 77 0 - 200 cells/uL   Neutrophils Relative % 60.5 %   Total Lymphocyte 28.4 %   Monocytes Relative 7.7 %   Eosinophils Relative 2.5 %   Basophils Relative 0.9 %      Assessment & Plan:   Problem List Items Addressed This Visit    Atrial fibrillation with RVR (HCC)    Stable without evidence of AFib today on exam With bradycardia (chronic), without RVR On rhythm control  Amio and rate control BB On anticoagulation Eliquis  Seems less likely for cause of current constellation of symptoms Reassurance, may return to Cardiology in future if unable to determine other etiology      Relevant Orders   BASIC METABOLIC PANEL WITH GFR (Completed)   CBC with Differential/Platelet (Completed)   Hypothyroidism - Primary    Possible cause of her constellation of symptoms Mild Elevated TSH recently 3 to 5.8 to 5.4,  despite normal Free T4 Has some chronic fatigue among other symptoms, difficult to determine if new cause or same problem but recent worsening  Plan: 1. For now continue Levothyroxine daily 2. Check TSH and Free T4 today      Relevant Orders   TSH (Completed)   T4, free (Completed)   Major depression, recurrent, chronic (HCC)    Still seems improved, doesn't seem to be contributing to current symptoms Seems more exertional fatigue at times rather than generalized malaise Back on lower dose Fluoxetine 40mg  daily, had been doing better without side effects        Other Visit Diagnoses    Chronic anticoagulation     Check CBC while on anticoag for other causes of fatigue   Relevant Orders   CBC with Differential/Platelet (Completed)      #Acute on Chronic Fatigue Contributing factor, now seems more exertional among other symptoms, possibly with viral or other illness Consider close follow-up and possibly CXR (despite normal respiratory status in office today) if not improved  #Bradycardia Less likely to be etiology If not improved reconsider  #Dizziness No evidence of vertigo, near syncope Improve hydration Check labs today    No orders of the defined types were placed in this encounter.    Follow up plan: Return if symptoms worsen or fail to improve, for keep apt as scheduled, return sooner if worsening fatigue.  Saralyn Pilar, DO [facility] 01/19/2018, 12:04 AM

## 2018-01-19 ENCOUNTER — Telehealth: Payer: Self-pay | Admitting: Family Medicine

## 2018-01-19 NOTE — Telephone Encounter (Signed)
Called patient to review labs, see result note for further information from 01/18/18 office visit.  Only abnormal labs was elevated Cr 1.02 compared to last Cr 0.76 in 10/2017, and reduced GFR, as well as a still mildly elevated TSH 5.4 (improved slightly) and normal Free T4. Otherwise normal Hgb and WBC on CBC.  Spoke to [place] and reviewed this, suspect symptoms may be related to hydration, she admits to not drinking as much water maybe and will work on improving this. I also advised her to HOLD Losartan 50mg  (takes half of 100mg  tab) and see if this will help. Did not change Levothyroxine at this time, but advised patient to call us Friday 1/25 if not improved with hydration then we would consider new rx Levo 112mcg daily slight increase to see if that helped.  Lastly, advised that other differential dx item includes possible underlying PNA, with more insidious onset since still not demonstrated significant respiratory symptoms, she may return for more broad work up including chest x-ray and possibly UA if still looking for causes of symptoms.   Results for orders placed or performed in visit on 01/18/18 (from the past 24 hour(s))  TSH     Status: Abnormal   Collection Time: 01/18/18 11:53 AM  Result Value Ref Range   TSH 5.45 (H) 0.40 - 4.50 mIU/L  T4, free     Status: None   Collection Time: 01/18/18 11:53 AM  Result Value Ref Range   Free T4 1.5 0.8 - 1.8 ng/dL  BASIC METABOLIC PANEL WITH GFR     Status: Abnormal   Collection Time: 01/18/18 11:53 AM  Result Value Ref Range   Glucose, Bld 89 65 - 139 mg/dL   BUN 14 7 - 25 mg/dL   Creat 8.411.02 (H) 3.240.60 - 0.88 mg/dL   GFR, Est Non African American 50 (L) > OR = 60 mL/min/1.7773m2   GFR, Est African American 58 (L) > OR = 60 mL/min/1.2973m2   BUN/Creatinine Ratio 14 6 - 22 (calc)   Sodium 137 135 - 146 mmol/L   Potassium 4.4 3.5 - 5.3 mmol/L   Chloride 103 98 - 110 mmol/L   CO2 29 20 - 32 mmol/L   Calcium 9.2 8.6 - 10.4 mg/dL  CBC with  Differential/Platelet     Status: None   Collection Time: 01/18/18 11:53 AM  Result Value Ref Range   WBC 8.5 3.8 - 10.8 Thousand/uL   RBC 4.36 3.80 - 5.10 Million/uL   Hemoglobin 13.2 11.7 - 15.5 g/dL   HCT 40.139.0 02.735.0 - 25.345.0 %   MCV 89.4 80.0 - 100.0 fL   MCH 30.3 27.0 - 33.0 pg   MCHC 33.8 32.0 - 36.0 g/dL   RDW 66.413.8 40.311.0 - 47.415.0 %   Platelets 272 140 - 400 Thousand/uL   MPV 10.5 7.5 - 12.5 fL   Neutro Abs 5,143 1,500 - 7,800 cells/uL   Lymphs Abs 2,414 850 - 3,900 cells/uL   WBC mixed population 655 200 - 950 cells/uL   Eosinophils Absolute 213 15 - 500 cells/uL   Basophils Absolute 77 0 - 200 cells/uL   Neutrophils Relative % 60.5 %   Total Lymphocyte 28.4 %   Monocytes Relative 7.7 %   Eosinophils Relative 2.5 %   Basophils Relative 0.9 %   Saralyn PilarAlexander Gillian Meeuwsen, DO [facility] 01/19/2018, 8:27 AM

## 2018-01-19 NOTE — Assessment & Plan Note (Signed)
Still seems improved, doesn't seem to be contributing to current symptoms Seems more exertional fatigue at times rather than generalized malaise Back on lower dose Fluoxetine 40mg  daily, had been doing better without side effects

## 2018-02-17 ENCOUNTER — Telehealth: Payer: Self-pay | Admitting: Cardiovascular Disease

## 2018-02-17 MED ORDER — LOSARTAN POTASSIUM 100 MG PO TABS: 50.0000 mg | ORAL_TABLET | Freq: Every day | ORAL | 3 refills | Status: DC

## 2018-02-17 NOTE — Telephone Encounter (Signed)
Reviewed Dr. Windell HummingbirdGollan's recommendations with patient and instructed her to stop the amiodarone and start her losartan at 50 mg once daily for one week and if SBP is greater than 140 then to increase losartan to 100 mg once daily. Instructed her to please call us and let us know which dosage works bets for her and to make sure she remains on the Eliquis. She verbalized understanding of our conversation, plan of care, and had no further questions at this time. She was agreeable to call back and let us know which dose of losartan works best.

## 2018-02-17 NOTE — Telephone Encounter (Signed)
She can hold the amiodarone See how ago, see if her rhythm holds Would stay on the anticoagulation, Eliquis  For blood pressure need to restart at least 50 losartan if not 100 Even on the losartan  50 blood pressure was running high Would probably start on 50 for 1 week and if blood pressure continues to run high numbers greater than 140 on a regular basis would go to 100 mg

## 2018-02-17 NOTE — Telephone Encounter (Signed)
Pt states the Amiodarone , she thinks, is affecting her vision. She states that this past week, she had been very nauseated and cant see clearly. Please call to discuss.

## 2018-02-17 NOTE — Telephone Encounter (Signed)
I spoke with the patient. She is currently on amiodarone 200 mg once daily. Amiodarone was started around 10/15/17 during her hospitalization for a-fib w/ RVR.  She reports visual disturbance (blurry vision) that has started recently- she reports that she likes to do coloring books and she will be able to do this about 15-20 minutes and her eyes start to blur. She also reports intermittent nausea throughout the day (this has been going on about 2-3 weeks). Her only other complaint is occasional balance issues.   The patient states she has not had to wear glasses/ contacts since she had her cataracts removed about 4 years ago.  She does not have an eye appointment pending. She recently saw her PCP- she is hypothyroid (TSH- 5.45/ free T4- 1.5) - she is currently on thyroid replacement.  She does track her BP/ HR at home every night and reports HR's maintain in the 40-50's & her SBP is typically not less than 160.  I reviewed the patient's medication list with her and she confirms: 1) amiodarone 200 mg once daily 2) metoprolol tartrate 25 mg- 1/2 tablet BID  Losartan 100 mg once daily was on her list, but she states she is not taking this as her PCP cut it in half, then stopped it for an unknown reason to her- I have removed this from her medication list. I asked if they replaced losartan with anything else, and she confirmed they did, but then took her off of it.   I advised her that I will forward a message to Dr. Mariah MillingGollan to review and we will call her back with his recommendations, but to continue her current medications until she hears back from us.  She is agreeable.

## 2018-02-24 ENCOUNTER — Ambulatory Visit: Payer: Medicare PPO | Admitting: Family Medicine

## 2018-02-24 ENCOUNTER — Encounter: Payer: Self-pay | Admitting: Family Medicine

## 2018-02-24 ENCOUNTER — Other Ambulatory Visit: Payer: Self-pay | Admitting: Family Medicine

## 2018-02-24 VITALS — BP 144/61 | HR 44 | Temp 98.8°F | Resp 16 | Ht 62.0 in | Wt 150.0 lb

## 2018-02-24 DIAGNOSIS — F339 Major depressive disorder, recurrent, unspecified: Secondary | ICD-10-CM

## 2018-02-24 DIAGNOSIS — R5383 Other fatigue: Secondary | ICD-10-CM

## 2018-02-24 DIAGNOSIS — R2681 Unsteadiness on feet: Secondary | ICD-10-CM

## 2018-02-24 DIAGNOSIS — R7303 Prediabetes: Secondary | ICD-10-CM

## 2018-02-24 DIAGNOSIS — E782 Mixed hyperlipidemia: Secondary | ICD-10-CM

## 2018-02-24 DIAGNOSIS — K219 Gastro-esophageal reflux disease without esophagitis: Secondary | ICD-10-CM

## 2018-02-24 DIAGNOSIS — E032 Hypothyroidism due to medicaments and other exogenous substances: Secondary | ICD-10-CM | POA: Diagnosis not present

## 2018-02-24 DIAGNOSIS — Z0000 Encounter for general adult medical examination without abnormal findings: Secondary | ICD-10-CM

## 2018-02-24 DIAGNOSIS — I10 Essential (primary) hypertension: Secondary | ICD-10-CM | POA: Diagnosis not present

## 2018-02-24 DIAGNOSIS — R5382 Chronic fatigue, unspecified: Secondary | ICD-10-CM

## 2018-02-24 DIAGNOSIS — Z7901 Long term (current) use of anticoagulants: Secondary | ICD-10-CM

## 2018-02-24 DIAGNOSIS — I4891 Unspecified atrial fibrillation: Secondary | ICD-10-CM

## 2018-02-24 NOTE — Progress Notes (Signed)
Subjective:    Patient ID: Stacey Duncan, female    DOB: [birth year], 84 y.o.   MRN: [id number]  Stacey Duncan is a 85 y.o. female presenting on 02/24/2018 for Fatigue   HPI   FOLLOW-UP FATIGUE / Hypothyroidism / Depression / PAF / Bradycardia - Last visit with me 01/18/18, for same problem, treated with labwork showed mild elevated TSH but normal Free T4 otherwise with elevated Cr and other unremarkable, see prior notes for background information. - Interval update with contacted Cardiology, they were monitoring BP closely, and adjusted Losartan - Today patient reports now she is back on full dose Losartan, BP seems improving had some elevated readings recently. Has been DC'd off Amiodarone now for 1 month approx seems to have been causing side effects - Concern with thyroid on Amio - Still feels same fatigue and tired, seems intermittent now not everyday somedays better - No palpitations Reports vision has been gradually worsening. History of cataract surgery >4 years ago. She had problems with blurry vision and feeling some nauseas, also felt similar problem with coloring. - Does not feel secure walking, feels like she is "falling forward" - Cardiology was not convinced Bradycardia symptoms - Regarding mood she follows with psychologist/therapist through hospice on weekends with some improvement, no psychiatry. In past has failed Sertraline and Wellbutrin, still taking Fluoxetine - Taking Simvastatin unsure if myalgias or weakness from that - Requesting gait evaluation - Denies any significant worsening mood or depression but does admit to feeling more frustrated with not being able to do what she needs to do and is less active, not used to this - Denies any headache or pain, vomiting or abdominal pain, chest pain pressure or tightness, room spinning or vertigo, fall, near or syncopal episode  Depression screen [facility] 2/9 02/24/2018 11/09/2017 10/20/2017  Decreased Interest 1 1 2     Down, Depressed, Hopeless 0 1 2  PHQ - 2 Score 1 2 4   Altered sleeping 3 3 2   Tired, decreased energy 2 3 3   Change in appetite 0 2 3  Feeling bad or failure about yourself  0 1 3  Trouble concentrating 0 0 2  Moving slowly or fidgety/restless 1 0 1  Suicidal thoughts 0 0 0  PHQ-9 Score 7 11 18   Difficult doing work/chores Somewhat difficult Somewhat difficult Very difficult    Social History   Tobacco Use  . Smoking status: Never Smoker  . Smokeless tobacco: Never Used  Substance Use Topics  . Alcohol use: Yes    Alcohol/week: 3.0 oz    Types: 5 Glasses of wine per week    Comment: glass of wine daily  . Drug use: No    Review of Systems Per HPI unless specifically indicated above     Objective:    BP (!) 144/61   Pulse (!) 44   Temp 98.8 F (37.1 C)   Resp 16   Ht 5\' 2"  (1.575 m)   Wt 150 lb (68 kg)   SpO2 96%   BMI 27.44 kg/m   Wt Readings from Last 3 Encounters:  02/24/18 150 lb (68 kg)  01/18/18 151 lb (68.5 kg)  11/24/17 149 lb 12.8 oz (67.9 kg)    Physical Exam  Constitutional: She is oriented to person, place, and time. She appears well-developed and well-nourished. No distress.  Well-appearing, comfortable, cooperative  HENT:  Head: Normocephalic and atraumatic.  Mouth/Throat: Oropharynx is clear and moist.  Eyes: Conjunctivae are normal. Right eye exhibits no  discharge. Left eye exhibits no discharge.  Neck: Normal range of motion. Neck supple. No thyromegaly present.  Cardiovascular: Regular rhythm, normal heart sounds and intact distal pulses.  No murmur heard. Bradycardic No ectopy or irregular rhythm heard on auscultation  Pulmonary/Chest: Effort normal and breath sounds normal. No respiratory distress. She has no wheezes. She has no rales.  Musculoskeletal: Normal range of motion. She exhibits no edema.  Lymphadenopathy:    She has no cervical adenopathy.  Neurological: She is alert and oriented to person, place, and time. No cranial  nerve deficit.  Distal sensation to light touch intact both extremities  Skin: Skin is warm and dry. No rash noted. She is not diaphoretic. No erythema.  Psychiatric: She has a normal mood and affect. Her behavior is normal.  Well groomed, good eye contact, normal speech and thoughts  Nursing note and vitals reviewed.  Results for orders placed or performed in visit on 01/18/18  TSH  Result Value Ref Range   TSH 5.45 (H) 0.40 - 4.50 mIU/L  T4, free  Result Value Ref Range   Free T4 1.5 0.8 - 1.8 ng/dL  BASIC METABOLIC PANEL WITH GFR  Result Value Ref Range   Glucose, Bld 89 65 - 139 mg/dL   BUN 14 7 - 25 mg/dL   Creat 6.96 (H) 2.95 - 0.88 mg/dL   GFR, Est Non African American 50 (L) > OR = 60 mL/min/1.49m2   GFR, Est African American 58 (L) > OR = 60 mL/min/1.77m2   BUN/Creatinine Ratio 14 6 - 22 (calc)   Sodium 137 135 - 146 mmol/L   Potassium 4.4 3.5 - 5.3 mmol/L   Chloride 103 98 - 110 mmol/L   CO2 29 20 - 32 mmol/L   Calcium 9.2 8.6 - 10.4 mg/dL  CBC with Differential/Platelet  Result Value Ref Range   WBC 8.5 3.8 - 10.8 Thousand/uL   RBC 4.36 3.80 - 5.10 Million/uL   Hemoglobin 13.2 11.7 - 15.5 g/dL   HCT 28.4 13.2 - 44.0 %   MCV 89.4 80.0 - 100.0 fL   MCH 30.3 27.0 - 33.0 pg   MCHC 33.8 32.0 - 36.0 g/dL   RDW 10.2 72.5 - 36.6 %   Platelets 272 140 - 400 Thousand/uL   MPV 10.5 7.5 - 12.5 fL   Neutro Abs 5,143 1,500 - 7,800 cells/uL   Lymphs Abs 2,414 850 - 3,900 cells/uL   WBC mixed population 655 200 - 950 cells/uL   Eosinophils Absolute 213 15 - 500 cells/uL   Basophils Absolute 77 0 - 200 cells/uL   Neutrophils Relative % 60.5 %   Total Lymphocyte 28.4 %   Monocytes Relative 7.7 %   Eosinophils Relative 2.5 %   Basophils Relative 0.9 %      Assessment & Plan:   Problem List Items Addressed This Visit    Essential hypertension - Primary   Hypothyroidism   Major depression, recurrent, chronic (HCC)    Other Visit Diagnoses    Fatigue, unspecified type        Unsteady gait       Relevant Orders   Ambulatory referral to Physical Therapy      No orders of the defined types were placed in this encounter.  Clinically with persistent fatigue, seems improved now intermittent. Uncertain exactly etiology seems multifactorial with medication side effects w/ amiodarone, anti-HTN meds now on lower metop, and off amio x 1 month. Question if statin is causing some fatigue/myalgia etc. Seems  mood is less likely factor but she is getting frustrated with her physical symptoms. Already receiving therapy and on SSRI limited other options at this time - Followed by Cardiology, thought that bradycardia is less likely cause  Plan HOLD Simvastatin for 1-2 week trial to see if improve symptoms Remain off Amio Re-check TSH/Free T4 for thyroid as recent had mild elevated TSH - in next 3 months Remain on low dose Metop Monitor BP Unsteady gait without focal neuro deficits - referral to [facility] for gait eval  Orders Placed This Encounter  Procedures  . Ambulatory referral to Physical Therapy    Referral Priority:   Routine    Referral Type:   Physical Medicine    Referral Reason:   Specialty Services Required    Requested Specialty:   Physical Therapy    Number of Visits Requested:   1     Follow up plan: Return in about 3 months (around 05/24/2018) for Annual Physical.  Future labs ordered for 05/24/2018  Saralyn PilarAlexander Carine Nordgren, DO [facility] 02/24/2018, 6:47 PM

## 2018-02-24 NOTE — Patient Instructions (Addendum)
Thank you for coming to the office today.  1.  It will still take time for the Amiodarone to leave your system as discussed  Try to HOLD Simvastatin med for 1-2 weeks to see if this will help reduce your fatigue, this can be a common side effect on this med, let me know how you do.   Check in with Dr Mariah MillingGollan as planned  Ordered outpatient rehab for physical therapy gait and balance - check with cost and coverage when this is scheduled  DUE for FASTING BLOOD WORK (no food or drink after midnight before the lab appointment, only water or coffee without cream/sugar on the morning of)  SCHEDULE "Lab Only" visit in the morning at the clinic for lab draw in 3 MONTHS   - Make sure Lab Only appointment is at about 1 week before your next appointment, so that results will be available  For Lab Results, once available within 2-3 days of blood draw, you can can log in to MyChart online to view your results and a brief explanation. Also, we can discuss results at next follow-up visit.   Please schedule a Follow-up Appointment to: Return in about 3 months (around 05/24/2018) for Annual Physical.   If you have any other questions or concerns, please feel free to call the office or send a message through MyChart. You may also schedule an earlier appointment if necessary.  Additionally, you may be receiving a survey about your experience at our office within a few days to 1 week by e-mail or mail. We value your feedback.  Stacey PilarAlexander Avett Reineck, DO [facility]

## 2018-02-25 NOTE — Telephone Encounter (Signed)
To Dr. Gollan to review.  

## 2018-02-25 NOTE — Telephone Encounter (Signed)
Pt is calling to report that her BP all last week except Wednesday Thursday was 160 or above, Wed and Thurs was 136 and 134.  Sunday -152/57.  Monday- 16/66 Wednesday- 136/57 Thursday -134/119  States she has not taken it today  States she went to her PCP  And he took her off of Simvastatin, yesterday.

## 2018-02-27 NOTE — Telephone Encounter (Signed)
Would continue to monitor for now Can we make sure we have an updated med list  thx TG

## 2018-02-28 ENCOUNTER — Telehealth: Payer: Self-pay | Admitting: Cardiovascular Disease

## 2018-02-28 NOTE — Telephone Encounter (Signed)
Pt states last night at 10:15 197(not sure the bottom #), 15 min later is was 191/75, 160/71, 170/74,157/73. Denies any other symptoms last night. States this a.m. She is dizzy and nauseated . She states she does not "feel good" . BP this morning is 164/66.

## 2018-02-28 NOTE — Telephone Encounter (Signed)
Spoke with patient and confirmed all of her medications. Medication list updated in chart and reviewed dosages and frequency. She reports taking losartan 100 mg once daily and metoprolol 25 mg 1/2 tablet twice daily. She reports that her blood pressures remain elevated and heart rate has been 47 at her last check. Reviewed that I would route this over to Dr. Mariah MillingGollan for his review and recommendations. Reviewed all prescribed medications that she is taking and updated in Epic. Advised that I would give her a call back with his recommendations and she verbalized understanding with no further questions at this time.

## 2018-02-28 NOTE — Telephone Encounter (Signed)
See other telephone note. Updated medication list with patient and note routed to Dr. Mariah MillingGollan.

## 2018-03-01 ENCOUNTER — Encounter: Payer: Self-pay | Admitting: Cardiovascular Disease

## 2018-03-01 ENCOUNTER — Ambulatory Visit: Payer: Medicare PPO | Admitting: Cardiovascular Disease

## 2018-03-01 VITALS — BP 140/64 | HR 47 | Ht 63.0 in | Wt 148.0 lb

## 2018-03-01 DIAGNOSIS — I4891 Unspecified atrial fibrillation: Secondary | ICD-10-CM

## 2018-03-01 MED ORDER — HYDRALAZINE HCL 25 MG PO TABS: 25.0000 mg | ORAL_TABLET | Freq: Three times a day (TID) | ORAL | 3 refills | Status: DC | PRN

## 2018-03-01 NOTE — Patient Instructions (Addendum)
Medication Instructions:  Please monitor blood pressure If blood pressure is >160 in the morning, take a hydralazine one pill  Losartan at dinner  Recheck blood pressure before bed If blood pressure is >160, take a hydralazine  OK to hold the metoprolol in the PM  Labwork:  No new labs needed  Testing/Procedures:  No further testing at this time   Follow-Up: It was a pleasure seeing you in the office today. Please call us if you have new issues that need to be addressed before your next appt.  [phone]  Your physician wants you to follow-up in: 12 months  If you need a refill on your cardiac medications before your next appointment, please call your pharmacy.  For educational health videos Log in to : [email or web address] Or : [email or web address], password : [email or web address]
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
## 2018-03-02 NOTE — Telephone Encounter (Signed)
Patient came in to see provider 03/01/18 and recommendations addressed her medications and blood pressures.

## 2018-03-02 NOTE — Telephone Encounter (Signed)
Restarted hydralazine She can take this 3 times a day breakfast lunch dinner If one pill does not work after 2 hours would take additional pill  She may have to start taking this medication on a regular basis,  breakfast lunch dinner If blood pressure continues to run high

## 2018-03-09 ENCOUNTER — Ambulatory Visit: Payer: Medicare PPO | Admitting: Physical Therapy

## 2018-03-16 ENCOUNTER — Telehealth: Payer: Self-pay | Admitting: Cardiovascular Disease

## 2018-03-16 NOTE — Telephone Encounter (Signed)
°*  STAT* If patient is at the pharmacy, call can be transferred to refill team.   1. Which medications need to be refilled? (please list name of each medication and dose if known) Losartan   2. Which pharmacy/location (including street and city if local pharmacy) is medication to be sent to? [facility] mail order   3. Do they need a 30 day or 90 day supply? 90 day

## 2018-03-16 NOTE — Telephone Encounter (Signed)
Please advise how patient should be taking medication.  Per last office visit and phone note patient was taking Losartan 100MG  daily.   Sig: Take 0.5-1 tablets (50-100 mg total) by mouth daily. Pt to restart taking 1/2 tablet for week and then increase if BP is elevated.

## 2018-03-16 NOTE — Telephone Encounter (Signed)
She was to start taking losartan 1/2 tablet (50 mg) for a week and if her blood pressures remain elevated to then increase to 1 whole pill. So we would just need to call her and see if she had to increase it.

## 2018-03-17 ENCOUNTER — Other Ambulatory Visit: Payer: Self-pay

## 2018-03-17 MED ORDER — LOSARTAN POTASSIUM 100 MG PO TABS: 100.0000 mg | ORAL_TABLET | Freq: Every day | ORAL | 0 refills | Status: DC

## 2018-03-17 NOTE — Telephone Encounter (Signed)
Requested Prescriptions   Signed Prescriptions Disp Refills  . losartan (COZAAR) 100 MG tablet 90 tablet 0    Sig: Take 1 tablet (100 mg total) by mouth daily.    Authorizing Provider: Antonieta IbaGOLLAN, TIMOTHY J    Ordering User: Margrett RudSLAYTON, Shelisha Gautier N

## 2018-03-17 NOTE — Telephone Encounter (Signed)
Spoke with patient. She stated she did have to increase her Losartan 100mg  to a whole tablet. I made her aware that I would change the instructions on the medication to "Losartan 100MG  daily" as she stated she was currently taking and send that in to the pharmacy of her choice.   losartan (COZAAR) 100 MG tablet 90 tablet 0 03/17/2018 06/15/2018   Sig - Route: Take 1 tablet (100 mg total) by mouth daily. - Oral   Sent to pharmacy as: losartan (COZAAR) 100 MG tablet   E-Prescribing Status: Receipt confirmed by pharmacy (03/17/2018 10:51 AM EDT)   Pharmacy   [facility] [place] - [address]

## 2018-04-07 ENCOUNTER — Other Ambulatory Visit: Payer: Self-pay | Admitting: Family Medicine

## 2018-04-07 DIAGNOSIS — E039 Hypothyroidism, unspecified: Secondary | ICD-10-CM

## 2018-04-21 DIAGNOSIS — M170 Bilateral primary osteoarthritis of knee: Secondary | ICD-10-CM | POA: Diagnosis not present

## 2018-05-11 DIAGNOSIS — M1711 Unilateral primary osteoarthritis, right knee: Secondary | ICD-10-CM | POA: Diagnosis not present

## 2018-05-18 ENCOUNTER — Other Ambulatory Visit: Payer: Self-pay | Admitting: Cardiovascular Disease

## 2018-05-24 ENCOUNTER — Other Ambulatory Visit: Payer: Medicare PPO

## 2018-05-24 DIAGNOSIS — R5382 Chronic fatigue, unspecified: Secondary | ICD-10-CM | POA: Diagnosis not present

## 2018-05-24 DIAGNOSIS — I4891 Unspecified atrial fibrillation: Secondary | ICD-10-CM | POA: Diagnosis not present

## 2018-05-24 DIAGNOSIS — Z7901 Long term (current) use of anticoagulants: Secondary | ICD-10-CM | POA: Diagnosis not present

## 2018-05-24 DIAGNOSIS — F339 Major depressive disorder, recurrent, unspecified: Secondary | ICD-10-CM | POA: Diagnosis not present

## 2018-05-24 DIAGNOSIS — E032 Hypothyroidism due to medicaments and other exogenous substances: Secondary | ICD-10-CM

## 2018-05-24 DIAGNOSIS — I10 Essential (primary) hypertension: Secondary | ICD-10-CM | POA: Diagnosis not present

## 2018-05-24 DIAGNOSIS — E782 Mixed hyperlipidemia: Secondary | ICD-10-CM

## 2018-05-24 DIAGNOSIS — Z0000 Encounter for general adult medical examination without abnormal findings: Secondary | ICD-10-CM | POA: Diagnosis not present

## 2018-05-24 DIAGNOSIS — R7303 Prediabetes: Secondary | ICD-10-CM | POA: Diagnosis not present

## 2018-05-25 LAB — COMPLETE METABOLIC PANEL WITH GFR
AG Ratio: 1.5 (calc) (ref 1.0–2.5)
ALBUMIN MSPROF: 3.8 g/dL (ref 3.6–5.1)
ALT: 15 U/L (ref 6–29)
AST: 20 U/L (ref 10–35)
Alkaline phosphatase (APISO): 66 U/L (ref 33–130)
BILIRUBIN TOTAL: 0.5 mg/dL (ref 0.2–1.2)
BUN / CREAT RATIO: 15 (calc) (ref 6–22)
BUN: 13 mg/dL (ref 7–25)
CALCIUM: 9 mg/dL (ref 8.6–10.4)
CHLORIDE: 104 mmol/L (ref 98–110)
CO2: 25 mmol/L (ref 20–32)
CREATININE: 0.89 mg/dL — AB (ref 0.60–0.88)
GFR, EST AFRICAN AMERICAN: 69 mL/min/{1.73_m2} (ref >=60)
GFR, EST NON AFRICAN AMERICAN: 60 mL/min/{1.73_m2} (ref >=60)
GLUCOSE: 102 mg/dL — AB (ref 65–99)
Globulin: 2.5 g/dL (calc) (ref 1.9–3.7)
Potassium: 4.2 mmol/L (ref 3.5–5.3)
Sodium: 137 mmol/L (ref 135–146)
TOTAL PROTEIN: 6.3 g/dL (ref 6.1–8.1)

## 2018-05-25 LAB — CBC WITH DIFFERENTIAL/PLATELET
BASOS PCT: 1.3 %
Basophils Absolute: 103 cells/uL (ref 0–200)
EOS ABS: 324 {cells}/uL (ref 15–500)
Eosinophils Relative: 4.1 %
HCT: 39.9 % (ref 35.0–45.0)
HEMOGLOBIN: 13.2 g/dL (ref 11.7–15.5)
Lymphs Abs: 2520 cells/uL (ref 850–3900)
MCH: 30.2 pg (ref 27.0–33.0)
MCHC: 33.1 g/dL (ref 32.0–36.0)
MCV: 91.3 fL (ref 80.0–100.0)
MONOS PCT: 8.6 %
MPV: 9.4 fL (ref 7.5–12.5)
NEUTROS ABS: 4274 {cells}/uL (ref 1500–7800)
Neutrophils Relative %: 54.1 %
Platelets: 321 10*3/uL (ref 140–400)
RBC: 4.37 10*6/uL (ref 3.80–5.10)
RDW: 14.6 % (ref 11.0–15.0)
Total Lymphocyte: 31.9 %
WBC mixed population: 679 cells/uL (ref 200–950)
WBC: 7.9 10*3/uL (ref 3.8–10.8)

## 2018-05-25 LAB — LIPID PANEL
Cholesterol: 276 mg/dL — ABNORMAL HIGH (ref <200)
HDL: 54 mg/dL (ref >50)
LDL Cholesterol (Calc): 192 mg/dL (calc) — ABNORMAL HIGH
Non-HDL Cholesterol (Calc): 222 mg/dL (calc) — ABNORMAL HIGH (ref <130)
TRIGLYCERIDES: 146 mg/dL (ref <150)
Total CHOL/HDL Ratio: 5.1 (calc) — ABNORMAL HIGH (ref <5.0)

## 2018-05-25 LAB — HEMOGLOBIN A1C
EAG (MMOL/L): 6.5 (calc)
Hgb A1c MFr Bld: 5.7 % of total Hgb — ABNORMAL HIGH (ref <5.7)
Mean Plasma Glucose: 117 (calc)

## 2018-05-25 LAB — TSH: TSH: 5.06 m[IU]/L — AB (ref 0.40–4.50)

## 2018-05-25 LAB — T4, FREE: FREE T4: 1.5 ng/dL (ref 0.8–1.8)

## 2018-05-26 ENCOUNTER — Ambulatory Visit (INDEPENDENT_AMBULATORY_CARE_PROVIDER_SITE_OTHER): Payer: Medicare PPO | Admitting: Family Medicine

## 2018-05-26 ENCOUNTER — Encounter: Payer: Self-pay | Admitting: Family Medicine

## 2018-05-26 ENCOUNTER — Encounter: Payer: Medicare PPO | Admitting: Family Medicine

## 2018-05-26 ENCOUNTER — Other Ambulatory Visit: Payer: Self-pay | Admitting: Family Medicine

## 2018-05-26 VITALS — BP 124/70 | HR 60 | Temp 98.4°F | Resp 16 | Ht 63.0 in | Wt 150.0 lb

## 2018-05-26 DIAGNOSIS — I10 Essential (primary) hypertension: Secondary | ICD-10-CM | POA: Diagnosis not present

## 2018-05-26 DIAGNOSIS — R7303 Prediabetes: Secondary | ICD-10-CM

## 2018-05-26 DIAGNOSIS — Z0000 Encounter for general adult medical examination without abnormal findings: Secondary | ICD-10-CM | POA: Diagnosis not present

## 2018-05-26 DIAGNOSIS — M150 Primary generalized (osteo)arthritis: Secondary | ICD-10-CM | POA: Diagnosis not present

## 2018-05-26 DIAGNOSIS — I480 Paroxysmal atrial fibrillation: Secondary | ICD-10-CM | POA: Diagnosis not present

## 2018-05-26 DIAGNOSIS — M159 Polyosteoarthritis, unspecified: Secondary | ICD-10-CM

## 2018-05-26 DIAGNOSIS — E890 Postprocedural hypothyroidism: Secondary | ICD-10-CM

## 2018-05-26 DIAGNOSIS — E782 Mixed hyperlipidemia: Secondary | ICD-10-CM | POA: Diagnosis not present

## 2018-05-26 DIAGNOSIS — E032 Hypothyroidism due to medicaments and other exogenous substances: Secondary | ICD-10-CM

## 2018-05-26 DIAGNOSIS — M1990 Unspecified osteoarthritis, unspecified site: Secondary | ICD-10-CM | POA: Insufficient documentation

## 2018-05-26 NOTE — Patient Instructions (Addendum)
Thank you for coming to the office today.  Ask Orthopedics about topical anti-inflammatory - Diclofenac (Voltaren) gel topical for arthritis pain relief - this would be safe with your Eliquis and safer than taking any oral anti inflammatory  Continue Levothyroxine daily  RESTART Simvastatin  daily - the cholesterol results were elevated off of the med  Call our office with updated med list if you notice any differences  Follow-up with Dr Mariah Milling yearly next in 02/2019  DUE for FASTING BLOOD WORK (no food or drink after midnight before the lab appointment, only water or coffee without cream/sugar on the morning of)  SCHEDULE "Lab Only" visit in the morning at the clinic for lab draw in 6 MONTHS   - Make sure Lab Only appointment is at about 1 week before your next appointment, so that results will be available  For Lab Results, once available within 2-3 days of blood draw, you can can log in to MyChart online to view your results and a brief explanation. Also, we can discuss results at next follow-up visit.  Please schedule a Follow-up Appointment to: Return in about 6 months (around 11/26/2018) for PreDM, Thyroid lab review, OA/Knees.  If you have any other questions or concerns, please feel free to call the office or send a message through MyChart. You may also schedule an earlier appointment if necessary.  Additionally, you may be receiving a survey about your experience at our office within a few days to 1 week by e-mail or mail. We value your feedback.  Saralyn Pilar, DO [facility], [place]

## 2018-05-26 NOTE — Assessment & Plan Note (Signed)
Improved TSH now still slightly elevated but closer to normal range, and normal Free T4 Continue Levothyroxine daily

## 2018-05-26 NOTE — Assessment & Plan Note (Signed)
Uncontrolled cholesterol off simvastatin now. Seems statin was not causing her prior side effect, she did not restart med as asked, she kept holding for months Last lipid panel 04/2018  Plan: 1. RESTART Simvastatin  daily 2. On anticoagulation 3. Encourage improved lifestyle 4. Follow-up q 12 mo lipids

## 2018-05-26 NOTE — Assessment & Plan Note (Signed)
Stable without evidence of AFib today on exam Today without bradycardia, without RVR Rate control on Metoprolol low dose On anticoagulation Eliquis Followed by [facility] Cardiology q 12 mo

## 2018-05-26 NOTE — Progress Notes (Signed)
Subjective:    Patient ID: Stacey Duncan, female    DOB: [birth year], 84 y.o.   MRN: [id number]  Stacey Duncan is a 84 y.o. female presenting on 05/26/2018 for Annual Exam   HPI   Here for Annual Physical and Lab Review.  Follow-up Osteoarthritis History of chronic OA joint pain in past, intermittent flares, she had a recent flare of acute R knee pain back in 03/2018 without known trigger, she has arthritis in both knees. She is followed by [facility], Altamese Cabal PA, recently had a cortisone injection in Right knee, she is wearing a brace, and feels more secure with knee brace, she has already scheduled for knee synvisc injections coming up in June 2019 - Last imaging X-ray in 03/2018 showed significant OA/DJD both knees, L actual worse than R. She has prior history of cartilage surgery on Left knee when age 32s  Pre-Diabetes Reports previous history of A1c 6.1 now improved to 5.7 CBG not checking Meds: none Currently on ARB Denies hypoglycemia  HYPERLIPIDEMIA: - Reports question about simvastatin, she remains off this med, was told to try holding temporarily in past to see if this was causing fatigue but it did not change her symptoms, but she continued to hold it. Last lipid panel 04/2018 now elevated due to months off simvastatin Previously on Simvastatin  daily tolerated in past  CHRONIC HTN / Atrial Fibrillation Reports previously changed doses of Losartan in past, also has history on BB low dose and previously Amiodarone. Has since been off Amio since end of 2018. Current Meds - Losartan  daily, Metoprolol 12.5mg  AM ONLY, also has Hydralazine  TID PRN ONLY BP >160 - caution with history of orthostasis and dizziness   - She does ask about a different BP medication, she is confused about, does not have meds with her, not in med rec or on cardiology note Reports good compliance, took meds today. Tolerating well, w/o complaints. Lifestyle: - Diet: admits to  less hydration - Exercise: limited activity due to knees Denies known AFib flare  Hypothyroidism Improved but still slightly elevated TSH, normal Free T4, she does not endorse symptoms related to thyroid at this time, prefers to continue Levothyroxine 100  Health Maintenance: UTD Pneumonia vaccine, TDap  Depression screen [facility] 2/9 05/26/2018 02/24/2018 11/09/2017  Decreased Interest 0 1 1  Down, Depressed, Hopeless 0 0 1  PHQ - 2 Score 0 1 2  Altered sleeping 0 3 3  Tired, decreased energy 0 2 3  Change in appetite 0 0 2  Feeling bad or failure about yourself  0 0 1  Trouble concentrating 0 0 0  Moving slowly or fidgety/restless 0 1 0  Suicidal thoughts 0 0 0  PHQ-9 Score 0 7 11  Difficult doing work/chores Not difficult at all Somewhat difficult Somewhat difficult    Past Medical History:  Diagnosis Date  . Fibromyalgia   . GERD (gastroesophageal reflux disease)   . HTN (hypertension)   . Hypothyroidism   . Osteoarthritis    a. bilateral knees  . PAF (paroxysmal atrial fibrillation) (HCC)    a. on Eliquis;   Past Surgical History:  Procedure Laterality Date  . ABDOMINAL HYSTERECTOMY    . CHOLECYSTECTOMY    . ROTATOR CUFF REPAIR     left    Social History   Socioeconomic History  . Marital status: Married    Spouse name: Not on file  . Number of children: Not on file  . Years  of education: Not on file  . Highest education level: Not on file  Occupational History  . Not on file  Social Needs  . Financial resource strain: Not on file  . Food insecurity:    Worry: Not on file    Inability: Not on file  . Transportation needs:    Medical: Not on file    Non-medical: Not on file  Tobacco Use  . Smoking status: Never Smoker  . Smokeless tobacco: Never Used  Substance and Sexual Activity  . Alcohol use: Yes    Alcohol/week: 3.0 oz    Types: 5 Glasses of wine per week    Comment: glass of wine daily  . Drug use: No  . Sexual activity: Not on file  Lifestyle   . Physical activity:    Days per week: Not on file    Minutes per session: Not on file  . Stress: Not on file  Relationships  . Social connections:    Talks on phone: Not on file    Gets together: Not on file    Attends religious service: Not on file    Active member of club or organization: Not on file    Attends meetings of clubs or organizations: Not on file    Relationship status: Not on file  . Intimate partner violence:    Fear of current or ex partner: Not on file    Emotionally abused: Not on file    Physically abused: Not on file    Forced sexual activity: Not on file  Other Topics Concern  . Not on file  Social History Narrative  . Not on file   Family History  Problem Relation Age of Onset  . Heart disease Mother 62       CABG  . Heart attack Maternal Grandfather   . Heart attack Maternal Grandmother    Current Outpatient Medications on File Prior to Visit  Medication Sig  . apixaban (ELIQUIS) 5 MG TABS tablet TAKE ONE (1) TABLET BY MOUTH TWO (2) TIMES DAILY  . Cholecalciferol (VITAMIN D-1000 MAX ST) 1000 UNITS tablet Take 1,000 Units by mouth daily.   Marland Kitchen FLUoxetine (PROZAC) 40 MG capsule Take 1 capsule (40 mg total) daily by mouth.  . hydrALAZINE (APRESOLINE) 25 MG tablet Take 1 tablet (25 mg total) by mouth 3 (three) times daily as needed (As needed for blood pressure greater than 160).  Marland Kitchen levothyroxine (SYNTHROID, LEVOTHROID) 100 MCG tablet TAKE 1 TABLET EVERY DAY  . losartan (COZAAR) 100 MG tablet TAKE 1 TABLET EVERY DAY  . metoprolol tartrate (LOPRESSOR) 25 MG tablet TAKES 1/2 TABLET TWICE A DAY  . omeprazole (PRILOSEC) 40 MG capsule Take 1 capsule (40 mg total) by mouth daily.  . simvastatin (ZOCOR) 40 MG tablet Take 1 tablet (40 mg total) by mouth at bedtime.   No current facility-administered medications on file prior to visit.     Review of Systems  Constitutional: Negative for activity change, appetite change, chills, diaphoresis, fatigue and fever.    HENT: Negative for congestion and hearing loss.   Eyes: Negative for visual disturbance.  Respiratory: Negative for apnea, cough, choking, chest tightness, shortness of breath and wheezing.   Cardiovascular: Negative for chest pain, palpitations and leg swelling.  Gastrointestinal: Negative for abdominal pain, anal bleeding, blood in stool, constipation, diarrhea, nausea and vomiting.  Endocrine: Negative for cold intolerance.  Genitourinary: Negative for decreased urine volume, difficulty urinating, dysuria, frequency, hematuria, urgency and vaginal discharge.  Musculoskeletal: Positive  for arthralgias (R>L knees). Negative for back pain and neck pain.  Skin: Negative for rash.  Allergic/Immunologic: Negative for environmental allergies.  Neurological: Negative for dizziness, weakness, light-headedness, numbness and headaches.  Hematological: Negative for adenopathy.  Psychiatric/Behavioral: Negative for behavioral problems, dysphoric mood and sleep disturbance. The patient is not nervous/anxious.    Per HPI unless specifically indicated above     Objective:    BP 124/70   Pulse 60   Temp 98.4 F (36.9 C) (Oral)   Resp 16   Ht  (1.6 m)   Wt 150 lb (68 kg)   BMI 26.57 kg/m   Wt Readings from Last 3 Encounters:  05/26/18 150 lb (68 kg)  03/01/18 148 lb (67.1 kg)  02/24/18 150 lb (68 kg)    Physical Exam  Constitutional: She is oriented to person, place, and time. She appears well-developed and well-nourished. No distress.  Well-appearing, comfortable, cooperative  HENT:  Head: Normocephalic and atraumatic.  Mouth/Throat: Oropharynx is clear and moist.  Eyes: Pupils are equal, round, and reactive to light. Conjunctivae and EOM are normal. Right eye exhibits no discharge. Left eye exhibits no discharge.  Neck: Normal range of motion. Neck supple. No thyromegaly present.  Cardiovascular: Normal rate, regular rhythm, normal heart sounds and intact distal pulses.  No murmur  heard. Without ectopy or irregular rhythm on exam  Pulmonary/Chest: Effort normal and breath sounds normal. No respiratory distress. She has no wheezes. She has no rales.  Abdominal: Soft. Bowel sounds are normal. She exhibits no distension and no mass. There is no tenderness.  Musculoskeletal: She exhibits no edema.  Upper / Lower Extremities: - Normal muscle tone, strength bilateral upper extremities 5/5, lower extremities 5/5  Right Knee Wearing brace, some limited movement. No edema or effusion.  Lymphadenopathy:    She has no cervical adenopathy.  Neurological: She is alert and oriented to person, place, and time.  Distal sensation intact to light touch all extremities  Skin: Skin is warm and dry. No rash noted. She is not diaphoretic. No erythema.  Psychiatric: She has a normal mood and affect. Her behavior is normal.  Well groomed, good eye contact, normal speech and thoughts  Nursing note and vitals reviewed.  Results for orders placed or performed in visit on 05/24/18  T4, free  Result Value Ref Range   Free T4 1.5 0.8 - 1.8 ng/dL  TSH  Result Value Ref Range   TSH 5.06 (H) 0.40 - 4.50 mIU/L  Lipid panel  Result Value Ref Range   Cholesterol 276 (H) <200 mg/dL   HDL 54 >16 mg/dL   Triglycerides 109 <604 mg/dL   LDL Cholesterol (Calc) 192 (H) mg/dL (calc)   Total CHOL/HDL Ratio 5.1 (H) <5.0 (calc)   Non-HDL Cholesterol (Calc) 222 (H) <130 mg/dL (calc)  COMPLETE METABOLIC PANEL WITH GFR  Result Value Ref Range   Glucose, Bld 102 (H) 65 - 99 mg/dL   BUN 13 7 - 25 mg/dL   Creat 5.40 (H) 9.81 - 0.88 mg/dL   GFR, Est Non African American 60 > OR = 60 mL/min/1.38m2   GFR, Est African American 69 > OR = 60 mL/min/1.38m2   BUN/Creatinine Ratio 15 6 - 22 (calc)   Sodium 137 135 - 146 mmol/L   Potassium 4.2 3.5 - 5.3 mmol/L   Chloride 104 98 - 110 mmol/L   CO2 25 20 - 32 mmol/L   Calcium 9.0 8.6 - 10.4 mg/dL   Total Protein 6.3 6.1 -  8.1 g/dL   Albumin 3.8 3.6 - 5.1 g/dL    Globulin 2.5 1.9 - 3.7 g/dL (calc)   AG Ratio 1.5 1.0 - 2.5 (calc)   Total Bilirubin 0.5 0.2 - 1.2 mg/dL   Alkaline phosphatase (APISO) 66 33 - 130 U/L   AST 20 10 - 35 U/L   ALT 15 6 - 29 U/L  CBC with Differential/Platelet  Result Value Ref Range   WBC 7.9 3.8 - 10.8 Thousand/uL   RBC 4.37 3.80 - 5.10 Million/uL   Hemoglobin 13.2 11.7 - 15.5 g/dL   HCT 16.1 09.6 - 04.5 %   MCV 91.3 80.0 - 100.0 fL   MCH 30.2 27.0 - 33.0 pg   MCHC 33.1 32.0 - 36.0 g/dL   RDW 40.9 81.1 - 91.4 %   Platelets 321 140 - 400 Thousand/uL   MPV 9.4 7.5 - 12.5 fL   Neutro Abs 4,274 1,500 - 7,800 cells/uL   Lymphs Abs 2,520 850 - 3,900 cells/uL   WBC mixed population 679 200 - 950 cells/uL   Eosinophils Absolute 324 15 - 500 cells/uL   Basophils Absolute 103 0 - 200 cells/uL   Neutrophils Relative % 54.1 %   Total Lymphocyte 31.9 %   Monocytes Relative 8.6 %   Eosinophils Relative 4.1 %   Basophils Relative 1.3 %  Hemoglobin A1c  Result Value Ref Range   Hgb A1c MFr Bld 5.7 (H) <5.7 % of total Hgb   Mean Plasma Glucose 117 (calc)   eAG (mmol/L) 6.5 (calc)      Assessment & Plan:   Problem List Items Addressed This Visit    Essential hypertension    Well-controlled HTN. History of low BP in past - Home BP readings normal  Paroxysmal AFib    Plan:  1. Continue current BP regimen Losartan 100mg  daily, Metoprolol 12.5mg  DAILY - Has Hydralazine 25mg  TID PRN BP >160 - not taking, unclear exactly her current med regimen she has some confusion - to call us back with updated med list 2. Encourage improved lifestyle - low sodium diet, regular exercise 3. Continue monitor BP outside office, bring readings to next visit, if persistently >140/90 or new symptoms notify office sooner 4. Follow-up q 6 mo      Relevant Medications   simvastatin (ZOCOR) 40 MG tablet   Hyperlipidemia    Uncontrolled cholesterol off simvastatin now. Seems statin was not causing her prior side effect, she did not restart  med as asked, she kept holding for months Last lipid panel 04/2018  Plan: 1. RESTART Simvastatin 40mg  daily 2. On anticoagulation 3. Encourage improved lifestyle 4. Follow-up q 12 mo lipids      Relevant Medications   simvastatin (ZOCOR) 40 MG tablet   Hypothyroidism    Improved TSH now still slightly elevated but closer to normal range, and normal Free T4 Continue Levothyroxine daily      Osteoarthritis    Consistent with b/l knee OA/DJD Followed by [facility] Prior X-rays done 03/2018 consistent, show L>R but she has symptoms R>L with some narrow joint spaces Improved w/ brace, biofreeze, Tylenol PRN - avoiding NSAIDs but they have helped in past S/p cortisone in past Now planning knee synvisc injections per Ortho, she prefers to avoid surgery  Recommend trial of Diclofenac topical gel - ask Ortho      Paroxysmal atrial fibrillation (HCC)    Stable without evidence of AFib today on exam Today without bradycardia, without RVR Rate control on Metoprolol  low dose On anticoagulation Eliquis Followed by [facility] q 12 mo      Relevant Medications   simvastatin (ZOCOR) 40 MG tablet   Pre-diabetes    Well-controlled Pre-DM with A1c 5.7 from prior 6.1  Plan:  1. Not on any therapy currently  2. Encourage improved lifestyle - low carb, low sugar diet, reduce portion size, continue improving regular exercise as tolerated - limited by knees 3. Follow-up q 6 mo A1c       Other Visit Diagnoses    Annual physical exam    -  Primary    Updated health maintenance Reviewed lab results Encourage keep improving diet lifestyle    No orders of the defined types were placed in this encounter.   Follow up plan: Return in about 6 months (around 11/26/2018) for PreDM, Thyroid lab review, OA/Knees.  Future labs ordered for 10/2018  Saralyn Pilar, DO [facility] 05/26/2018, 6:39 PM

## 2018-05-26 NOTE — Assessment & Plan Note (Addendum)
Well-controlled HTN. History of low BP in past - Home BP readings normal  Paroxysmal AFib    Plan:  1. Continue current BP regimen Losartan  daily, Metoprolol 12.5mg  DAILY - Has Hydralazine  TID PRN BP >160 - not taking, unclear exactly her current med regimen she has some confusion - to call us back with updated med list 2. Encourage improved lifestyle - low sodium diet, regular exercise 3. Continue monitor BP outside office, bring readings to next visit, if persistently >140/90 or new symptoms notify office sooner 4. Follow-up q 6 mo

## 2018-05-26 NOTE — Assessment & Plan Note (Signed)
Well-controlled Pre-DM with A1c 5.7 from prior 6.1  Plan:  1. Not on any therapy currently  2. Encourage improved lifestyle - low carb, low sugar diet, reduce portion size, continue improving regular exercise as tolerated - limited by knees 3. Follow-up q 6 mo A1c

## 2018-05-26 NOTE — Assessment & Plan Note (Addendum)
Consistent with b/l knee OA/DJD Followed by [facility] Prior X-rays done 03/2018 consistent, show L>R but she has symptoms R>L with some narrow joint spaces Improved w/ brace, biofreeze, Tylenol PRN - avoiding NSAIDs but they have helped in past S/p cortisone in past Now planning knee synvisc injections per Ortho, she prefers to avoid surgery  Recommend trial of Diclofenac topical gel - ask Ortho

## 2018-05-27 ENCOUNTER — Other Ambulatory Visit: Payer: Self-pay

## 2018-05-27 ENCOUNTER — Ambulatory Visit
Admission: EM | Admit: 2018-05-27 | Discharge: 2018-05-27 | Disposition: A | Discharge status: Home / Self Care | Payer: Medicare PPO | Attending: Family | Admitting: Family

## 2018-05-27 ENCOUNTER — Telehealth: Payer: Self-pay | Admitting: Cardiovascular Disease

## 2018-05-27 DIAGNOSIS — M542 Cervicalgia: Secondary | ICD-10-CM | POA: Diagnosis not present

## 2018-05-27 DIAGNOSIS — M62838 Other muscle spasm: Secondary | ICD-10-CM

## 2018-05-27 MED ORDER — CYCLOBENZAPRINE HCL 5 MG PO TABS: 5.0000 mg | ORAL_TABLET | Freq: Three times a day (TID) | ORAL | 0 refills | Status: DC | PRN

## 2018-05-27 NOTE — Telephone Encounter (Signed)
Pt states she went to her pcp yesterday for muscle spasms in her neck. States she has taken Tylenol and has applied heat. Pt asks if Dr. Mariah Millinggollan can recommend something for this.

## 2018-05-27 NOTE — Telephone Encounter (Signed)
Patient returning call.

## 2018-05-27 NOTE — Telephone Encounter (Signed)
Left voicemail message to call back  

## 2018-05-27 NOTE — ED Triage Notes (Signed)
Patient complains of neck spasms that started Monday. Patient states that she saw Dr. Kirtland Bouchard yesterday and he told her he did not want to start a muscle relaxer but she feels like she needs something.

## 2018-05-27 NOTE — ED Provider Notes (Signed)
[facility]    CSN: [id number] Arrival date & time: 05/27/18  1156     History   Chief Complaint Chief Complaint  Patient presents with  . Spasms    HPI Stacey Duncan is a 84 y.o. female.   84 year old female presents with left sided upper neck spasms for the past 4 to 5 days. No distinct injury. Pain and cramps travel from the back of her head down to mid-scapular area. She has tried applying heat and taking Tylenol with no relief. She has taken muscle relaxers in the past with success. She did see her PCP yesterday for routine annual exam and mentioned the neck pain but Dr. Kirtland Bouchard wanted to wait longer before prescribing a muscle relaxer. She is having difficulty sleeping due to pain and cramps and requests treatment today. She does have a history of A. Fib and currently on Eliquis so she can not take other NSAIDs. Other chronic health issues include HTN, hyperlipidemia, GERD, fibromyalgia, hypothyroidism, and arthritis and she is on multiple medications for management.   The history is provided by the patient and the spouse.    Past Medical History:  Diagnosis Date  . Fibromyalgia   . GERD (gastroesophageal reflux disease)   . HTN (hypertension)   . Hypothyroidism   . Osteoarthritis    a. bilateral knees  . PAF (paroxysmal atrial fibrillation) (HCC)    a. on Eliquis;    Patient Active Problem List   Diagnosis Date Noted  . Osteoarthritis 05/26/2018  . Pre-diabetes 11/23/2017  . Osteopenia determined by x-ray 11/09/2017  . GAD (generalized anxiety disorder) 10/20/2017  . GERD (gastroesophageal reflux disease) 04/28/2017  . Essential hypertension 07/28/2016  . Paroxysmal atrial fibrillation (HCC)   . Hyperlipidemia 01/31/2016  . Fibromyalgia 12/16/2015  . Chronic fatigue 04/19/2015  . Hypothyroidism 04/19/2015  . Major depression, recurrent, chronic (HCC) 04/19/2015    Past Surgical History:  Procedure Laterality Date  . ABDOMINAL HYSTERECTOMY    .  CHOLECYSTECTOMY    . ROTATOR CUFF REPAIR     left     OB History   None      Home Medications    Prior to Admission medications   Medication Sig Start Date End Date Taking? Authorizing Provider  apixaban (ELIQUIS) 5 MG TABS tablet TAKE ONE (1) TABLET BY MOUTH TWO (2) TIMES DAILY 03/29/17  Yes Gollan, Tollie Pizza, MD  Cholecalciferol (VITAMIN D-1000 MAX ST) 1000 UNITS tablet Take 1,000 Units by mouth daily.    Yes [provider]  FLUoxetine (PROZAC) 40 MG capsule Take 1 capsule (40 mg total) daily by mouth. 11/09/17  Yes Karamalegos, Netta Neat, DO  hydrALAZINE (APRESOLINE) 25 MG tablet Take 1 tablet (25 mg total) by mouth 3 (three) times daily as needed (As needed for blood pressure greater than 160). 03/01/18 05/30/18 Yes Gollan, Tollie Pizza, MD  levothyroxine (SYNTHROID, LEVOTHROID) 100 MCG tablet TAKE 1 TABLET EVERY DAY 04/07/18  Yes Karamalegos, Alexander J, DO  losartan (COZAAR) 100 MG tablet TAKE 1 TABLET EVERY DAY 05/18/18  Yes Gollan, Tollie Pizza, MD  metoprolol tartrate (LOPRESSOR) 25 MG tablet TAKES 1/2 TABLET TWICE A DAY 01/05/18  Yes Gollan, Tollie Pizza, MD  omeprazole (PRILOSEC) 40 MG capsule Take 1 capsule (40 mg total) by mouth daily. 04/29/17  Yes Karamalegos, Netta Neat, DO  simvastatin (ZOCOR) 40 MG tablet Take 1 tablet (40 mg total) by mouth at bedtime. 05/26/18  Yes Karamalegos, Netta Neat, DO  cyclobenzaprine (FLEXERIL) 5 MG  tablet Take 1 tablet (5 mg total) by mouth 3 (three) times daily as needed for muscle spasms. 05/27/18   Sudie Grumbling, NP    Family History Family History  Problem Relation Age of Onset  . Heart disease Mother 52       CABG  . Heart attack Maternal Grandfather   . Heart attack Maternal Grandmother     Social History Social History   Tobacco Use  . Smoking status: Never Smoker  . Smokeless tobacco: Never Used  Substance Use Topics  . Alcohol use: Yes    Alcohol/week: 3.0 oz    Types: 5 Glasses of wine per week    Comment: glass of wine  daily  . Drug use: No     Allergies   Sulfa antibiotics and Sulfacetamide sodium   Review of Systems Review of Systems  Constitutional: Negative for activity change, appetite change, chills, fatigue and fever.  HENT: Negative for facial swelling, mouth sores, sore throat and trouble swallowing.   Musculoskeletal: Positive for arthralgias, myalgias, neck pain and neck stiffness. Negative for joint swelling.  Skin: Negative for color change, rash and wound.  Neurological: Positive for headaches. Negative for dizziness, tremors, seizures, syncope, facial asymmetry, speech difficulty, weakness, light-headedness and numbness.  Hematological: Negative for adenopathy. Bruises/bleeds easily.     Physical Exam Triage Vital Signs ED Triage Vitals  Enc Vitals Group     BP 05/27/18 1229 127/62     Pulse Rate 05/27/18 1229 64     Resp 05/27/18 1229 18     Temp 05/27/18 1229 98.9 F (37.2 C)     Temp Source 05/27/18 1229 Oral     SpO2 05/27/18 1229 95 %     Weight 05/27/18 1228 150 lb (68 kg)     Height 05/27/18 1228  (1.6 m)     Head Circumference --      Peak Flow --      Pain Score 05/27/18 1228 9     Pain Loc --      Pain Edu? --      Excl. in GC? --    No data found.  Updated Vital Signs BP 127/62 (BP Location: Right Arm)   Pulse 64   Temp 98.9 F (37.2 C) (Oral)   Resp 18   Ht  (1.6 m)   Wt 150 lb (68 kg)   SpO2 95%   BMI 26.57 kg/m   Visual Acuity Right Eye Distance:   Left Eye Distance:   Bilateral Distance:    Right Eye Near:   Left Eye Near:    Bilateral Near:     Physical Exam  Constitutional: She is oriented to person, place, and time. Vital signs are normal. She appears well-developed and well-nourished. She is cooperative. No distress.  She is sitting on exam table in no acute distress.   HENT:  Head: Normocephalic and atraumatic.  Right Ear: External ear normal.  Left Ear: External ear normal.  Eyes: Conjunctivae and EOM are normal.    Neck: Neck supple. No JVD present. Muscular tenderness present. Carotid bruit is not present. No neck rigidity. Decreased range of motion present. No edema and no erythema present.    Has decreased range of motion of neck, especially with hyperextension and rotation to the right. Tender along right occipital area to paraspinous muscle and upper trapezius muscle group. Muscle spasms present. No redness, warmth or distinct swelling. No numbness. Good distal pulses and capillary refill. No neuro  deficits noted.   Cardiovascular: Normal rate.  Pulmonary/Chest: Effort normal.  Musculoskeletal: She exhibits tenderness.       Cervical back: She exhibits decreased range of motion, tenderness, pain and spasm. She exhibits no swelling, no edema, no deformity and normal pulse.  Neurological: She is alert and oriented to person, place, and time. She has normal strength and normal reflexes. No sensory deficit.  Skin: Skin is warm and dry. Capillary refill takes less than 2 seconds. No rash noted. No erythema.  Psychiatric: She has a normal mood and affect. Her behavior is normal. Judgment and thought content normal.  Vitals reviewed.    UC Treatments / Results  Labs (all labs ordered are listed, but only abnormal results are displayed) Labs Reviewed - No data to display  EKG None  Radiology No results found.  Procedures Procedures (including critical care time)  Medications Ordered in UC Medications - No data to display  Initial Impression / Assessment and Plan / UC Course  I have reviewed the triage vital signs and the nursing notes.  Pertinent labs & imaging results that were available during my care of the patient were reviewed by me and considered in my medical decision making (see chart for details).     Discussed that she does have some muscle spasms and probable neck muscle strain. Recommend trial Flexeril  every 8 hours as needed- discussed risks and side effects. Continue to  apply heat to area for comfort. Recommend follow-up with her PCP in 3 to 4 days if not improving.   Final Clinical Impressions(s) / UC Diagnoses   Final diagnoses:  Muscle spasms of neck     Discharge Instructions     Recommend start Flexeril  - 1 tablet every 8 hours as needed for muscle spasms/pain. Continue to apply heat to area for comfort. Follow-up with your PCP in 3 to 4 days if not improving.     ED Prescriptions    Medication Sig Dispense Auth. Provider   cyclobenzaprine (FLEXERIL) 5 MG tablet Take 1 tablet (5 mg total) by mouth 3 (three) times daily as needed for muscle spasms. 15 tablet Sudie Grumbling, NP     Controlled Substance Prescriptions [place] Controlled Substance Registry consulted? Not Applicable   Sudie Grumbling, NP 05/28/18 1721

## 2018-05-27 NOTE — Discharge Instructions (Addendum)
Recommend start Flexeril 5mg  - 1 tablet every 8 hours as needed for muscle spasms/pain. Continue to apply heat to area for comfort. Follow-up with your PCP in 3 to 4 days if not improving.

## 2018-05-27 NOTE — Telephone Encounter (Signed)
Spoke with patient and she just wanted us to know of her discomfort and that they placed her on Flexeril 5 mg. She did not have any further questions or concerns and just wanted us to know she is taking it. She was very appreciative for the call with no further questions at this time.

## 2018-06-02 DIAGNOSIS — M1711 Unilateral primary osteoarthritis, right knee: Secondary | ICD-10-CM | POA: Diagnosis not present

## 2018-06-08 ENCOUNTER — Other Ambulatory Visit: Payer: Self-pay | Admitting: Cardiovascular Disease

## 2018-06-08 ENCOUNTER — Telehealth: Payer: Self-pay | Admitting: Cardiovascular Disease

## 2018-06-08 DIAGNOSIS — Z7901 Long term (current) use of anticoagulants: Secondary | ICD-10-CM

## 2018-06-08 NOTE — Telephone Encounter (Signed)
Refill Request.  

## 2018-06-08 NOTE — Telephone Encounter (Signed)
Sent to [facility] for refill

## 2018-06-08 NOTE — Telephone Encounter (Signed)
Eliquis 5 mg BID #180 sent to pt's pharmacy w/ 0 refills. Pt's wt 68 kg, age 84, serum creatinine 0.89, CrCl 50.51. May need to decrease dose prior to further refills - need repeat BMET before that time.

## 2018-06-08 NOTE — Telephone Encounter (Signed)
°*  STAT* If patient is at the pharmacy, call can be transferred to refill team.   1. Which medications need to be refilled? (please list name of each medication and dose if known) Eliquis 5 mg po BID   2. Which pharmacy/location (including street and city if local pharmacy) is medication to be sent to? [facility] [address]   3. Do they need a 30 day or 90 day supply? 30   PATIENT WAITING AT PHARMACY MA AWARE

## 2018-06-09 DIAGNOSIS — M1711 Unilateral primary osteoarthritis, right knee: Secondary | ICD-10-CM | POA: Diagnosis not present

## 2018-06-15 DIAGNOSIS — M1711 Unilateral primary osteoarthritis, right knee: Secondary | ICD-10-CM | POA: Diagnosis not present

## 2018-07-01 ENCOUNTER — Other Ambulatory Visit: Payer: Self-pay | Admitting: Family Medicine

## 2018-07-01 ENCOUNTER — Telehealth: Payer: Self-pay | Admitting: Family Medicine

## 2018-07-01 NOTE — Telephone Encounter (Signed)
Please transfer pt's hydralazine prescription back to [facility].

## 2018-07-01 NOTE — Telephone Encounter (Signed)
Advised patient to call cardiologist office.

## 2018-07-06 ENCOUNTER — Other Ambulatory Visit: Payer: Self-pay | Admitting: Family Medicine

## 2018-07-06 DIAGNOSIS — Z1231 Encounter for screening mammogram for malignant neoplasm of breast: Secondary | ICD-10-CM

## 2018-07-07 ENCOUNTER — Telehealth: Payer: Self-pay | Admitting: Cardiovascular Disease

## 2018-07-07 ENCOUNTER — Other Ambulatory Visit: Payer: Self-pay | Admitting: *Deleted

## 2018-07-07 MED ORDER — HYDRALAZINE HCL 25 MG PO TABS: 25.0000 mg | ORAL_TABLET | Freq: Three times a day (TID) | ORAL | 3 refills | Status: DC | PRN

## 2018-07-07 NOTE — Telephone Encounter (Signed)
Requested Prescriptions   Signed Prescriptions Disp Refills  . hydrALAZINE (APRESOLINE) 25 MG tablet 270 tablet 3    Sig: Take 1 tablet (25 mg total) by mouth 3 (three) times daily as needed (As needed for blood pressure greater than 160).    Authorizing Provider: Antonieta IbaGOLLAN, TIMOTHY J    Ordering User: Kendrick FriesLOPEZ, MARINA C

## 2018-07-07 NOTE — Telephone Encounter (Signed)
Requested Prescriptions   Signed Prescriptions Disp Refills  . hydrALAZINE (APRESOLINE) 25 MG tablet 270 tablet 3    Sig: Take 1 tablet (25 mg total) by mouth 3 (three) times daily as needed (As needed for blood pressure greater than 160).    Authorizing Provider: GOLLAN, TIMOTHY J    Ordering User: LOPEZ, MARINA C    

## 2018-07-07 NOTE — Telephone Encounter (Signed)
°*  STAT* If patient is at the pharmacy, call can be transferred to refill team.   1. Which medications need to be refilled? (please list name of each medication and dose if known) hydralazine (APRESOLINE) 25 MG - 1 tablet 3 times daily as needed  2. Which pharmacy/location (including street and city if local pharmacy) is medication to be sent to? [facility]   3. Do they need a 30 day or 90 day supply? 90 day

## 2018-07-08 ENCOUNTER — Ambulatory Visit
Admission: RE | Admit: 2018-07-08 | Discharge: 2018-07-08 | Disposition: A | Discharge status: Home / Self Care | Payer: Medicare PPO | Source: Ambulatory Visit | Attending: Family Medicine | Admitting: Family Medicine

## 2018-07-08 DIAGNOSIS — Z1231 Encounter for screening mammogram for malignant neoplasm of breast: Secondary | ICD-10-CM | POA: Diagnosis not present

## 2018-07-12 ENCOUNTER — Ambulatory Visit (INDEPENDENT_AMBULATORY_CARE_PROVIDER_SITE_OTHER): Payer: Medicare PPO

## 2018-07-12 VITALS — BP 102/62 | HR 64 | Temp 97.6°F | Resp 16 | Ht 63.0 in | Wt 149.2 lb

## 2018-07-12 DIAGNOSIS — Z0000 Encounter for general adult medical examination without abnormal findings: Secondary | ICD-10-CM | POA: Diagnosis not present

## 2018-07-12 NOTE — Patient Instructions (Addendum)
Stacey Duncan , Thank you for taking time to come for yourMedicare Wellness Visit. I appreciate your ongoing commitment to your health goals. Please review the following plan we discussed and let me know if I can assist you in the future.   Screening recommendations/referrals: Colonoscopy: completed 12/28/2002, no longer required  Mammogram: completed 07/08/2018, no longer required Bone Density: completed 09/28/1999 Recommended yearly ophthalmology/optometry visit for glaucoma screening and checkup Recommended yearly dental visit for hygiene and checkup  Vaccinations: Influenza vaccine: up to date, due 08/2018 Pneumococcal vaccine: up to date  Tdap vaccine: up to date   Shingles vaccine: shingrix eligible, check with your insurance company for coverage   Advanced directives: Advance directive discussed with you today. I have provided a copy for you to complete at home and have notarized. Once this is complete please bring a copy in to our office so we can scan it into your chart.  Conditions/risks identified: Recommend continue drinking at least 6-8 glasses of water a day   Next appointment: Follow up in one year for your annual wellness exam.    Preventive Care 65 Years and Older, Female Preventive care refers to lifestyle choices and visits with your health care provider that can promote health and wellness. What does preventive care include?  A yearly physical exam. This is also called an annual well check.  Dental exams once or twice a year.  Routine eye exams. Ask your health care provider how often you should have your eyes checked.  Personal lifestyle choices, including:  Daily care of your teeth and gums.  Regular physical activity.  Eating a healthy diet.  Avoiding tobacco and drug use.  Limiting alcohol use.  Practicing safe sex.  Taking low-dose aspirin every day.  Taking vitamin and mineral supplements as recommended by your health care provider. What  happens during an annual well check? The services and screenings done by your health care provider during your annual well check will depend on your age, overall health, lifestyle risk factors, and family history of disease. Counseling  Your health care provider may ask you questions about your:  Alcohol use.  Tobacco use.  Drug use.  Emotional well-being.  Home and relationship well-being.  Sexual activity.  Eating habits.  History of falls.  Memory and ability to understand (cognition).  Work and work Astronomerenvironment.  Reproductive health. Screening  You may have the following tests or measurements:  Height, weight, and BMI.  Blood pressure.  Lipid and cholesterol levels. These may be checked every 5 years, or more frequently if you are over 82 years old.  Skin check.  Lung cancer screening. You may have this screening every year starting at age 82 if you have a 30-pack-year history of smoking and currently smoke or have quit within the past 15 years.  Fecal occult blood test (FOBT) of the stool. You may have this test every year starting at age 82.  Flexible sigmoidoscopy or colonoscopy. You may have a sigmoidoscopy every 5 years or a colonoscopy every 10 years starting at age 82.  Hepatitis C blood test.  Hepatitis B blood test.  Sexually transmitted disease (STD) testing.  Diabetes screening. This is done by checking your blood sugar (glucose) after you have not eaten for a while (fasting). You may have this done every 1-3 years.  Bone density scan. This is done to screen for osteoporosis. You may have this done starting at age 82.  Mammogram. This may be done every 1-2 years. Talk to  your health care provider about how often you should have regular mammograms. Talk with your health care provider about your test results, treatment options, and if necessary, the need for more tests. Vaccines  Your health care provider may recommend certain vaccines, such  as:  Influenza vaccine. This is recommended every year.  Tetanus, diphtheria, and acellular pertussis (Tdap, Td) vaccine. You may need a Td booster every 10 years.  Zoster vaccine. You may need this after age 8.  Pneumococcal 13-valent conjugate (PCV13) vaccine. One dose is recommended after age 82.  Pneumococcal polysaccharide (PPSV23) vaccine. One dose is recommended after age 45. Talk to your health care provider about which screenings and vaccines you need and how often you need them. This information is not intended to replace advice given to you by your health care provider. Make sure you discuss any questions you have with your health care provider. Document Released: 01/10/2016 Document Revised: 09/02/2016 Document Reviewed: 10/15/2015 Elsevier Interactive Patient Education  2017 [place] Prevention in the Home Falls can cause injuries. They can happen to people of all ages. There are many things you can do to make your home safe and to help prevent falls. What can I do on the outside of my home?  Regularly fix the edges of walkways and driveways and fix any cracks.  Remove anything that might make you trip as you walk through a door, such as a raised step or threshold.  Trim any bushes or trees on the path to your home.  Use bright outdoor lighting.  Clear any walking paths of anything that might make someone trip, such as rocks or tools.  Regularly check to see if handrails are loose or broken. Make sure that both sides of any steps have handrails.  Any raised decks and porches should have guardrails on the edges.  Have any leaves, snow, or ice cleared regularly.  Use sand or salt on walking paths during winter.  Clean up any spills in your garage right away. This includes oil or grease spills. What can I do in the bathroom?  Use night lights.  Install grab bars by the toilet and in the tub and shower. Do not use towel bars as grab bars.  Use  non-skid mats or decals in the tub or shower.  If you need to sit down in the shower, use a plastic, non-slip stool.  Keep the floor dry. Clean up any water that spills on the floor as soon as it happens.  Remove soap buildup in the tub or shower regularly.  Attach bath mats securely with double-sided non-slip rug tape.  Do not have throw rugs and other things on the floor that can make you trip. What can I do in the bedroom?  Use night lights.  Make sure that you have a light by your bed that is easy to reach.  Do not use any sheets or blankets that are too big for your bed. They should not hang down onto the floor.  Have a firm chair that has side arms. You can use this for support while you get dressed.  Do not have throw rugs and other things on the floor that can make you trip. What can I do in the kitchen?  Clean up any spills right away.  Avoid walking on wet floors.  Keep items that you use a lot in easy-to-reach places.  If you need to reach something above you, use a strong step stool that has  a grab bar.  Keep electrical cords out of the way.  Do not use floor polish or wax that makes floors slippery. If you must use wax, use non-skid floor wax.  Do not have throw rugs and other things on the floor that can make you trip. What can I do with my stairs?  Do not leave any items on the stairs.  Make sure that there are handrails on both sides of the stairs and use them. Fix handrails that are broken or loose. Make sure that handrails are as long as the stairways.  Check any carpeting to make sure that it is firmly attached to the stairs. Fix any carpet that is loose or worn.  Avoid having throw rugs at the top or bottom of the stairs. If you do have throw rugs, attach them to the floor with carpet tape.  Make sure that you have a light switch at the top of the stairs and the bottom of the stairs. If you do not have them, ask someone to add them for you. What  else can I do to help prevent falls?  Wear shoes that:  Do not have high heels.  Have rubber bottoms.  Are comfortable and fit you well.  Are closed at the toe. Do not wear sandals.  If you use a stepladder:  Make sure that it is fully opened. Do not climb a closed stepladder.  Make sure that both sides of the stepladder are locked into place.  Ask someone to hold it for you, if possible.  Clearly mark and make sure that you can see:  Any grab bars or handrails.  First and last steps.  Where the edge of each step is.  Use tools that help you move around (mobility aids) if they are needed. These include:  Canes.  Walkers.  Scooters.  Crutches.  Turn on the lights when you go into a dark area. Replace any light bulbs as soon as they burn out.  Set up your furniture so you have a clear path. Avoid moving your furniture around.  If any of your floors are uneven, fix them.  If there are any pets around you, be aware of where they are.  Review your medicines with your doctor. Some medicines can make you feel dizzy. This can increase your chance of falling. Ask your doctor what other things that you can do to help prevent falls. This information is not intended to replace advice given to you by your health care provider. Make sure you discuss any questions you have with your health care provider. Document Released: 10/10/2009 Document Revised: 05/21/2016 Document Reviewed: 01/18/2015 Elsevier Interactive Patient Education  2017 [employer].

## 2018-07-12 NOTE — Progress Notes (Signed)
Subjective:   Stacey Duncan is a 84 y.o. female who presents for Medicare Annual (Subsequent) preventive examination.  Review of Systems:   Cardiac Risk Factors include: advanced age (>61men, >74 women);hypertension;dyslipidemia     Objective:     Vitals: BP 102/62 (BP Location: Left Arm, Patient Position: Sitting)   Pulse 64   Temp 97.6 F (36.4 C) (Oral)   Resp 16   Ht 5\' 3"  (1.6 m)   Wt 149 lb 3.2 oz (67.7 kg)   BMI 26.43 kg/m   Body mass index is 26.43 kg/m.  Advanced Directives 07/12/2018 05/27/2018 10/15/2017 10/15/2017 07/20/2017 03/13/2016  Does Patient Have a Medical Advance Directive? No No No No No No  Would patient like information on creating a medical advance directive? Yes (MAU/Ambulatory/Procedural Areas - Information given) - Yes (Inpatient - patient requests chaplain consult to create a medical advance directive) - Yes (MAU/Ambulatory/Procedural Areas - Information given) No - patient declined information    Tobacco Social History   Tobacco Use  Smoking Status Never Smoker  Smokeless Tobacco Never Used     Counseling given: Not Answered   Clinical Intake:  Pre-visit preparation completed: Yes  Pain : 0-10 Pain Score: 8  Pain Type: Chronic pain Pain Location: Knee Pain Orientation: Right Pain Descriptors / Indicators: Nagging, Aching Pain Onset: More than a month ago Pain Frequency: Constant     Nutritional Status: BMI 25 -29 Overweight Nutritional Risks: None Diabetes: No  How often do you need to have someone help you when you read instructions, pamphlets, or other written materials from your doctor or pharmacy?: 1 - Never What is the last grade level you completed in school?: 4 years college   Interpreter Needed?: No  Information entered by :: Tiffany Hill,LPN   Past Medical History:  Diagnosis Date  . Fibromyalgia   . GERD (gastroesophageal reflux disease)   . HTN (hypertension)   . Hypothyroidism   . Osteoarthritis    a.  bilateral knees  . PAF (paroxysmal atrial fibrillation) (HCC)    a. on Eliquis;   Past Surgical History:  Procedure Laterality Date  . ABDOMINAL HYSTERECTOMY    . CHOLECYSTECTOMY    . ROTATOR CUFF REPAIR     left    Family History  Problem Relation Age of Onset  . Heart disease Mother 15       CABG  . Heart attack Maternal Grandfather   . Heart attack Maternal Grandmother    Social History   Socioeconomic History  . Marital status: Married    Spouse name: Not on file  . Number of children: Not on file  . Years of education: Not on file  . Highest education level: Bachelor's degree (e.g., BA, AB, BS)  Occupational History  . Not on file  Social Needs  . Financial resource strain: Not hard at all  . Food insecurity:    Worry: Never true    Inability: Never true  . Transportation needs:    Medical: No    Non-medical: No  Tobacco Use  . Smoking status: Never Smoker  . Smokeless tobacco: Never Used  Substance and Sexual Activity  . Alcohol use: Yes    Alcohol/week: 4.2 oz    Types: 7 Glasses of wine per week    Comment: glass of wine daily  . Drug use: No  . Sexual activity: Not on file  Lifestyle  . Physical activity:    Days per week: 0 days    Minutes  per session: 0 min  . Stress: Not at all  Relationships  . Social connections:    Talks on phone: More than three times a week    Gets together: More than three times a week    Attends religious service: More than 4 times per year    Active member of club or organization: No    Attends meetings of clubs or organizations: Never    Relationship status: Married  Other Topics Concern  . Not on file  Social History Narrative  . Not on file    Outpatient Encounter Medications as of 07/12/2018  Medication Sig  . Cholecalciferol (VITAMIN D-1000 MAX ST) 1000 UNITS tablet Take 1,000 Units by mouth daily.   Marland Kitchen ELIQUIS 5 MG TABS tablet TAKE ONE (1) TABLET BY MOUTH TWO (2) TIMES DAILY  . FLUoxetine (PROZAC) 40 MG  capsule Take 1 capsule (40 mg total) daily by mouth.  . hydrALAZINE (APRESOLINE) 25 MG tablet Take 1 tablet (25 mg total) by mouth 3 (three) times daily as needed (As needed for blood pressure greater than 160).  Marland Kitchen levothyroxine (SYNTHROID, LEVOTHROID) 100 MCG tablet TAKE 1 TABLET EVERY DAY  . losartan (COZAAR) 100 MG tablet TAKE 1 TABLET EVERY DAY  . metoprolol tartrate (LOPRESSOR) 25 MG tablet TAKES 1/2 TABLET TWICE A DAY  . simvastatin (ZOCOR) 40 MG tablet Take 1 tablet (40 mg total) by mouth at bedtime.  . cyclobenzaprine (FLEXERIL) 5 MG tablet Take 1 tablet (5 mg total) by mouth 3 (three) times daily as needed for muscle spasms. (Patient not taking: Reported on 07/12/2018)  . omeprazole (PRILOSEC) 40 MG capsule Take 1 capsule (40 mg total) by mouth daily. (Patient not taking: Reported on 07/12/2018)   No facility-administered encounter medications on file as of 07/12/2018.     Activities of Daily Living In your present state of health, do you have any difficulty performing the following activities: 07/12/2018 10/15/2017  Hearing? N N  Vision? N N  Difficulty concentrating or making decisions? N N  Walking or climbing stairs? Y N  Comment knee pain  -  Dressing or bathing? N N  Doing errands, shopping? N Y  Quarry manager and eating ? N -  Using the Toilet? N -  In the past six months, have you accidently leaked urine? N -  Do you have problems with loss of bowel control? N -  Managing your Medications? N -  Managing your Finances? N -  Housekeeping or managing your Housekeeping? N -  Some recent data might be hidden    Patient Care Team: Smitty Cords, DO as PCP - General (Family Medicine) Mariah Milling Tollie Pizza, MD as Consulting Physician (Cardiology) Riki Altes, MD (Urology) Juanell Fairly, MD as Referring Physician (Orthopedic Surgery)    Assessment:   This is a routine wellness examination for Stacey Duncan.  Exercise Activities and Dietary  recommendations Current Exercise Habits: The patient does not participate in regular exercise at present, Exercise limited by: None identified  Goals    . DIET - INCREASE WATER INTAKE     Recommend drinking at least 6-8 glasses of water a day        Fall Risk Fall Risk  07/12/2018 11/09/2017 07/20/2017 02/01/2017 10/22/2016  Falls in the past year? No Yes No No No  Number falls in past yr: - 2 or more - - -  Risk Factor Category  - High Fall Risk - - -  Risk for fall due to : -  History of fall(s) - - -   Is the patient's home free of loose throw rugs in walkways, pet beds, electrical cords, etc?   yes      Grab bars in the bathroom? no      Handrails on the stairs?   yes       Adequate lighting?   yes  Timed Get Up and Go performed: Completed in 8 seconds with no use of assistive devices, steady gait. No intervention needed at this time.   Depression Screen PHQ 2/9 Scores 07/12/2018 05/26/2018 02/24/2018 11/09/2017  PHQ - 2 Score 0 0 1 2  PHQ- 9 Score - 0 7 11     Cognitive Function     6CIT Screen 07/12/2018 07/20/2017  What Year? 0 points 0 points  What month? 0 points 0 points  What time? 0 points 0 points  Count back from 20 0 points 0 points  Months in reverse 0 points 0 points  Repeat phrase 0 points 0 points  Total Score 0 0    Immunization History  Administered Date(s) Administered  . Influenza, High Dose Seasonal PF 09/30/2015, 10/22/2016, 10/20/2017    Qualifies for Shingles Vaccine? Yes, discussed shingrix vaccine   Screening Tests Health Maintenance  Topic Date Due  . INFLUENZA VACCINE  07/28/2018  . TETANUS/TDAP  02/08/2025  . DEXA SCAN  Completed  . PNA vac Low Risk Adult  Completed    Cancer Screenings: Lung: Low Dose CT Chest recommended if Age 51-80 years, 30 pack-year currently smoking OR have quit w/in 15years. Patient does not qualify. Breast:  Up to date on Mammogram? Yes  07/08/2018 Up to date of Bone Density/Dexa? Yes 09/28/1999 Colorectal:  no longer required, last completed 12/28/2002  Additional Screenings:  Hepatitis C Screening: not indicated      Plan:    I have personally reviewed and addressed the Medicare Annual Wellness questionnaire and have noted the following in the patient's chart:  A. Medical and social history B. Use of alcohol, tobacco or illicit drugs  C. Current medications and supplements D. Functional ability and status E.  Nutritional status F.  Physical activity G. Advance directives H. List of other physicians I.  Hospitalizations, surgeries, and ER visits in previous 12 months J.  Vitals K. Screenings such as hearing and vision if needed, cognitive and depression L. Referrals and appointments   In addition, I have reviewed and discussed with patient certain preventive protocols, quality metrics, and best practice recommendations. A written personalized care plan for preventive services as well as general preventive health recommendations were provided to patient.   Signed,  Marin Robertsiffany Hill, LPN Nurse Health Advisor   Nurse Notes:none

## 2018-07-15 DIAGNOSIS — M5416 Radiculopathy, lumbar region: Secondary | ICD-10-CM | POA: Diagnosis not present

## 2018-07-15 DIAGNOSIS — M1711 Unilateral primary osteoarthritis, right knee: Secondary | ICD-10-CM | POA: Diagnosis not present

## 2018-08-08 DIAGNOSIS — M5416 Radiculopathy, lumbar region: Secondary | ICD-10-CM | POA: Diagnosis not present

## 2018-08-12 ENCOUNTER — Telehealth: Payer: Self-pay | Admitting: Cardiovascular Disease

## 2018-08-12 DIAGNOSIS — M5416 Radiculopathy, lumbar region: Secondary | ICD-10-CM | POA: Diagnosis not present

## 2018-08-12 NOTE — Telephone Encounter (Signed)
   [facility] Pre-operative Risk Assessment    Request for surgical clearance:  1. What type of surgery is being performed? Lumbar ESI  2. When is this surgery scheduled? Not listed  3. What type of clearance is required (medical clearance vs. Pharmacy clearance to hold med vs. Both)? pharmacy  4. Are there any medications that need to be held prior to surgery and how long? Eliquis  Practice name and name of physician performing surgery?  [facility]  5. What is your office phone number [phone]    7.   What is your office fax number [phone]  8.   Anesthesia type (None, local, MAC, general) ? Not lised   Lucienne Minks 08/12/2018, 2:54 PM  _________________________________________________________________   (provider comments below)

## 2018-08-13 NOTE — Telephone Encounter (Signed)
Acceptable risk to hold anticoagulation eliquis 3 days (should these be going to anticoag clinic?)

## 2018-08-13 NOTE — Telephone Encounter (Signed)
Acceptable risk to hold anticoagulation eliquis 3 days (should these be going to anticoag clinic?) 

## 2018-08-15 NOTE — Telephone Encounter (Signed)
Routed to number provided via EPIC fax.  

## 2018-08-31 DIAGNOSIS — M5416 Radiculopathy, lumbar region: Secondary | ICD-10-CM | POA: Diagnosis not present

## 2018-09-12 DIAGNOSIS — M5416 Radiculopathy, lumbar region: Secondary | ICD-10-CM | POA: Diagnosis not present

## 2018-09-16 ENCOUNTER — Other Ambulatory Visit: Payer: Self-pay | Admitting: Cardiovascular Disease

## 2018-09-19 NOTE — Telephone Encounter (Signed)
Refill Request.  

## 2018-09-20 DIAGNOSIS — M1711 Unilateral primary osteoarthritis, right knee: Secondary | ICD-10-CM | POA: Diagnosis not present

## 2018-09-27 DIAGNOSIS — M1711 Unilateral primary osteoarthritis, right knee: Secondary | ICD-10-CM | POA: Diagnosis not present

## 2018-09-27 DIAGNOSIS — M545 Low back pain: Secondary | ICD-10-CM | POA: Diagnosis not present

## 2018-10-05 DIAGNOSIS — M545 Low back pain: Secondary | ICD-10-CM | POA: Diagnosis not present

## 2018-10-05 DIAGNOSIS — M1711 Unilateral primary osteoarthritis, right knee: Secondary | ICD-10-CM | POA: Diagnosis not present

## 2018-10-07 DIAGNOSIS — M545 Low back pain: Secondary | ICD-10-CM | POA: Diagnosis not present

## 2018-10-07 DIAGNOSIS — M1711 Unilateral primary osteoarthritis, right knee: Secondary | ICD-10-CM | POA: Diagnosis not present

## 2018-10-17 DIAGNOSIS — M545 Low back pain: Secondary | ICD-10-CM | POA: Diagnosis not present

## 2018-10-17 DIAGNOSIS — M1711 Unilateral primary osteoarthritis, right knee: Secondary | ICD-10-CM | POA: Diagnosis not present

## 2018-10-20 DIAGNOSIS — M545 Low back pain: Secondary | ICD-10-CM | POA: Diagnosis not present

## 2018-10-20 DIAGNOSIS — M1711 Unilateral primary osteoarthritis, right knee: Secondary | ICD-10-CM | POA: Diagnosis not present

## 2018-10-26 ENCOUNTER — Ambulatory Visit (INDEPENDENT_AMBULATORY_CARE_PROVIDER_SITE_OTHER): Payer: Medicare PPO

## 2018-10-26 DIAGNOSIS — M545 Low back pain: Secondary | ICD-10-CM | POA: Diagnosis not present

## 2018-10-26 DIAGNOSIS — Z23 Encounter for immunization: Secondary | ICD-10-CM

## 2018-10-26 DIAGNOSIS — M1711 Unilateral primary osteoarthritis, right knee: Secondary | ICD-10-CM | POA: Diagnosis not present

## 2018-10-26 NOTE — Patient Instructions (Signed)

## 2018-10-31 DIAGNOSIS — M1711 Unilateral primary osteoarthritis, right knee: Secondary | ICD-10-CM | POA: Diagnosis not present

## 2018-10-31 DIAGNOSIS — M545 Low back pain: Secondary | ICD-10-CM | POA: Diagnosis not present

## 2018-11-03 DIAGNOSIS — M1711 Unilateral primary osteoarthritis, right knee: Secondary | ICD-10-CM | POA: Diagnosis not present

## 2018-11-03 DIAGNOSIS — M545 Low back pain: Secondary | ICD-10-CM | POA: Diagnosis not present

## 2018-11-09 DIAGNOSIS — M5416 Radiculopathy, lumbar region: Secondary | ICD-10-CM | POA: Diagnosis not present

## 2018-11-11 ENCOUNTER — Ambulatory Visit: Payer: Medicare PPO | Admitting: Family Medicine

## 2018-11-11 ENCOUNTER — Encounter: Payer: Self-pay | Admitting: Family Medicine

## 2018-11-11 VITALS — BP 124/63 | HR 66 | Temp 98.4°F | Resp 16 | Ht 63.0 in | Wt 147.0 lb

## 2018-11-11 DIAGNOSIS — N3000 Acute cystitis without hematuria: Secondary | ICD-10-CM

## 2018-11-11 DIAGNOSIS — N390 Urinary tract infection, site not specified: Secondary | ICD-10-CM | POA: Diagnosis not present

## 2018-11-11 LAB — POCT URINALYSIS DIPSTICK
BILIRUBIN UA: NEGATIVE
GLUCOSE UA: NEGATIVE
KETONES UA: NEGATIVE
Nitrite, UA: POSITIVE
PH UA: 5 (ref 5.0–8.0)
Protein, UA: POSITIVE — AB
SPEC GRAV UA: 1.01 (ref 1.010–1.025)
Urobilinogen, UA: 2 E.U./dL — AB

## 2018-11-11 MED ORDER — CEPHALEXIN 500 MG PO CAPS: 500.0000 mg | ORAL_CAPSULE | Freq: Three times a day (TID) | ORAL | 0 refills | Status: DC

## 2018-11-11 NOTE — Patient Instructions (Addendum)
Thank you for coming to the office today.  1. You have a Urinary Tract Infection - this is very common, your symptoms are reassuring and you should get better within 1 week on the antibiotics - Start Keflex 500mg  3 times daily for next 7 days, complete entire course, even if feeling better - We sent urine for a culture, we will call you within next few days if we need to change antibiotics - Please drink plenty of fluids, improve hydration over next 1 week  If symptoms worsening, developing nausea / vomiting, worsening back pain, fevers / chills / sweats, then please return for re-evaluation sooner.  To prevent bladder and kidney infections...  Wipe front to back after using the restroom  Drink enough water to keep your pee clear to pale yellow  If you think you are getting another bladder infection, start drinking cranberry juice and come see us so we can check the urine.   Please schedule a Follow-up Appointment to: Return in about 1 week (around 11/18/2018), or if symptoms worsen or fail to improve, for UTI.  If you have any other questions or concerns, please feel free to call the office or send a message through MyChart. You may also schedule an earlier appointment if necessary.  Additionally, you may be receiving a survey about your experience at our office within a few days to 1 week by e-mail or mail. We value your feedback.  Saralyn PilarAlexander Kimyata Milich, DO [facility]

## 2018-11-11 NOTE — Progress Notes (Signed)
Subjective:    Patient ID: Stacey Duncan, female    DOB: [birth year], 84 y.o.   MRN: [id number]  Stacey Duncan is a 84 y.o. female presenting on 11/11/2018 for Urinary Tract Infection (onset pain and burning and urgency onset yesterday as per patient Phenazopyridine HCL 2 tab taken yesterday)  Patient presents for a same day appointment.  HPI   UTI, Urinary Frequency / Dysuria  Reports symptoms started yesterday, with urinary frequency, dysuria each void, without known acute trigger. Prior history of UTI in past years ago. She has seen Urology in past. History of nephrolithiasis, but now not endorsing these symptoms. She took AZO old rx for few doses with temporary relief, her bottle was from 2017. No recent antibiotics. - history of sulfa allergy - Denies nausea vomiting, fever chills, flank or back pain   Depression screen [facility] 2/9 11/11/2018 07/12/2018 05/26/2018  Decreased Interest 0 0 0  Down, Depressed, Hopeless 0 0 0  PHQ - 2 Score 0 0 0  Altered sleeping - - 0  Tired, decreased energy - - 0  Change in appetite - - 0  Feeling bad or failure about yourself  - - 0  Trouble concentrating - - 0  Moving slowly or fidgety/restless - - 0  Suicidal thoughts - - 0  PHQ-9 Score - - 0  Difficult doing work/chores - - Not difficult at all    Social History   Tobacco Use  . Smoking status: Never Smoker  . Smokeless tobacco: Never Used  Substance Use Topics  . Alcohol use: Yes    Alcohol/week: 7.0 standard drinks    Types: 7 Glasses of wine per week    Comment: glass of wine daily  . Drug use: No    Review of Systems Per HPI unless specifically indicated above     Objective:    BP 124/63   Pulse 66   Temp 98.4 F (36.9 C) (Oral)   Resp 16   Ht 5\' 3"  (1.6 m)   Wt 147 lb (66.7 kg)   BMI 26.04 kg/m   Wt Readings from Last 3 Encounters:  11/11/18 147 lb (66.7 kg)  07/12/18 149 lb 3.2 oz (67.7 kg)  05/27/18 150 lb (68 kg)    Physical Exam  Constitutional:  She is oriented to person, place, and time. She appears well-developed and well-nourished. No distress.  Well-appearing, comfortable, cooperative  HENT:  Head: Normocephalic and atraumatic.  Mouth/Throat: Oropharynx is clear and moist.  Eyes: Conjunctivae are normal. Right eye exhibits no discharge. Left eye exhibits no discharge.  Cardiovascular: Normal rate.  Pulmonary/Chest: Effort normal.  Abdominal: Soft. Bowel sounds are normal. She exhibits no distension. There is no tenderness.  No distended bladder  Musculoskeletal: She exhibits no edema.  No CVAT  Neurological: She is alert and oriented to person, place, and time.  Skin: Skin is warm and dry. No rash noted. She is not diaphoretic. No erythema.  Psychiatric: She has a normal mood and affect. Her behavior is normal.  Well groomed, good eye contact, normal speech and thoughts  Nursing note and vitals reviewed.  Results for orders placed or performed in visit on 11/11/18  POCT Urinalysis Dipstick  Result Value Ref Range   Color, UA dark amber    Clarity, UA clear    Glucose, UA Negative Negative   Bilirubin, UA Negative    Ketones, UA Negative    Spec Grav, UA 1.010 1.010 - 1.025   Blood, UA large  pH, UA 5.0 5.0 - 8.0   Protein, UA Positive (A) Negative   Urobilinogen, UA 2.0 (A) 0.2 or 1.0 E.U./dL   Nitrite, UA positive    Leukocytes, UA Large (3+) (A) Negative   Appearance clear    Odor none       Assessment & Plan:   Problem List Items Addressed This Visit    None    Visit Diagnoses    Acute cystitis without hematuria    -  Primary   Relevant Medications   cephALEXin (KEFLEX) 500 MG capsule   Other Relevant Orders   POCT Urinalysis Dipstick (Completed)   Urine Culture      Clinically consistent with UTI and confirmed on UA. No recent UTIs or abx courses.  No concern for pyelo today (no systemic symptoms, neg fever, back pain, n/v).  Plan: 1. UA abnormal, also on AZO - Advised to limit AZO to 1-3  days max, prefer to avoid if possible, can take OTC if need new med instead of old 2017 bottle rx. 2. Ordered Urine culture 3. Start Keflex 500mg  TID x 7 days 4. Improve PO hydration 5. RTC if no improvement 1-2 weeks, red flags given to return sooner   Meds ordered this encounter  Medications  . cephALEXin (KEFLEX) 500 MG capsule    Sig: Take 1 capsule (500 mg total) by mouth 3 (three) times daily. For 7 days    Dispense:  21 capsule    Refill:  0      Follow up plan: Return in about 1 week (around 11/18/2018), or if symptoms worsen or fail to improve, for UTI.  Saralyn PilarAlexander Leno Mathes, DO [facility] 11/11/2018, 5:23 PM

## 2018-11-13 LAB — URINE CULTURE
MICRO NUMBER: 91378968
SPECIMEN QUALITY:: ADEQUATE

## 2018-11-18 DIAGNOSIS — M545 Low back pain: Secondary | ICD-10-CM | POA: Diagnosis not present

## 2018-11-18 DIAGNOSIS — M1711 Unilateral primary osteoarthritis, right knee: Secondary | ICD-10-CM | POA: Diagnosis not present

## 2018-11-21 ENCOUNTER — Other Ambulatory Visit: Payer: Medicare PPO

## 2018-11-28 ENCOUNTER — Ambulatory Visit: Payer: Medicare PPO | Admitting: Family Medicine

## 2018-11-29 ENCOUNTER — Other Ambulatory Visit: Payer: Medicare PPO

## 2018-11-29 DIAGNOSIS — R7303 Prediabetes: Secondary | ICD-10-CM | POA: Diagnosis not present

## 2018-11-29 DIAGNOSIS — E890 Postprocedural hypothyroidism: Secondary | ICD-10-CM | POA: Diagnosis not present

## 2018-11-30 DIAGNOSIS — M1711 Unilateral primary osteoarthritis, right knee: Secondary | ICD-10-CM | POA: Diagnosis not present

## 2018-11-30 DIAGNOSIS — M545 Low back pain: Secondary | ICD-10-CM | POA: Diagnosis not present

## 2018-11-30 LAB — HEMOGLOBIN A1C
EAG (MMOL/L): 6.3 (calc)
Hgb A1c MFr Bld: 5.6 % of total Hgb (ref <5.7)
MEAN PLASMA GLUCOSE: 114 (calc)

## 2018-11-30 LAB — TSH: TSH: 1.17 m[IU]/L (ref 0.40–4.50)

## 2018-11-30 LAB — T4, FREE: FREE T4: 1.5 ng/dL (ref 0.8–1.8)

## 2018-12-02 ENCOUNTER — Ambulatory Visit: Payer: Medicare PPO | Admitting: Family Medicine

## 2018-12-02 ENCOUNTER — Encounter: Payer: Self-pay | Admitting: Family Medicine

## 2018-12-02 VITALS — BP 120/49 | HR 80 | Temp 97.9°F | Resp 16 | Ht 63.0 in | Wt 147.0 lb

## 2018-12-02 DIAGNOSIS — R7303 Prediabetes: Secondary | ICD-10-CM | POA: Diagnosis not present

## 2018-12-02 DIAGNOSIS — M797 Fibromyalgia: Secondary | ICD-10-CM

## 2018-12-02 DIAGNOSIS — E032 Hypothyroidism due to medicaments and other exogenous substances: Secondary | ICD-10-CM | POA: Diagnosis not present

## 2018-12-02 NOTE — Progress Notes (Signed)
Subjective:    Patient ID: Stacey Duncan, female    DOB: 07/13/1931, 84 y.o.   MRN: [id number]  Stacey Duncan is a 84 y.o. female presenting on 12/02/2018 for Hypothyroidism   HPI   Pre-Diabetes Previous history A1c 5.7 to 6.1 - recent lab showed improvement to A1c 5.6 CBG not checking Meds: none Currently on ARB Diet: improved, less sweets Exercise: limited activity due to pain and fibromyalgia, increased fatigue Denies hypoglycemia, urinary frequency (resolved UTI back in 10/2018)  Hypothyroidism Last TSH lab showed normal 1.17, previously mild elevated TSH 5.06 to 5.8 Doing well, continues on Levothyroxine daily  Fibromyalgia Chronic pain symptoms multiple areas of body, seems to have episodic flare up  Depression screen [facility] 2/9 12/02/2018 11/11/2018 07/12/2018  Decreased Interest 0 0 0  Down, Depressed, Hopeless 0 0 0  PHQ - 2 Score 0 0 0  Altered sleeping 0 - -  Tired, decreased energy 0 - -  Change in appetite 0 - -  Feeling bad or failure about yourself  0 - -  Trouble concentrating 0 - -  Moving slowly or fidgety/restless 0 - -  Suicidal thoughts 0 - -  PHQ-9 Score 0 - -  Difficult doing work/chores Not difficult at all - -    Social History   Tobacco Use  . Smoking status: Never Smoker  . Smokeless tobacco: Never Used  Substance Use Topics  . Alcohol use: Yes    Alcohol/week: 7.0 standard drinks    Types: 7 Glasses of wine per week    Comment: glass of wine daily  . Drug use: No    Review of Systems Per HPI unless specifically indicated above     Objective:    BP (!) 120/49   Pulse 80   Temp 97.9 F (36.6 C) (Oral)   Resp 16   Ht 5\' 3"  (1.6 m)   Wt 147 lb (66.7 kg)   BMI 26.04 kg/m   Wt Readings from Last 3 Encounters:  12/02/18 147 lb (66.7 kg)  11/11/18 147 lb (66.7 kg)  07/12/18 149 lb 3.2 oz (67.7 kg)    Physical Exam  Constitutional: She is oriented to person, place, and time. She appears well-developed and  well-nourished. No distress.  Well-appearing, comfortable, cooperative  HENT:  Head: Normocephalic and atraumatic.  Mouth/Throat: Oropharynx is clear and moist.  Eyes: Conjunctivae are normal. Right eye exhibits no discharge. Left eye exhibits no discharge.  Neck: Normal range of motion. Neck supple. No thyromegaly present.  Cardiovascular: Normal rate, regular rhythm, normal heart sounds and intact distal pulses.  No murmur heard. Pulmonary/Chest: Effort normal and breath sounds normal. No respiratory distress. She has no wheezes. She has no rales.  Musculoskeletal: Normal range of motion. She exhibits no edema.  Lymphadenopathy:    She has no cervical adenopathy.  Neurological: She is alert and oriented to person, place, and time.  Skin: Skin is warm and dry. No rash noted. She is not diaphoretic. No erythema.  Psychiatric: She has a normal mood and affect. Her behavior is normal.  Well groomed, good eye contact, normal speech and thoughts  Nursing note and vitals reviewed.  Results for orders placed or performed in visit on 11/21/18  T4, free  Result Value Ref Range   Free T4 1.5 0.8 - 1.8 ng/dL  TSH  Result Value Ref Range   TSH 1.17 0.40 - 4.50 mIU/L  Hemoglobin A1c  Result Value Ref Range   Hgb A1c  MFr Bld 5.6 <5.7 % of total Hgb   Mean Plasma Glucose 114 (calc)   eAG (mmol/L) 6.3 (calc)      Assessment & Plan:   Problem List Items Addressed This Visit    Fibromyalgia    Persistent chronic problem Recent flare generalized pain MSK Continue on Gabapentin - may adjust dose      Hypothyroidism - Primary    Normalized TSH Continue Levothyroxine 100mcg daily Re-check in 6 months      Pre-diabetes    Well-controlled Pre-DM with A1c improved to 5.6  Plan:  1. Not on any therapy currently  2. Encourage improved lifestyle - low carb, low sugar diet, reduce portion size, continue improving regular exercise as tolerated 3. Follow-up q 6 mo annual w/ labs           No orders of the defined types were placed in this encounter.  Follow up plan: Return in about 6 months (around 06/03/2019) for Annual Physical.  Future labs ordered for 05/29/19  Saralyn PilarAlexander , DO [facility] 12/02/2018, 2:24 PM

## 2018-12-02 NOTE — Patient Instructions (Addendum)
Thank you for coming to the office today.  Recent Labs    05/24/18 0748 11/29/18 0904  HGBA1C 5.7* 5.6    Sugar improved A1c 5.6, keep up good work limiting sweets  ---- Thyroid result is normal. Continue current dose Levothyroxine 100mcg daily - notify us if need new rx refill  DUE for FASTING BLOOD WORK (no food or drink after midnight before the lab appointment, only water or coffee without cream/sugar on the morning of)  SCHEDULE "Lab Only" visit in the morning at the clinic for lab draw in 6 MONTHS   - Make sure Lab Only appointment is at about 1 week before your next appointment, so that results will be available  For Lab Results, once available within 2-3 days of blood draw, you can can log in to MyChart online to view your results and a brief explanation. Also, we can discuss results at next follow-up visit.  --- Keep slowly increasing Gabapentin   May take 3 gabapetin for 300mg  nightly for now - and in future either consider add additional at night or 1 in AM and 1 in afternoon and increase that way - discuss higher doses with your Ortho - In the future if needed, we can significantly increase the dose if tolerated well, some common doses are 300mg  three times a day up to 600mg  three times a day, usually it takes several weeks or months to get to higher doses   Please schedule a Follow-up Appointment to: Return in about 6 months (around 06/03/2019) for Annual Physical.  If you have any other questions or concerns, please feel free to call the office or send a message through MyChart. You may also schedule an earlier appointment if necessary.  Additionally, you may be receiving a survey about your experience at our office within a few days to 1 week by e-mail or mail. We value your feedback.  Saralyn PilarAlexander Wylene Weissman, DO [facility]

## 2018-12-03 ENCOUNTER — Encounter: Payer: Self-pay | Admitting: Family Medicine

## 2018-12-04 ENCOUNTER — Other Ambulatory Visit: Payer: Self-pay | Admitting: Family Medicine

## 2018-12-04 ENCOUNTER — Encounter: Payer: Self-pay | Admitting: Family Medicine

## 2018-12-04 DIAGNOSIS — I10 Essential (primary) hypertension: Secondary | ICD-10-CM

## 2018-12-04 DIAGNOSIS — M797 Fibromyalgia: Secondary | ICD-10-CM

## 2018-12-04 DIAGNOSIS — R7303 Prediabetes: Secondary | ICD-10-CM

## 2018-12-04 DIAGNOSIS — F339 Major depressive disorder, recurrent, unspecified: Secondary | ICD-10-CM

## 2018-12-04 DIAGNOSIS — E782 Mixed hyperlipidemia: Secondary | ICD-10-CM

## 2018-12-04 DIAGNOSIS — E032 Hypothyroidism due to medicaments and other exogenous substances: Secondary | ICD-10-CM

## 2018-12-04 DIAGNOSIS — I480 Paroxysmal atrial fibrillation: Secondary | ICD-10-CM

## 2018-12-04 DIAGNOSIS — K219 Gastro-esophageal reflux disease without esophagitis: Secondary | ICD-10-CM

## 2018-12-04 DIAGNOSIS — Z0000 Encounter for general adult medical examination without abnormal findings: Secondary | ICD-10-CM

## 2018-12-04 DIAGNOSIS — R5382 Chronic fatigue, unspecified: Secondary | ICD-10-CM

## 2018-12-04 NOTE — Assessment & Plan Note (Addendum)
Normalized TSH Continue Levothyroxine daily Re-check in 6 months

## 2018-12-04 NOTE — Assessment & Plan Note (Signed)
Well-controlled Pre-DM with A1c improved to 5.6  Plan:  1. Not on any therapy currently  2. Encourage improved lifestyle - low carb, low sugar diet, reduce portion size, continue improving regular exercise as tolerated 3. Follow-up q 6 mo annual w/ labs

## 2018-12-04 NOTE — Assessment & Plan Note (Signed)
Persistent chronic problem Recent flare generalized pain MSK Continue on Gabapentin - may adjust dose

## 2018-12-07 ENCOUNTER — Other Ambulatory Visit: Payer: Self-pay | Admitting: Cardiovascular Disease

## 2018-12-07 DIAGNOSIS — M5416 Radiculopathy, lumbar region: Secondary | ICD-10-CM | POA: Diagnosis not present

## 2018-12-07 DIAGNOSIS — M1711 Unilateral primary osteoarthritis, right knee: Secondary | ICD-10-CM | POA: Diagnosis not present

## 2018-12-07 DIAGNOSIS — M545 Low back pain: Secondary | ICD-10-CM | POA: Diagnosis not present

## 2018-12-07 NOTE — Telephone Encounter (Signed)
Refill Request.  

## 2018-12-16 ENCOUNTER — Encounter: Payer: Self-pay | Admitting: Nurse Practitioner

## 2018-12-16 ENCOUNTER — Other Ambulatory Visit: Payer: Self-pay

## 2018-12-16 ENCOUNTER — Ambulatory Visit (INDEPENDENT_AMBULATORY_CARE_PROVIDER_SITE_OTHER): Payer: Medicare PPO | Admitting: Nurse Practitioner

## 2018-12-16 VITALS — BP 106/44 | HR 79 | Temp 98.2°F | Ht 63.0 in | Wt 145.0 lb

## 2018-12-16 DIAGNOSIS — N3001 Acute cystitis with hematuria: Secondary | ICD-10-CM

## 2018-12-16 DIAGNOSIS — R300 Dysuria: Secondary | ICD-10-CM

## 2018-12-16 LAB — POCT URINALYSIS DIPSTICK
Bilirubin, UA: NEGATIVE
Glucose, UA: NEGATIVE
Ketones, UA: NEGATIVE
Nitrite, UA: NEGATIVE
Protein, UA: NEGATIVE
Spec Grav, UA: 1.02 (ref 1.010–1.025)
Urobilinogen, UA: 0.2 E.U./dL
pH, UA: 5 (ref 5.0–8.0)

## 2018-12-16 MED ORDER — CEPHALEXIN 500 MG PO CAPS: 500.0000 mg | ORAL_CAPSULE | Freq: Three times a day (TID) | ORAL | 0 refills | Status: AC

## 2018-12-16 NOTE — Progress Notes (Signed)
Subjective:    Patient ID: Stacey Duncan, female    DOB: 21-Jul-1931, 84 y.o.   MRN: [id number]  Stacey Duncan is a 84 y.o. female presenting on 12/16/2018 for Dysuria (urinary frequency, w/ difficulty voiding x 2days)   HPI Dysuria Patient with dysuria symptoms x 2 days.  Current symptoms started Wed night in middle of night with a "feeling" that when urinate her "bladder was turning wrong side out."  Could not void well.  Thursday drank lots of water with some relief.  Completely emptied bladder that day.  Was having urinary urgency prior to that.  Then returned through the night last night with 6x urination.  Felt incomplete emptying the last time and continued today.  Patient had recent UTI 11/11/2018 treated by PCP Dr. Althea CharonKaramalegos with Keflex tid x 5 days.  No other UTIs for many years pre patient report.   Social History   Tobacco Use  . Smoking status: Never Smoker  . Smokeless tobacco: Never Used  Substance Use Topics  . Alcohol use: Yes    Alcohol/week: 7.0 standard drinks    Types: 7 Glasses of wine per week    Comment: glass of wine daily  . Drug use: No    Review of Systems Per HPI unless specifically indicated above     Objective:    BP (!) 106/44 (BP Location: Left Arm, Patient Position: Sitting, Cuff Size: Normal)   Pulse 79   Temp 98.2 F (36.8 C) (Oral)   Ht 5\' 3"  (1.6 m)   Wt 145 lb (65.8 kg)   BMI 25.69 kg/m   Wt Readings from Last 3 Encounters:  12/16/18 145 lb (65.8 kg)  12/02/18 147 lb (66.7 kg)  11/11/18 147 lb (66.7 kg)    Physical Exam Vitals signs reviewed.  Constitutional:      General: She is not in acute distress.    Appearance: She is well-developed.  HENT:     Head: Normocephalic and atraumatic.  Cardiovascular:     Rate and Rhythm: Normal rate and regular rhythm.     Pulses:          Radial pulses are 2+ on the right side and 2+ on the left side.       Posterior tibial pulses are 1+ on the right side and 1+ on the left  side.     Heart sounds: Normal heart sounds, S1 normal and S2 normal.  Pulmonary:     Effort: Pulmonary effort is normal. No respiratory distress.     Breath sounds: Normal breath sounds and air entry.  Abdominal:     General: Bowel sounds are normal. There is no distension.     Palpations: Abdomen is soft.     Tenderness: There is abdominal tenderness in the suprapubic area. There is no right CVA tenderness or left CVA tenderness.     Hernia: No hernia is present.  Musculoskeletal:     Right lower leg: No edema.     Left lower leg: No edema.  Skin:    General: Skin is warm and dry.     Capillary Refill: Capillary refill takes less than 2 seconds.  Neurological:     Mental Status: She is alert and oriented to person, place, and time.  Psychiatric:        Attention and Perception: Attention normal.        Mood and Affect: Mood and affect normal.        Behavior: Behavior normal.  Behavior is cooperative.     Results for orders placed or performed in visit on 12/16/18  POCT Urinalysis Dipstick  Result Value Ref Range   Color, UA yellow    Clarity, UA clear    Glucose, UA Negative Negative   Bilirubin, UA negative    Ketones, UA negative    Spec Grav, UA 1.020 1.010 - 1.025   Blood, UA moderate    pH, UA 5.0 5.0 - 8.0   Protein, UA Negative Negative   Urobilinogen, UA 0.2 0.2 or 1.0 E.U./dL   Nitrite, UA negative    Leukocytes, UA Moderate (2+) (A) Negative   Appearance     Odor        Assessment & Plan:   Problem List Items Addressed This Visit    None    Visit Diagnoses    Acute cystitis with hematuria    -  Primary   Relevant Medications   cephALEXin (KEFLEX) 500 MG capsule   Other Relevant Orders   Urine Culture   Dysuria       Relevant Orders   POCT Urinalysis Dipstick (Completed)    Acute cystitis with hematuria.  Pt symptomatic currently with increased suprapubic pressure x 2 days. Currently without systemic signs or symptoms of infection.   - No current  risk of concurrent STI.  Plan: 1. START Keflex 500mg  3 times daily for next 7 days.  Extended course of treatment due to recent use of same abx. - Send Urine culture  2. Provided non-pharm measures for UTI prevention for good hygiene. 3. Drink plenty of fluids and improve hydration over next 1 week. 4. Provided precautions for severe symptoms requiring ED visit to include no urine in 24-48 hours. 5. Followup 2-5 days as needed for worsening or persistent symptoms.    Meds ordered this encounter  Medications  . cephALEXin (KEFLEX) 500 MG capsule    Sig: Take 1 capsule (500 mg total) by mouth 3 (three) times daily for 7 days.    Dispense:  21 capsule    Refill:  0    Order Specific Question:   Supervising Provider    Answer:   Smitty CordsKARAMALEGOS, ALEXANDER J [[id number]]    Follow up plan: Return in 5-7 days if symptoms worsen or fail to improve.  Wilhelmina McardleLauren Theola Cuellar, DNP, AGPCNP-BC Adult Gerontology Primary Care Nurse Practitioner [facility] 12/16/2018, 10:56 AM

## 2018-12-16 NOTE — Patient Instructions (Addendum)
Stacey LennertPatricia J Duncan,   Thank you for coming in to clinic today.  1. REPEAT Keflex.  Take one 500 mg tablet three times daily for 7 days  2. Keep drinking plenty of water.  3. May take AZO for pain relief as needed for next 2-3 days or if a little discomfort remains after antibiotic.   Please schedule a follow-up appointment with Stacey McardleLauren Cheney Duncan, AGNP. Return in 5-7 days if symptoms worsen or fail to improve.  If you have any other questions or concerns, please feel free to call the clinic or send a message through MyChart. You may also schedule an earlier appointment if necessary.  You will receive a survey after today's visit either digitally by e-mail or paper by [employer] mail. Your experiences and feedback matter to us.  Please respond so we know how we are doing as we provide care for you.   Stacey McardleLauren Stacey Tuite, DNP, AGNP-BC Adult Gerontology Nurse Practitioner [facility], [facility]   Urinary Tract Infection, Adult  A urinary tract infection (UTI) is an infection of any part of the urinary tract. The urinary tract includes the kidneys, ureters, bladder, and urethra. These organs make, store, and get rid of urine in the body. Your health care provider may use other names to describe the infection. An upper UTI affects the ureters and kidneys (pyelonephritis). A lower UTI affects the bladder (cystitis) and urethra (urethritis). What are the causes? Most urinary tract infections are caused by bacteria in your genital area, around the entrance to your urinary tract (urethra). These bacteria grow and cause inflammation of your urinary tract. What increases the risk? You are more likely to develop this condition if:  You have a urinary catheter that stays in place (indwelling).  You are not able to control when you urinate or have a bowel movement (you have incontinence).  You are female and you: ? Use a spermicide or diaphragm for birth control. ? Have low estrogen levels. ? Are  pregnant.  You have certain genes that increase your risk (genetics).  You are sexually active.  You take antibiotic medicines.  You have a condition that causes your flow of urine to slow down, such as: ? An enlarged prostate, if you are female. ? Blockage in your urethra (stricture). ? A kidney stone. ? A nerve condition that affects your bladder control (neurogenic bladder). ? Not getting enough to drink, or not urinating often.  You have certain medical conditions, such as: ? Diabetes. ? A weak disease-fighting system (immunesystem). ? Sickle cell disease. ? Gout. ? Spinal cord injury. What are the signs or symptoms? Symptoms of this condition include:  Needing to urinate right away (urgently).  Frequent urination or passing small amounts of urine frequently.  Pain or burning with urination.  Blood in the urine.  Urine that smells bad or unusual.  Trouble urinating.  Cloudy urine.  Vaginal discharge, if you are female.  Pain in the abdomen or the lower back. You may also have:  Vomiting or a decreased appetite.  Confusion.  Irritability or tiredness.  A fever.  Diarrhea. The first symptom in older adults may be confusion. In some cases, they may not have any symptoms until the infection has worsened. How is this diagnosed? This condition is diagnosed based on your medical history and a physical exam. You may also have other tests, including:  Urine tests.  Blood tests.  Tests for sexually transmitted infections (STIs). If you have had more than one UTI,  a cystoscopy or imaging studies may be done to determine the cause of the infections. How is this treated? Treatment for this condition includes:  Antibiotic medicine.  Over-the-counter medicines to treat discomfort.  Drinking enough water to stay hydrated. If you have frequent infections or have other conditions such as a kidney stone, you may need to see a health care provider who specializes  in the urinary tract (urologist). In rare cases, urinary tract infections can cause sepsis. Sepsis is a life-threatening condition that occurs when the body responds to an infection. Sepsis is treated in the hospital with IV antibiotics, fluids, and other medicines. Follow these instructions at home:  Medicines  Take over-the-counter and prescription medicines only as told by your health care provider.  If you were prescribed an antibiotic medicine, take it as told by your health care provider. Do not stop using the antibiotic even if you start to feel better. General instructions  Make sure you: ? Empty your bladder often and completely. Do not hold urine for long periods of time. ? Empty your bladder after sex. ? Wipe from front to back after a bowel movement if you are female. Use each tissue one time when you wipe.  Drink enough fluid to keep your urine pale yellow.  Keep all follow-up visits as told by your health care provider. This is important. Contact a health care provider if:  Your symptoms do not get better after 1-2 days.  Your symptoms go away and then return. Get help right away if you have:  Severe pain in your back or your lower abdomen.  A fever.  Nausea or vomiting. Summary  A urinary tract infection (UTI) is an infection of any part of the urinary tract, which includes the kidneys, ureters, bladder, and urethra.  Most urinary tract infections are caused by bacteria in your genital area, around the entrance to your urinary tract (urethra).  Treatment for this condition often includes antibiotic medicines.  If you were prescribed an antibiotic medicine, take it as told by your health care provider. Do not stop using the antibiotic even if you start to feel better.  Keep all follow-up visits as told by your health care provider. This is important. This information is not intended to replace advice given to you by your health care provider. Make sure you  discuss any questions you have with your health care provider. Document Released: 09/23/2005 Document Revised: 06/23/2018 Document Reviewed: 06/23/2018 Elsevier Interactive Patient Education  2019 [employer]

## 2018-12-18 ENCOUNTER — Encounter: Payer: Self-pay | Admitting: Nurse Practitioner

## 2018-12-18 LAB — URINE CULTURE
MICRO NUMBER:: 91527281
SPECIMEN QUALITY:: ADEQUATE

## 2018-12-26 ENCOUNTER — Encounter: Payer: Self-pay | Admitting: Nurse Practitioner

## 2018-12-26 ENCOUNTER — Ambulatory Visit (INDEPENDENT_AMBULATORY_CARE_PROVIDER_SITE_OTHER): Payer: Medicare PPO | Admitting: Nurse Practitioner

## 2018-12-26 ENCOUNTER — Other Ambulatory Visit: Payer: Self-pay

## 2018-12-26 ENCOUNTER — Telehealth: Payer: Self-pay | Admitting: Nurse Practitioner

## 2018-12-26 VITALS — BP 123/62 | HR 60 | Temp 98.0°F | Ht 63.0 in | Wt 146.8 lb

## 2018-12-26 DIAGNOSIS — N3001 Acute cystitis with hematuria: Secondary | ICD-10-CM

## 2018-12-26 LAB — POCT URINALYSIS DIPSTICK
Bilirubin, UA: NEGATIVE
Glucose, UA: NEGATIVE
Ketones, UA: NEGATIVE
Nitrite, UA: NEGATIVE
Protein, UA: NEGATIVE
Spec Grav, UA: <=1.005 — AB (ref 1.010–1.025)
Urobilinogen, UA: 0.2 E.U./dL
pH, UA: 5 (ref 5.0–8.0)

## 2018-12-26 MED ORDER — CIPROFLOXACIN HCL 250 MG PO TABS: 250.0000 mg | ORAL_TABLET | Freq: Two times a day (BID) | ORAL | 0 refills | Status: AC

## 2018-12-26 NOTE — Progress Notes (Signed)
Subjective:    Patient ID: Stacey Duncan, female    DOB: [birth year], 84 y.o.   MRN: [id number]  Stacey Duncan is a 84 y.o. female presenting on 12/26/2018 for Dysuria (urinary frequency, dysuria and incontinence )   HPI Dysuria Patient presents for persistent dysuria after treatment x 2 courses of antibiotics for presumed UTI after UA and culture. On 11/11/2018 and 12/16/2018. - Is also having itching. - Feels like she is fully emptying her bladder most of the time.  Is also having some urge incontinence, frequency.  Urine is clear and has no foul odor. - Denies flank pain, pelvic pain, back pain, fever, chills, or sweats. - [place] and used a compounded ointment.  This occurred about 3 years ago.  Social History   Tobacco Use  . Smoking status: Never Smoker  . Smokeless tobacco: Never Used  Substance Use Topics  . Alcohol use: Yes    Alcohol/week: 7.0 standard drinks    Types: 7 Glasses of wine per week    Comment: glass of wine daily  . Drug use: No    Review of Systems Per HPI unless specifically indicated above     Objective:    BP 123/62 (BP Location: Right Arm, Patient Position: Sitting, Cuff Size: Normal)   Pulse 60   Temp 98 F (36.7 C) (Oral)   Ht 5\' 3"  (1.6 m)   Wt 146 lb 12.8 oz (66.6 kg)   BMI 26.00 kg/m   Wt Readings from Last 3 Encounters:  12/26/18 146 lb 12.8 oz (66.6 kg)  12/16/18 145 lb (65.8 kg)  12/02/18 147 lb (66.7 kg)    Physical Exam Vitals signs reviewed.  Constitutional:      General: She is not in acute distress.    Appearance: She is well-developed.  HENT:     Head: Normocephalic and atraumatic.  Cardiovascular:     Rate and Rhythm: Normal rate and regular rhythm.     Pulses:          Radial pulses are 2+ on the right side and 2+ on the left side.       Posterior tibial pulses are 1+ on the right side and 1+ on the left side.     Heart sounds: Normal heart sounds, S1 normal and S2  normal.  Pulmonary:     Effort: Pulmonary effort is normal. No respiratory distress.     Breath sounds: Normal breath sounds and air entry.  Abdominal:     General: Bowel sounds are normal. There is no distension.     Palpations: Abdomen is soft.     Tenderness: There is abdominal tenderness in the suprapubic area. There is no right CVA tenderness or left CVA tenderness.     Hernia: No hernia is present.  Genitourinary:    Labia:        Right: No rash, tenderness, lesion or injury.        Left: No rash, tenderness, lesion or injury.      Vagina: Normal. No vaginal discharge or prolapsed vaginal walls.  Musculoskeletal:     Right lower leg: No edema.     Left lower leg: No edema.  Skin:    General: Skin is warm and dry.     Capillary Refill: Capillary refill takes less than 2 seconds.  Neurological:     General: No focal deficit present.     Mental Status: She is alert and  oriented to person, place, and time. Mental status is at baseline.  Psychiatric:        Attention and Perception: Attention normal.        Mood and Affect: Mood and affect normal.        Behavior: Behavior normal. Behavior is cooperative.        Thought Content: Thought content normal.        Judgment: Judgment normal.      Results for orders placed or performed in visit on 12/26/18  POCT Urinalysis Dipstick  Result Value Ref Range   Color, UA light yellow    Clarity, UA clear    Glucose, UA Negative Negative   Bilirubin, UA negatve    Ketones, UA negative    Spec Grav, UA <=1.005 (A) 1.010 - 1.025   Blood, UA large    pH, UA 5.0 5.0 - 8.0   Protein, UA Negative Negative   Urobilinogen, UA 0.2 0.2 or 1.0 E.U./dL   Nitrite, UA negative    Leukocytes, UA Large (3+) (A) Negative   Appearance     Odor        Assessment & Plan:   Problem List Items Addressed This Visit    None    Visit Diagnoses    Acute cystitis with hematuria    -  Primary   Relevant Medications   ciprofloxacin (CIPRO) 250 MG  tablet   Other Relevant Orders   POCT Urinalysis Dipstick (Completed)   Ambulatory referral to Urology     Acute cystitis with hematuria.  Pt symptomatic currently with increased suprapubic pressure, recurrent UTI not responsive to Keflex for E.coli 11/11/2018 and pseudomonas aeruginosa on 12/16/2018. Currently without systemic signs or symptoms of infection.   - No current risk of concurrent STI. - No signs and symptoms of vaginitis (candidiasis, atrophic, or BV)  Plan: 1. START Cipro 250mg  2 times daily for next 5 days.   2. Provided non-pharm measures for UTI prevention for good hygiene. 3. Drink plenty of fluids and improve hydration over next 1 week. 4. Provided precautions for severe symptoms requiring ED visit to include no urine in 24-48 hours. 5. Referral to urology for recurrent UTI 6. Followup 2-5 days as needed for worsening or persistent symptoms.   Meds ordered this encounter  Medications  . ciprofloxacin (CIPRO) 250 MG tablet    Sig: Take 1 tablet (250 mg total) by mouth 2 (two) times daily for 5 days.    Dispense:  6 tablet    Refill:  0    Order Specific Question:   Supervising Provider    Answer:   Smitty CordsKARAMALEGOS, ALEXANDER J [[id number]]    Follow up plan: Return if symptoms worsen or fail to improve.  Wilhelmina McardleLauren Zeynab Klett, DNP, AGPCNP-BC Adult Gerontology Primary Care Nurse Practitioner [facility] 12/26/2018, 3:46 PM

## 2018-12-26 NOTE — Patient Instructions (Addendum)
Cyndi LennertPatricia J Kruk,   Thank you for coming in to clinic today.  1. START ciprofloxacin 250 mg one tablet twice daily for 5 days.  2.Urology referral.  They will call you to schedule. [facility] [address] [place] [id number] Phone: [phone]  3. For your vaginal itching may use OTC hydrocortisone ointment twice daily for 7 days prn for itching.  Please schedule a follow-up appointment with Wilhelmina McardleLauren Jasiah Buntin, AGNP. Return if symptoms worsen or fail to improve.  If you have any other questions or concerns, please feel free to call the clinic or send a message through MyChart. You may also schedule an earlier appointment if necessary.  You will receive a survey after today's visit either digitally by e-mail or paper by [employer] mail. Your experiences and feedback matter to us.  Please respond so we know how we are doing as we provide care for you.   Wilhelmina McardleLauren Fatou Dunnigan, DNP, AGNP-BC Adult Gerontology Nurse Practitioner [facility], [facility]

## 2018-12-26 NOTE — Telephone Encounter (Signed)
error 

## 2018-12-27 ENCOUNTER — Encounter: Payer: Self-pay | Admitting: Nurse Practitioner

## 2019-01-12 DIAGNOSIS — M5416 Radiculopathy, lumbar region: Secondary | ICD-10-CM | POA: Diagnosis not present

## 2019-01-16 ENCOUNTER — Other Ambulatory Visit: Payer: Self-pay | Admitting: Cardiovascular Disease

## 2019-01-16 DIAGNOSIS — I4891 Unspecified atrial fibrillation: Secondary | ICD-10-CM

## 2019-02-09 ENCOUNTER — Encounter: Payer: Self-pay | Admitting: Urology

## 2019-02-09 ENCOUNTER — Ambulatory Visit: Payer: Medicare PPO | Admitting: Urology

## 2019-02-09 VITALS — BP 139/67 | HR 59 | Ht 63.0 in | Wt 148.0 lb

## 2019-02-09 DIAGNOSIS — N390 Urinary tract infection, site not specified: Secondary | ICD-10-CM

## 2019-02-09 DIAGNOSIS — N3001 Acute cystitis with hematuria: Secondary | ICD-10-CM | POA: Diagnosis not present

## 2019-02-09 LAB — MICROSCOPIC EXAMINATION
Bacteria, UA: NONE SEEN
WBC, UA: NONE SEEN /hpf (ref 0–5)

## 2019-02-09 LAB — URINALYSIS, COMPLETE
Bilirubin, UA: NEGATIVE
GLUCOSE, UA: NEGATIVE
Ketones, UA: NEGATIVE
Nitrite, UA: NEGATIVE
PROTEIN UA: NEGATIVE
SPEC GRAV UA: 1.015 (ref 1.005–1.030)
UUROB: 0.2 mg/dL (ref 0.2–1.0)
pH, UA: 7 (ref 5.0–7.5)

## 2019-02-09 NOTE — Addendum Note (Signed)
Addended by: Frankey Shown on: 02/09/2019 01:43 PM   Modules accepted: Orders

## 2019-02-09 NOTE — Progress Notes (Signed)
02/09/2019 12:54 PM   Stacey Duncan [birth year] [id number]  Referring provider: Smitty CordsKaramalegos, Alexander J, DO [address] [employer] [place], [place] [id number]  Chief Complaint  Patient presents with  . Recurrent UTI    HPI: 85 year old female previously seen by me at my former [facility] and [facility] and recurrent UTIs.  I initially saw her in 2008 for microhematuria and pelvic pain.  CT showed a questionable soft tissue mass in the left proximal/mid ureter and she subsequently underwent cystoscopy, ureteroscopy and retrograde pyelography which showed no abnormalities.  [facility].  Renal ultrasound showed no abnormalities.  Cystoscopy was unremarkable.  She was last seen by me in August 2017 and had been doing well without recurrent infections.  She was seen by her PCP in November 2019 with dysuria and urgency.  Urinalysis showed pyuria.  She was started on cephalexin and urine grew pansensitive E. coli.  Her symptoms resolved however she returned on 12/20 complaining of dysuria.  Urinalysis again showed moderate leukocytes and she was started back on cephalexin.  Urine culture grew 10,000-50,000 colonies of pseudomonas aeruginosa for which was not tested for first generation cephalosporins.  She returned on 12/30 complaining of frequency, dysuria and urgency incontinence.  Urinalysis again showed large leukocytes.  A urine culture was not ordered.  She was started on Cipro however states after reading the side effect profile decided not to start this medication.  Her symptoms subsequently resolved.  She has mild urinary frequency and urgency which is her baseline.  PMH: Past Medical History:  Diagnosis Date  . Fibromyalgia   . GERD (gastroesophageal reflux disease)   . HTN (hypertension)   . Hypothyroidism   . Osteoarthritis    a. bilateral knees  . PAF (paroxysmal atrial fibrillation) (HCC)    a. on Eliquis;    Surgical History: Past Surgical  History:  Procedure Laterality Date  . ABDOMINAL HYSTERECTOMY    . CHOLECYSTECTOMY    . ROTATOR CUFF REPAIR     left     Home Medications:  Allergies as of 02/09/2019      Reactions   Sulfa Antibiotics Other (See Comments)   Sulfacetamide Sodium    Other reaction(s): Other (See Comments)      Medication List       Accurate as of February 09, 2019 12:54 PM. Always use your most recent med list.        cyclobenzaprine 5 MG tablet Commonly known as:  FLEXERIL Take 1 tablet (5 mg total) by mouth 3 (three) times daily as needed for muscle spasms.   ELIQUIS 5 MG Tabs tablet Generic drug:  apixaban TAKE ONE (1) TABLET BY MOUTH TWO (2) TIMES DAILY   FLUoxetine 40 MG capsule Commonly known as:  PROZAC Take 1 capsule (40 mg total) daily by mouth.   gabapentin 100 MG capsule Commonly known as:  NEURONTIN TAKE 1 CAPSULE BY MOUTH AT BEDTIME FOR 7 DAYS THEN 2 AT BEDTIME FOR 7 DAYS THEN 3 AT BEDTIME   hydrALAZINE 25 MG tablet Commonly known as:  APRESOLINE Take 1 tablet (25 mg total) by mouth 3 (three) times daily as needed (As needed for blood pressure greater than 160).   hydrALAZINE 25 MG tablet Commonly known as:  APRESOLINE   levothyroxine 100 MCG tablet Commonly known as:  SYNTHROID, LEVOTHROID TAKE 1 TABLET EVERY DAY   losartan 100 MG tablet Commonly known as:  COZAAR TAKE 1 TABLET EVERY DAY  metoprolol tartrate 25 MG tablet Commonly known as:  LOPRESSOR TAKES 1/2 TABLET TWICE A DAY   omeprazole 40 MG capsule Commonly known as:  PRILOSEC Take 1 capsule (40 mg total) by mouth daily.   simvastatin 40 MG tablet Commonly known as:  ZOCOR Take 1 tablet (40 mg total) by mouth at bedtime.   VITAMIN D-1000 MAX ST 25 MCG (1000 UT) tablet Generic drug:  Cholecalciferol Take 1,000 Units by mouth daily.       Allergies:  Allergies  Allergen Reactions  . Sulfa Antibiotics Other (See Comments)  . Sulfacetamide Sodium     Other reaction(s): Other (See Comments)      Family History: Family History  Problem Relation Age of Onset  . Heart disease Mother 375       CABG  . Heart attack Maternal Grandfather   . Heart attack Maternal Grandmother     Social History:  reports that she has never smoked. She has never used smokeless tobacco. She reports current alcohol use of about 7.0 standard drinks of alcohol per week. She reports that she does not use drugs.  ROS: UROLOGY Frequent Urination?: No Hard to postpone urination?: Yes Burning/pain with urination?: No Get up at night to urinate?: Yes Leakage of urine?: Yes Urine stream starts and stops?: Yes Trouble starting stream?: Yes Do you have to strain to urinate?: Yes Blood in urine?: No Urinary tract infection?: Yes Sexually transmitted disease?: No Injury to kidneys or bladder?: No Painful intercourse?: No Weak stream?: No Currently pregnant?: No Vaginal bleeding?: No Last menstrual period?: Postmenopausal  Gastrointestinal Nausea?: No Vomiting?: No Indigestion/heartburn?: Yes Diarrhea?: No Constipation?: No  Constitutional Fever: No Night sweats?: Yes Weight loss?: No Fatigue?: Yes  Skin Skin rash/lesions?: No Itching?: No  Eyes Blurred vision?: No Double vision?: No  Ears/Nose/Throat Sore throat?: No Sinus problems?: No  Hematologic/Lymphatic Swollen glands?: No Easy bruising?: No  Cardiovascular Leg swelling?: No Chest pain?: No  Respiratory Cough?: Yes Shortness of breath?: No  Endocrine Excessive thirst?: No  Musculoskeletal Back pain?: Yes Joint pain?: Yes  Neurological Headaches?: No Dizziness?: Yes  Psychologic Depression?: No Anxiety?: No  Physical Exam: BP 139/67 (BP Location: Left Arm, Patient Position: Sitting, Cuff Size: Normal)   Pulse (!) 59   Ht 5\' 3"  (1.6 m)   Wt 148 lb (67.1 kg)   BMI 26.22 kg/m   Constitutional:  Alert and oriented, No acute distress. HEENT: [place] AT, moist mucus membranes.  Trachea midline, no  masses. Cardiovascular: No clubbing, cyanosis, or edema. Respiratory: Normal respiratory effort, no increased work of breathing. Skin: No rashes, bruises or suspicious lesions. Neurologic: Grossly intact, no focal deficits, moving all 4 extremities. Psychiatric: Normal mood and affect.  Laboratory Data:  Urinalysis Dipstick 2+ blood, 2+ leukocytes, nitrite negative Microscopy 3-10 RBC/0 WBC  Assessment & Plan:   85 year old female with history of recurrent UTIs.  Urinalysis today shows microhematuria.  I ordered a urine culture and she will be notified with results.  Consider repeat imaging and cystoscopy if culture negative.  Further recommendations pending culture results.   Riki AltesScott C , MD  [facility] [address], [address] [place], [place] [address] [phone]

## 2019-02-12 LAB — CULTURE, URINE COMPREHENSIVE

## 2019-02-13 ENCOUNTER — Telehealth: Payer: Self-pay | Admitting: Family Medicine

## 2019-02-13 DIAGNOSIS — M5416 Radiculopathy, lumbar region: Secondary | ICD-10-CM | POA: Diagnosis not present

## 2019-02-13 DIAGNOSIS — M545 Low back pain: Secondary | ICD-10-CM | POA: Diagnosis not present

## 2019-02-13 NOTE — Telephone Encounter (Signed)
Pt called office and I read message from [place]. °

## 2019-02-13 NOTE — Telephone Encounter (Signed)
LMOM for patient to return call.

## 2019-02-13 NOTE — Telephone Encounter (Signed)
-----   Message from Riki Altes, MD sent at 02/13/2019  8:43 AM EST ----- Urine culture shows no significant bacterial growth.  Since she did have microhematuria recommend a repeat urinalysis in 6 weeks.

## 2019-02-27 DIAGNOSIS — M545 Low back pain: Secondary | ICD-10-CM | POA: Diagnosis not present

## 2019-02-27 DIAGNOSIS — M25561 Pain in right knee: Secondary | ICD-10-CM | POA: Diagnosis not present

## 2019-03-06 DIAGNOSIS — M545 Low back pain: Secondary | ICD-10-CM | POA: Diagnosis not present

## 2019-03-06 DIAGNOSIS — M25561 Pain in right knee: Secondary | ICD-10-CM | POA: Diagnosis not present

## 2019-03-25 ENCOUNTER — Other Ambulatory Visit: Payer: Self-pay | Admitting: Cardiovascular Disease

## 2019-03-27 ENCOUNTER — Other Ambulatory Visit: Payer: Medicare PPO

## 2019-03-27 NOTE — Telephone Encounter (Signed)
Please review for refill.  

## 2019-03-27 NOTE — Telephone Encounter (Signed)
Please review for refill. Thanks!  

## 2019-04-18 DIAGNOSIS — M545 Low back pain: Secondary | ICD-10-CM | POA: Diagnosis not present

## 2019-04-18 DIAGNOSIS — M1711 Unilateral primary osteoarthritis, right knee: Secondary | ICD-10-CM | POA: Diagnosis not present

## 2019-04-24 DIAGNOSIS — M533 Sacrococcygeal disorders, not elsewhere classified: Secondary | ICD-10-CM | POA: Diagnosis not present

## 2019-05-04 ENCOUNTER — Telehealth: Payer: Self-pay

## 2019-05-04 NOTE — Telephone Encounter (Signed)
Called patient from recall list.  Patient refused Evisit.  Old recall will be deleted.  NEW recall placed for August.

## 2019-05-15 DIAGNOSIS — M545 Low back pain: Secondary | ICD-10-CM | POA: Diagnosis not present

## 2019-05-15 DIAGNOSIS — M5416 Radiculopathy, lumbar region: Secondary | ICD-10-CM | POA: Diagnosis not present

## 2019-05-25 ENCOUNTER — Other Ambulatory Visit: Payer: Self-pay | Admitting: Family Medicine

## 2019-05-25 ENCOUNTER — Other Ambulatory Visit: Payer: Self-pay | Admitting: Cardiovascular Disease

## 2019-05-25 DIAGNOSIS — E039 Hypothyroidism, unspecified: Secondary | ICD-10-CM

## 2019-05-29 ENCOUNTER — Other Ambulatory Visit: Payer: Medicare PPO

## 2019-06-05 ENCOUNTER — Encounter: Payer: Medicare PPO | Admitting: Family Medicine

## 2019-06-08 ENCOUNTER — Telehealth: Payer: Self-pay | Admitting: Cardiovascular Disease

## 2019-06-08 NOTE — Telephone Encounter (Signed)
Spoke with patient who states HR today has been 100, 96 and 106 bpm. Currently BP is 126/64 mmHg and pulse 78 bpm. States it did not feel like a fib. Reports she has hx of a fib with RVR and has been to the hospital on multiple occasions for that; this did not feel like that. She thought she was supposed to take additional hydralazine for elevated HR. I advised that hydralazine is prescribed for systolic BP >885 mmHg. She is currently taking metoprolol tartrate 12.5 mg BID. Patient has been on higher dose of metoprolol in the past but patient's BP is sometimes < 100 mmHg. I advised that I will forward message to Dr. Rockey Situ and his primary nurse, Jeannene Patella for advice and that someone from the office will call her today or tomorrow with his advice. She is aware that an office visit or virtual visit may be needed. She verbalized understanding and agreement with plan and thanked me for the call.

## 2019-06-08 NOTE — Telephone Encounter (Signed)
Patient states she feels weak, and jittery, states she doesn't feel comfortable walking, doesn't feel secure when she walks. Denies any pain, slight SOB, walking makes worse. Denies nausea, vomiting, dizziness. States her head "doesn't feel right" States her BP is good, 120/"something", HR 100. Patient states she has taken another Hydralazine(she has taken 2 today) after she saw her HR was 100. Please call to discuss.

## 2019-06-10 NOTE — Telephone Encounter (Signed)
She would benefit from EKG if rate continues to run high History of chronic leg weakness, she will need home physical therapy?

## 2019-06-13 NOTE — Telephone Encounter (Signed)
Patient states she has leveled off and has not been having any more problems. She also denies leg weakness now and states that she believes it was just a cluster of her health conditions with fibromyalgia and hypothyroidism. She states that since then her heart rates have been in the 70's and no weakness in her legs now. Instructed her to continue monitoring and call us if she has any return of concerning symptoms. She verbalized understanding with no further questions at this time.

## 2019-06-21 ENCOUNTER — Telehealth: Payer: Self-pay | Admitting: Cardiovascular Disease

## 2019-06-21 ENCOUNTER — Telehealth: Payer: Self-pay | Admitting: Nurse Practitioner

## 2019-06-21 NOTE — Telephone Encounter (Signed)
Pt c/o medication issue:  1. Name of Medication: Metoprolol and hydralazine   2. How are you currently taking this medication (dosage and times per day)?  3. Are you having a reaction (difficulty breathing--STAT)?  No   4. What is your medication issue? Patient thinks she is either taking too much or not enough meds at times  C/o unstable off balance when ambulating and HR last night was 100   She says bp fluctuates as well as HR

## 2019-06-21 NOTE — Telephone Encounter (Signed)
    COVID-19 Pre-Screening Questions:  . In the past 7 to 10 days have you had a cough,  shortness of breath, headache, congestion, fever (100 or greater) body aches, chills, sore throat, or sudden loss of taste or sense of smell? NO . Have you been around anyone with known Covid 19. NO . Have you been around anyone who is awaiting Covid 19 test results in the past 7 to 10 days? NO . Have you been around anyone who has been exposed to Covid 19, or has mentioned symptoms of Covid 19 within the past 7 to 10 days? NO  If you have any concerns/questions about symptoms patients report during screening (either on the phone or at threshold). Contact the provider seeing the patient or DOD for further guidance.  If neither are available contact a member of the leadership team.   Patient is scheduled for 06/22/19 at 1:30 pm tomorrow with Ignacia Bayley, NP.  She is aware to come in through [facility]. She is aware to come in alone. She is aware to wear a mask and that she will need to have this on for the entirety of her visit.  She voices understanding of the above and is agreeable.

## 2019-06-21 NOTE — Telephone Encounter (Signed)
I spoke with the patient. She called 2 weeks ago with reports of feeling "off balance." This has persisted over the last 2 weeks. She has seen variation in her SBP from 99-146. Her HR last night was 100 bpm, she took a full metoprolol 25 mg at bedtime and her HR came down to 95 bpm.  She states she went on to bed as she didn't want to wait up any longer to see what her rates would do.  She slept well.  This morning, her HR's are back in the 60's.  Confirmed she is taking: - Metoprolol 25 mg- 1/2 tablet (12.5 mg) BID  - Hydralazine 25 mg is listed in her chart as 1 tablet TID PRN for SBP >160. - per the patient, she is taking hydralazine 25 mg BID "because I felt like I needed it."  - Losartan 100 mg once daily is listed on her chart. - per the patient, she was told to stop this at some point and hasn't taken it in 1 year.  - she is on eliquis 5 mg BID  I advised the patient with variations in her BP/ HR, her history of AF, and that she hasn't been seen in over year, I feel that we should bring her in for an appointment in the office to assess her EKG.   She is agreeable with this. She is scheduled for 06/22/19 at 1:30 pm with Ignacia Bayley, NP. She has been screened (see separate encounter).

## 2019-06-22 ENCOUNTER — Ambulatory Visit (INDEPENDENT_AMBULATORY_CARE_PROVIDER_SITE_OTHER): Payer: Medicare PPO

## 2019-06-22 ENCOUNTER — Ambulatory Visit: Payer: Medicare PPO | Admitting: Nurse Practitioner

## 2019-06-22 ENCOUNTER — Encounter: Payer: Self-pay | Admitting: Nurse Practitioner

## 2019-06-22 ENCOUNTER — Other Ambulatory Visit: Payer: Self-pay

## 2019-06-22 VITALS — BP 140/72 | HR 62 | Temp 97.2°F | Ht 63.0 in | Wt 151.5 lb

## 2019-06-22 DIAGNOSIS — R400 Somnolence: Secondary | ICD-10-CM | POA: Diagnosis not present

## 2019-06-22 DIAGNOSIS — R2681 Unsteadiness on feet: Secondary | ICD-10-CM

## 2019-06-22 DIAGNOSIS — R002 Palpitations: Secondary | ICD-10-CM | POA: Diagnosis not present

## 2019-06-22 DIAGNOSIS — R5383 Other fatigue: Secondary | ICD-10-CM

## 2019-06-22 DIAGNOSIS — I10 Essential (primary) hypertension: Secondary | ICD-10-CM

## 2019-06-22 DIAGNOSIS — I480 Paroxysmal atrial fibrillation: Secondary | ICD-10-CM | POA: Diagnosis not present

## 2019-06-22 DIAGNOSIS — E039 Hypothyroidism, unspecified: Secondary | ICD-10-CM

## 2019-06-22 DIAGNOSIS — E782 Mixed hyperlipidemia: Secondary | ICD-10-CM | POA: Diagnosis not present

## 2019-06-22 MED ORDER — HYDRALAZINE HCL 25 MG PO TABS: 25.0000 mg | ORAL_TABLET | Freq: Two times a day (BID) | ORAL | 3 refills | Status: DC

## 2019-06-22 NOTE — Patient Instructions (Signed)
Medication Instructions:  Your physician has recommended you make the following change in your medication:  1- DECREASE Hydralazine to 1 tablet (25 mg total) twice daily   If you need a refill on your cardiac medications before your next appointment, please call your pharmacy.   Lab work: Your physician recommends that you have lab work today( Lipids, CBC, CMET, TSH)  If you have labs (blood work) drawn today and your tests are completely normal, you will receive your results only by: Marland Kitchen MyChart Message (if you have MyChart) OR . A paper copy in the mail If you have any lab test that is abnormal or we need to change your treatment, we will call you to review the results.  Testing/Procedures: 1-  Zio XT: We will place order and you will receive it in the mail.  You may get a call from [place] @ either  [phone] Or  [phone] for them to confirm your address before it will be sent to you.  You will wear the monitor for 14 days, remove it and send it back to the company. They will send [place] a report. Then we will call you with the results.   Follow-Up: At [facility], you and your health needs are our priority.  As part of our continuing mission to provide you with exceptional heart care, we have created designated Provider Care Teams.  These Care Teams include your primary Cardiologist (physician) and Advanced Practice Providers (APPs -  Physician Assistants and Nurse Practitioners) who all work together to provide you with the care you need, when you need it. You will need a follow up appointment in 6 weeks.  You may see Ida Rogue, MD or Murray Hodgkins, NP.

## 2019-06-22 NOTE — Progress Notes (Signed)
Office Visit    Patient Name: Stacey Duncan Date of Encounter: 06/22/2019  Primary Care Provider:  [name], DO Primary Cardiologist:  Ida Rogue, MD  Chief Complaint    85 y/o ? w/ a h/o PAF on eliquis, HTN, HL, fibromyalgia, anxiety, depression, hypothyroidism, and recurrent UTI's, who presents for f/u related to elevated heart rates.   Past Medical History    Past Medical History:  Diagnosis Date  . Anxiety   . Fibromyalgia   . GERD (gastroesophageal reflux disease)   . History of echocardiogram    a. 02/2016 Echo: EF 60-65%, no rwma. Nl RV fxn and PASP.  Marland Kitchen HTN (hypertension)   . Hypothyroidism   . Mixed hyperlipidemia   . Osteoarthritis    a. bilateral knees  . PAF (paroxysmal atrial fibrillation) ([place])    a. 02/2015 & 09/2017 - admit w/ Afib, briefly req Amio; b. CHA2DSVASc = 4-->Eliquis.  . Recurrent UTI (urinary tract infection)   . Sinus bradycardia    a. Has limited beta blocker dosing.   Past Surgical History:  Procedure Laterality Date  . ABDOMINAL HYSTERECTOMY    . CHOLECYSTECTOMY    . ROTATOR CUFF REPAIR     left     Allergies  Allergies  Allergen Reactions  . Sulfa Antibiotics Other (See Comments)  . Sulfacetamide Sodium     Other reaction(s): Other (See Comments)    History of Present Illness    85 y/o ? w/ a h/o PAF on eliquis, HTN, HL, fibromyalgia, anxiety, depression, hypothyroidism, and recurrent UTI's.  She was dx w/ paroxysmal atrial fibrillation in March 2016.  She had recurrent A. fib in October 2018.  On both occasions, she did require amiodarone bolus for conversion.  She has been anticoagulated with Eliquis therapy in the setting of a CHA2DS2VASc of 4.  Echocardiogram in March 2017 showed normal LV function.    She was last seen in cardiology clinic in March 2019.  At that time, she was having some dizziness and elevated blood pressures.  She was bradycardic and beta-blocker dose was reduced to 12.5 mg twice  daily.  She was also prescribed hydralazine 25 mg 3 times daily as needed for systolics greater than 409.  She has called several times over the past 2 weeks in the setting of elevated heart rates to approximately 100 with a sensation of weakness and feeling jittery.  She would take hydralazine when heart rate was elevated thinking that this is what it was prescribed for.  On June 11, she was advised to come in for an ECG, however she subsequently reported improvement in symptoms and preferred to just monitor things at home.  She had recurrence of elevated rates and variable blood pressures yesterday, prompting her to call and an appointment was scheduled.  In general, she says that she has some degree of chronic fatigue.  Since the Covid outbreak, she has been sitting more, and though she sleeps 7 hrs each night, she frequently naps in the afternoons b/c she feels tired.  She does snore, apparently loudly, and has never had a sleep study (not currently interested either).  She isn't sure if her jittery symptoms precede elevated HRs or vice versa.  She has noted unsteady gait during symptoms however.  When she had afib in the past, she would develop presyncope, and she has not had this recently.  She denies chest pain, palpitations, dyspnea, pnd, orthopnea, n, v, syncope, edema, weight gain, or early satiety.  Home Medications    Prior to Admission medications   Medication Sig Start Date End Date Taking? Authorizing Provider  Cholecalciferol (VITAMIN D-1000 MAX ST) 1000 UNITS tablet Take 1,000 Units by mouth daily.     [provider]  cyclobenzaprine (FLEXERIL) 5 MG tablet Take 1 tablet (5 mg total) by mouth 3 (three) times daily as needed for muscle spasms. 05/27/18   Amyot, Ali LoweAnn Berry, NP  ELIQUIS 5 MG TABS tablet TAKE ONE (1) TABLET BY MOUTH TWO (2) TIMES DAILY 03/27/19   Antonieta IbaGollan, Timothy J, MD  FLUoxetine (PROZAC) 40 MG capsule Take 1 capsule (40 mg total) daily by mouth. 11/09/17   Karamalegos,  Netta NeatAlexander J, DO  gabapentin (NEURONTIN) 100 MG capsule TAKE 1 CAPSULE BY MOUTH AT BEDTIME FOR 7 DAYS THEN 2 AT BEDTIME FOR 7 DAYS THEN 3 AT BEDTIME 11/09/18   [provider]  hydrALAZINE (APRESOLINE) 25 MG tablet 3 (three) times daily. Take 1 tablet (25 mg) by mouth twice daily    [provider]  levothyroxine (SYNTHROID) 100 MCG tablet TAKE 1 TABLET EVERY DAY 05/25/19   Karamalegos, Netta NeatAlexander J, DO  metoprolol tartrate (LOPRESSOR) 25 MG tablet TAKES 1/2 TABLET TWICE A DAY 01/16/19   Antonieta IbaGollan, Timothy J, MD  omeprazole (PRILOSEC) 40 MG capsule Take 1 capsule (40 mg total) by mouth daily. 04/29/17   Karamalegos, Netta NeatAlexander J, DO  simvastatin (ZOCOR) 40 MG tablet Take 1 tablet (40 mg total) by mouth at bedtime. 05/26/18   Smitty CordsKaramalegos, Alexander J, DO    Review of Systems    Jittery feeling, intermittent unsteady gait, elevated heart rates, fatigue, daytime somnolence.  She denies chest pain, palpitations, dyspnea, pnd, orthopnea, n, v, dizziness, syncope, edema, weight gain, or early satiety.  All other systems reviewed and are otherwise negative except as noted above.  Physical Exam    VS:  BP 140/72 (BP Location: Left Arm, Patient Position: Sitting, Cuff Size: Normal)   Pulse 62   Temp (!) 97.2 F (36.2 C)   Ht 5\' 3"  (1.6 m)   Wt 151 lb 8 oz (68.7 kg)   SpO2 94%   BMI 26.84 kg/m  , BMI Body mass index is 26.84 kg/m. GEN: Well nourished, well developed, in no acute distress. HEENT: normal. Neck: Supple, no JVD, carotid bruits, or masses. Cardiac: RRR, no murmurs, rubs, or gallops. No clubbing, cyanosis, edema.  Radials/DP/PT 2+ and equal bilaterally.  Respiratory:  Respirations regular and unlabored, clear to auscultation bilaterally. GI: Soft, nontender, nondistended, BS + x 4. MS: no deformity or atrophy. Skin: warm and dry, no rash. Neuro:  Strength and sensation are intact. Psych: Normal affect.  Accessory Clinical Findings    ECG personally reviewed by me today -  sinus arrhythmia, 62, left axis, delayed R progression - no acute changes.  Assessment & Plan    1.  Elevated HR's/PAF:  Pt has recently had spells where she feels jittery and on edge w/o other assoc Ss.  During these times, HR's elevated into the low 100's.  She does not think that this represents afib, as prior episodes of afib were associated w/ presyncope and that is not a current symptom.  She has agreed to placement of a 14-day ZIO monitor.  It has also been greater than a year since her last lab work and I will follow-up a basic metabolic panel, CBC, and TSH today.  She remains on low-dose beta-blocker with dose previously limited by sinus bradycardia.  She also remains on Eliquis.  2.  Unsteady gait: Somewhat nonspecific and chronic.  No presyncope/syncope.  Unsteadiness is not necessarily more likely to occur during these jittery episodes or when heart rates are elevated.  She does use a cane.  Monitoring as above might be helpful to further elucidate.  Follow-up labs as above.  3.  Chronic fatigue and daytime somnolence: She feels as though she has been tiring more easily during the day and will now frequently take naps.  Following a nap, she does seem to have more energy.  She is fairly certain that she sleeps pretty well at night, often greater than 7 hours.  Her husband has told her that she snores and she says it must be pretty loud because he is fairly deaf.  We did discuss pursuing a sleep study to assess for sleep apnea however, she wishes to defer at this time.  4.  Hypothyroidism: On levothyroxine.  TSH was normal in December.  I will follow-up as we are drawing labs today.  5.  Hyperlipidemia: LDL was 192 in May 2019.  Though simvastatin was on her list, she says she does not think she is taking it.  Follow-up lipids and LFTs today with a low threshold to resume a higher potency statin.  6.  Essential HTN:  BP sounds to be labile @ home.  Freq 120's but sometimes higher.  140/72  today.  Given that she notes a trend freq in the 120-130 range, I will cont hydralazine @ 25mg  BID.  She will cont to follow bp's @ home and understands the importance of not missing doses, as it may result in rebound HTN.  7.  Disposition: Follow-up lab work and Zi0 monitor as above.  Follow-up with Dr. Mariah MillingGollan in 6 weeks or sooner if necessary.  Nicolasa Duckinghristopher Andrey Hoobler, NP 06/22/2019, 2:33 PM

## 2019-06-23 ENCOUNTER — Telehealth: Payer: Self-pay

## 2019-06-23 ENCOUNTER — Telehealth: Payer: Self-pay | Admitting: *Deleted

## 2019-06-23 DIAGNOSIS — F339 Major depressive disorder, recurrent, unspecified: Secondary | ICD-10-CM

## 2019-06-23 DIAGNOSIS — E782 Mixed hyperlipidemia: Secondary | ICD-10-CM

## 2019-06-23 DIAGNOSIS — E7800 Pure hypercholesterolemia, unspecified: Secondary | ICD-10-CM

## 2019-06-23 DIAGNOSIS — F411 Generalized anxiety disorder: Secondary | ICD-10-CM

## 2019-06-23 DIAGNOSIS — Z79899 Other long term (current) drug therapy: Secondary | ICD-10-CM

## 2019-06-23 LAB — TSH: TSH: 0.219 u[IU]/mL — ABNORMAL LOW (ref 0.450–4.500)

## 2019-06-23 LAB — COMPREHENSIVE METABOLIC PANEL
ALT: 15 IU/L (ref 0–32)
AST: 19 IU/L (ref 0–40)
Albumin/Globulin Ratio: 1.6 (ref 1.2–2.2)
Albumin: 4 g/dL (ref 3.6–4.6)
Alkaline Phosphatase: 75 IU/L (ref 39–117)
BUN/Creatinine Ratio: 18 (ref 12–28)
BUN: 14 mg/dL (ref 8–27)
Bilirubin Total: 0.3 mg/dL (ref 0.0–1.2)
CO2: 23 mmol/L (ref 20–29)
Calcium: 9.3 mg/dL (ref 8.7–10.3)
Chloride: 101 mmol/L (ref 96–106)
Creatinine, Ser: 0.8 mg/dL (ref 0.57–1.00)
GFR calc Af Amer: 78 mL/min/{1.73_m2} (ref >59)
GFR calc non Af Amer: 67 mL/min/{1.73_m2} (ref >59)
Globulin, Total: 2.5 g/dL (ref 1.5–4.5)
Glucose: 95 mg/dL (ref 65–99)
Potassium: 4.4 mmol/L (ref 3.5–5.2)
Sodium: 140 mmol/L (ref 134–144)
Total Protein: 6.5 g/dL (ref 6.0–8.5)

## 2019-06-23 LAB — LIPID PANEL
Chol/HDL Ratio: 5.3 ratio — ABNORMAL HIGH (ref 0.0–4.4)
Cholesterol, Total: 267 mg/dL — ABNORMAL HIGH (ref 100–199)
HDL: 50 mg/dL (ref >39)
LDL Calculated: 185 mg/dL — ABNORMAL HIGH (ref 0–99)
Triglycerides: 161 mg/dL — ABNORMAL HIGH (ref 0–149)
VLDL Cholesterol Cal: 32 mg/dL (ref 5–40)

## 2019-06-23 LAB — CBC
Hematocrit: 40.4 % (ref 34.0–46.6)
Hemoglobin: 13.3 g/dL (ref 11.1–15.9)
MCH: 29.4 pg (ref 26.6–33.0)
MCHC: 32.9 g/dL (ref 31.5–35.7)
MCV: 89 fL (ref 79–97)
Platelets: 295 10*3/uL (ref 150–450)
RBC: 4.53 x10E6/uL (ref 3.77–5.28)
RDW: 14.1 % (ref 11.7–15.4)
WBC: 8.7 10*3/uL (ref 3.4–10.8)

## 2019-06-23 LAB — LDL CHOLESTEROL, DIRECT: LDL Direct: 196 mg/dL — ABNORMAL HIGH (ref 0–99)

## 2019-06-23 MED ORDER — ATORVASTATIN CALCIUM 80 MG PO TABS: 80.0000 mg | ORAL_TABLET | Freq: Every day | ORAL | 3 refills | Status: DC

## 2019-06-23 MED ORDER — FLUOXETINE HCL 40 MG PO CAPS: 40.0000 mg | ORAL_CAPSULE | Freq: Every day | ORAL | 1 refills | Status: DC

## 2019-06-23 NOTE — Telephone Encounter (Signed)
Will go ahead and send rx. Thanks  Nobie Putnam, [facility] 06/23/2019, 4:56 PM

## 2019-06-23 NOTE — Telephone Encounter (Signed)
Called [facility] and will attempt to add on Free T4. They will pull the specimen and then fax [place] a confirmation if able to add on or not.    Results called to pt. Pt verbalized understanding. She confirmed she is not taking simvastatin. She is agreeable to start atorvastatin 80 mg daily. Rx sent to requested pharmacy. She is agreeable to lab work in about 6 weeks. She is also due for lab work at her PCP. She is going to try to reschedule her lab work with her PCP office to have done on 8/14 which is a few days before follow up with Dr Rockey Situ. I advised her to make sure the draw a lipid and liver panel.  Lab orders entered. She is also aware I have added on the free T4 and will update her once determined.   She also mentioned that she thought about the sleep study and is agreeable to this after more thought.  Let her know that we usually make the referral to pulmonology as the provider specializes in the sleep evaluation.  She is fine with this. Routing to provider to ok the referral to pulmonology.

## 2019-06-23 NOTE — Telephone Encounter (Signed)
-----   Message from Theora Gianotti, NP sent at 06/23/2019  7:59 AM EDT ----- LDL very high @ 196.    She should look at her medicine bottles to be sure that she is not currently taking simvastatin (she reported that she was not taking it yesterday).  Rec initiating atorvastatin 80mg  daily w/ f/u lipids and lft's in 6 wks.  Lytes, liver enzymes, kidney fxn, and blood counts wnl.  TSH is mildly depressed, which could indicate over-replacement.  Please arrange for f/u Free T4 within the next few days and then we can determine what we need to do w/ her levothyroxine dose.  If they can add FT4 to blood in lab, that would be even better.

## 2019-06-23 NOTE — Telephone Encounter (Signed)
Patient returning call.

## 2019-06-23 NOTE — Telephone Encounter (Signed)
The pt called requesting a refill of Prozac. The last prescription in the chart is from 11/09/2017, but the patient states she's been taking it daily. I called [employer] and they informed me that her last refill was 07/2018.

## 2019-06-26 ENCOUNTER — Telehealth: Payer: Self-pay | Admitting: *Deleted

## 2019-06-26 DIAGNOSIS — I480 Paroxysmal atrial fibrillation: Secondary | ICD-10-CM

## 2019-06-26 DIAGNOSIS — Z0000 Encounter for general adult medical examination without abnormal findings: Secondary | ICD-10-CM

## 2019-06-26 DIAGNOSIS — E032 Hypothyroidism due to medicaments and other exogenous substances: Secondary | ICD-10-CM

## 2019-06-26 DIAGNOSIS — R7303 Prediabetes: Secondary | ICD-10-CM

## 2019-06-26 DIAGNOSIS — E782 Mixed hyperlipidemia: Secondary | ICD-10-CM

## 2019-06-26 DIAGNOSIS — I10 Essential (primary) hypertension: Secondary | ICD-10-CM

## 2019-06-26 NOTE — Telephone Encounter (Signed)
Results called to pt. Pt verbalized understanding. Patient would like to remain on current dose of levothyroxine and discuss with PCP in August. She would also like to have her LIPID and LIVER profile drawn at her lab appointment coming up with PCP so that she does not have to have multiple lab work. Advised I will route to her PCP's office to make sure this is ok and if they will order it for [place].

## 2019-06-26 NOTE — Telephone Encounter (Signed)
-----   Message from Theora Gianotti, NP sent at 06/26/2019 11:16 AM EDT ----- Her Free T4 has returned in the normal range.  I discussed her results with her primary care provider and he suggested that we give her the option of either 1. Reduce levothyroxine to 34mcg/day or 2. Leave the dose @ 144mcg/day and he will f/u and discuss further if necessary when he sees her in August.  In either situation, we will be keeping an eye on her thyroid levels, and based on her mixed results, there doesn't appear to be any harm in one over the other.  If she opts to reduce to 88 mcg, please let me know so that I can provide feedback to Dr. Parks Ranger, as his office will send in the Rx.

## 2019-06-26 NOTE — Telephone Encounter (Signed)
Spoke with patient in another encounter about the lab work. Message sent to Dr Parks Ranger to ask if this is ok.

## 2019-06-26 NOTE — Telephone Encounter (Signed)
Patient wants to know if repeat labs can be done at pcp appt   Patient wants to know if we can send this to the pcp office so they will know

## 2019-06-26 NOTE — Telephone Encounter (Deleted)
Staff Message sent to Melissa Tatum, Heather Mcghee and Kathy Woodward to see about earlier appointment.  

## 2019-06-26 NOTE — Telephone Encounter (Signed)
Thanks for keeping me in the loop with these updates.  Sounds like a plan. She can stay on Levothyroxine 134mcg daily for now.  I will plan to order labs now for 08/02/19 then her physical with me is a week later.   I see the orders in the system for Lipid and Hepatic Function Panel - I will actually go ahead and cancel the ones you have ordered and re order same labs for [facility] here.  Will still get Hepatic Fxn Panel and Lipid, along with other basic labs + the Thyroid function panel to re-check.  Let me know if you need anything else, the results of the CMET + Lipids will be on her chart a few days after 08/02/19.  One issue - I did have to re order [facility] to [facility], it seems her default was changed with last orders.  Nobie Putnam, DO [facility] 06/26/2019, 5:46 PM

## 2019-06-28 ENCOUNTER — Telehealth: Payer: Self-pay | Admitting: Cardiovascular Disease

## 2019-06-28 LAB — T4, FREE: Free T4: 1.36 ng/dL (ref 0.82–1.77)

## 2019-06-28 LAB — SPECIMEN STATUS REPORT

## 2019-06-28 NOTE — Telephone Encounter (Signed)
Returned call to patient. I confirmed with her that blood was to be taken prior to [place] with Karamegalos in August.  Pt verbalized understanding and had no further questions at this time.   Advised pt to call for any further questions or concerns.

## 2019-06-28 NOTE — Telephone Encounter (Signed)
Patient calling to clarify lab date   Please call

## 2019-07-06 ENCOUNTER — Other Ambulatory Visit: Payer: Self-pay | Admitting: Cardiovascular Disease

## 2019-07-06 NOTE — Telephone Encounter (Signed)
Eliquis 5mg  refill request received; pt is 85 yrs old, wt-68.7kg, Crea-0.80 on 06/22/2019, last seen by Ignacia Bayley on 06/22/2019; will send in refill to requested pharmacy.

## 2019-07-06 NOTE — Telephone Encounter (Signed)
Refill Request.  

## 2019-07-14 ENCOUNTER — Ambulatory Visit (INDEPENDENT_AMBULATORY_CARE_PROVIDER_SITE_OTHER): Payer: Medicare PPO | Admitting: Family Medicine

## 2019-07-14 ENCOUNTER — Other Ambulatory Visit: Payer: Self-pay

## 2019-07-14 ENCOUNTER — Encounter: Payer: Self-pay | Admitting: Family Medicine

## 2019-07-14 DIAGNOSIS — R300 Dysuria: Secondary | ICD-10-CM

## 2019-07-14 DIAGNOSIS — N3000 Acute cystitis without hematuria: Secondary | ICD-10-CM

## 2019-07-14 DIAGNOSIS — R002 Palpitations: Secondary | ICD-10-CM | POA: Diagnosis not present

## 2019-07-14 LAB — POCT URINALYSIS DIPSTICK
Bilirubin, UA: NEGATIVE
Blood, UA: NEGATIVE
Glucose, UA: NEGATIVE
Ketones, UA: NEGATIVE
Nitrite, UA: NEGATIVE
Protein, UA: NEGATIVE
Spec Grav, UA: 1.01 (ref 1.010–1.025)
Urobilinogen, UA: 0.2 E.U./dL
pH, UA: 5 (ref 5.0–8.0)

## 2019-07-14 MED ORDER — CIPROFLOXACIN HCL 250 MG PO TABS: 250.0000 mg | ORAL_TABLET | Freq: Two times a day (BID) | ORAL | 0 refills | Status: DC

## 2019-07-14 NOTE — Progress Notes (Signed)
Virtual Visit via Telephone The purpose of this virtual visit is to provide medical care while limiting exposure to the novel coronavirus (COVID19) for both patient and office staff.  Consent was obtained for phone visit:  Yes.   Answered questions that patient had about telehealth interaction:  Yes.   I discussed the limitations, risks, security and privacy concerns of performing an evaluation and management service by telephone. I also discussed with the patient that there may be a patient responsible charge related to this service. The patient expressed understanding and agreed to proceed.  Patient Location: Home [facility]  ---------------------------------------------------------------------- Chief Complaint  Patient presents with  . Dysuria    x 1 days    S: Reviewed CMA documentation. I have called patient and gathered additional HPI as follows:  UTI / Dysuria Reports that symptoms started 1 day ago with typical UTI symptoms for her with dysuria frequency urgency, has urologist, they have treated before, last UTI tried keflex failed unresolved then resolve on cipro. She left us urine sample today. Urology was following her for microhematuria, last was positive from them  Denies any high risk travel to areas of current concern for COVID19. Denies any known or suspected exposure to person with or possibly with COVID19.  Denies any fevers, chills, sweats, body ache, cough, shortness of breath, sinus pain or pressure, headache, abdominal pain, diarrhea  Past Medical History:  Diagnosis Date  . Anxiety   . Fibromyalgia   . GERD (gastroesophageal reflux disease)   . History of echocardiogram    a. 02/2016 Echo: EF 60-65%, no rwma. Nl RV fxn and PASP.  Marland Kitchen. HTN (hypertension)   . Hypothyroidism   . Mixed hyperlipidemia   . Osteoarthritis    a. bilateral knees  . PAF (paroxysmal atrial fibrillation) ([facility])    a. 02/2015 & 09/2017 - admit  w/ Afib, briefly req Amio; b. CHA2DSVASc = 4-->Eliquis.  . Recurrent UTI (urinary tract infection)   . Sinus bradycardia    a. Has limited beta blocker dosing.   Social History   Tobacco Use  . Smoking status: Never Smoker  . Smokeless tobacco: Never Used  Substance Use Topics  . Alcohol use: Yes    Alcohol/week: 7.0 standard drinks    Types: 7 Glasses of wine per week    Comment: glass of wine daily  . Drug use: No    Current Outpatient Medications:  .  atorvastatin (LIPITOR) 80 MG tablet, Take 1 tablet (80 mg total) by mouth daily at 6 PM., Disp: 90 tablet, Rfl: 3 .  Cholecalciferol (VITAMIN D-1000 MAX ST) 1000 UNITS tablet, Take 1,000 Units by mouth daily. , Disp: , Rfl:  .  ELIQUIS 5 MG TABS tablet, TAKE ONE (1) TABLET BY MOUTH TWO (2) TIMES DAILY, Disp: 60 tablet, Rfl: 5 .  FLUoxetine (PROZAC) 40 MG capsule, Take 1 capsule (40 mg total) by mouth daily., Disp: 90 capsule, Rfl: 1 .  gabapentin (NEURONTIN) 100 MG capsule, Take 100 mg by mouth 2 (two) times daily. , Disp: , Rfl: 0 .  hydrALAZINE (APRESOLINE) 25 MG tablet, Take 1 tablet (25 mg total) by mouth 2 (two) times daily., Disp: 180 tablet, Rfl: 3 .  levothyroxine (SYNTHROID) 100 MCG tablet, TAKE 1 TABLET EVERY DAY, Disp: 90 tablet, Rfl: 3 .  metoprolol tartrate (LOPRESSOR) 25 MG tablet, TAKES 1/2 TABLET TWICE A DAY, Disp: 90 tablet, Rfl: 3 .  omeprazole (PRILOSEC) 40 MG capsule, Take 1 capsule (40 mg  total) by mouth daily. (Patient taking differently: Take 40 mg by mouth daily as needed. ), Disp: 90 capsule, Rfl: 3 .  ciprofloxacin (CIPRO) 250 MG tablet, Take 1 tablet (250 mg total) by mouth 2 (two) times daily. For 5 days, Disp: 10 tablet, Rfl: 0 .  cyclobenzaprine (FLEXERIL) 5 MG tablet, Take 1 tablet (5 mg total) by mouth 3 (three) times daily as needed for muscle spasms. (Patient not taking: Reported on 07/14/2019), Disp: 15 tablet, Rfl: 0  Depression screen [facility] 2/9 12/02/2018 11/11/2018 07/12/2018  Decreased Interest 0 0 0   Down, Depressed, Hopeless 0 0 0  PHQ - 2 Score 0 0 0  Altered sleeping 0 - -  Tired, decreased energy 0 - -  Change in appetite 0 - -  Feeling bad or failure about yourself  0 - -  Trouble concentrating 0 - -  Moving slowly or fidgety/restless 0 - -  Suicidal thoughts 0 - -  PHQ-9 Score 0 - -  Difficult doing work/chores Not difficult at all - -    GAD 7 : Generalized Anxiety Score 11/09/2017 10/20/2017  Nervous, Anxious, on Edge 1 3  Control/stop worrying 3 3  Worry too much - different things 2 0  Trouble relaxing 0 1  Restless 0 3  Easily annoyed or irritable 3 2  Afraid - awful might happen 1 3  Total GAD 7 Score 10 15  Anxiety Difficulty Somewhat difficult Very difficult    -------------------------------------------------------------------------- O: No physical exam performed due to remote telephone encounter.  Lab results reviewed.  Recent Results (from the past 2160 hour(s))  LDL cholesterol, direct     Status: Abnormal   Collection Time: 06/22/19  2:24 PM  Result Value Ref Range   LDL Direct 196 (H) 0 - 99 mg/dL  Lipid panel     Status: Abnormal   Collection Time: 06/22/19  2:24 PM  Result Value Ref Range   Cholesterol, Total 267 (H) 100 - 199 mg/dL   Triglycerides 045161 (H) 0 - 149 mg/dL   HDL 50 >40>39 mg/dL   VLDL Cholesterol Cal 32 5 - 40 mg/dL   LDL Calculated 981185 (H) 0 - 99 mg/dL   Comment: Comment     Comment: Possible Familial Hypercholesterolemia. FH should be suspected when fasting LDL cholesterol is above 189 mg/dL or non-HDL cholesterol is above 219 mg/dL. A family history of high cholesterol and heart disease in 1st degree relatives should be collected. J Clin Lipidol 2011;5:133-140    Chol/HDL Ratio 5.3 (H) 0.0 - 4.4 ratio    Comment:                                   T. Chol/HDL Ratio                                             Men  Women                               1/2 Avg.Risk  3.4    3.3                                   Avg.Risk  5.0     4.4                                2X Avg.Risk  9.6    7.1                                3X Avg.Risk 23.4   11.0   CBC     Status: None   Collection Time: 06/22/19  2:24 PM  Result Value Ref Range   WBC 8.7 3.4 - 10.8 x10E3/uL   RBC 4.53 3.77 - 5.28 x10E6/uL   Hemoglobin 13.3 11.1 - 15.9 g/dL   Hematocrit 40.4 34.0 - 46.6 %   MCV 89 79 - 97 fL   MCH 29.4 26.6 - 33.0 pg   MCHC 32.9 31.5 - 35.7 g/dL   RDW 14.1 11.7 - 15.4 %   Platelets 295 150 - 450 x10E3/uL  Comprehensive metabolic panel     Status: None   Collection Time: 06/22/19  2:24 PM  Result Value Ref Range   Glucose 95 65 - 99 mg/dL   BUN 14 8 - 27 mg/dL   Creatinine, Ser 0.80 0.57 - 1.00 mg/dL   GFR calc non Af Amer 67 >59 mL/min/1.73   GFR calc Af Amer 78 >59 mL/min/1.73   BUN/Creatinine Ratio 18 12 - 28   Sodium 140 134 - 144 mmol/L   Potassium 4.4 3.5 - 5.2 mmol/L   Chloride 101 96 - 106 mmol/L   CO2 23 20 - 29 mmol/L   Calcium 9.3 8.7 - 10.3 mg/dL   Total Protein 6.5 6.0 - 8.5 g/dL   Albumin 4.0 3.6 - 4.6 g/dL   Globulin, Total 2.5 1.5 - 4.5 g/dL   Albumin/Globulin Ratio 1.6 1.2 - 2.2   Bilirubin Total 0.3 0.0 - 1.2 mg/dL   Alkaline Phosphatase 75 39 - 117 IU/L   AST 19 0 - 40 IU/L   ALT 15 0 - 32 IU/L  TSH     Status: Abnormal   Collection Time: 06/22/19  2:24 PM  Result Value Ref Range   TSH 0.219 (L) 0.450 - 4.500 uIU/mL  T4, free     Status: None   Collection Time: 06/22/19  2:24 PM  Result Value Ref Range   Free T4 1.36 0.82 - 1.77 ng/dL  Specimen status report     Status: None   Collection Time: 06/22/19  2:24 PM  Result Value Ref Range   specimen status report Comment     Comment: Written Authorization Written Authorization Written Authorization Received. Authorization received from [facility] 06-27-2019 Logged by Pecolia Ades   POCT Urinalysis Dipstick     Status: Abnormal   Collection Time: 07/14/19  1:34 PM  Result Value Ref Range   Color, UA yellow    Clarity, UA clear     Glucose, UA Negative Negative   Bilirubin, UA neg    Ketones, UA negative    Spec Grav, UA 1.010 1.010 - 1.025   Blood, UA negative    pH, UA 5.0 5.0 - 8.0   Protein, UA Negative Negative   Urobilinogen, UA 0.2 0.2 or 1.0 E.U./dL   Nitrite, UA negative    Leukocytes, UA Large (3+) (A) Negative   Appearance     Odor      -------------------------------------------------------------------------- A&P:  Problem List Items Addressed This Visit  None    Visit Diagnoses    Acute cystitis without hematuria    -  Primary   Relevant Medications   ciprofloxacin (CIPRO) 250 MG tablet   Other Relevant Orders   Urine Culture   Dysuria       Relevant Orders   POCT Urinalysis Dipstick (Completed)   Urine Culture     Clinically consistent with UTI and confirmed on UA. No recent UTIs or abx courses, last >6 months ago. No concern for pyelo today (no systemic symptoms, neg fever, back pain, n/v).  Plan: 1. UA with leuks, no blood - notified patient she will f/u with urology on microhematuria 2. Ordered Urine culture 3. Cipro 250mg  BID x 5 days 4. Improve PO hydration 5. RTC if no improvement 1-2 weeks, red flags given to return sooner    Meds ordered this encounter  Medications  . ciprofloxacin (CIPRO) 250 MG tablet    Sig: Take 1 tablet (250 mg total) by mouth 2 (two) times daily. For 5 days    Dispense:  10 tablet    Refill:  0    Follow-up: As needed  Patient verbalizes understanding with the above medical recommendations including the limitation of remote medical advice.  Specific follow-up and call-back criteria were given for patient to follow-up or seek medical care more urgently if needed.   - Time spent in direct consultation with patient on phone: 11 minutes  Saralyn PilarAlexander Terrill Wauters, DO [facility] 07/14/2019, 1:40 PM

## 2019-07-14 NOTE — Patient Instructions (Signed)
AVS info given to patient.

## 2019-07-16 LAB — URINE CULTURE
MICRO NUMBER:: 678759
SPECIMEN QUALITY:: ADEQUATE

## 2019-07-20 ENCOUNTER — Telehealth: Payer: Self-pay

## 2019-07-20 DIAGNOSIS — N3000 Acute cystitis without hematuria: Secondary | ICD-10-CM

## 2019-07-20 MED ORDER — CIPROFLOXACIN HCL 250 MG PO TABS: 250.0000 mg | ORAL_TABLET | Freq: Two times a day (BID) | ORAL | 0 refills | Status: DC

## 2019-07-20 NOTE — Telephone Encounter (Signed)
Patient advised.

## 2019-07-20 NOTE — Telephone Encounter (Signed)
Patient called and she is still having a hard time emptying her bladder.  The pain has gotten better but thinks she need more abx.  [facility].

## 2019-07-20 NOTE — Telephone Encounter (Signed)
Reviewed chart.   Please let her know that I sent 3 additional days of Cipro one pill twice a day. That is total 8 days therapy.  If still having difficulty or symptoms unresolved she can contact her Urologist next for further evaluation.  Nobie Putnam, [facility] 07/20/2019, 12:41 PM

## 2019-07-24 ENCOUNTER — Telehealth: Payer: Self-pay | Admitting: *Deleted

## 2019-07-24 NOTE — Telephone Encounter (Signed)
Results called to pt. Pt verbalized understanding. She is aware of her upcoming appointment with Dr Rockey Situ in August.

## 2019-07-24 NOTE — Telephone Encounter (Signed)
-----   Message from Theora Gianotti, NP sent at 07/24/2019  8:25 AM EDT ----- Few, short episodes of fast heart beats and rates stemming from the top chambers of the heart (SVT/atrial tachycardia).  The longest was only 14 beats.  These are benign and did not appear to be associated with symptoms (no trigger submitted).  Avg HR overall was 70.  It's possible that these brief episodes of tachycardia might cause her to feel on edge and explain why she sees elevated hr's on her home monitor, but these episodes are so brief - just a few secs - that it seems unlikely - at least as far as finding elevated hr's goes.  Cont current dose of metoprolol.  Dose has been limited in the past by slow heart beats.  We could consider a retrial of a full tab (25mg  bid) if frequency of episodes increase (only 8 brief episodes over the 2 wks).

## 2019-08-02 ENCOUNTER — Other Ambulatory Visit: Payer: Self-pay

## 2019-08-02 ENCOUNTER — Other Ambulatory Visit: Payer: Medicare PPO

## 2019-08-02 DIAGNOSIS — Z0000 Encounter for general adult medical examination without abnormal findings: Secondary | ICD-10-CM | POA: Diagnosis not present

## 2019-08-02 DIAGNOSIS — R7303 Prediabetes: Secondary | ICD-10-CM

## 2019-08-02 DIAGNOSIS — I10 Essential (primary) hypertension: Secondary | ICD-10-CM | POA: Diagnosis not present

## 2019-08-02 DIAGNOSIS — E032 Hypothyroidism due to medicaments and other exogenous substances: Secondary | ICD-10-CM | POA: Diagnosis not present

## 2019-08-02 DIAGNOSIS — E782 Mixed hyperlipidemia: Secondary | ICD-10-CM

## 2019-08-07 ENCOUNTER — Telehealth: Payer: Self-pay | Admitting: Family Medicine

## 2019-08-07 NOTE — Telephone Encounter (Signed)
I left a message with the patient's husband asking her to call and let us know if tomorrow's appointment can be changed to a virtual visit instead of office visit. VDM (DD)

## 2019-08-08 ENCOUNTER — Ambulatory Visit (INDEPENDENT_AMBULATORY_CARE_PROVIDER_SITE_OTHER): Payer: Medicare PPO

## 2019-08-08 DIAGNOSIS — Z0000 Encounter for general adult medical examination without abnormal findings: Secondary | ICD-10-CM | POA: Diagnosis not present

## 2019-08-08 NOTE — Patient Instructions (Signed)
Stacey Duncan , Thank you for taking time to come for your Medicare Wellness Visit. I appreciate your ongoing commitment to your health goals. Please review the following plan we discussed and let me know if I can assist you in the future.   Screening recommendations/referrals: Colonoscopy: no longer required Mammogram: completed 07/08/2018 Bone Density: completed Recommended yearly ophthalmology/optometry visit for glaucoma screening and checkup Recommended yearly dental visit for hygiene and checkup  Vaccinations: Influenza vaccine: due 08/2019 Pneumococcal vaccine: up to date Tdap vaccine: up to date Shingles vaccine: shingrix eligible, check with your insurance company for coverage     Advanced directives: please let me know if you have any questions or need assistance with this paperwork   Conditions/risks identified: Fall preventions discussed  Next appointment: follow up in one year for your annual wellness visit. [place] 65 Years and Older, Female Preventive care refers to lifestyle choices and visits with your health care provider that can promote health and wellness. What does preventive care include?  A yearly physical exam. This is also called an annual well check.  Dental exams once or twice a year.  Routine eye exams. Ask your health care provider how often you should have your eyes checked.  Personal lifestyle choices, including:  Daily care of your teeth and gums.  Regular physical activity.  Eating a healthy diet.  Avoiding tobacco and drug use.  Limiting alcohol use.  Practicing safe sex.  Taking low-dose aspirin every day.  Taking vitamin and mineral supplements as recommended by your health care provider. What happens during an annual well check? The services and screenings done by your health care provider during your annual well check will depend on your age, overall health, lifestyle risk factors, and family history of disease.  Counseling  Your health care provider may ask you questions about your:  Alcohol use.  Tobacco use.  Drug use.  Emotional well-being.  Home and relationship well-being.  Sexual activity.  Eating habits.  History of falls.  Memory and ability to understand (cognition).  Work and work Statistician.  Reproductive health. Screening  You may have the following tests or measurements:  Height, weight, and BMI.  Blood pressure.  Lipid and cholesterol levels. These may be checked every 5 years, or more frequently if you are over 66 years old.  Skin check.  Lung cancer screening. You may have this screening every year starting at age 36 if you have a 30-pack-year history of smoking and currently smoke or have quit within the past 15 years.  Fecal occult blood test (FOBT) of the stool. You may have this test every year starting at age 70.  Flexible sigmoidoscopy or colonoscopy. You may have a sigmoidoscopy every 5 years or a colonoscopy every 10 years starting at age 47.  Hepatitis C blood test.  Hepatitis B blood test.  Sexually transmitted disease (STD) testing.  Diabetes screening. This is done by checking your blood sugar (glucose) after you have not eaten for a while (fasting). You may have this done every 1-3 years.  Bone density scan. This is done to screen for osteoporosis. You may have this done starting at age 20.  Mammogram. This may be done every 1-2 years. Talk to your health care provider about how often you should have regular mammograms. Talk with your health care provider about your test results, treatment options, and if necessary, the need for more tests. Vaccines  Your health care provider may recommend certain vaccines, such  as:  Influenza vaccine. This is recommended every year.  Tetanus, diphtheria, and acellular pertussis (Tdap, Td) vaccine. You may need a Td booster every 10 years.  Zoster vaccine. You may need this after age 59.   Pneumococcal 13-valent conjugate (PCV13) vaccine. One dose is recommended after age 25.  Pneumococcal polysaccharide (PPSV23) vaccine. One dose is recommended after age 90. Talk to your health care provider about which screenings and vaccines you need and how often you need them. This information is not intended to replace advice given to you by your health care provider. Make sure you discuss any questions you have with your health care provider. Document Released: 01/10/2016 Document Revised: 09/02/2016 Document Reviewed: 10/15/2015 Elsevier Interactive Patient Education  2017 [employer] Prevention in the Home Falls can cause injuries. They can happen to people of all ages. There are many things you can do to make your home safe and to help prevent falls. What can I do on the outside of my home?  Regularly fix the edges of walkways and driveways and fix any cracks.  Remove anything that might make you trip as you walk through a door, such as a raised step or threshold.  Trim any bushes or trees on the path to your home.  Use bright outdoor lighting.  Clear any walking paths of anything that might make someone trip, such as rocks or tools.  Regularly check to see if handrails are loose or broken. Make sure that both sides of any steps have handrails.  Any raised decks and porches should have guardrails on the edges.  Have any leaves, snow, or ice cleared regularly.  Use sand or salt on walking paths during winter.  Clean up any spills in your garage right away. This includes oil or grease spills. What can I do in the bathroom?  Use night lights.  Install grab bars by the toilet and in the tub and shower. Do not use towel bars as grab bars.  Use non-skid mats or decals in the tub or shower.  If you need to sit down in the shower, use a plastic, non-slip stool.  Keep the floor dry. Clean up any water that spills on the floor as soon as it happens.  Remove soap  buildup in the tub or shower regularly.  Attach bath mats securely with double-sided non-slip rug tape.  Do not have throw rugs and other things on the floor that can make you trip. What can I do in the bedroom?  Use night lights.  Make sure that you have a light by your bed that is easy to reach.  Do not use any sheets or blankets that are too big for your bed. They should not hang down onto the floor.  Have a firm chair that has side arms. You can use this for support while you get dressed.  Do not have throw rugs and other things on the floor that can make you trip. What can I do in the kitchen?  Clean up any spills right away.  Avoid walking on wet floors.  Keep items that you use a lot in easy-to-reach places.  If you need to reach something above you, use a strong step stool that has a grab bar.  Keep electrical cords out of the way.  Do not use floor polish or wax that makes floors slippery. If you must use wax, use non-skid floor wax.  Do not have throw rugs and other things on the  floor that can make you trip. What can I do with my stairs?  Do not leave any items on the stairs.  Make sure that there are handrails on both sides of the stairs and use them. Fix handrails that are broken or loose. Make sure that handrails are as long as the stairways.  Check any carpeting to make sure that it is firmly attached to the stairs. Fix any carpet that is loose or worn.  Avoid having throw rugs at the top or bottom of the stairs. If you do have throw rugs, attach them to the floor with carpet tape.  Make sure that you have a light switch at the top of the stairs and the bottom of the stairs. If you do not have them, ask someone to add them for you. What else can I do to help prevent falls?  Wear shoes that:  Do not have high heels.  Have rubber bottoms.  Are comfortable and fit you well.  Are closed at the toe. Do not wear sandals.  If you use a stepladder:  Make  sure that it is fully opened. Do not climb a closed stepladder.  Make sure that both sides of the stepladder are locked into place.  Ask someone to hold it for you, if possible.  Clearly mark and make sure that you can see:  Any grab bars or handrails.  First and last steps.  Where the edge of each step is.  Use tools that help you move around (mobility aids) if they are needed. These include:  Canes.  Walkers.  Scooters.  Crutches.  Turn on the lights when you go into a dark area. Replace any light bulbs as soon as they burn out.  Set up your furniture so you have a clear path. Avoid moving your furniture around.  If any of your floors are uneven, fix them.  If there are any pets around you, be aware of where they are.  Review your medicines with your doctor. Some medicines can make you feel dizzy. This can increase your chance of falling. Ask your doctor what other things that you can do to help prevent falls. This information is not intended to replace advice given to you by your health care provider. Make sure you discuss any questions you have with your health care provider. Document Released: 10/10/2009 Document Revised: 05/21/2016 Document Reviewed: 01/18/2015 Elsevier Interactive Patient Education  2017 [employer].

## 2019-08-08 NOTE — Progress Notes (Signed)
Subjective:   Stacey Duncan is a 85 y.o. female who presents for Medicare Annual (Subsequent) preventive examination.  This visit is being conducted via phone call  - after an attmept to do on video chat - due to the COVID-19 pandemic. This patient has given me verbal consent via phone to conduct this visit, patient states they are participating from their home address. Some vital signs may be absent or patient reported.   Patient identification: identified by name, DOB, and current address.    Review of Systems:   Cardiac Risk Factors include: advanced age (>1755men, 98>65 women);hypertension;dyslipidemia     Objective:     Vitals: There were no vitals taken for this visit.  There is no height or weight on file to calculate BMI.  Advanced Directives 08/08/2019 07/12/2018 05/27/2018 10/15/2017 10/15/2017 07/20/2017 03/13/2016  Does Patient Have a Medical Advance Directive? No No No No No No No  Would patient like information on creating a medical advance directive? - Yes (MAU/Ambulatory/Procedural Areas - Information given) - Yes (Inpatient - patient requests chaplain consult to create a medical advance directive) - Yes (MAU/Ambulatory/Procedural Areas - Information given) No - patient declined information    Tobacco Social History   Tobacco Use  Smoking Status Never Smoker  Smokeless Tobacco Never Used     Counseling given: Not Answered   Clinical Intake:  Pre-visit preparation completed: Yes  Pain : 0-10 Pain Score: 4  Pain Type: Chronic pain Pain Location: Leg Pain Orientation: Right Pain Descriptors / Indicators: Aching Pain Onset: More than a month ago Pain Frequency: Constant     Nutritional Risks: None Diabetes: No  How often do you need to have someone help you when you read instructions, pamphlets, or other written materials from your doctor or pharmacy?: 1 - Never  Interpreter Needed?: No  Information entered by :: Tiffany Hill,LPN  Past Medical  History:  Diagnosis Date  . Anxiety   . Fibromyalgia   . GERD (gastroesophageal reflux disease)   . History of echocardiogram    a. 02/2016 Echo: EF 60-65%, no rwma. Nl RV fxn and PASP.  Marland Kitchen. HTN (hypertension)   . Hypothyroidism   . Mixed hyperlipidemia   . Osteoarthritis    a. bilateral knees  . PAF (paroxysmal atrial fibrillation) (HCC)    a. 02/2015 & 09/2017 - admit w/ Afib, briefly req Amio; b. CHA2DSVASc = 4-->Eliquis.  . Recurrent UTI (urinary tract infection)   . Sinus bradycardia    a. Has limited beta blocker dosing.   Past Surgical History:  Procedure Laterality Date  . ABDOMINAL HYSTERECTOMY    . CHOLECYSTECTOMY    . ROTATOR CUFF REPAIR     left    Family History  Problem Relation Age of Onset  . Heart disease Mother [id number]       CABG  . Heart attack Maternal Grandfather   . Heart attack Maternal Grandmother    Social History   Socioeconomic History  . Marital status: Married    Spouse name: Not on file  . Number of children: Not on file  . Years of education: Not on file  . Highest education level: Bachelor's degree (e.g., BA, AB, BS)  Occupational History  . Not on file  Social Needs  . Financial resource strain: Not hard at all  . Food insecurity    Worry: Never true    Inability: Never true  . Transportation needs    Medical: No    Non-medical: No  Tobacco Use  . Smoking status: Never Smoker  . Smokeless tobacco: Never Used  Substance and Sexual Activity  . Alcohol use: Yes    Alcohol/week: 7.0 standard drinks    Types: 7 Glasses of wine per week    Comment: glass of wine daily  . Drug use: No  . Sexual activity: Not Currently    Birth control/protection: Post-menopausal  Lifestyle  . Physical activity    Days per week: 0 days    Minutes per session: 0 min  . Stress: Not at all  Relationships  . Social connections    Talks on phone: More than three times a week    Gets together: More than three times a week    Attends religious service:  More than 4 times per year    Active member of club or organization: No    Attends meetings of clubs or organizations: Never    Relationship status: Married  Other Topics Concern  . Not on file  Social History Narrative  . Not on file    Outpatient Encounter Medications as of 08/08/2019  Medication Sig  . atorvastatin (LIPITOR) 80 MG tablet Take 1 tablet (80 mg total) by mouth daily at 6 PM.  . Cholecalciferol (VITAMIN D-1000 MAX ST) 1000 UNITS tablet Take 1,000 Units by mouth daily.   . cyclobenzaprine (FLEXERIL) 5 MG tablet Take 1 tablet (5 mg total) by mouth 3 (three) times daily as needed for muscle spasms.  Marland Kitchen. ELIQUIS 5 MG TABS tablet TAKE ONE (1) TABLET BY MOUTH TWO (2) TIMES DAILY  . FLUoxetine (PROZAC) 40 MG capsule Take 1 capsule (40 mg total) by mouth daily.  Marland Kitchen. gabapentin (NEURONTIN) 100 MG capsule Take 100 mg by mouth 2 (two) times daily.   . hydrALAZINE (APRESOLINE) 25 MG tablet Take 1 tablet (25 mg total) by mouth 2 (two) times daily.  Marland Kitchen. levothyroxine (SYNTHROID) 100 MCG tablet TAKE 1 TABLET EVERY DAY  . metoprolol tartrate (LOPRESSOR) 25 MG tablet TAKES 1/2 TABLET TWICE A DAY  . omeprazole (PRILOSEC) 40 MG capsule Take 1 capsule (40 mg total) by mouth daily. (Patient taking differently: Take 40 mg by mouth daily as needed. )  . [DISCONTINUED] ciprofloxacin (CIPRO) 250 MG tablet Take 1 tablet (250 mg total) by mouth 2 (two) times daily. For 3 additional days (Patient not taking: Reported on 08/08/2019)   No facility-administered encounter medications on file as of 08/08/2019.     Activities of Daily Living In your present state of health, do you have any difficulty performing the following activities: 08/08/2019  Hearing? N  Comment no hearing aids  Vision? N  Difficulty concentrating or making decisions? N  Walking or climbing stairs? Y  Dressing or bathing? N  Doing errands, shopping? Y  Comment family helps  Preparing Food and eating ? N  Using the Toilet? N  In the  past six months, have you accidently leaked urine? N  Do you have problems with loss of bowel control? N  Managing your Medications? N  Managing your Finances? N  Housekeeping or managing your Housekeeping? N  Some recent data might be hidden    Patient Care Team: Smitty CordsKaramalegos, Alexander J, DO as PCP - General (Family Medicine) Mariah MillingGollan, Tollie Pizzaimothy J, MD as PCP - Cardiology (Cardiology) Antonieta IbaGollan, Timothy J, MD as Consulting Physician (Cardiology) Riki AltesStoioff, Scott C, MD (Urology) Juanell FairlyKrasinski, Kevin, MD as Referring Physician (Orthopedic Surgery)    Assessment:   This is a routine wellness examination for Stacey Hashimotoatricia.  Exercise  Activities and Dietary recommendations Current Exercise Habits: The patient does not participate in regular exercise at present, Exercise limited by: None identified  Goals    . DIET - INCREASE WATER INTAKE     Recommend drinking at least 6-8 glasses of water a day     . Increase water intake     Recommend continue drinking at least 5-6 glasses of water a day        Fall Risk: Fall Risk  08/08/2019 12/02/2018 11/11/2018 07/12/2018 11/09/2017  Falls in the past year? 0 0 0 No Yes  Number falls in past yr: - - - - 2 or more  Risk Factor Category  - - - - High Fall Risk  Risk for fall due to : - - - - History of fall(s)  Follow up - Falls evaluation completed Falls evaluation completed - -    FALL RISK PREVENTION PERTAINING TO THE HOME:  Any stairs in or around the home? Yes  If so, are there any without handrails? No   Home free of loose throw rugs in walkways, pet beds, electrical cords, etc? Yes  Adequate lighting in your home to reduce risk of falls? Yes   ASSISTIVE DEVICES UTILIZED TO PREVENT FALLS:  Life alert? No  Use of a cane, walker or w/c? Yes  Grab bars in the bathroom? No  Shower chair or bench in shower? No  Elevated toilet seat or a handicapped toilet? Yes   DME ORDERS:  DME order needed?  No   TIMED UP AND GO:  Unable to perform     Depression Screen PHQ 2/9 Scores 08/08/2019 12/02/2018 11/11/2018 07/12/2018  PHQ - 2 Score 1 0 0 0  PHQ- 9 Score - 0 - -     Cognitive Function     6CIT Screen 07/12/2018 07/20/2017  What Year? 0 points 0 points  What month? 0 points 0 points  What time? 0 points 0 points  Count back from 20 0 points 0 points  Months in reverse 0 points 0 points  Repeat phrase 0 points 0 points  Total Score 0 0    Immunization History  Administered Date(s) Administered  . Influenza, High Dose Seasonal PF 09/30/2015, 10/22/2016, 10/20/2017, 10/26/2018    Qualifies for Shingles Vaccine? Yes  Zostavax completed n/a. Due for Shingrix. Education has been provided regarding the importance of this vaccine. Pt has been advised to call insurance company to determine out of pocket expense. Advised may also receive vaccine at local pharmacy or Health Dept. Verbalized acceptance and understanding.  Tdap: up to date   Flu Vaccine: Due 08/2019  Pneumococcal Vaccine:up to date   Screening Tests Health Maintenance  Topic Date Due  . INFLUENZA VACCINE  07/29/2019  . TETANUS/TDAP  02/08/2025  . DEXA SCAN  Completed  . PNA vac Low Risk Adult  Completed    Cancer Screenings:  Colorectal Screening: no longer required  Mammogram: completed 07/08/2018, pt requested to due every 2 years   Bone Density: Completed 09/28/1999  Lung Cancer Screening: (Low Dose CT Chest recommended if Age 32-80 years, 30 pack-year currently smoking OR have quit w/in 15years.) does not qualify.     Additional Screening:  Hepatitis C Screening: does not qualify  Vision Screening: Recommended annual ophthalmology exams for early detection of glaucoma and other disorders of the eye. Is the patient up to date with their annual eye exam?  No    Dental Screening: Recommended annual dental exams for proper oral hygiene  Community Resource Referral:  CRR required this visit?  No       Plan:  I have personally reviewed and  addressed the Medicare Annual Wellness questionnaire and have noted the following in the patient's chart:  A. Medical and social history B. Use of alcohol, tobacco or illicit drugs  C. Current medications and supplements D. Functional ability and status E.  Nutritional status F.  Physical activity G. Advance directives H. List of other physicians I.  Hospitalizations, surgeries, and ER visits in previous 12 months J.  Vitals K. Screenings such as hearing and vision if needed, cognitive and depression L. Referrals and appointments   In addition, I have reviewed and discussed with patient certain preventive protocols, quality metrics, and best practice recommendations. A written personalized care plan for preventive services as well as general preventive health recommendations were provided to patient.  Signed,    Collene SchlichterHill, Tiffany A, LPN  1/61/09608/10/2019 Nurse Health Advisor   Nurse Notes: none

## 2019-08-09 ENCOUNTER — Ambulatory Visit (INDEPENDENT_AMBULATORY_CARE_PROVIDER_SITE_OTHER): Payer: Medicare PPO | Admitting: Family Medicine

## 2019-08-09 ENCOUNTER — Other Ambulatory Visit: Payer: Self-pay

## 2019-08-09 ENCOUNTER — Encounter: Payer: Self-pay | Admitting: Family Medicine

## 2019-08-09 VITALS — BP 119/49 | HR 59 | Temp 98.8°F | Resp 16 | Ht 63.0 in | Wt 151.0 lb

## 2019-08-09 DIAGNOSIS — M5416 Radiculopathy, lumbar region: Secondary | ICD-10-CM | POA: Diagnosis not present

## 2019-08-09 DIAGNOSIS — M545 Low back pain: Secondary | ICD-10-CM | POA: Diagnosis not present

## 2019-08-09 DIAGNOSIS — E032 Hypothyroidism due to medicaments and other exogenous substances: Secondary | ICD-10-CM

## 2019-08-09 DIAGNOSIS — K219 Gastro-esophageal reflux disease without esophagitis: Secondary | ICD-10-CM

## 2019-08-09 DIAGNOSIS — F339 Major depressive disorder, recurrent, unspecified: Secondary | ICD-10-CM | POA: Diagnosis not present

## 2019-08-09 DIAGNOSIS — R7303 Prediabetes: Secondary | ICD-10-CM

## 2019-08-09 DIAGNOSIS — E782 Mixed hyperlipidemia: Secondary | ICD-10-CM

## 2019-08-09 DIAGNOSIS — I480 Paroxysmal atrial fibrillation: Secondary | ICD-10-CM

## 2019-08-09 DIAGNOSIS — F411 Generalized anxiety disorder: Secondary | ICD-10-CM

## 2019-08-09 DIAGNOSIS — I10 Essential (primary) hypertension: Secondary | ICD-10-CM

## 2019-08-09 DIAGNOSIS — Z0000 Encounter for general adult medical examination without abnormal findings: Secondary | ICD-10-CM

## 2019-08-09 LAB — HEPATIC FUNCTION PANEL
ALT: 13 (ref 7–35)
AST: 17 (ref 13–35)
Alkaline Phosphatase: 69 (ref 25–125)
Bilirubin, Total: 0.6

## 2019-08-09 LAB — LIPID PANEL
Cholesterol: 163 (ref 0–200)
HDL: 49 (ref 35–70)
LDL Cholesterol: 93
Triglycerides: 115 (ref 40–160)

## 2019-08-09 LAB — HEMOGLOBIN A1C: Hemoglobin A1C: 5.7

## 2019-08-09 LAB — TSH: TSH: 0.89 (ref 0.41–5.90)

## 2019-08-09 LAB — T4, FREE: Free T4: 1.3 (ref 0.8–1.8)

## 2019-08-09 MED ORDER — OMEPRAZOLE 40 MG PO CPDR: 40.0000 mg | DELAYED_RELEASE_CAPSULE | Freq: Every day | ORAL | 1 refills | Status: DC | PRN

## 2019-08-09 MED ORDER — FLUOXETINE HCL 40 MG PO CAPS: 40.0000 mg | ORAL_CAPSULE | Freq: Every day | ORAL | 1 refills | Status: DC

## 2019-08-09 NOTE — Assessment & Plan Note (Signed)
Improved. Now episodic indigestion possibly dyspepsia side effect from Prozac Re order Omeprazole 40mg  daily PRN

## 2019-08-09 NOTE — Assessment & Plan Note (Signed)
Well-controlled Pre-DM with A1c 5.7  Plan:  1. Not on any therapy currently  2. Encourage improved lifestyle - low carb, low sugar diet, reduce portion size, continue improving regular exercise as tolerated 

## 2019-08-09 NOTE — Assessment & Plan Note (Addendum)
Significantly improved now Controlled cholesterol on statin and lifestyle Last lipid panel 07/2019  LFTs reviewed normal  Plan: 1. Continue current meds - Atorvastatin 80mg  daily 2. On anticoagulation AFib 3,. Encourage improved lifestyle - low carb/cholesterol, reduce portion size, continue improving regular exercise F/u w/ Cardiology

## 2019-08-09 NOTE — Assessment & Plan Note (Signed)
Improved TSH, normal Free T4 Maintained on Levothyroxine 136mcg daily, continue current dose Follow-up 6 months

## 2019-08-09 NOTE — Progress Notes (Addendum)
Subjective:    Patient ID: Stacey Duncan, female    DOB: 1931-05-14, 85 y.o.   MRN: [id number]  Stacey Duncan is a 85 y.o. female presenting on 08/09/2019 for Annual Exam   HPI   Here for Annual Physical and Lab Review. Note - labs from [facility] [facility] did not automatically result in chart. Issue with an account number. We called and they faxed results to [place] today, abstracted manually into her chart today and patient given a copy of her labs to bring to Cardiology next week.  Pre-Diabetes Lab result A1c 5.7, previous 5.6 to 5.7 CBG not checking Meds:none Currently on ARB Diet: improved diet. Low carb. Exercise: limited activity due to pain and fibromyalgia, increased tiredness Denies hypoglycemia, urinary frequency, polyuria  Hypothyroidism Recent trend lab showed low TSH previously x 2, discussed with Cardiology based on that result, we decided to maintain dose Levothyroxine 124mcg daily and re-check since patient was clinically euthyroid. Result now shows TSH 0.89 with improvement and normal Free T4. Doing well, continues on Levothyroxine 167mcg daily  Hypertension / Paroxysmal Atrial Fibrillation / HYPERLIPIDEMIA: - Reports no concerns. Last lipid panel 07/2019, significantly improved lipids now with LDL down to 93 on higher potency statin - Currently taking Atorvastatin 80mg  daily, tolerating well without side effects or myalgias  - on anticoagulation Eliquis, Metoprolol rate control She will follow up with Cardiology as planned next week.  Fibromyalgia / Osteoarthritis Followed by Pain Management / Ortho. Chronic pain symptoms multiple areas of body, including low back  GERD Reports has side effect of dyspepsia indigestion sometimes after taking Prozac medication. Regardless of meal, trying to take PPI intermittent, Omeprazole 40mg  daily PRN needs refill. Tried OTC Tums as well to help mild relief.  Major Depression, chronic recurrent Chronic problem. Several life  stressors with family and current COVID19 situation. Otherwise she is doing well currently on medicine. Prozac 40mg  daily works for her. She is not interested in switching even though she thinks it can cause indigestion. Previously failed Sertraline.    Depression screen [facility] 2/9 08/09/2019 08/08/2019 12/02/2018  Decreased Interest 0 0 0  Down, Depressed, Hopeless 0 1 0  PHQ - 2 Score 0 1 0  Altered sleeping 0 - 0  Tired, decreased energy 3 - 0  Change in appetite 0 - 0  Feeling bad or failure about yourself  0 - 0  Trouble concentrating 0 - 0  Moving slowly or fidgety/restless 0 - 0  Suicidal thoughts 0 - 0  PHQ-9 Score 3 - 0  Difficult doing work/chores Not difficult at all - Not difficult at all  Some recent data might be hidden      Past Medical History:  Diagnosis Date  . Anxiety   . Fibromyalgia   . GERD (gastroesophageal reflux disease)   . History of echocardiogram    a. 02/2016 Echo: EF 60-65%, no rwma. Nl RV fxn and PASP.  Marland Kitchen HTN (hypertension)   . Hypothyroidism   . Mixed hyperlipidemia   . Osteoarthritis    a. bilateral knees  . PAF (paroxysmal atrial fibrillation) ([place])    a. 02/2015 & 09/2017 - admit w/ Afib, briefly req Amio; b. CHA2DSVASc = 4-->Eliquis.  . Recurrent UTI (urinary tract infection)   . Sinus bradycardia    a. Has limited beta blocker dosing.   Past Surgical History:  Procedure Laterality Date  . ABDOMINAL HYSTERECTOMY    . CHOLECYSTECTOMY    . ROTATOR CUFF REPAIR     left  Social History   Socioeconomic History  . Marital status: Married    Spouse name: Not on file  . Number of children: Not on file  . Years of education: Not on file  . Highest education level: Bachelor's degree (e.g., BA, AB, BS)  Occupational History  . Not on file  Social Needs  . Financial resource strain: Not hard at all  . Food insecurity    Worry: Never true    Inability: Never true  . Transportation needs    Medical: No    Non-medical: No  Tobacco Use   . Smoking status: Never Smoker  . Smokeless tobacco: Never Used  Substance and Sexual Activity  . Alcohol use: Yes    Alcohol/week: 7.0 standard drinks    Types: 7 Glasses of wine per week    Comment: glass of wine daily  . Drug use: No  . Sexual activity: Not Currently    Birth control/protection: Post-menopausal  Lifestyle  . Physical activity    Days per week: 0 days    Minutes per session: 0 min  . Stress: Not at all  Relationships  . Social connections    Talks on phone: More than three times a week    Gets together: More than three times a week    Attends religious service: More than 4 times per year    Active member of club or organization: No    Attends meetings of clubs or organizations: Never    Relationship status: Married  . Intimate partner violence    Fear of current or ex partner: No    Emotionally abused: No    Physically abused: No    Forced sexual activity: No  Other Topics Concern  . Not on file  Social History Narrative  . Not on file   Family History  Problem Relation Age of Onset  . Heart disease Mother 5075       CABG  . Heart attack Maternal Grandfather   . Heart attack Maternal Grandmother    Current Outpatient Medications on File Prior to Visit  Medication Sig  . atorvastatin (LIPITOR) 80 MG tablet Take 1 tablet (80 mg total) by mouth daily at 6 PM.  . Cholecalciferol (VITAMIN D-1000 MAX ST) 1000 UNITS tablet Take 1,000 Units by mouth daily.   . cyclobenzaprine (FLEXERIL) 5 MG tablet Take 1 tablet (5 mg total) by mouth 3 (three) times daily as needed for muscle spasms.  Marland Kitchen. ELIQUIS 5 MG TABS tablet TAKE ONE (1) TABLET BY MOUTH TWO (2) TIMES DAILY  . gabapentin (NEURONTIN) 100 MG capsule Take 100 mg by mouth 2 (two) times daily.   . hydrALAZINE (APRESOLINE) 25 MG tablet Take 1 tablet (25 mg total) by mouth 2 (two) times daily.  Marland Kitchen. levothyroxine (SYNTHROID) 100 MCG tablet TAKE 1 TABLET EVERY DAY  . metoprolol tartrate (LOPRESSOR) 25 MG tablet TAKES  1/2 TABLET TWICE A DAY   No current facility-administered medications on file prior to visit.     Review of Systems  Constitutional: Negative for activity change, appetite change, chills, diaphoresis, fatigue and fever.  HENT: Negative for congestion and hearing loss.   Eyes: Negative for visual disturbance.  Respiratory: Negative for cough, chest tightness, shortness of breath and wheezing.   Cardiovascular: Negative for chest pain, palpitations and leg swelling.  Gastrointestinal: Negative for abdominal pain, blood in stool, constipation, diarrhea, nausea and vomiting.       Indigestion  Endocrine: Negative for cold intolerance.  Genitourinary: Negative  for dysuria, frequency and hematuria.  Musculoskeletal: Positive for back pain. Negative for arthralgias and neck pain.  Skin: Negative for rash.  Allergic/Immunologic: Negative for environmental allergies.  Neurological: Negative for dizziness, weakness, light-headedness, numbness and headaches.  Hematological: Negative for adenopathy.  Psychiatric/Behavioral: Negative for behavioral problems, dysphoric mood and sleep disturbance. The patient is not nervous/anxious.    Per HPI unless specifically indicated above      Objective:    BP (!) 119/49   Pulse (!) 59   Temp 98.8 F (37.1 C) (Oral)   Resp 16   Ht 5\' 3"  (1.6 m)   Wt 151 lb (68.5 kg)   BMI 26.75 kg/m   Wt Readings from Last 3 Encounters:  08/09/19 151 lb (68.5 kg)  06/22/19 151 lb 8 oz (68.7 kg)  02/09/19 148 lb (67.1 kg)    Physical Exam Vitals signs and nursing note reviewed.  Constitutional:      General: She is not in acute distress.    Appearance: She is well-developed. She is not diaphoretic.     Comments: Well-appearing, comfortable, cooperative  HENT:     Head: Normocephalic and atraumatic.  Eyes:     General:        Right eye: No discharge.        Left eye: No discharge.     Conjunctiva/sclera: Conjunctivae normal.     Pupils: Pupils are equal,  round, and reactive to light.  Neck:     Musculoskeletal: Normal range of motion and neck supple.     Thyroid: No thyromegaly.  Cardiovascular:     Rate and Rhythm: Normal rate and regular rhythm.     Heart sounds: Normal heart sounds. No murmur.     Comments: Without ectopy or irregular rhythm on exam Pulmonary:     Effort: Pulmonary effort is normal. No respiratory distress.     Breath sounds: Normal breath sounds. No wheezing or rales.  Abdominal:     General: Bowel sounds are normal. There is no distension.     Palpations: Abdomen is soft. There is no mass.     Tenderness: There is no abdominal tenderness.  Musculoskeletal: Normal range of motion.        General: No tenderness.     Comments: Upper / Lower Extremities: - Normal muscle tone, strength bilateral upper extremities 5/5, lower extremities 5/5  Lymphadenopathy:     Cervical: No cervical adenopathy.  Skin:    General: Skin is warm and dry.     Findings: No erythema or rash.  Neurological:     Mental Status: She is alert and oriented to person, place, and time.     Comments: Distal sensation intact to light touch all extremities  Psychiatric:        Behavior: Behavior normal.     Comments: Well groomed, good eye contact, normal speech and thoughts        Results for orders placed or performed in visit on 08/09/19  Lipid panel  Result Value Ref Range   Triglycerides 115 40 - 160   Cholesterol 163 0 - 200   HDL 49 35 - 70   LDL Cholesterol 93   Hepatic function panel  Result Value Ref Range   Alkaline Phosphatase 69 25 - 125   ALT 13 7 - 35   AST 17 13 - 35   Bilirubin, Total 0.6   Hemoglobin A1c  Result Value Ref Range   Hemoglobin A1C 5.7   TSH  Result  Value Ref Range   TSH 0.89 0.41 - 5.90  T4, free  Result Value Ref Range   Free T4 1.3 0.8 - 1.8      Assessment & Plan:   Problem List Items Addressed This Visit    Essential hypertension    Well-controlled HTN. History of low BP in past -  Home BP readings normal  Paroxysmal AFib    Plan:  1. Continue current BP regimen Metoprolol 12.5mg  DAILY 2. Encourage improved lifestyle - low sodium diet, regular exercise 3. Continue monitor BP outside office, bring readings to next visit, if persistently >140/90 or new symptoms notify office sooner      GAD (generalized anxiety disorder)    See A&P Depression      Relevant Medications   FLUoxetine (PROZAC) 40 MG capsule   GERD (gastroesophageal reflux disease)    Improved. Now episodic indigestion possibly dyspepsia side effect from Prozac Re order Omeprazole 40mg  daily PRN      Relevant Medications   omeprazole (PRILOSEC) 40 MG capsule   Hyperlipidemia    Significantly improved now Controlled cholesterol on statin and lifestyle Last lipid panel 07/2019  LFTs reviewed normal  Plan: 1. Continue current meds - Atorvastatin 80mg  daily 2. On anticoagulation AFib 3,. Encourage improved lifestyle - low carb/cholesterol, reduce portion size, continue improving regular exercise F/u w/ Cardiology      Hypothyroidism    Improved TSH, normal Free T4 Maintained on Levothyroxine 100mcg daily, continue current dose Follow-up 6 months      Major depression, recurrent, chronic (HCC)    Stable chronic mood, on SSRI Improved on Prozac Failed Sertraline Supsected secondary insomnia component causing tired/fatigue  Plan Re order Fluoxetine Prozac 40mg  daily, discussed option to switch in future, if having side effect dyspepsia      Relevant Medications   FLUoxetine (PROZAC) 40 MG capsule   Paroxysmal atrial fibrillation (HCC)    Stable without evidence of AFib today on exam Rate control on Metoprolol low dose On anticoagulation Eliquis [facility]      Pre-diabetes    Well-controlled Pre-DM with A1c 5.7  Plan:  1. Not on any therapy currently  2. Encourage improved lifestyle - low carb, low sugar diet, reduce portion size, continue improving regular  exercise as tolerated       Other Visit Diagnoses    Annual physical exam    -  Primary      Updated Health Maintenance information Reviewed recent lab results with patient Encouraged improvement to lifestyle with diet and exercise - Goal of weight loss / maintain weight    Meds ordered this encounter  Medications  . omeprazole (PRILOSEC) 40 MG capsule    Sig: Take 1 capsule (40 mg total) by mouth daily as needed.    Dispense:  90 capsule    Refill:  1  . FLUoxetine (PROZAC) 40 MG capsule    Sig: Take 1 capsule (40 mg total) by mouth daily.    Dispense:  90 capsule    Refill:  1     Follow up plan: Return in about 6 months (around 02/09/2020) for 6 months Thyroid, PreDM A1c.  Future labs anticipated in 01/2020  Saralyn PilarAlexander Karamalegos, DO [facility] 08/09/2019, 2:43 PM

## 2019-08-09 NOTE — Patient Instructions (Addendum)
Thank you for coming to the office today.  Be safe with upcoming trip, and wear mask and use precautions as discussed.  Refilled Prozac and Omeprazole stomach acid to [facility]  If prozac causes upset stomach indigestion, burning, can take OTC Tums and Maalox as needed, we can do a different rx to calm down acid symptoms if need. Also in future we can switch prozac to lexapro or other option if need can cause less.  Thyroid Levothyroxine 132mcg is working, current lab is improved TSH 0.89 now, back to normal range. No change of dose.  A1c 5.7, stable, no new concern on blood sugar  Please schedule a Follow-up Appointment to: Return in about 6 months (around 02/09/2020) for 6 months Thyroid, PreDM A1c.  If you have any other questions or concerns, please feel free to call the office or send a message through [place]. You may also schedule an earlier appointment if necessary.  Additionally, you may be receiving a survey about your experience at our office within a few days to 1 week by e-mail or mail. We value your feedback.  Nobie Putnam, DO [place]

## 2019-08-09 NOTE — Assessment & Plan Note (Signed)
Stable without evidence of AFib today on exam Rate control on Metoprolol low dose On anticoagulation Eliquis Followed by [facility] 

## 2019-08-09 NOTE — Assessment & Plan Note (Signed)
Well-controlled HTN. History of low BP in past - Home BP readings normal  Paroxysmal AFib    Plan:  1. Continue current BP regimen Metoprolol 12.5mg DAILY 2. Encourage improved lifestyle - low sodium diet, regular exercise 3. Continue monitor BP outside office, bring readings to next visit, if persistently >140/90 or new symptoms notify office sooner 

## 2019-08-09 NOTE — Assessment & Plan Note (Signed)
See A&P Depression 

## 2019-08-09 NOTE — Assessment & Plan Note (Signed)
Stable chronic mood, on SSRI Improved on Prozac Failed Sertraline Supsected secondary insomnia component causing tired/fatigue  Plan Re order Fluoxetine Prozac 40mg  daily, discussed option to switch in future, if having side effect dyspepsia
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
## 2019-08-14 ENCOUNTER — Encounter: Payer: Self-pay | Admitting: Cardiovascular Disease

## 2019-08-14 ENCOUNTER — Ambulatory Visit: Payer: Medicare PPO | Admitting: Cardiovascular Disease

## 2019-08-14 ENCOUNTER — Other Ambulatory Visit: Payer: Self-pay

## 2019-08-14 VITALS — BP 124/64 | HR 57 | Ht 63.0 in | Wt 152.0 lb

## 2019-08-14 DIAGNOSIS — R7303 Prediabetes: Secondary | ICD-10-CM

## 2019-08-14 DIAGNOSIS — R5383 Other fatigue: Secondary | ICD-10-CM

## 2019-08-14 DIAGNOSIS — E782 Mixed hyperlipidemia: Secondary | ICD-10-CM | POA: Diagnosis not present

## 2019-08-14 DIAGNOSIS — R002 Palpitations: Secondary | ICD-10-CM | POA: Diagnosis not present

## 2019-08-14 DIAGNOSIS — I480 Paroxysmal atrial fibrillation: Secondary | ICD-10-CM | POA: Diagnosis not present

## 2019-08-14 DIAGNOSIS — I10 Essential (primary) hypertension: Secondary | ICD-10-CM

## 2019-08-14 NOTE — Patient Instructions (Signed)

## 2019-10-17 ENCOUNTER — Ambulatory Visit: Payer: Medicare PPO

## 2019-10-24 ENCOUNTER — Other Ambulatory Visit: Payer: Self-pay

## 2019-10-24 ENCOUNTER — Ambulatory Visit (INDEPENDENT_AMBULATORY_CARE_PROVIDER_SITE_OTHER): Payer: Medicare PPO

## 2019-10-24 DIAGNOSIS — Z23 Encounter for immunization: Secondary | ICD-10-CM

## 2020-01-05 ENCOUNTER — Other Ambulatory Visit: Payer: Self-pay | Admitting: Cardiovascular Disease

## 2020-01-05 DIAGNOSIS — I4891 Unspecified atrial fibrillation: Secondary | ICD-10-CM

## 2020-01-08 ENCOUNTER — Other Ambulatory Visit: Payer: Self-pay | Admitting: Family Medicine

## 2020-01-08 DIAGNOSIS — F411 Generalized anxiety disorder: Secondary | ICD-10-CM

## 2020-01-08 DIAGNOSIS — F339 Major depressive disorder, recurrent, unspecified: Secondary | ICD-10-CM

## 2020-01-12 ENCOUNTER — Other Ambulatory Visit: Payer: Self-pay | Admitting: Cardiovascular Disease

## 2020-01-12 NOTE — Telephone Encounter (Signed)
Eliquis 5mg  refill request received, pt is 86yrs old, weight-68.9kg, Crea-0.80 on 06/22/2019, Diagnosis-Afib, and last seen by Dr. 06/24/2019 on 08/14/2019. Dose is appropriate based on dosing criteria. Will send in refill to requested pharmacy.

## 2020-01-12 NOTE — Telephone Encounter (Signed)
Refill Request.  

## 2020-02-12 ENCOUNTER — Ambulatory Visit: Payer: Medicare PPO | Admitting: Urology

## 2020-02-14 ENCOUNTER — Ambulatory Visit: Payer: Self-pay | Admitting: Urology

## 2020-02-27 ENCOUNTER — Ambulatory Visit: Payer: Medicare PPO | Admitting: Family Medicine

## 2020-02-27 ENCOUNTER — Encounter: Payer: Self-pay | Admitting: Family Medicine

## 2020-02-27 ENCOUNTER — Other Ambulatory Visit: Payer: Self-pay

## 2020-02-27 VITALS — BP 110/80 | HR 52 | Temp 97.5°F | Resp 16 | Ht 63.0 in | Wt 152.0 lb

## 2020-02-27 DIAGNOSIS — R5382 Chronic fatigue, unspecified: Secondary | ICD-10-CM

## 2020-02-27 DIAGNOSIS — E032 Hypothyroidism due to medicaments and other exogenous substances: Secondary | ICD-10-CM | POA: Diagnosis not present

## 2020-02-27 DIAGNOSIS — M8949 Other hypertrophic osteoarthropathy, multiple sites: Secondary | ICD-10-CM | POA: Diagnosis not present

## 2020-02-27 DIAGNOSIS — F339 Major depressive disorder, recurrent, unspecified: Secondary | ICD-10-CM

## 2020-02-27 DIAGNOSIS — B372 Candidiasis of skin and nail: Secondary | ICD-10-CM | POA: Diagnosis not present

## 2020-02-27 DIAGNOSIS — M159 Polyosteoarthritis, unspecified: Secondary | ICD-10-CM

## 2020-02-27 DIAGNOSIS — M7021 Olecranon bursitis, right elbow: Secondary | ICD-10-CM | POA: Diagnosis not present

## 2020-02-27 DIAGNOSIS — M797 Fibromyalgia: Secondary | ICD-10-CM

## 2020-02-27 DIAGNOSIS — R7303 Prediabetes: Secondary | ICD-10-CM | POA: Diagnosis not present

## 2020-02-27 DIAGNOSIS — I10 Essential (primary) hypertension: Secondary | ICD-10-CM

## 2020-02-27 LAB — POCT GLYCOSYLATED HEMOGLOBIN (HGB A1C): Hemoglobin A1C: 5.7 % — AB (ref 4.0–5.6)

## 2020-02-27 MED ORDER — NYSTATIN 100000 UNIT/GM EX POWD: 1.0000 "application " | Freq: Three times a day (TID) | CUTANEOUS | 1 refills | Status: DC | PRN

## 2020-02-27 MED ORDER — DICLOFENAC SODIUM 1 % EX GEL: 2.0000 g | Freq: Three times a day (TID) | CUTANEOUS | 2 refills | Status: DC | PRN

## 2020-02-27 NOTE — Assessment & Plan Note (Signed)
Well-controlled Pre-DM with A1c 5.7  Plan:  1. Not on any therapy currently  2. Encourage improved lifestyle - low carb, low sugar diet, reduce portion size, continue improving regular exercise as tolerated

## 2020-02-27 NOTE — Progress Notes (Signed)
Subjective:    Patient ID: Stacey Duncan, female    DOB: 1931/10/24, 86 y.o.   MRN: [id number]  Stacey Duncan is a 86 y.o. female presenting on 02/27/2020 for Hypertension (6 month follow up) and Diarrhea (as per patient having loose stool)   HPI   Loose Stools Onset 4 months now, reports sometimes can go to bathroom up to 3 x a day, then next day may not have a BM. Seems to always be a loose stool. Not liquid or watery. Occasionally can be darker. No blood in the stool. Occasional abdominal discomfort or cramping but not always consistent. Admits some fecal urgency at times and has had rare accident. Last colonoscopy in age 46s, was reported to be normal. Has not had one since. - Son is being evaluated by GI for diarrhea as well and has upcoming stool studies - She did go last go to GI [place] 2018 saw Dr Servando Snare for GERD. She is not ready to return yet. - Denies significant abdominal pain or other concerns.  Hypothyroidism Last lab TSH 0.89 (07/2019). Taking Levothyroxine daily. No new concerns.  Pre-Diabetes Lab result A1c 5.7, previous 5.6 to 5.7 CBG not checking Meds:none Currently on ARB Diet: improved diet. Low carb. Exercise: limited activity due to pain and fibromyalgia, increased tiredness Denies hypoglycemia, urinary frequency, polyuria  Fibromyalgia / Osteoarthritis Followed by Pain Management / Ortho. Chronic pain symptoms multiple areas of body, including low back Dr Earl Lagos. She is on Gabapentin 100mg  BID, she was advised she could increase dose but she has not done this yet. Admits R Elbow Swelling / Hand Pain - describes fluid in R elbow, says it bothers her at times, she does a lot of activity with elbows flexed - hand joints tender, uses OTC cream occasionally  Intertrigo / Rash Left breast, below with red irritated rash. Thought it was wire in bra, now not wearing that type of bra still has red irritated rash. Using baby powder.  Major  Depression, chronic recurrent Chronic problem. Several life stressors with family and current COVID19 situation. Otherwise she is doing well currently on medicine. Prozac 40mg  daily works for her. She is not interested in switching even though she thinks it can cause indigestion. Previously failed Sertraline. Affected by her son, age 59s who lives at home, has caused her emotional distress Worsening issue with sleeping nightly, describes two areas on inner aspect of her knees, causing her pain has to sleep with pillow at night. Also spot on Left elbow that is painful. Primary concern is reduced activity- difficult to determine if this is combination of mental fatigue and mood, or if it is physical. She sees Cardiology and no other problem identified   Depression screen [facility] 2/9 02/27/2020 08/09/2019 08/08/2019  Decreased Interest 0 0 0  Down, Depressed, Hopeless 1 0 1  PHQ - 2 Score 1 0 1  Altered sleeping 0 0 -  Tired, decreased energy 2 3 -  Change in appetite 0 0 -  Feeling bad or failure about yourself  0 0 -  Trouble concentrating 0 0 -  Moving slowly or fidgety/restless 1 0 -  Suicidal thoughts 0 0 -  PHQ-9 Score 4 3 -  Difficult doing work/chores Not difficult at all Not difficult at all -  Some recent data might be hidden   GAD 7 : Generalized Anxiety Score 02/27/2020 11/09/2017 10/20/2017  Nervous, Anxious, on Edge 1 1 3   Control/stop worrying 0 3 3  Worry too much - different things 1 2 0  Trouble relaxing 1 0 1  Restless 0 0 3  Easily annoyed or irritable 1 3 2   Afraid - awful might happen 1 1 3   Total GAD 7 Score 5 10 15   Anxiety Difficulty Not difficult at all Somewhat difficult Very difficult     Social History   Tobacco Use  . Smoking status: Never Smoker  . Smokeless tobacco: Never Used  Substance Use Topics  . Alcohol use: Yes    Alcohol/week: 7.0 standard drinks    Types: 7 Glasses of wine per week    Comment: glass of wine daily  . Drug use: No     Review of Systems Per HPI unless specifically indicated above     Objective:    BP 110/80   Pulse (!) 52   Temp (!) 97.5 F (36.4 C) (Temporal)   Resp 16   Ht 5\' 3"  (1.6 m)   Wt 152 lb (68.9 kg)   BMI 26.93 kg/m   Wt Readings from Last 3 Encounters:  02/27/20 152 lb (68.9 kg)  08/14/19 152 lb (68.9 kg)  08/09/19 151 lb (68.5 kg)    Physical Exam Vitals and nursing note reviewed.  Constitutional:      General: She is not in acute distress.    Appearance: She is well-developed. She is not diaphoretic.     Comments: Well-appearing, comfortable, cooperative  HENT:     Head: Normocephalic and atraumatic.  Eyes:     General:        Right eye: No discharge.        Left eye: No discharge.     Conjunctiva/sclera: Conjunctivae normal.  Cardiovascular:     Rate and Rhythm: Normal rate.  Pulmonary:     Effort: Pulmonary effort is normal.  Musculoskeletal:     Comments: Bilateral hands with DIP deformity bulkiness various places. No erythema.  R elbow soft fluctuant olecranon bursa fluid filled, no erythema, no warmth, non tender.  Skin:    General: Skin is warm and dry.     Findings: No erythema or rash.  Neurological:     Mental Status: She is alert and oriented to person, place, and time.  Psychiatric:        Behavior: Behavior normal.     Comments: Well groomed, good eye contact, normal speech and thoughts    Results for orders placed or performed in visit on 02/27/20  POCT HgB A1C  Result Value Ref Range   Hemoglobin A1C 5.7 (A) 4.0 - 5.6 %      Assessment & Plan:   Problem List Items Addressed This Visit    Pre-diabetes - Primary    Well-controlled Pre-DM with A1c 5.7  Plan:  1. Not on any therapy currently  2. Encourage improved lifestyle - low carb, low sugar diet, reduce portion size, continue improving regular exercise as tolerated      Relevant Orders   POCT HgB A1C (Completed)   Osteoarthritis    Followed by [facility] / Pain Multiple  joints, knees, hands, elbow - New problem olecranon bursitis likely from underlying OA/DJD - Return to ortho as planned - also can be fibromyalgia underlying  - Increase Gabapentin from 100mg  BID up to TID, and + 1 additional dose in PM to help sleep and joint pain - Also add topical Diclofenac gel new rx sent for hands and elbow, olecranon bursitis      Relevant Medications  diclofenac Sodium (QC DICLOFENAC SODIUM) 1 % GEL   Major depression, recurrent, chronic (HCC)    Stable chronic mood, on SSRI Improved on Prozac Failed Sertraline Supsected secondary insomnia component causing tired/fatigue  Plan Continue Fluoxetine Prozac 40mg  daily, discussed option to switch in future, may have GI side effect      Hypothyroidism    Controlled last TSH Continue Levothyroxine 166mcg daily      Fibromyalgia   Essential hypertension    Well-controlled HTN. History of low BP in past - Home BP readings normal  Paroxysmal AFib    Plan:  1. Continue current BP regimen Metoprolol 12.5mg  DAILY 2. Encourage improved lifestyle - low sodium diet, regular exercise 3. Continue monitor BP outside office, bring readings to next visit, if persistently >140/90 or new symptoms notify office sooner      Chronic fatigue    See HPI and prior notes for background Chronic problem still Seems multifactorial - mood, energy, motivation, joint pain fibromyalgia, she has PAF but no other clear cause of weakness identified. - Will treat pain better now with inc gabapentin, topical diclofenac for now       Other Visit Diagnoses    Candidal intertrigo       Relevant Medications   nystatin (MYCOSTATIN/NYSTOP) powder   Olecranon bursitis of right elbow       Relevant Medications   diclofenac Sodium (QC DICLOFENAC SODIUM) 1 % GEL      Acute flare x 1-2 weeks, left under breast skin folds, mild to moderate candidal intertrigo based on history, declined exam today  Plan: 1. Start Nystatin powder TID for  2-4 weeks 2. May add topical steroid if need in future 3. Keep dry, open to air some, limit bra, avoid excessive sweating   Meds ordered this encounter  Medications  . nystatin (MYCOSTATIN/NYSTOP) powder    Sig: Apply 1 application topically 3 (three) times daily as needed. For 1-2 weeks or longer if needed    Dispense:  30 g    Refill:  1  . diclofenac Sodium (QC DICLOFENAC SODIUM) 1 % GEL    Sig: Apply 2 g topically 3 (three) times daily as needed. For arthritis of hands, elbow    Dispense:  100 g    Refill:  2    Follow up plan: Return in about 6 months (around 08/29/2020) for Annual Physical.   Nobie Putnam, DO [facility] 02/27/2020, 11:45 AM

## 2020-02-27 NOTE — Assessment & Plan Note (Signed)
Followed by [facility] / Pain Multiple joints, knees, hands, elbow - New problem olecranon bursitis likely from underlying OA/DJD - Return to ortho as planned - also can be fibromyalgia underlying  - Increase Gabapentin from 100mg  BID up to TID, and + 1 additional dose in PM to help sleep and joint pain - Also add topical Diclofenac gel new rx sent for hands and elbow, olecranon bursitis

## 2020-02-27 NOTE — Patient Instructions (Addendum)
Thank you for coming to the office today.  Recommend to increase Gabapentin 100mg  up to 1 pill 3 times a day, and also MAY take an extra at bedtime to help with leg pain and help you sleep. It is safe to take this much, if need more please ask Dr .  Ordered Diclofenac topical gel to use on fingers/hands / elbow as needed up to 3 times a day for pain relief from arthritis.  For skin irritation and discomfort under left breast please use topical Nystatin powder anti fungal 3 times a day up to 1-2 weeks and repeat if need.  Recent Labs    08/09/19 0000 02/27/20 1149  HGBA1C 5.7 5.7*   If loose stools continue, we can refer to GI again for consultation.  Try to avoid artificial sweetener, hard candy, caffeine, gum. These can cause it.  OTC Peppermint Oil (Triple Coated Capsule) 180mg  take one 3 times daily to reduce diarrhea   Please schedule a Follow-up Appointment to: Return in about 6 months (around 08/29/2020) for Annual Physical.  If you have any other questions or concerns, please feel free to call the office or send a message through MyChart. You may also schedule an earlier appointment if necessary.  Additionally, you may be receiving a survey about your experience at our office within a few days to 1 week by e-mail or mail. We value your feedback.  , DO [facility], Saralyn Pilar

## 2020-02-27 NOTE — Assessment & Plan Note (Signed)
Stable chronic mood, on SSRI Improved on Prozac Failed Sertraline Supsected secondary insomnia component causing tired/fatigue  Plan Continue Fluoxetine Prozac 40mg  daily, discussed option to switch in future, may have GI side effect

## 2020-02-27 NOTE — Assessment & Plan Note (Signed)
Controlled last TSH Continue Levothyroxine daily

## 2020-02-27 NOTE — Assessment & Plan Note (Signed)
Well-controlled HTN. History of low BP in past - Home BP readings normal  Paroxysmal AFib    Plan:  1. Continue current BP regimen Metoprolol 12.5mg DAILY 2. Encourage improved lifestyle - low sodium diet, regular exercise 3. Continue monitor BP outside office, bring readings to next visit, if persistently >140/90 or new symptoms notify office sooner 

## 2020-02-27 NOTE — Assessment & Plan Note (Signed)
See HPI and prior notes for background Chronic problem still Seems multifactorial - mood, energy, motivation, joint pain fibromyalgia, she has PAF but no other clear cause of weakness identified. - Will treat pain better now with inc gabapentin, topical diclofenac for now

## 2020-02-28 ENCOUNTER — Other Ambulatory Visit: Payer: Self-pay | Admitting: Family Medicine

## 2020-02-28 DIAGNOSIS — B372 Candidiasis of skin and nail: Secondary | ICD-10-CM

## 2020-02-28 MED ORDER — KETOCONAZOLE 2 % EX CREA: 1.0000 "application " | TOPICAL_CREAM | Freq: Every day | CUTANEOUS | 1 refills | Status: DC

## 2020-02-29 ENCOUNTER — Telehealth: Payer: Self-pay | Admitting: Family Medicine

## 2020-02-29 DIAGNOSIS — B372 Candidiasis of skin and nail: Secondary | ICD-10-CM

## 2020-02-29 MED ORDER — NYSTATIN 100000 UNIT/GM EX POWD: 1.0000 "application " | Freq: Three times a day (TID) | CUTANEOUS | 0 refills | Status: DC | PRN

## 2020-02-29 NOTE — Telephone Encounter (Signed)
She was not made aware that the powder was changed to cream due to insurance coverage.  DC Ketoconazole  I called her now, she request powder to [facility], I will try that one, perhaps it can be covered from them.  Otherwise, she can use Goodrx and [facility] powder [facility]  Saralyn Pilar, DO [facility] 02/29/2020, 10:24 AM

## 2020-02-29 NOTE — Telephone Encounter (Signed)
Patient informed as per Dr.K she was saying pharmacy was not nice to her and told her that Rx was never send from [place]. For future she don't mind using cream she thought if Dr.K has Rx powder then she should use powder but for right now wants to go with Dr. Kirtland Bouchard advise.

## 2020-02-29 NOTE — Telephone Encounter (Signed)
Pt.called said this mediation was not called in nystatin (MYCOSTATIN/NYSTOP) powder. She is requesting this medication to be called into [facility] mail order

## 2020-03-04 ENCOUNTER — Other Ambulatory Visit: Payer: Self-pay

## 2020-03-04 MED ORDER — APIXABAN 5 MG PO TABS: ORAL_TABLET | ORAL | 1 refills | Status: DC

## 2020-04-04 ENCOUNTER — Telehealth: Payer: Self-pay | Admitting: Family Medicine

## 2020-04-04 DIAGNOSIS — E039 Hypothyroidism, unspecified: Secondary | ICD-10-CM

## 2020-04-10 NOTE — Telephone Encounter (Signed)
error 

## 2020-04-23 MED ORDER — LEVOTHYROXINE SODIUM 100 MCG PO TABS: 100.0000 ug | ORAL_TABLET | Freq: Every day | ORAL | 3 refills | Status: DC

## 2020-04-23 NOTE — Addendum Note (Signed)
Addended by: Smitty Cords on: 04/23/2020 05:36 PM   Modules accepted: Orders

## 2020-04-23 NOTE — Telephone Encounter (Signed)
Medication Refill - Medication: levothyroxine (SYNTHROID) 100 MCG tablet   Has the patient contacted their pharmacy? Yes.   (Agent: If no, request that the patient contact the pharmacy for the refill.) (Agent: If yes, when and what did the pharmacy advise?)  Preferred Pharmacy (with phone number or street name): [facility] - [place], [place] - [address]  [address] [place] [address]  Phone:  [phone] Fax:  [phone]  Agent: Please be advised that RX refills may take up to 3 business days. We ask that you follow-up with your pharmacy.

## 2020-04-26 ENCOUNTER — Other Ambulatory Visit: Payer: Self-pay

## 2020-04-26 MED ORDER — ATORVASTATIN CALCIUM 80 MG PO TABS: 80.0000 mg | ORAL_TABLET | Freq: Every day | ORAL | 2 refills | Status: DC

## 2020-05-23 ENCOUNTER — Other Ambulatory Visit: Payer: Self-pay

## 2020-05-23 MED ORDER — HYDRALAZINE HCL 25 MG PO TABS: 25.0000 mg | ORAL_TABLET | Freq: Two times a day (BID) | ORAL | 0 refills | Status: DC

## 2020-05-29 ENCOUNTER — Other Ambulatory Visit: Payer: Self-pay | Admitting: Family Medicine

## 2020-05-29 DIAGNOSIS — F339 Major depressive disorder, recurrent, unspecified: Secondary | ICD-10-CM

## 2020-05-29 DIAGNOSIS — F411 Generalized anxiety disorder: Secondary | ICD-10-CM

## 2020-06-06 DIAGNOSIS — D485 Neoplasm of uncertain behavior of skin: Secondary | ICD-10-CM | POA: Diagnosis not present

## 2020-06-06 DIAGNOSIS — L570 Actinic keratosis: Secondary | ICD-10-CM | POA: Diagnosis not present

## 2020-06-06 DIAGNOSIS — B372 Candidiasis of skin and nail: Secondary | ICD-10-CM | POA: Diagnosis not present

## 2020-06-06 DIAGNOSIS — L821 Other seborrheic keratosis: Secondary | ICD-10-CM | POA: Diagnosis not present

## 2020-06-06 DIAGNOSIS — C44311 Basal cell carcinoma of skin of nose: Secondary | ICD-10-CM | POA: Diagnosis not present

## 2020-06-12 ENCOUNTER — Other Ambulatory Visit: Payer: Self-pay | Admitting: Cardiovascular Disease

## 2020-06-12 ENCOUNTER — Ambulatory Visit: Payer: Self-pay | Admitting: *Deleted

## 2020-06-12 DIAGNOSIS — I4891 Unspecified atrial fibrillation: Secondary | ICD-10-CM

## 2020-06-12 NOTE — Telephone Encounter (Signed)
Called patient back. She is taking Fluconazole daily for 1 week due to fungal infection of skin. She asks about package insert about may cause rapid heart rate, she has issues with Atrial Fibrillation. I advised her upon further review, rarely can cause prolong QTc in post marketing study however, it should not directly cause AFib. If it causes mild tachycardia that is possible but again very unlikely to have cardiovascular side effects on the rx package insert data that I have reviewed.  She will take it for 1 week as prescribed by Dermatologist.   Follow up as needed  Saralyn Pilar, DO [facility] 06/12/2020, 3:01 PM

## 2020-06-12 NOTE — Telephone Encounter (Signed)
Pt was prescribed fluconazole by a dermatologist in [place], Dr Cheree Ditto. Pt states after reading the label warnings, it states can cause rapid heart beat. Pt states she has AFIB. Pt wants to hear from her dr if this is safe. She states she is not going to take anything that can cause afib, she can do that on her own if she wants!!  She declined to call the dermatologist that prescribed, she wants to hear it from her dr if ok to take.   Reason for Disposition . [1] Caller has URGENT medication question about med that PCP or specialist prescribed AND [2] triager unable to answer question  Answer Assessment - Initial Assessment Questions 1.   NAME of MEDICATION: "What medicine are you calling about?"     Fluconazole-- short term- 7 days 2.   QUESTION: "What is your question?"     Patient is nervious about SE- Afib 3.   PRESCRIBING HCP: "Who prescribed it?" Reason: if prescribed by specialist, call should be referred to that group.     Dr Cheree Ditto 4. SYMPTOMS: "Do you have any symptoms?"     fungal infection under L breast  Protocols used: MEDICATION QUESTION CALL-A-AH

## 2020-07-03 DIAGNOSIS — C44311 Basal cell carcinoma of skin of nose: Secondary | ICD-10-CM | POA: Diagnosis not present

## 2020-07-03 DIAGNOSIS — C4491 Basal cell carcinoma of skin, unspecified: Secondary | ICD-10-CM | POA: Diagnosis not present

## 2020-07-16 ENCOUNTER — Telehealth: Payer: Self-pay | Admitting: Family Medicine

## 2020-07-16 DIAGNOSIS — M159 Polyosteoarthritis, unspecified: Secondary | ICD-10-CM

## 2020-07-16 DIAGNOSIS — M797 Fibromyalgia: Secondary | ICD-10-CM

## 2020-07-16 MED ORDER — GABAPENTIN 300 MG PO CAPS: ORAL_CAPSULE | ORAL | 1 refills | Status: DC

## 2020-07-16 NOTE — Telephone Encounter (Signed)
Called patient.  Used external med rec, because her Gabapentin dose on our chart was only 100mg .  She was on Gabapentin 300mg  capsules, taking 1 AM, 1 afternoon and 2 PM. [facility]  Sent rx 90 day  , DO [facility] 07/16/2020, 5:05 PM

## 2020-07-16 NOTE — Telephone Encounter (Signed)
Medication Refill - Medication: Gabapentn 300mg    Has the patient contacted their pharmacy? Yes.   (Agent: If no, request that the patient contact the pharmacy for the refill.) (Agent: If yes, when and what did the pharmacy advise?)  Preferred Pharmacy (with phone number or street name): [id number] [place], [place] - [address]  Agent: Please be advised that RX refills may take up to 3 business days. We ask that you follow-up with your pharmacy.

## 2020-07-18 DIAGNOSIS — L718 Other rosacea: Secondary | ICD-10-CM | POA: Diagnosis not present

## 2020-08-30 ENCOUNTER — Telehealth: Payer: Self-pay | Admitting: Family Medicine

## 2020-08-30 NOTE — Telephone Encounter (Signed)
Patient stopped by wanted to collect Urine specimen for UTI without being seen ---advised her appt needed for better evaluation and treatment --she scheduled for Tuesday and advised to go to urgent care -- if it gets worst due to long weekend.

## 2020-09-03 ENCOUNTER — Ambulatory Visit: Payer: Medicare PPO | Admitting: Family Medicine

## 2020-09-04 ENCOUNTER — Other Ambulatory Visit: Payer: Self-pay

## 2020-09-04 ENCOUNTER — Encounter: Payer: Self-pay | Admitting: Family Medicine

## 2020-09-04 ENCOUNTER — Ambulatory Visit: Payer: Medicare PPO | Admitting: Family Medicine

## 2020-09-04 VITALS — BP 134/84 | HR 57 | Temp 97.1°F | Ht 63.0 in | Wt 150.0 lb

## 2020-09-04 DIAGNOSIS — N3001 Acute cystitis with hematuria: Secondary | ICD-10-CM

## 2020-09-04 DIAGNOSIS — N390 Urinary tract infection, site not specified: Secondary | ICD-10-CM | POA: Diagnosis not present

## 2020-09-04 LAB — POCT URINALYSIS DIPSTICK
Bilirubin, UA: NEGATIVE
Glucose, UA: NEGATIVE
Ketones, UA: NEGATIVE
Nitrite, UA: POSITIVE
Protein, UA: POSITIVE — AB
Spec Grav, UA: 1.01 (ref 1.010–1.025)
Urobilinogen, UA: 0.2 E.U./dL
pH, UA: 5 (ref 5.0–8.0)

## 2020-09-04 MED ORDER — CIPROFLOXACIN HCL 250 MG PO TABS: 250.0000 mg | ORAL_TABLET | Freq: Two times a day (BID) | ORAL | 0 refills | Status: DC

## 2020-09-04 NOTE — Patient Instructions (Addendum)
Thank you for coming to the office today.  1. You have a Urinary Tract Infection - this is very common, your symptoms are reassuring and you should get better within 1 week on the antibiotics - Start Cipro 250mg  2 times daily for next 5 days, complete entire course, even if feeling better - We sent urine for a culture, we will call you within next few days if we need to change antibiotics - Please drink plenty of fluids, improve hydration over next 1 week  If symptoms worsening, developing nausea / vomiting, worsening back pain, fevers / chills / sweats, then please return for re-evaluation sooner.  To prevent bladder and kidney infections...  Drink enough water to keep your pee clear to pale yellow  If you think you are getting another bladder infection, start drinking cranberry juice and come see so we can check the urine.  Please schedule a Follow-up Appointment to: Return if symptoms worsen or fail to improve.  If you have any other questions or concerns, please feel free to call the office or send a message through MyChart. You may also schedule an earlier appointment if necessary.  Additionally, you may be receiving a survey about your experience at our office within a few days to 1 week by e-mail or mail. We value your feedback.  [place], DO [facility], [facility]

## 2020-09-04 NOTE — Progress Notes (Signed)
Subjective:    Patient ID: Stacey Duncan, female    DOB: [birth year], 86 y.o.   MRN: [id number]  Stacey Duncan is a 86 y.o. female presenting on 09/04/2020 for Urinary Tract Infection (onset 3 weeks)   HPI   UTI Reports symptoms with increased urinary frequency and occasional urgency, admits some discomfort and some possible pain. But not having significant pain. She said symptoms were present then improved then returned and keeps coming back. Not quite her typical UTI. - Last documented UTI with urine culture was 06/2019 GBS see prior record, failed keflex resolved w/ cipro course Denies fever chills sweats nausea vomiting abdominal pain or back pain, confusion, dizziness   Depression screen [facility] 2/9 02/27/2020 08/09/2019 08/08/2019  Decreased Interest 0 0 0  Down, Depressed, Hopeless 1 0 1  PHQ - 2 Score 1 0 1  Altered sleeping 0 0 -  Tired, decreased energy 2 3 -  Change in appetite 0 0 -  Feeling bad or failure about yourself  0 0 -  Trouble concentrating 0 0 -  Moving slowly or fidgety/restless 1 0 -  Suicidal thoughts 0 0 -  PHQ-9 Score 4 3 -  Difficult doing work/chores Not difficult at all Not difficult at all -  Some recent data might be hidden    Social History   Tobacco Use  . Smoking status: Never Smoker  . Smokeless tobacco: Never Used  Vaping Use  . Vaping Use: Never used  Substance Use Topics  . Alcohol use: Yes    Alcohol/week: 7.0 standard drinks    Types: 7 Glasses of wine per week    Comment: glass of wine daily  . Drug use: No    Review of Systems Per HPI unless specifically indicated above     Objective:    BP 134/84 (BP Location: Left Arm, Cuff Size: Normal)   Pulse (!) 57   Temp (!) 97.1 F (36.2 C) (Temporal)   Ht 5\' 3"  (1.6 m)   Wt 150 lb (68 kg)   SpO2 97%   BMI 26.57 kg/m   Wt Readings from Last 3 Encounters:  09/04/20 150 lb (68 kg)  02/27/20 152 lb (68.9 kg)  08/14/19 152 lb (68.9 kg)    Physical Exam Vitals and nursing  note reviewed.  Constitutional:      General: She is not in acute distress.    Appearance: She is well-developed. She is not diaphoretic.     Comments: Well-appearing, comfortable, cooperative  HENT:     Head: Normocephalic and atraumatic.  Eyes:     General:        Right eye: No discharge.        Left eye: No discharge.     Conjunctiva/sclera: Conjunctivae normal.  Cardiovascular:     Rate and Rhythm: Normal rate.  Pulmonary:     Effort: Pulmonary effort is normal.  Skin:    General: Skin is warm and dry.     Findings: No erythema or rash.  Neurological:     Mental Status: She is alert and oriented to person, place, and time.  Psychiatric:        Behavior: Behavior normal.     Comments: Well groomed, good eye contact, normal speech and thoughts    Results for orders placed or performed in visit on 09/04/20  POCT Urinalysis Dipstick  Result Value Ref Range   Color, UA dark amber    Clarity, UA cloudy    Glucose,  UA Negative Negative   Bilirubin, UA Negative    Ketones, UA Negative    Spec Grav, UA 1.010 1.010 - 1.025   Blood, UA moderate    pH, UA 5.0 5.0 - 8.0   Protein, UA Positive (A) Negative   Urobilinogen, UA 0.2 0.2 or 1.0 E.U./dL   Nitrite, UA positive    Leukocytes, UA Moderate (2+) (A) Negative   Appearance cloudy    Odor foul       Assessment & Plan:   Problem List Items Addressed This Visit    None    Visit Diagnoses    Acute cystitis with hematuria    -  Primary   Relevant Medications   ciprofloxacin (CIPRO) 250 MG tablet   Other Relevant Orders   POCT Urinalysis Dipstick (Completed)   Urine Culture      Clinically consistent with UTI and confirmed on UA. No recent UTIs or abx courses, last 1 year 06/2019, GBS infection pan sensitive resolved w/ Cipro, failed Keflex. No concern for pyelo today (no systemic symptoms, neg fever, back pain, n/v). Previously followed by Urology in 2020  Plan: 1. UA with leuks, blood 2. Ordered Urine  culture 3. Cipro 250mg  BID x 5 days 4. Improve PO hydration 5. RTC if no improvement 1-2 weeks, red flags given to return sooner  Meds ordered this encounter  Medications  . ciprofloxacin (CIPRO) 250 MG tablet    Sig: Take 1 tablet (250 mg total) by mouth 2 (two) times daily.    Dispense:  10 tablet    Refill:  0      Follow up plan: Return if symptoms worsen or fail to improve.   , DO [facility] 09/04/2020, 10:01 AM

## 2020-09-06 LAB — URINE CULTURE
MICRO NUMBER:: 10925844
SPECIMEN QUALITY:: ADEQUATE
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
## 2020-09-09 ENCOUNTER — Ambulatory Visit: Payer: Self-pay

## 2020-09-09 ENCOUNTER — Ambulatory Visit: Payer: Medicare PPO | Admitting: Cardiovascular Disease

## 2020-09-09 NOTE — Telephone Encounter (Signed)
Gentle man answered and requested we call patient at [phone]  Called patient at new number.  She wanted to ask about her urine test done 09/04/20.  I read note by Dr Althea Charon. Patient states that she is taking her CIPRO as instructed and will finish today.  She states her symptoms are very much improved. She was told to call back if she notices symptoms returning.  She verbalized understanding.  New number is her new cell number. It was added to chart per her request.  Answer Assessment - Initial Assessment Questions 1. REASON FOR CALL or QUESTION: "What is your reason for calling today?" or "How can I best help you?" or "What question do you have that I can help answer?"     How was my lab. I have not heard the result of my urine test.  Protocols used: INFORMATION ONLY CALL - NO TRIAGE-A-AH

## 2020-09-10 ENCOUNTER — Encounter: Payer: Self-pay | Admitting: Cardiovascular Disease

## 2020-09-24 ENCOUNTER — Ambulatory Visit: Payer: Medicare PPO | Admitting: Family Medicine

## 2020-09-24 ENCOUNTER — Encounter: Payer: Self-pay | Admitting: Family Medicine

## 2020-09-24 ENCOUNTER — Other Ambulatory Visit: Payer: Self-pay

## 2020-09-24 VITALS — BP 100/47 | HR 57 | Temp 97.1°F | Resp 16 | Ht 63.0 in | Wt 150.0 lb

## 2020-09-24 DIAGNOSIS — E782 Mixed hyperlipidemia: Secondary | ICD-10-CM | POA: Diagnosis not present

## 2020-09-24 DIAGNOSIS — R200 Anesthesia of skin: Secondary | ICD-10-CM

## 2020-09-24 DIAGNOSIS — Z23 Encounter for immunization: Secondary | ICD-10-CM

## 2020-09-24 DIAGNOSIS — E039 Hypothyroidism, unspecified: Secondary | ICD-10-CM

## 2020-09-24 DIAGNOSIS — M797 Fibromyalgia: Secondary | ICD-10-CM

## 2020-09-24 DIAGNOSIS — R202 Paresthesia of skin: Secondary | ICD-10-CM | POA: Diagnosis not present

## 2020-09-24 DIAGNOSIS — R5382 Chronic fatigue, unspecified: Secondary | ICD-10-CM

## 2020-09-24 DIAGNOSIS — E539 Vitamin B deficiency, unspecified: Secondary | ICD-10-CM | POA: Diagnosis not present

## 2020-09-24 DIAGNOSIS — R7303 Prediabetes: Secondary | ICD-10-CM | POA: Diagnosis not present

## 2020-09-24 NOTE — Patient Instructions (Addendum)
Thank you for coming to the office today.  Numbness may be from high arches, could be blood work we will check into this.   DUE for FASTING BLOOD WORK (no food or drink after midnight before the lab appointment, only water or coffee without cream/sugar on the morning of)  SCHEDULE "Lab Only" visit in the morning at the clinic for lab draw in  1 week  - Make sure Lab Only appointment is at about 1 week before your next appointment, so that results will be available  For Lab Results, once available within 2-3 days of blood draw, you can can log in to MyChart online to view your results and a brief explanation. Also, we can discuss results at next follow-up visit.   Please schedule a Follow-up Appointment to: Return in about 3 weeks (around 10/15/2020) for Follow-up 2-4 weeks for lab results, Mood/Depression PHQ.  If you have any other questions or concerns, please feel free to call the office or send a message through MyChart. You may also schedule an earlier appointment if necessary.  Additionally, you may be receiving a survey about your experience at our office within a few days to 1 week by e-mail or mail. We value your feedback.  Saralyn Pilar, DO [facility], [place]

## 2020-09-24 NOTE — Progress Notes (Signed)
Subjective:    Patient ID: Stacey Duncan, female    DOB: [birth year], 86 y.o.   MRN: [id number]  Stacey Duncan is a 86 y.o. female presenting on 09/24/2020 for Hypothyroidism (fatigue, exhaustion, numbness in feets onset month)  Last labs done about 1 year ago June/Aug 2020  HPI   Hypothyroidism Last lab TSH 0.89 (07/2019). Taking Levothyroxine daily. Admits worsening fatigue and tired symptoms She attributes some to thyroid and other causes, she admits depression, see below.  Pre-Diabetes  Lab result A1c 5.7, previous 5.6 to 5.7 She is due for repeat A1c, will get lab CBG not checking Meds:none Currently on ARB Diet: improveddiet. Low carb. Exercise: limited activity due to pain and fibromyalgia, increasedtiredness Denies hypoglycemia, urinary frequency, polyuria  Fibromyalgia/ Osteoarthritis Chronic problem. No new concerns today  Bilateral Foot Numbness onset 2 months, started having numbness tingling in both feet, on bottoms and has increased, and some affecting her ankles at times, worse if lay down to rest, not having RLS symptoms. Not having any pain or burning and no swelling. - No changes in activity - Not impacting her, she just wants to know more - Has high arches  Major Depression, chronic recurrent moderate Worsened mood, 2 close deaths in family, her sister in law, unexpectedly, and her sister's husband - she is very anxious and worrying about her husband, he may have congestive heart failure - She is currently still taking Fluoxetine 40mg  daily - she states she has good support system, but needs help, she has talked to hospice psychologist in past with bereavement, she will start there, declines other referral to therapist today   Health Maintenance:  Due for Flu Shot, will receive today    Depression screen [facility] 2/9 09/24/2020 02/27/2020 08/09/2019  Decreased Interest 2 0 0  Down, Depressed, Hopeless 1 1 0  PHQ - 2 Score 3 1 0   Altered sleeping 1 0 0  Tired, decreased energy 2 2 3   Change in appetite 3 0 0  Feeling bad or failure about yourself  0 0 0  Trouble concentrating 0 0 0  Moving slowly or fidgety/restless 0 1 0  Suicidal thoughts 0 0 0  PHQ-9 Score 9 4 3   Difficult doing work/chores Somewhat difficult Not difficult at all Not difficult at all  Some recent data might be hidden   GAD 7 : Generalized Anxiety Score 09/24/2020 02/27/2020 11/09/2017 10/20/2017  Nervous, Anxious, on Edge 2 1 1 3   Control/stop worrying 2 0 3 3  Worry too much - different things 2 1 2  0  Trouble relaxing 1 1 0 1  Restless 0 0 0 3  Easily annoyed or irritable 2 1 3 2   Afraid - awful might happen 2 1 1 3   Total GAD 7 Score 11 5 10 15   Anxiety Difficulty Somewhat difficult Not difficult at all Somewhat difficult Very difficult     Social History   Tobacco Use  . Smoking status: Never Smoker  . Smokeless tobacco: Never Used  Vaping Use  . Vaping Use: Never used  Substance Use Topics  . Alcohol use: Yes    Alcohol/week: 7.0 standard drinks    Types: 7 Glasses of wine per week    Comment: glass of wine daily  . Drug use: No    Review of Systems Per HPI unless specifically indicated above     Objective:    BP (!) 100/47   Pulse (!) 57   Temp (!) 97.1  F (36.2 C) (Temporal)   Resp 16   Ht 5\' 3"  (1.6 m)   Wt 150 lb (68 kg)   SpO2 96%   BMI 26.57 kg/m   Wt Readings from Last 3 Encounters:  09/24/20 150 lb (68 kg)  09/04/20 150 lb (68 kg)  02/27/20 152 lb (68.9 kg)    Physical Exam Vitals and nursing note reviewed.  Constitutional:      General: She is not in acute distress.    Appearance: She is well-developed. She is not diaphoretic.     Comments: Well-appearing, comfortable, cooperative  HENT:     Head: Normocephalic and atraumatic.  Eyes:     General:        Right eye: No discharge.        Left eye: No discharge.     Conjunctiva/sclera: Conjunctivae normal.  Cardiovascular:     Rate and  Rhythm: Normal rate.  Pulmonary:     Effort: Pulmonary effort is normal.  Musculoskeletal:     Right lower leg: No edema.     Left lower leg: No edema.  Skin:    General: Skin is warm and dry.     Findings: No erythema or rash.  Neurological:     Mental Status: She is alert and oriented to person, place, and time.     Sensory: Sensory deficit (Bilateral feet plantar aspect without deformity but has some mild reduced monofilament sensation forefoot bilateral fairly symmetrical, no lesions or callus, has pes cavus high arches) present.  Psychiatric:        Behavior: Behavior normal.     Comments: Well groomed, good eye contact, normal speech and thoughts       Results for orders placed or performed in visit on 09/04/20  Urine Culture   Specimen: Urine  Result Value Ref Range   MICRO NUMBER: 11/04/20    SPECIMEN QUALITY: Adequate    Sample Source URINE    STATUS: FINAL    ISOLATE 1: Escherichia coli (A)       Susceptibility   Escherichia coli - URINE CULTURE, REFLEX    AMOX/CLAVULANIC 4 Sensitive     AMPICILLIN 4 Sensitive     AMPICILLIN/SULBACTAM <=2 Sensitive     CEFAZOLIN* <=4 Not Reportable      * For infections other than uncomplicated UTIcaused by E. coli, K. pneumoniae or P. mirabilis:Cefazolin is resistant if MIC > or = 8 mcg/mL.(Distinguishing susceptible versus intermediatefor isolates with MIC < or = 4 mcg/mL requiresadditional testing.)For uncomplicated UTI caused by E. coli,K. pneumoniae or P. mirabilis: Cefazolin issusceptible if MIC <32 mcg/mL and predictssusceptible to the oral agents cefaclor, cefdinir,cefpodoxime, cefprozil, cefuroxime, cephalexinand loracarbef.    CEFEPIME <=1 Sensitive     CEFTRIAXONE <=1 Sensitive     CIPROFLOXACIN <=0.25 Sensitive     LEVOFLOXACIN <=0.12 Sensitive     ERTAPENEM <=0.5 Sensitive     GENTAMICIN <=1 Sensitive     IMIPENEM <=0.25 Sensitive     NITROFURANTOIN <=16 Sensitive     PIP/TAZO <=4 Sensitive     TOBRAMYCIN <=1  Sensitive     TRIMETH/SULFA* <=20 Sensitive      * For infections other than uncomplicated UTIcaused by E. coli, K. pneumoniae or P. mirabilis:Cefazolin is resistant if MIC > or = 8 mcg/mL.(Distinguishing susceptible versus intermediatefor isolates with MIC < or = 4 mcg/mL requiresadditional testing.)For uncomplicated UTI caused by E. coli,K. pneumoniae or P. mirabilis: Cefazolin issusceptible if MIC <32 mcg/mL and predictssusceptible to the oral agents cefaclor,  cefdinir,cefpodoxime, cefprozil, cefuroxime, cephalexinand loracarbef.Legend:S = Susceptible  I = IntermediateR = Resistant  NS = Not susceptible* = Not tested  NR = Not reported**NN = See antimicrobic comments  POCT Urinalysis Dipstick  Result Value Ref Range   Color, UA dark amber    Clarity, UA cloudy    Glucose, UA Negative Negative   Bilirubin, UA Negative    Ketones, UA Negative    Spec Grav, UA 1.010 1.010 - 1.025   Blood, UA moderate    pH, UA 5.0 5.0 - 8.0   Protein, UA Positive (A) Negative   Urobilinogen, UA 0.2 0.2 or 1.0 E.U./dL   Nitrite, UA positive    Leukocytes, UA Moderate (2+) (A) Negative   Appearance cloudy    Odor foul       Assessment & Plan:   Problem List Items Addressed This Visit    Pre-diabetes   Relevant Orders   Hemoglobin A1c   Hypothyroidism - Primary   Relevant Orders   TSH   T4, free   COMPLETE METABOLIC PANEL WITH GFR   Hyperlipidemia   Relevant Orders   Lipid panel   Fibromyalgia   Chronic fatigue   Relevant Orders   COMPLETE METABOLIC PANEL WITH GFR   CBC with Differential/Platelet    Other Visit Diagnoses    Numbness and tingling of foot       Relevant Orders   Vitamin B12   Vitamin B deficiency       Needs flu shot       Relevant Orders   Flu Vaccine QUAD High Dose(Fluad)      #Will order blood panel, patient can return 1-2 weeks for thyroid, A1c, lipids, chemistry, CBC and vitamin testing to evaluate her concerns with fatigue and thyroid concerns, overdue for  labs.  #Flu shot today  #Hypothyroidism Overdue for lab Continue levothyroxine daily Check labs re-evaluate  #Numbness feet, bilateral, plantar -  Unclear etiology, bilateral symmetrical gradual worsening, without pain or significant deformity or other known cause. May be nerve compression with pes cavus high arches possibly footwear related but less likely, A1c 5.7 range, not consistent with Dm can repeat A1c with labs, will check B12 as well for other cause of neuropathy and thyroid. - Reassurance today - Defer further neuro or podiatry work up today, reconsider after labs.  #Major depression recurrent chronic Worsening recently with life stressors, sick husband Continue currently on Fluoxetine 40mg  daily, we discussed goal to follow up on her mood more closely after labs to ensure no other abnormality, we can discuss within few weeks if want to find alternative options for treatment of mood, and consider refer to Psych or therapist if needed   No orders of the defined types were placed in this encounter.     Follow up plan: Return in about 3 weeks (around 10/15/2020) for Follow-up 2-4 weeks for lab results, Mood/Depression PHQ.  Future labs ordered for this week.  10/17/2020, DO [facility] 09/24/2020, 11:26 AM

## 2020-09-25 DIAGNOSIS — R7303 Prediabetes: Secondary | ICD-10-CM | POA: Diagnosis not present

## 2020-09-25 DIAGNOSIS — Z23 Encounter for immunization: Secondary | ICD-10-CM

## 2020-09-25 DIAGNOSIS — E039 Hypothyroidism, unspecified: Secondary | ICD-10-CM | POA: Diagnosis not present

## 2020-09-25 DIAGNOSIS — E782 Mixed hyperlipidemia: Secondary | ICD-10-CM | POA: Diagnosis not present

## 2020-09-25 DIAGNOSIS — R5382 Chronic fatigue, unspecified: Secondary | ICD-10-CM | POA: Diagnosis not present

## 2020-09-25 DIAGNOSIS — M797 Fibromyalgia: Secondary | ICD-10-CM | POA: Diagnosis not present

## 2020-09-25 DIAGNOSIS — R202 Paresthesia of skin: Secondary | ICD-10-CM | POA: Diagnosis not present

## 2020-09-25 DIAGNOSIS — E539 Vitamin B deficiency, unspecified: Secondary | ICD-10-CM | POA: Diagnosis not present

## 2020-09-25 DIAGNOSIS — R200 Anesthesia of skin: Secondary | ICD-10-CM | POA: Diagnosis not present

## 2020-10-16 ENCOUNTER — Ambulatory Visit: Payer: Medicare PPO | Admitting: Family Medicine

## 2020-11-20 ENCOUNTER — Other Ambulatory Visit: Payer: Self-pay | Admitting: Cardiovascular Disease

## 2020-11-20 DIAGNOSIS — I4891 Unspecified atrial fibrillation: Secondary | ICD-10-CM

## 2020-11-25 NOTE — Telephone Encounter (Signed)
Attempted to schedule.  LMOV to call office.  ° °

## 2020-11-25 NOTE — Telephone Encounter (Signed)
Pt overdue for 12 month f/u please contact pt for future appointment last seen 07/2019

## 2020-11-27 NOTE — Telephone Encounter (Signed)
Attempted to schedule.  LMOV to call office.  ° °

## 2020-11-28 NOTE — Telephone Encounter (Signed)
Scheduled for tomorrow.

## 2020-11-29 ENCOUNTER — Encounter: Payer: Self-pay | Admitting: Family

## 2020-11-29 ENCOUNTER — Other Ambulatory Visit: Payer: Self-pay

## 2020-11-29 ENCOUNTER — Ambulatory Visit: Payer: Medicare PPO | Admitting: Family

## 2020-11-29 VITALS — BP 110/62 | HR 56 | Ht 63.0 in | Wt 153.0 lb

## 2020-11-29 DIAGNOSIS — E782 Mixed hyperlipidemia: Secondary | ICD-10-CM

## 2020-11-29 DIAGNOSIS — I480 Paroxysmal atrial fibrillation: Secondary | ICD-10-CM | POA: Diagnosis not present

## 2020-11-29 DIAGNOSIS — I10 Essential (primary) hypertension: Secondary | ICD-10-CM

## 2020-11-29 DIAGNOSIS — E032 Hypothyroidism due to medicaments and other exogenous substances: Secondary | ICD-10-CM

## 2020-11-29 DIAGNOSIS — Z7901 Long term (current) use of anticoagulants: Secondary | ICD-10-CM

## 2020-11-29 DIAGNOSIS — R002 Palpitations: Secondary | ICD-10-CM | POA: Diagnosis not present

## 2020-11-29 NOTE — Patient Instructions (Addendum)
Medication Instructions:  No medication changes today.   If you have breakthrough palpitations you may take an extra Metoprolol as needed.   *If you need a refill on your cardiac medications before your next appointment, please call your pharmacy*  Lab Work: No lab work today. Please contact your primary care office to have your labs collected, they have already been ordered by their office.   Testing/Procedures: Your EKG today shows sinus bradycardia - this is a stable finding.   Follow-Up: At [facility], you and your health needs are our priority.  As part of our continuing mission to provide you with exceptional heart care, we have created designated Provider Care Teams.  These Care Teams include your primary Cardiologist (physician) and Advanced Practice Providers (APPs -  Physician Assistants and Nurse Practitioners) who all work together to provide you with the care you need, when you need it.  We recommend signing up for the patient portal called "MyChart".  Sign up information is provided on this After Visit Summary.  MyChart is used to connect with patients for Virtual Visits (Telemedicine).  Patients are able to view lab/test results, encounter notes, upcoming appointments, etc.  Non-urgent messages can be sent to your provider as well.   To learn more about what you can do with MyChart, go to [email or web address].    Your next appointment:   1 year(s)  The format for your next appointment:   In Person  Provider:   You may see Julien Nordmann, MD or one of the following Advanced Practice Providers on your designated Care Team:    Nicolasa Ducking, NP  Eula Listen, PA-C  Marisue Ivan, PA-C  Cad[place], [place]  Gillian Shields, NP

## 2020-11-29 NOTE — Progress Notes (Signed)
Office Visit    Patient Name: Stacey Duncan Date of Encounter: 11/29/2020  Primary Care Provider:  Smitty Cords, DO Primary Cardiologist:  Julien Nordmann, MD Electrophysiologist:  None   Chief Complaint    Stacey Duncan is a 86 y.o. female with a hx of paroxysmal atrial fibrillation on chronic anticoagulation, HTN, HLD, fibromyalgia, GERD, hypothyroidism presents today for follow up of atrial fibrillation   Past Medical History    Past Medical History:  Diagnosis Date  . Anxiety   . Fibromyalgia   . GERD (gastroesophageal reflux disease)   . History of echocardiogram    a. 02/2016 Echo: EF 60-65%, no rwma. Nl RV fxn and PASP.  Marland Kitchen HTN (hypertension)   . Hypothyroidism   . Mixed hyperlipidemia   . Osteoarthritis    a. bilateral knees  . PAF (paroxysmal atrial fibrillation) (HCC)    a. 02/2015 & 09/2017 - admit w/ Afib, briefly req Amio; b. CHA2DSVASc = 4-->Eliquis.  . Recurrent UTI (urinary tract infection)   . Sinus bradycardia    a. Has limited beta blocker dosing.   Past Surgical History:  Procedure Laterality Date  . ABDOMINAL HYSTERECTOMY    . CHOLECYSTECTOMY    . ROTATOR CUFF REPAIR     left     Allergies  Allergies  Allergen Reactions  . Sulfa Antibiotics Other (See Comments)  . Sulfacetamide Sodium     Other reaction(s): Other (See Comments)    History of Present Illness    Stacey Duncan is a 86 y.o. female with a hx of paroxysmal atrial fibrillation on chronic anticoagulation, HTN, HLD, fibromyalgia, GERD, hypothyroidism last seen 07/2019 by Dr. Mariah Milling.  She was diagnosed with PAF 02/2015 and had recurrent atrial fibrillation 09/2017. Both required IV Amiodarone doses for conversion. Due to CHADS2VASc of 4 she has been on chronic Eliquis.   Previous echocardiogram 06/2016 with LVEF 60% and no significant valvular abnormalities. Noted difficult images. She wore a ZIO monitor 07/20/19 which showed predominantly NSR With 8 runs of  SVT/atrial tachycardia with fastest 4 beats at 174 bpm and longest 14 beats with average rate of 135bpm. She had rare PVC/PAC. Triggered events were not associated with significant arrhythmia. Escalation of her beta blocker has been previously limited by bradycardia.   She presents today for follow-up.  Her husband who is also a patient of our office as recently had some health difficulties and she tells me he is improving and she is very pleased.  Reports her symptoms of palpitations are well controlled.  She notes taking an additional metoprolol approximately once every 3 months for breakthrough palpitations.  She monitors her BP at home and it is routinely well controlled.  No chest pain, pressure, tightness.  No shortness of breath at rest nor dyspnea on exertion.  No lightheadedness, dizziness, near-syncope, syncope  EKGs/Labs/Other Studies Reviewed:   The following studies were reviewed today:  Long term monitor 07/20/19 Predominant underlying rhythm was Sinus Rhythm.    8 Supraventricular Tachycardia/atrial tachycardia runs occurred, the run with the fastest interval lasting 4 beats with a max rate of 174 bpm,  the longest lasting 14 beats with an avg rate of 135 bpm.     Isolated SVEs were rare (<1.0%), SVE Couplets were rare (<1.0%), and SVE Triplets were rare (<1.0%). Isolated VEs were rare (<1.0%, 645), VE Couplets were rare (<1.0%, 1), and VE Triplets were rare (<1.0%, 1).  Ventricular Bigeminy and Trigeminy were present.   Patient triggered event was  not associated with significant arrhythmia.    Echo 03/13/2016 Procedure narrative: Transthoracic echocardiography. Image    quality was poor. The study was technically difficult, as a    result of poor sound wave transmission.  - Left ventricle: The cavity size was normal. Systolic function was    normal. The estimated ejection fraction was in the range of 60%    to 65%. Wall motion was normal; there were no regional wall    motion  abnormalities. Left ventricular diastolic function    parameters were normal.  - Right ventricle: Systolic function was normal.  - Pulmonary arteries: Systolic pressure was within the normal    range.   Impressions:   - Normal study.   EKG:  EKG is  ordered today.  The ekg ordered today demonstrates sinus bradycardia 56 bpm with no acute ST/T wave changes.  Recent Labs: No results found for requested labs within last 8760 hours.  Recent Lipid Panel    Component Value Date/Time   CHOL 163 08/09/2019 0000   CHOL 267 (H) 06/22/2019 1424   TRIG 115 08/09/2019 0000   HDL 49 08/09/2019 0000   HDL 50 06/22/2019 1424   CHOLHDL 5.3 (H) 06/22/2019 1424   CHOLHDL 5.1 (H) 05/24/2018 0748   VLDL 28 02/03/2017 0000   LDLCALC 93 08/09/2019 0000   LDLCALC 185 (H) 06/22/2019 1424   LDLCALC 192 (H) 05/24/2018 0748   LDLDIRECT 196 (H) 06/22/2019 1424    Home Medications   Current Meds  Medication Sig  . apixaban (ELIQUIS) 5 MG TABS tablet TAKE ONE (1) TABLET BY MOUTH TWO (2) TIMES DAILY  . atorvastatin (LIPITOR) 80 MG tablet Take 1 tablet (80 mg total) by mouth daily at 6 PM.  . Cholecalciferol (VITAMIN D-1000 MAX ST) 1000 UNITS tablet Take 1,000 Units by mouth daily.   . diclofenac Sodium (QC DICLOFENAC SODIUM) 1 % GEL Apply 2 g topically 3 (three) times daily as needed. For arthritis of hands, elbow  . FLUoxetine (PROZAC) 40 MG capsule TAKE 1 CAPSULE EVERY DAY  . gabapentin (NEURONTIN) 300 MG capsule Take 1 capsule (300mg ) in morning, take 1 cap (300mg ) in afternoon, and take 2 caps (600mg ) in evening.  . hydrALAZINE (APRESOLINE) 25 MG tablet Take 1 tablet (25 mg total) by mouth 2 (two) times daily.  levothyroxine (SYNTHROID) 100 MCG tablet Take 1 tablet (100 mcg total) by mouth daily.  nystatin (MYCOSTATIN/NYSTOP) powder Apply 1 application topically 3 (three) times daily as needed. For 2 to 4 weeks as needed  . omeprazole (PRILOSEC) 40 MG capsule Take 1 capsule (40 mg total) by mouth  daily as needed.  . [DISCONTINUED] metoprolol tartrate (LOPRESSOR) 25 MG tablet TAKES 1/2 TABLET TWICE A DAY     Review of Systems  All other systems reviewed and are otherwise negative except as noted above.  Physical Exam    VS:  BP 110/62 (BP Location: Left Arm, Patient Position: Sitting, Cuff Size: Normal)   Pulse (!) 56   Ht 5\' 3"  (1.6 m)   Wt 153 lb (69.4 kg)   SpO2 95%   BMI 27.10 kg/m  , BMI Body mass index is 27.1 kg/m.  Wt Readings from Last 3 Encounters:  11/29/20 153 lb (69.4 kg)  09/24/20 150 lb (68 kg)  09/04/20 150 lb (68 kg)    GEN: Well nourished, well developed, in no acute distress. HEENT: normal. Neck: Supple, no JVD, carotid bruits, or masses. Cardiac: RRR, no murmurs, rubs, or gallops.  No clubbing, cyanosis, edema.  Radials/DP/PT 2+ and equal bilaterally.  Respiratory:  Respirations regular and unlabored, clear to auscultation bilaterally. GI: Soft, nontender, nondistended. MS: No deformity or atrophy. Skin: Warm and dry, no rash. Neuro:  Strength and sensation are intact. Psych: Normal affect.  Assessment & Plan    1. PAF / Palpitations -no evidence of recurrence.  EKG today demonstrates sinus bradycardia.  Continue metoprolol tartrate 12.5 mg twice daily.  Refill provided.  Unable to further escalate dose due to beta-blocker.  2. Chronic anticoagulation -due to PAF and CHA2DS2-VASc of at least 4 (agex2, gender, HTN) 05/2019 Hb 13.3.  Denies bleeding complications.  Continue Eliquis 5 mg twice daily.  She does not meet dose reduction criteria.  She is due for routine monitoring of her hemoglobin and she is upcoming lab work with her PCP.  3. Hypothyroidism - Continue to follow with PCP.   4. HLD - Lipid panel 07/2019 with LDL 93. Continue Atorvastatin 80mg  daily.   5. HTN - BP well controlled. Continue current antihypertensive regimen.  She is due for routine labs for monitoring of her renal function and has placed upcoming with her primary care  provider.   Disposition: Follow up in 1 year(s) with Dr. or APP  Signed, Mariah Milling, NP 11/29/2020, 4:38 PM [facility]

## 2020-12-05 ENCOUNTER — Other Ambulatory Visit: Payer: Self-pay | Admitting: Family Medicine

## 2020-12-05 DIAGNOSIS — M8949 Other hypertrophic osteoarthropathy, multiple sites: Secondary | ICD-10-CM

## 2020-12-05 DIAGNOSIS — M797 Fibromyalgia: Secondary | ICD-10-CM

## 2020-12-05 DIAGNOSIS — M159 Polyosteoarthritis, unspecified: Secondary | ICD-10-CM

## 2020-12-05 MED ORDER — GABAPENTIN 300 MG PO CAPS: ORAL_CAPSULE | ORAL | 0 refills | Status: DC

## 2020-12-05 NOTE — Telephone Encounter (Signed)
Medication Refill - Medication:  Gabapentin, (Patient would like supply sent to local pharmacy as she awaits mail order )  Has the patient contacted their pharmacy? yes (Agent: If no, request that the patient contact the pharmacy for the refill.) (Agent: If yes, when and what did the pharmacy advise?)Contact PCP  Preferred Pharmacy (with phone number or street name): [facility] - [place], [place] - [address] Phone:  [phone]  Fax:  [phone]       Agent: Please be advised that RX refills may take up to 3 business days. We ask that you follow-up with your pharmacy.

## 2020-12-26 ENCOUNTER — Other Ambulatory Visit: Payer: Self-pay

## 2020-12-26 MED ORDER — ATORVASTATIN CALCIUM 80 MG PO TABS: 80.0000 mg | ORAL_TABLET | Freq: Every day | ORAL | 2 refills | Status: DC

## 2021-01-06 ENCOUNTER — Telehealth: Payer: Self-pay | Admitting: Cardiovascular Disease

## 2021-01-06 MED ORDER — APIXABAN 5 MG PO TABS
ORAL_TABLET | ORAL | 0 refills | Status: DC
Start: 2021-01-06 — End: 2021-01-20

## 2021-01-06 NOTE — Telephone Encounter (Signed)
I spoke with pt earlier to inform her that her husband's Eliquis samples are available for pick up---ok to speak with wife per DPR. This was documented in her husband's chart. At that time, pt did not verbalize needing samples for herself. Spoke with pt again to ask if they both needed samples. Pt stated her husband still has 15 tablets left, but she is out. It is documented in her husbands chart that he is out of Eliquis and needing samples. Informed pt that samples will be left at the front desk of our office for both her and her husband. Pt verbalized thanks.

## 2021-01-06 NOTE — Telephone Encounter (Signed)
Refill request

## 2021-01-06 NOTE — Telephone Encounter (Addendum)
Prescription refill request for Eliquis received.  Indication: Afib Last office visit: Dan Humphreys, 11/29/2020 Scr: 0.8, 06/22/2019 Age: 87 yo Weight: 69.4 kg    Called and spoke to pt who stated she talked to someone at the [place] office who stated that she could pick up samples. Pt only has 2 days worth of Eliquis left. Pt is overdue for labs, pt stated that she would like to have her labs done at Dr Enid Skeens office. Pt stated she would call his office to schedule a lab appointment and made her aware that she will need a CBC and BMET drawn. Will go ahead and send in refill so pt will be able to get medication, but will only send it in for a 90 day supply no refills, so her labs can be reassessed at next refill.    Prescription refill sent.

## 2021-01-06 NOTE — Telephone Encounter (Signed)
*  STAT* If patient is at the pharmacy, call can be transferred to refill team.   1. Which medications need to be refilled? (please list name of each medication and dose if known) ELIQUIS 5 MG   2. Which pharmacy/location (including street and city if local pharmacy) is medication to be sent to? [facility]   3. Do they need a 30 day or 90 day supply? 90 day   Patient will be out of medication on Wednesday.  Would like to know if she can have some samples while waiting on refill.  Please call.

## 2021-01-07 ENCOUNTER — Other Ambulatory Visit: Payer: Medicare PPO

## 2021-01-07 ENCOUNTER — Other Ambulatory Visit: Payer: Self-pay | Admitting: Cardiovascular Disease

## 2021-01-08 ENCOUNTER — Other Ambulatory Visit: Payer: Medicare PPO

## 2021-01-08 DIAGNOSIS — R5382 Chronic fatigue, unspecified: Secondary | ICD-10-CM | POA: Diagnosis not present

## 2021-01-08 DIAGNOSIS — E039 Hypothyroidism, unspecified: Secondary | ICD-10-CM | POA: Diagnosis not present

## 2021-01-08 DIAGNOSIS — R202 Paresthesia of skin: Secondary | ICD-10-CM | POA: Diagnosis not present

## 2021-01-08 DIAGNOSIS — R200 Anesthesia of skin: Secondary | ICD-10-CM | POA: Diagnosis not present

## 2021-01-08 DIAGNOSIS — R7303 Prediabetes: Secondary | ICD-10-CM | POA: Diagnosis not present

## 2021-01-08 DIAGNOSIS — E782 Mixed hyperlipidemia: Secondary | ICD-10-CM | POA: Diagnosis not present

## 2021-01-09 LAB — CBC WITH DIFFERENTIAL/PLATELET
Absolute Monocytes: 600 cells/uL (ref 200–950)
Basophils Absolute: 80 cells/uL (ref 0–200)
Basophils Relative: 1 %
Eosinophils Absolute: 328 cells/uL (ref 15–500)
Eosinophils Relative: 4.1 %
HCT: 40.9 % (ref 35.0–45.0)
Hemoglobin: 13.6 g/dL (ref 11.7–15.5)
Lymphs Abs: 3376 cells/uL (ref 850–3900)
MCH: 29.9 pg (ref 27.0–33.0)
MCHC: 33.3 g/dL (ref 32.0–36.0)
MCV: 89.9 fL (ref 80.0–100.0)
MPV: 10.3 fL (ref 7.5–12.5)
Monocytes Relative: 7.5 %
Neutro Abs: 3616 cells/uL (ref 1500–7800)
Neutrophils Relative %: 45.2 %
Platelets: 317 10*3/uL (ref 140–400)
RBC: 4.55 10*6/uL (ref 3.80–5.10)
RDW: 13.2 % (ref 11.0–15.0)
Total Lymphocyte: 42.2 %
WBC: 8 10*3/uL (ref 3.8–10.8)

## 2021-01-09 LAB — COMPLETE METABOLIC PANEL WITH GFR
AG Ratio: 1.6 (calc) (ref 1.0–2.5)
ALT: 14 U/L (ref 6–29)
AST: 18 U/L (ref 10–35)
Albumin: 4.1 g/dL (ref 3.6–5.1)
Alkaline phosphatase (APISO): 73 U/L (ref 37–153)
BUN/Creatinine Ratio: 13 (calc) (ref 6–22)
BUN: 12 mg/dL (ref 7–25)
CO2: 25 mmol/L (ref 20–32)
Calcium: 9.3 mg/dL (ref 8.6–10.4)
Chloride: 103 mmol/L (ref 98–110)
Creat: 0.91 mg/dL — ABNORMAL HIGH (ref 0.60–0.88)
GFR, Est African American: 66 mL/min/{1.73_m2} (ref >=60)
GFR, Est Non African American: 57 mL/min/{1.73_m2} — ABNORMAL LOW (ref >=60)
Globulin: 2.6 g/dL (calc) (ref 1.9–3.7)
Glucose, Bld: 97 mg/dL (ref 65–99)
Potassium: 4.4 mmol/L (ref 3.5–5.3)
Sodium: 138 mmol/L (ref 135–146)
Total Bilirubin: 0.5 mg/dL (ref 0.2–1.2)
Total Protein: 6.7 g/dL (ref 6.1–8.1)

## 2021-01-09 LAB — LIPID PANEL
Cholesterol: 183 mg/dL (ref <200)
HDL: 53 mg/dL (ref >=50)
LDL Cholesterol (Calc): 107 mg/dL (calc) — ABNORMAL HIGH
Non-HDL Cholesterol (Calc): 130 mg/dL (calc) — ABNORMAL HIGH (ref <130)
Total CHOL/HDL Ratio: 3.5 (calc) (ref <5.0)
Triglycerides: 119 mg/dL (ref <150)

## 2021-01-09 LAB — T4, FREE: Free T4: 1 ng/dL (ref 0.8–1.8)

## 2021-01-09 LAB — VITAMIN B12: Vitamin B-12: 302 pg/mL (ref 200–1100)

## 2021-01-09 LAB — HEMOGLOBIN A1C
Hgb A1c MFr Bld: 5.9 % of total Hgb — ABNORMAL HIGH (ref <5.7)
Mean Plasma Glucose: 123 mg/dL
eAG (mmol/L): 6.8 mmol/L

## 2021-01-09 LAB — TSH: TSH: 8.72 mIU/L — ABNORMAL HIGH (ref 0.40–4.50)

## 2021-01-15 ENCOUNTER — Ambulatory Visit: Payer: Medicare PPO | Admitting: Family Medicine

## 2021-01-18 ENCOUNTER — Other Ambulatory Visit: Payer: Self-pay | Admitting: Cardiovascular Disease

## 2021-01-20 NOTE — Telephone Encounter (Signed)
Please review for refill. Thanks!  

## 2021-01-20 NOTE — Telephone Encounter (Signed)
10f, 69.4kg, scr 0.91 (01/08/21), lovw/walker 11/29/20

## 2021-01-22 ENCOUNTER — Encounter: Payer: Self-pay | Admitting: Family Medicine

## 2021-01-22 ENCOUNTER — Other Ambulatory Visit: Payer: Self-pay

## 2021-01-22 ENCOUNTER — Ambulatory Visit (INDEPENDENT_AMBULATORY_CARE_PROVIDER_SITE_OTHER): Payer: Medicare PPO | Admitting: Family Medicine

## 2021-01-22 VITALS — BP 133/53 | HR 56 | Ht 62.0 in | Wt 155.2 lb

## 2021-01-22 DIAGNOSIS — R7303 Prediabetes: Secondary | ICD-10-CM

## 2021-01-22 DIAGNOSIS — I10 Essential (primary) hypertension: Secondary | ICD-10-CM | POA: Diagnosis not present

## 2021-01-22 DIAGNOSIS — F339 Major depressive disorder, recurrent, unspecified: Secondary | ICD-10-CM | POA: Diagnosis not present

## 2021-01-22 DIAGNOSIS — E782 Mixed hyperlipidemia: Secondary | ICD-10-CM

## 2021-01-22 DIAGNOSIS — E032 Hypothyroidism due to medicaments and other exogenous substances: Secondary | ICD-10-CM

## 2021-01-22 DIAGNOSIS — I480 Paroxysmal atrial fibrillation: Secondary | ICD-10-CM

## 2021-01-22 DIAGNOSIS — K219 Gastro-esophageal reflux disease without esophagitis: Secondary | ICD-10-CM

## 2021-01-22 MED ORDER — LEVOTHYROXINE SODIUM 112 MCG PO TABS: 112.0000 ug | ORAL_TABLET | Freq: Every day | ORAL | 1 refills | Status: DC

## 2021-01-22 NOTE — Patient Instructions (Addendum)
Thank you for coming to the office today.  RESTART Omeprazole 40mg  daily in morning before breakfast, every day to help control indigestion acid reflux symptoms.  -------------------------------  Please discuss your symptoms with Dr at Cardiology office may need other cardiac testing. Your LDL is at 107, may benefit from other add on therapy in future for cholesterol.  Increase Levothyroxine Mariah Milling daily, new rx sent. Based on lab result  1. Chemistry - Normal results, including electrolytes, kidney and liver function. Normal fasting blood sugar   2. Hemoglobin A1c (Diabetes screening) - 5.9, in range of Pre-Diabetes (>5.7 to 6.4) slightly elevated from 5.6 to 5.7 now up to 5.9  3. TSH Thyroid Function Tests - Elevated TSH to 8.72, normal Free T4 at 1.0. may reconsider dose change based on any symptoms.  4. Cholesterol - Controlled mostly, with LDL at 107, on Atorvastatin 80  5. CBC Blood Counts - Normal, no anemia, other abnormality  6. Vitamin B12 - normal range 302  ----------------------------------------------------------  Please schedule a Follow-up Appointment to: Return in about 6 months (around 07/22/2021) for 6 month fasting lab only then 1 week later Annual Physical .  If you have any other questions or concerns, please feel free to call the office or send a message through MyChart. You may also schedule an earlier appointment if necessary.  Additionally, you may be receiving a survey about your experience at our office within a few days to 1 week by e-mail or mail. We value your feedback.  07/24/2021, DO [facility], [facility]

## 2021-01-22 NOTE — Progress Notes (Signed)
Subjective:    Patient ID: Stacey Duncan, female    DOB: Dec 22, 1931, 87 y.o.   MRN: [id number]  Stacey Duncan is a 87 y.o. female presenting on 01/22/2021 for Follow-up and Hypothyroidism   HPI   Hyperlipidemia Paroxysmal Atrial Fibrillation with palpitations Followed by [facility] On Atorvastatin 80 Last lab shows LDL 107 She also admits not feeling rapid heart beat or symptoms significantly with palpitations. Now recent episode of chest and neck discomfort, she got up overnight to use restroom and then developed this episode.  Hypothyroidism TSH Thyroid Function Tests - Elevated TSH to 8.72, normal Free T4 at 1.0. may reconsider dose change based on any symptoms She feels like still fatigue and sluggish and would benefit from dosage increase of Levothyroxine Previous dose Levothyroxine daily  She is doing a lot of reading and jigsaw puzzles She is not exercising as often, admits some weakness.  Pre-Diabetes  Lab result A1c up to 5.9 CBG not checking Meds:none Currently on ARB Diet: improveddiet. Low carb. - admits some dietary issues with holidays Exercise: limited activity due to pain and fibromyalgia, increasedtiredness Denies hypoglycemia, urinary frequency, polyuria  Fibromyalgia/ Osteoarthritis Chronic problem. No new concerns today  Bilateral Foot Numbness  Major Depression, chronic recurrent moderate Unchanged since last visit. Still has significant mood issues often due to losses in family. - she is still very anxious and worrying about her husband, he may have congestive heart failure - She is currently still taking Fluoxetine 40mg  daily takes with yogurt or milk to help indigestion - she has GERD and reflux symptoms - She has been on Zoloft in past did not do well, highest dose of Fluoxetine 60mg . - she states she has good support system, but needs help, she has talked to hospice psychologist in past with bereavement, she will start  there, declines other referral to therapist today  Health Maintenance: UTD COVID19 vaccine updated including booster, Pfizer. Will call with LOT#  Depression screen [facility] 2/9 01/22/2021 09/24/2020 02/27/2020  Decreased Interest 2 2 0  Down, Depressed, Hopeless 1 1 1   PHQ - 2 Score 3 3 1   Altered sleeping 1 1 0  Tired, decreased energy 2 2 2   Change in appetite 3 3 0  Feeling bad or failure about yourself  0 0 0  Trouble concentrating 0 0 0  Moving slowly or fidgety/restless 0 0 1  Suicidal thoughts 0 0 0  PHQ-9 Score 9 9 4   Difficult doing work/chores Somewhat difficult Somewhat difficult Not difficult at all  Some recent data might be hidden   GAD 7 : Generalized Anxiety Score 09/24/2020 02/27/2020 11/09/2017 10/20/2017  Nervous, Anxious, on Edge 2 1 1 3   Control/stop worrying 2 0 3 3  Worry too much - different things 2 1 2  0  Trouble relaxing 1 1 0 1  Restless 0 0 0 3  Easily annoyed or irritable 2 1 3 2   Afraid - awful might happen 2 1 1 3   Total GAD 7 Score 11 5 10 15   Anxiety Difficulty Somewhat difficult Not difficult at all Somewhat difficult Very difficult     Social History   Tobacco Use  . Smoking status: Never Smoker  . Smokeless tobacco: Never Used  Vaping Use  . Vaping Use: Never used  Substance Use Topics  . Alcohol use: Yes    Alcohol/week: 7.0 standard drinks    Types: 7 Glasses of wine per week    Comment: glass of wine daily  .  Drug use: No    Review of Systems Per HPI unless specifically indicated above     Objective:    BP (!) 133/53   Pulse (!) 56   Ht 5\' 2"  (1.575 m)   Wt 155 lb 3.2 oz (70.4 kg)   SpO2 96%   BMI 28.39 kg/m   Wt Readings from Last 3 Encounters:  01/22/21 155 lb 3.2 oz (70.4 kg)  11/29/20 153 lb (69.4 kg)  09/24/20 150 lb (68 kg)    Physical Exam Vitals and nursing note reviewed.  Constitutional:      General: She is not in acute distress.    Appearance: She is well-developed and well-nourished. She is not diaphoretic.      Comments: Well-appearing, comfortable, cooperative  HENT:     Head: Normocephalic and atraumatic.     Mouth/Throat:     Mouth: Oropharynx is clear and moist.  Eyes:     General:        Right eye: No discharge.        Left eye: No discharge.     Conjunctiva/sclera: Conjunctivae normal.  Neck:     Thyroid: No thyromegaly.  Cardiovascular:     Rate and Rhythm: Normal rate and regular rhythm.     Pulses: Intact distal pulses.     Heart sounds: Normal heart sounds. No murmur heard.     Comments: No ectopy Pulmonary:     Effort: Pulmonary effort is normal. No respiratory distress.     Breath sounds: Normal breath sounds. No wheezing or rales.  Musculoskeletal:        General: No edema. Normal range of motion.     Cervical back: Normal range of motion and neck supple.  Lymphadenopathy:     Cervical: No cervical adenopathy.  Skin:    General: Skin is warm and dry.     Findings: No erythema or rash.  Neurological:     Mental Status: She is alert and oriented to person, place, and time.  Psychiatric:        Mood and Affect: Mood and affect normal.        Behavior: Behavior normal.     Comments: Well groomed, good eye contact, normal speech and thoughts        Results for orders placed or performed in visit on 09/24/20  TSH  Result Value Ref Range   TSH 8.72 (H) 0.40 - 4.50 mIU/L  T4, free  Result Value Ref Range   Free T4 1.0 0.8 - 1.8 ng/dL  COMPLETE METABOLIC PANEL WITH GFR  Result Value Ref Range   Glucose, Bld 97 65 - 99 mg/dL   BUN 12 7 - 25 mg/dL   Creat 09/26/20 (H) 7.82 - 0.88 mg/dL   GFR, Est Non African American 57 (L) > OR = 60 mL/min/1.1m2   GFR, Est African American 66 > OR = 60 mL/min/1.48m2   BUN/Creatinine Ratio 13 6 - 22 (calc)   Sodium 138 135 - 146 mmol/L   Potassium 4.4 3.5 - 5.3 mmol/L   Chloride 103 98 - 110 mmol/L   CO2 25 20 - 32 mmol/L   Calcium 9.3 8.6 - 10.4 mg/dL   Total Protein 6.7 6.1 - 8.1 g/dL   Albumin 4.1 3.6 - 5.1 g/dL    Globulin 2.6 1.9 - 3.7 g/dL (calc)   AG Ratio 1.6 1.0 - 2.5 (calc)   Total Bilirubin 0.5 0.2 - 1.2 mg/dL   Alkaline phosphatase (APISO) 73 37 - 153  U/L   AST 18 10 - 35 U/L   ALT 14 6 - 29 U/L  CBC with Differential/Platelet  Result Value Ref Range   WBC 8.0 3.8 - 10.8 Thousand/uL   RBC 4.55 3.80 - 5.10 Million/uL   Hemoglobin 13.6 11.7 - 15.5 g/dL   HCT 84.6 65.9 - 93.5 %   MCV 89.9 80.0 - 100.0 fL   MCH 29.9 27.0 - 33.0 pg   MCHC 33.3 32.0 - 36.0 g/dL   RDW 70.1 77.9 - 39.0 %   Platelets 317 140 - 400 Thousand/uL   MPV 10.3 7.5 - 12.5 fL   Neutro Abs 3,616 1,500 - 7,800 cells/uL   Lymphs Abs 3,376 850 - 3,900 cells/uL   Absolute Monocytes 600 200 - 950 cells/uL   Eosinophils Absolute 328 15 - 500 cells/uL   Basophils Absolute 80 0 - 200 cells/uL   Neutrophils Relative % 45.2 %   Total Lymphocyte 42.2 %   Monocytes Relative 7.5 %   Eosinophils Relative 4.1 %   Basophils Relative 1.0 %  Hemoglobin A1c  Result Value Ref Range   Hgb A1c MFr Bld 5.9 (H) <5.7 % of total Hgb   Mean Plasma Glucose 123 mg/dL   eAG (mmol/L) 6.8 mmol/L  Vitamin B12  Result Value Ref Range   Vitamin B-12 302 200 - 1,100 pg/mL  Lipid panel  Result Value Ref Range   Cholesterol 183 <200 mg/dL   HDL 53 > OR = 50 mg/dL   Triglycerides 300 <923 mg/dL   LDL Cholesterol (Calc) 107 (H) mg/dL (calc)   Total CHOL/HDL Ratio 3.5 <5.0 (calc)   Non-HDL Cholesterol (Calc) 130 (H) <130 mg/dL (calc)      Assessment & Plan:   Problem List Items Addressed This Visit    Pre-diabetes - Primary    Mild elevated A1c to 5.9 Sedentary  Plan:  1. Not on any therapy currently  2. Encourage improved lifestyle - low carb, low sugar diet, reduce portion size, continue improving regular exercise as tolerated      Paroxysmal atrial fibrillation (HCC)    Stable without evidence of AFib today on exam Rate control on Metoprolol low dose On anticoagulation Eliquis Followed by [facility]      Major depression,  recurrent, chronic (HCC)    Stable chronic mood, on SSRI Improved on Prozac Failed Sertraline Supsected secondary insomnia component causing tired/fatigue  Plan Continue Fluoxetine Prozac 40mg  daily, discussed option to switch in future, may have GI side effect - consider other SSRI vs SNRI option, possibly wellbutrin.      Hypothyroidism    TSH elevated 8 Normal free T4 Symptoms of hypothyroidism Will increase dosage to Levothyroxine daily now      Relevant Medications   levothyroxine (SYNTHROID) 112 MCG tablet   Hyperlipidemia    Significantly improved now Controlled cholesterol on statin and lifestyle Sitll elevated at LDL 107  Plan: 1. Continue current meds - Atorvastatin 80mg  daily 2. On anticoagulation AFib 3,. Encourage improved lifestyle - low carb/cholesterol, reduce portion size, continue improving regular exercise F/u w/ Cardiology - future may warrant additional therapy      GERD (gastroesophageal reflux disease)    Improved. Now episodic indigestion possibly dyspepsia side effect from Prozac Restart Omeprazole 40mg  daily PRN  Discussed we could switch SSRI if need in future      Essential hypertension    Well-controlled HTN. History of low BP in past - Home BP readings normal  Paroxysmal AFib  Plan:  1. Continue current BP regimen Metoprolol 12.5mg  DAILY 2. Encourage improved lifestyle - low sodium diet, regular exercise 3. Continue monitor BP outside office, bring readings to next visit, if persistently >140/90 or new symptoms notify office sooner         Meds ordered this encounter  Medications  . levothyroxine (SYNTHROID) 112 MCG tablet    Sig: Take 1 tablet (112 mcg total) by mouth daily.    Dispense:  90 tablet    Refill:  1    Change dose from 100 to 112      Follow up plan: Return in about 6 months (around 07/22/2021) for 6 month fasting lab only then 1 week later Annual Physical .    Saralyn Pilar, DO [facility] 01/22/2021, 1:41 PM

## 2021-01-23 NOTE — Assessment & Plan Note (Signed)
Stable chronic mood, on SSRI Improved on Prozac Failed Sertraline Supsected secondary insomnia component causing tired/fatigue  Plan Continue Fluoxetine Prozac 40mg  daily, discussed option to switch in future, may have GI side effect - consider other SSRI vs SNRI option, possibly wellbutrin.

## 2021-01-23 NOTE — Assessment & Plan Note (Signed)
Well-controlled HTN. History of low BP in past - Home BP readings normal  Paroxysmal AFib    Plan:  1. Continue current BP regimen Metoprolol 12.5mg  DAILY 2. Encourage improved lifestyle - low sodium diet, regular exercise 3. Continue monitor BP outside office, bring readings to next visit, if persistently >140/90 or new symptoms notify office sooner

## 2021-01-23 NOTE — Assessment & Plan Note (Signed)
Stable without evidence of AFib today on exam Rate control on Metoprolol low dose On anticoagulation Eliquis Followed by [facility]iology

## 2021-01-23 NOTE — Assessment & Plan Note (Signed)
Improved. Now episodic indigestion possibly dyspepsia side effect from Prozac Restart Omeprazole 40mg  daily PRN  Discussed we could switch SSRI if need in future

## 2021-01-23 NOTE — Assessment & Plan Note (Signed)
TSH elevated 8 Normal free T4 Symptoms of hypothyroidism Will increase dosage to Levothyroxine daily now

## 2021-01-23 NOTE — Assessment & Plan Note (Signed)
Mild elevated A1c to 5.9 Sedentary  Plan:  1. Not on any therapy currently  2. Encourage improved lifestyle - low carb, low sugar diet, reduce portion size, continue improving regular exercise as tolerated

## 2021-01-23 NOTE — Assessment & Plan Note (Signed)
Significantly improved now Controlled cholesterol on statin and lifestyle Sitll elevated at LDL 107  Plan: 1. Continue current meds - Atorvastatin 80mg  daily 2. On anticoagulation AFib 3,. Encourage improved lifestyle - low carb/cholesterol, reduce portion size, continue improving regular exercise F/u w/ Cardiology - future may warrant additional therapy

## 2021-02-14 DIAGNOSIS — M25561 Pain in right knee: Secondary | ICD-10-CM | POA: Diagnosis not present

## 2021-02-14 DIAGNOSIS — M47817 Spondylosis without myelopathy or radiculopathy, lumbosacral region: Secondary | ICD-10-CM | POA: Diagnosis not present

## 2021-02-14 DIAGNOSIS — M25559 Pain in unspecified hip: Secondary | ICD-10-CM | POA: Diagnosis not present

## 2021-02-14 DIAGNOSIS — M5416 Radiculopathy, lumbar region: Secondary | ICD-10-CM | POA: Diagnosis not present

## 2021-02-19 ENCOUNTER — Telehealth: Payer: Self-pay | Admitting: *Deleted

## 2021-02-19 ENCOUNTER — Ambulatory Visit: Payer: Medicare PPO | Admitting: Family Medicine

## 2021-02-19 DIAGNOSIS — R9389 Abnormal findings on diagnostic imaging of other specified body structures: Secondary | ICD-10-CM

## 2021-02-19 DIAGNOSIS — R053 Chronic cough: Secondary | ICD-10-CM

## 2021-02-19 NOTE — Telephone Encounter (Signed)
Reviewed this request. Cannot access X-ray from Ortho.  Reviewed prior x-rays back to 2018, no obvious similar finding.  Will place STAT CXR here at [facility] she can come in on Monday 2/25, will be first day we resume x-ray service. Offered to order X-ray today to go to [facility], she prefers to come here next week, since not urgent. And this is incidental finding.  Apt today cancelled since no symptoms.  Ordered CXR.  Patient notified.  Saralyn Pilar, DO [facility] 02/19/2021, 1:20 PM

## 2021-02-19 NOTE — Telephone Encounter (Signed)
Vickki Hearing, NP  [phone] Calling to report- lower back  X ray- has picked up abnormal finding of lungs- prominence of artifact R lung- suggests possible hemithorax or artifact-recommending chest X-ray. Patient states history of bronchitis with chronic cough.

## 2021-02-19 NOTE — Addendum Note (Signed)
Addended by: Smitty Cords on: 02/19/2021 01:21 PM   Modules accepted: Orders

## 2021-02-19 NOTE — Telephone Encounter (Signed)
The pt was requested to get the chest xray on Monday at our office.

## 2021-02-24 ENCOUNTER — Other Ambulatory Visit: Payer: Self-pay

## 2021-02-24 ENCOUNTER — Other Ambulatory Visit: Payer: Self-pay | Admitting: Family Medicine

## 2021-02-24 ENCOUNTER — Ambulatory Visit
Admission: RE | Admit: 2021-02-24 | Discharge: 2021-02-24 | Disposition: A | Discharge status: Home / Self Care | Payer: Medicare PPO | Attending: Family Medicine | Admitting: Family Medicine

## 2021-02-24 ENCOUNTER — Ambulatory Visit
Admission: RE | Admit: 2021-02-24 | Discharge: 2021-02-24 | Disposition: A | Discharge status: Home / Self Care | Payer: Medicare PPO | Source: Ambulatory Visit | Attending: Family Medicine | Admitting: Family Medicine

## 2021-02-24 DIAGNOSIS — R053 Chronic cough: Secondary | ICD-10-CM

## 2021-02-24 DIAGNOSIS — R9389 Abnormal findings on diagnostic imaging of other specified body structures: Secondary | ICD-10-CM

## 2021-02-24 DIAGNOSIS — R911 Solitary pulmonary nodule: Secondary | ICD-10-CM

## 2021-02-24 DIAGNOSIS — R059 Cough, unspecified: Secondary | ICD-10-CM | POA: Diagnosis not present

## 2021-02-26 ENCOUNTER — Telehealth: Payer: Self-pay

## 2021-02-26 NOTE — Telephone Encounter (Signed)
Patient called about a referral to the Pulmonology Clinic at [facility]. Dr Kirtland Bouchard clarified that the referral was sent on 2/28 and that their office will be calling the patient soon to schedule her for a CT. She is aware and is awaiting their call.

## 2021-02-27 ENCOUNTER — Other Ambulatory Visit: Payer: Self-pay | Admitting: Oncology

## 2021-02-27 DIAGNOSIS — R9389 Abnormal findings on diagnostic imaging of other specified body structures: Secondary | ICD-10-CM

## 2021-02-27 DIAGNOSIS — R911 Solitary pulmonary nodule: Secondary | ICD-10-CM

## 2021-02-27 NOTE — Progress Notes (Signed)
  Pulmonary Nodule Clinic Telephone Note [facility]   HPI: Mrs. Stacey Duncan is an 87 year old female with past medical history significant for atrial fibrillation, hypertension, GERD, hypothyroidism, osteoarthritis, chronic fatigue, depression, fibromyalgia, hyperlipidemia, anxiety and osteopenia who presented to her PCP recently for chronic cough.  Work-up included chest x-ray on 02/24/2021 which showed possible small right lung nodule versus overlap of osseous and vascular structure.  Consider chest CT without contrast for further evaluation.  Review and Recommendations: I personally reviewed all patient's previous imaging including most recent chest x-ray from 02/24/2021.  I recommend follow-up with noncontrast CT of her chest in the next 1 to 2 weeks.  Social History:  Tobacco Use: Low Risk   . Smoking Tobacco Use: Never Smoker  . Smokeless Tobacco Use: Never Used    High risk factors include: History of heavy smoking, exposure to asbestos, radium or uranium, personal family history of lung cancer, older age, sex (females greater than males), race (black and native [place] greater than weight), marginal speculation, upper lobe location, multiplicity (less than 5 nodules increases risk for malignancy) and emphysema and/or pulmonary fibrosis.   This recommendation follows the consensus statement: Guidelines for Management of Incidental Pulmonary Nodules Detected on CT Images: From the Fleischner Society 2017; Radiology 2017; 284:228-243.    I have placed order for CT scan without contrast to be completed approximately 1 to 2 weeks.  Disposition: Order placed for repeat CT chest. Will notify Micael Hampshire in scheduling. [address] to call patient with appointment date and time. Return to pulmonary nodule clinic a few days after his repeat imaging to discuss results and plan moving forward.  Durenda Hurt, NP 02/27/2021 9:09 AM

## 2021-02-28 ENCOUNTER — Telehealth: Payer: Self-pay | Admitting: *Deleted

## 2021-02-28 NOTE — Telephone Encounter (Signed)
Pt made aware of referral to Lung Nodule Clinic from PCP. Pt is in agreement to have upcoming CT scan and follow up as recommended.  Pt has been made aware of upcoming appts for follow up CT scan and follow up appt with Jennifer Burns, NP in the Lung Nodule Clinic. Pt verbalized understanding. Nothing further needed at this time.  

## 2021-03-05 ENCOUNTER — Other Ambulatory Visit: Payer: Self-pay

## 2021-03-05 ENCOUNTER — Ambulatory Visit
Admission: RE | Admit: 2021-03-05 | Discharge: 2021-03-05 | Disposition: A | Discharge status: Home / Self Care | Payer: Medicare PPO | Source: Ambulatory Visit | Attending: Oncology | Admitting: Oncology

## 2021-03-05 DIAGNOSIS — I2510 Atherosclerotic heart disease of native coronary artery without angina pectoris: Secondary | ICD-10-CM | POA: Diagnosis not present

## 2021-03-05 DIAGNOSIS — I700 Atherosclerosis of aorta: Secondary | ICD-10-CM | POA: Diagnosis not present

## 2021-03-05 DIAGNOSIS — R911 Solitary pulmonary nodule: Secondary | ICD-10-CM | POA: Diagnosis not present

## 2021-03-05 DIAGNOSIS — J984 Other disorders of lung: Secondary | ICD-10-CM | POA: Diagnosis not present

## 2021-03-05 DIAGNOSIS — R918 Other nonspecific abnormal finding of lung field: Secondary | ICD-10-CM | POA: Diagnosis not present

## 2021-03-07 ENCOUNTER — Encounter: Payer: Self-pay | Admitting: Oncology

## 2021-03-07 ENCOUNTER — Inpatient Hospital Stay: Payer: Medicare PPO | Attending: Oncology | Admitting: Oncology

## 2021-03-07 VITALS — Wt 153.9 lb

## 2021-03-07 DIAGNOSIS — Z8249 Family history of ischemic heart disease and other diseases of the circulatory system: Secondary | ICD-10-CM | POA: Diagnosis not present

## 2021-03-07 DIAGNOSIS — E039 Hypothyroidism, unspecified: Secondary | ICD-10-CM | POA: Diagnosis not present

## 2021-03-07 DIAGNOSIS — E782 Mixed hyperlipidemia: Secondary | ICD-10-CM | POA: Diagnosis not present

## 2021-03-07 DIAGNOSIS — I10 Essential (primary) hypertension: Secondary | ICD-10-CM | POA: Insufficient documentation

## 2021-03-07 DIAGNOSIS — M8580 Other specified disorders of bone density and structure, unspecified site: Secondary | ICD-10-CM | POA: Insufficient documentation

## 2021-03-07 DIAGNOSIS — F32A Depression, unspecified: Secondary | ICD-10-CM | POA: Diagnosis not present

## 2021-03-07 DIAGNOSIS — Z7901 Long term (current) use of anticoagulants: Secondary | ICD-10-CM | POA: Insufficient documentation

## 2021-03-07 DIAGNOSIS — R9389 Abnormal findings on diagnostic imaging of other specified body structures: Secondary | ICD-10-CM | POA: Diagnosis not present

## 2021-03-07 DIAGNOSIS — R918 Other nonspecific abnormal finding of lung field: Secondary | ICD-10-CM | POA: Diagnosis not present

## 2021-03-07 DIAGNOSIS — IMO0001 Reserved for inherently not codable concepts without codable children: Secondary | ICD-10-CM

## 2021-03-07 DIAGNOSIS — Z79899 Other long term (current) drug therapy: Secondary | ICD-10-CM | POA: Insufficient documentation

## 2021-03-07 DIAGNOSIS — I480 Paroxysmal atrial fibrillation: Secondary | ICD-10-CM | POA: Insufficient documentation

## 2021-03-07 DIAGNOSIS — R911 Solitary pulmonary nodule: Secondary | ICD-10-CM | POA: Insufficient documentation

## 2021-03-07 DIAGNOSIS — F419 Anxiety disorder, unspecified: Secondary | ICD-10-CM | POA: Diagnosis not present

## 2021-03-07 NOTE — Progress Notes (Signed)
Pulmonary Nodule Clinic Consult note [facility]  [phone] [phone]  Patient Care Team: Smitty Cords, DO as PCP - General (Family Medicine) Antonieta Iba, MD as PCP - Cardiology (Cardiology) Antonieta Iba, MD as Consulting Physician (Cardiology) Riki Altes, MD (Urology) Juanell Fairly, MD as Referring Physician (Orthopedic Surgery)   Name of the patient: Stacey Duncan  [id number]  10/02/1931   Date of visit: 03/07/2021   Diagnosis- Lung Nodule  Chief complaint/ Reason for visit- Pulmonary Nodule Clinic Initial Visit  Past Medical History:  Stacey Duncan is an 87 year old female with past medical history significant for atrial fibrillation, hypertension, GERD, hypothyroidism, osteoarthritis, chronic fatigue, depression, fibromyalgia, hyperlipidemia, anxiety and osteopenia who presented to her PCP recently for chronic cough.  Work-up included chest x-ray on 02/24/2021 which showed possible small right lung nodule versus overlap of osseous and vascular structure.  Consider chest CT without contrast for further evaluation.  Interval history- Today, she presents to review most recent CT chest.  Patient lives with her husband at home here locally.  She is retired.  She is currently doing well.  Denies any chronic problems.   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  Denies any known occupational exposure.  Denies any personal history of cancer.  Denies any real family history of cancer.  She is a non-smoker and has never smoked.  Denies any exposure to secondhand smoke.  Over the past 2 weeks, she has had an upper respiratory infection.  Her son tested positive for Covid about 3 weeks ago and she was certain that was what she had but she tested negative.  Symptoms included fatigue, cough with little to no sputum production.  Today, she is starting to feel better.   ECOG FS:0 - Asymptomatic  Review of  systems- Review of Systems  Constitutional: Positive for malaise/fatigue. Negative for chills, fever and weight loss.  HENT: Negative for congestion, ear pain and tinnitus.   Eyes: Negative.  Negative for blurred vision and double vision.  Respiratory: Positive for cough. Negative for sputum production and shortness of breath.   Cardiovascular: Negative.  Negative for chest pain, palpitations and leg swelling.  Gastrointestinal: Negative.  Negative for abdominal pain, constipation, diarrhea, nausea and vomiting.  Genitourinary: Negative for dysuria, frequency and urgency.  Musculoskeletal: Negative for back pain and falls.  Skin: Negative.  Negative for rash.  Neurological: Positive for weakness. Negative for headaches.  Endo/Heme/Allergies: Negative.  Does not bruise/bleed easily.  Psychiatric/Behavioral: Negative.  Negative for depression. The patient is not nervous/anxious and does not have insomnia.      Allergies  Allergen Reactions  . Sulfa Antibiotics Other (See Comments)  . Sulfacetamide Sodium     Other reaction(s): Other (See Comments)     Past Medical History:  Diagnosis Date  . Anxiety   . Fibromyalgia   . GERD (gastroesophageal reflux disease)   . History of echocardiogram    a. 02/2016 Echo: EF 60-65%, no rwma. Nl RV fxn and PASP.  Marland Kitchen HTN (hypertension)   . Hypothyroidism   . Mixed hyperlipidemia   . Osteoarthritis    a. bilateral knees  . PAF (paroxysmal atrial fibrillation) (HCC)    a. 02/2015 & 09/2017 - admit w/ Afib, briefly req Amio; b. CHA2DSVASc = 4-->Eliquis.  . Recurrent UTI (urinary tract infection)   . Sinus bradycardia    a. Has limited beta blocker dosing.     Past Surgical History:  Procedure Laterality Date  .  ABDOMINAL HYSTERECTOMY    . CHOLECYSTECTOMY    . ROTATOR CUFF REPAIR     left     Social History   Socioeconomic History  . Marital status: Married    Spouse name: Not on file  . Number of children: Not on file  . Years of  education: Not on file  . Highest education level: Bachelor's degree (e.g., BA, AB, BS)  Occupational History  . Not on file  Tobacco Use  . Smoking status: Never Smoker  . Smokeless tobacco: Never Used  Vaping Use  . Vaping Use: Never used  Substance and Sexual Activity  . Alcohol use: Yes    Alcohol/week: 7.0 standard drinks    Types: 7 Glasses of wine per week    Comment: glass of wine daily  . Drug use: No  . Sexual activity: Not Currently    Birth control/protection: Post-menopausal  Other Topics Concern  . Not on file  Social History Narrative  . Not on file   Social Determinants of Health   Financial Resource Strain: Not on file  Food Insecurity: Not on file  Transportation Needs: Not on file  Physical Activity: Not on file  Stress: Not on file  Social Connections: Not on file  Intimate Partner Violence: Not on file    Family History  Problem Relation Age of Onset  . Heart disease Mother 51       CABG  . Heart attack Maternal Grandfather   . Heart attack Maternal Grandmother      Current Outpatient Medications:  .  atorvastatin (LIPITOR) 80 MG tablet, Take 1 tablet (80 mg total) by mouth daily at 6 PM., Disp: 90 tablet, Rfl: 2 .  Cholecalciferol 25 MCG (1000 UT) tablet, Take 1,000 Units by mouth daily. , Disp: , Rfl:  .  diclofenac Sodium (QC DICLOFENAC SODIUM) 1 % GEL, Apply 2 g topically 3 (three) times daily as needed. For arthritis of hands, elbow, Disp: 100 g, Rfl: 2 .  ELIQUIS 5 MG TABS tablet, TAKE 1 TABLET TWICE DAILY, Disp: 180 tablet, Rfl: 1 .  FLUoxetine (PROZAC) 40 MG capsule, TAKE 1 CAPSULE EVERY DAY, Disp: 90 capsule, Rfl: 1 .  gabapentin (NEURONTIN) 300 MG capsule, Take 1 capsule (300mg ) in morning, take 1 cap (300mg ) in afternoon, and take 2 caps (600mg ) in evening., Disp: 30 capsule, Rfl: 0 .  hydrALAZINE (APRESOLINE) 25 MG tablet, TAKE 1 TABLET TWICE DAILY, Disp: 180 tablet, Rfl: 2 .  levothyroxine (SYNTHROID) 112 MCG tablet, Take 1 tablet  (112 mcg total) by mouth daily., Disp: 90 tablet, Rfl: 1 .  metoprolol tartrate (LOPRESSOR) 25 MG tablet, TAKE 1/2 TABLET TWICE A DAY. CONTACT OFFICE FOR APPOINTMENT [phone], Disp: 90 tablet, Rfl: 3 .  nystatin (MYCOSTATIN/NYSTOP) powder, Apply 1 application topically 3 (three) times daily as needed. For 2 to 4 weeks as needed, Disp: 45 g, Rfl: 0 .  omeprazole (PRILOSEC) 40 MG capsule, Take 1 capsule (40 mg total) by mouth daily as needed., Disp: 90 capsule, Rfl: 1  Physical exam:  Vitals:   03/07/21 1111  Weight: 153 lb 14.4 oz (69.8 kg)   Physical Exam Constitutional:      Appearance: Normal appearance.  HENT:     Head: Normocephalic and atraumatic.  Eyes:     Pupils: Pupils are equal, round, and reactive to light.  Cardiovascular:     Rate and Rhythm: Normal rate and regular rhythm.     Heart sounds: Normal heart sounds. No murmur heard.  Pulmonary:     Effort: Pulmonary effort is normal.     Breath sounds: Normal breath sounds. No wheezing.  Abdominal:     General: Bowel sounds are normal. There is no distension.     Palpations: Abdomen is soft.     Tenderness: There is no abdominal tenderness.  Musculoskeletal:        General: Normal range of motion.     Cervical back: Normal range of motion.  Skin:    General: Skin is warm and dry.     Findings: No rash.  Neurological:     Mental Status: She is alert and oriented to person, place, and time.  Psychiatric:        Judgment: Judgment normal.      CMP Latest Ref Rng & Units 01/08/2021  Glucose 65 - 99 mg/dL 97  BUN 7 - 25 mg/dL 12  Creatinine 1.61 - 0.96 mg/dL 0.45(W)  Sodium 098 - 119 mmol/L 138  Potassium 3.5 - 5.3 mmol/L 4.4  Chloride 98 - 110 mmol/L 103  CO2 20 - 32 mmol/L 25  Calcium 8.6 - 10.4 mg/dL 9.3  Total Protein 6.1 - 8.1 g/dL 6.7  Total Bilirubin 0.2 - 1.2 mg/dL 0.5  Alkaline Phos 25 - 125 -  AST 10 - 35 U/L 18  ALT 6 - 29 U/L 14   CBC Latest Ref Rng & Units 01/08/2021  WBC 3.8 - 10.8  Thousand/uL 8.0  Hemoglobin 11.7 - 15.5 g/dL 14.7  Hematocrit 82.9 - 45.0 % 40.9  Platelets 140 - 400 Thousand/uL 317    No images are attached to the encounter.  DG Chest 2 View  Result Date: 02/24/2021 CLINICAL DATA:  Chronic cough. Reported recent outside lumbar spine radiographs demonstrating possible abnormality or artifact in the right lung. Neither the outside study or its report are available. EXAM: CHEST - 2 VIEW COMPARISON:  Chest radiographs 10/15/2017, 03/13/2016 and 03/28/2015. FINDINGS: The heart size and mediastinal contours are stable. There is mild aortic atherosclerosis. There is a questionable new ill-defined nodular density measuring 1.2 cm, projecting over the posterior aspect of the right 6th rib on the PA view. The lungs otherwise appear clear. There are multiple healed or healing posterior left rib fractures. There are degenerative changes throughout the spine associated with a mild scoliosis. IMPRESSION: 1. No acute chest findings. 2. Possible small right lung nodule versus overlap of osseous and vascular structures. Consider chest CT without contrast for further evaluation or follow-up chest radiographs in 1 month. 3. Healing left-sided rib fractures. Electronically Signed   By: Carey Bullocks M.D.   On: 02/24/2021 13:59   CT Chest Wo Contrast  Result Date: 03/05/2021 CLINICAL DATA:  Pulmonary nodule. EXAM: CT CHEST WITHOUT CONTRAST TECHNIQUE: Multidetector CT imaging of the chest was performed following the standard protocol without IV contrast. COMPARISON:  Chest x-ray 02/24/2021 FINDINGS: Cardiovascular: The heart size is normal. No substantial pericardial effusion. Coronary artery calcification is evident. Atherosclerotic calcification is noted in the wall of the thoracic aorta. Mediastinum/Nodes: No mediastinal lymphadenopathy. No evidence for gross hilar lymphadenopathy although assessment is limited by the lack of intravenous contrast on today's study. The esophagus has  normal imaging features. There is no axillary lymphadenopathy. Lungs/Pleura: Trace biapical pleuroparenchymal scarring evident. No suspicious nodule or mass in the right lung as suggested on recent chest x-ray. Tiny 4 mm ground-glass nodule identified posterior left lower lobe on image 71/3 in a central 4 mm left upper lobe nodule is visible on 66/3. Tiny  focus of platelike atelectasis or scarring noted anterior left upper lobe on 61/3. No focal airspace consolidation. No pleural effusion. Upper Abdomen: 3 mm nonobstructing stone noted upper pole left kidney Musculoskeletal: No worrisome lytic or sclerotic osseous abnormality. IMPRESSION: 1. No suspicious right pulmonary nodule as question on recent chest x-ray. 2. Tiny left pulmonary nodules measuring up to maximum size of 4 mm. No follow-up needed if patient is low-risk (and has no known or suspected primary neoplasm). Non-contrast chest CT can be considered in 12 months if patient is high-risk. This recommendation follows the consensus statement: Guidelines for Management of Incidental Pulmonary Nodules Detected on CT Images: From the Fleischner Society 2017; Radiology 2017; 284:228-243. 3.  Aortic Atherosclerois (ICD10-170.0) Electronically Signed   By: Kennith CenterEric  Mansell M.D.   On: 03/05/2021 15:09     Assessment and plan- Patient is a 87 y.o. female who presents to pulmonary nodule clinic for follow-up of incidental lung nodules.    CT chest without contrast from 03/05/2021 shows no suspicious right pulmonary nodule as seen on chest x-ray.  There are tiny left pulmonary nodules measuring up to 4 mm in size.  No follow-up is needed if patient is low risk.  Noncontrast chest CT can be risk considered in 12 months if patient is high risk.   Calculating malignancy probability of a pulmonary nodule: Risk factors include: 1.  Age. 2.  Cancer history. 3.  Diameter of pulmonary nodule and mm 4.  Location 5.  Smoking history 6.  Spiculation present   Based on  risk factors, this patient is low risk for the development of lung cancer.  No additional follow-up is needed.   During our visit, we discussed pulmonary nodules are a common incidental finding and are often how lung cancer is discovered.  Lung cancer survival is directly related to the stage at diagnosis.  We discussed that nodules can vary in presentation from solitary pulmonary nodules to masses, 2 groundglass opacities and multiple nodules.  Pulmonary nodules in the majority of cases are benign but the probability of these becoming malignant cannot be undermined.  Early identification of malignant nodules could lead to early diagnosis and increased survival.   We discussed the probability of pulmonary nodules becoming malignant increase with age, pack years of tobacco use, size/characteristics of the nodule and location; with upper lobe involvement being most worrisome.   We discussed the goal of our clinic is to thoroughly evaluate each nodule, developed a comprehensive, individualized plan of care utilizing the most advanced technology and significantly reduce the time from detection to treatment.  A dedicated pulmonary nodule clinic has proven to indeed expedite the detection and treatment of lung cancer.   Patient education in fact sheet provided along with most recent CT scans.  Plan: -Discussed recent CT scan -Discussed risk factors for the development of lung cancer. -She is low risk given smoking history and size and location of lung nodule.  No additional follow-up is needed at this time. -No personal or family history of cancer -She does not meet criteria for our low-dose CT screening program.  Disposition: No follow-up needed.   Visit Diagnosis 1. Abnormal chest x-ray   2. Lung nodule < 6cm on CT     Patient expressed understanding and was in agreement with this plan. She also understands that She can call clinic at any time with any questions, concerns, or complaints.    Greater than 50% was spent in counseling and coordination of care with this patient including  but not limited to discussion of the relevant topics above (See A&P) including, but not limited to diagnosis and management of acute and chronic medical conditions.   Thank you for allowing me to participate in the care of this very pleasant patient.    Mauro Kaufmann, NP [facility] at [facility] Cell - [phone] Pager- [phone] 03/07/2021 1:15 PM

## 2021-03-18 ENCOUNTER — Ambulatory Visit (INDEPENDENT_AMBULATORY_CARE_PROVIDER_SITE_OTHER): Payer: Medicare PPO

## 2021-03-18 VITALS — Ht 63.0 in | Wt 152.0 lb

## 2021-03-18 DIAGNOSIS — Z0000 Encounter for general adult medical examination without abnormal findings: Secondary | ICD-10-CM

## 2021-03-18 NOTE — Patient Instructions (Signed)
Stacey Duncan , Thank you for taking time to come for your Medicare Wellness Visit. I appreciate your ongoing commitment to your health goals. Please review the following plan we discussed and let me know if I can assist you in the future.   Screening recommendations/referrals: Colonoscopy: not required Mammogram: not required Bone Density: completed 09/28/1999 Recommended yearly ophthalmology/optometry visit for glaucoma screening and checkup Recommended yearly dental visit for hygiene and checkup  Vaccinations: Influenza vaccine: completed 09/25/2020, due 07/28/2021 Pneumococcal vaccine: completed 10/23/2014 Tdap vaccine: completed 02/08/2015 Shingles vaccine: discussed   Covid-19: 12/11/2020, 01/25/2020, 01/04/2020  Advanced directives: Advance directive discussed with you today.   Conditions/risks identified: none  Next appointment: Follow up in one year for your annual wellness visit    Preventive Care 65 Years and Older, Female Preventive care refers to lifestyle choices and visits with your health care provider that can promote health and wellness. What does preventive care include?  A yearly physical exam. This is also called an annual well check.  Dental exams once or twice a year.  Routine eye exams. Ask your health care provider how often you should have your eyes checked.  Personal lifestyle choices, including:  Daily care of your teeth and gums.  Regular physical activity.  Eating a healthy diet.  Avoiding tobacco and drug use.  Limiting alcohol use.  Practicing safe sex.  Taking low-dose aspirin every day.  Taking vitamin and mineral supplements as recommended by your health care provider. What happens during an annual well check? The services and screenings done by your health care provider during your annual well check will depend on your age, overall health, lifestyle risk factors, and family history of disease. Counseling  Your health care provider may ask  you questions about your:  Alcohol use.  Tobacco use.  Drug use.  Emotional well-being.  Home and relationship well-being.  Sexual activity.  Eating habits.  History of falls.  Memory and ability to understand (cognition).  Work and work Astronomer.  Reproductive health. Screening  You may have the following tests or measurements:  Height, weight, and BMI.  Blood pressure.  Lipid and cholesterol levels. These may be checked every 5 years, or more frequently if you are over 85 years old.  Skin check.  Lung cancer screening. You may have this screening every year starting at age 43 if you have a 30-pack-year history of smoking and currently smoke or have quit within the past 15 years.  Fecal occult blood test (FOBT) of the stool. You may have this test every year starting at age 59.  Flexible sigmoidoscopy or colonoscopy. You may have a sigmoidoscopy every 5 years or a colonoscopy every 10 years starting at age 81.  Hepatitis C blood test.  Hepatitis B blood test.  Sexually transmitted disease (STD) testing.  Diabetes screening. This is done by checking your blood sugar (glucose) after you have not eaten for a while (fasting). You may have this done every 1-3 years.  Bone density scan. This is done to screen for osteoporosis. You may have this done starting at age 64.  Mammogram. This may be done every 1-2 years. Talk to your health care provider about how often you should have regular mammograms. Talk with your health care provider about your test results, treatment options, and if necessary, the need for more tests. Vaccines  Your health care provider may recommend certain vaccines, such as:  Influenza vaccine. This is recommended every year.  Tetanus, diphtheria, and acellular pertussis (Tdap,  Td) vaccine. You may need a Td booster every 10 years.  Zoster vaccine. You may need this after age 63.  Pneumococcal 13-valent conjugate (PCV13) vaccine. One dose  is recommended after age 57.  Pneumococcal polysaccharide (PPSV23) vaccine. One dose is recommended after age 44. Talk to your health care provider about which screenings and vaccines you need and how often you need them. This information is not intended to replace advice given to you by your health care provider. Make sure you discuss any questions you have with your health care provider. Document Released: 01/10/2016 Document Revised: 09/02/2016 Document Reviewed: 10/15/2015 Elsevier Interactive Patient Education  2017 [place] Prevention in the Home Falls can cause injuries. They can happen to people of all ages. There are many things you can do to make your home safe and to help prevent falls. What can I do on the outside of my home?  Regularly fix the edges of walkways and driveways and fix any cracks.  Remove anything that might make you trip as you walk through a door, such as a raised step or threshold.  Trim any bushes or trees on the path to your home.  Use bright outdoor lighting.  Clear any walking paths of anything that might make someone trip, such as rocks or tools.  Regularly check to see if handrails are loose or broken. Make sure that both sides of any steps have handrails.  Any raised decks and porches should have guardrails on the edges.  Have any leaves, snow, or ice cleared regularly.  Use sand or salt on walking paths during winter.  Clean up any spills in your garage right away. This includes oil or grease spills. What can I do in the bathroom?  Use night lights.  Install grab bars by the toilet and in the tub and shower. Do not use towel bars as grab bars.  Use non-skid mats or decals in the tub or shower.  If you need to sit down in the shower, use a plastic, non-slip stool.  Keep the floor dry. Clean up any water that spills on the floor as soon as it happens.  Remove soap buildup in the tub or shower regularly.  Attach bath mats  securely with double-sided non-slip rug tape.  Do not have throw rugs and other things on the floor that can make you trip. What can I do in the bedroom?  Use night lights.  Make sure that you have a light by your bed that is easy to reach.  Do not use any sheets or blankets that are too big for your bed. They should not hang down onto the floor.  Have a firm chair that has side arms. You can use this for support while you get dressed.  Do not have throw rugs and other things on the floor that can make you trip. What can I do in the kitchen?  Clean up any spills right away.  Avoid walking on wet floors.  Keep items that you use a lot in easy-to-reach places.  If you need to reach something above you, use a strong step stool that has a grab bar.  Keep electrical cords out of the way.  Do not use floor polish or wax that makes floors slippery. If you must use wax, use non-skid floor wax.  Do not have throw rugs and other things on the floor that can make you trip. What can I do with my stairs?  Do not  leave any items on the stairs.  Make sure that there are handrails on both sides of the stairs and use them. Fix handrails that are broken or loose. Make sure that handrails are as long as the stairways.  Check any carpeting to make sure that it is firmly attached to the stairs. Fix any carpet that is loose or worn.  Avoid having throw rugs at the top or bottom of the stairs. If you do have throw rugs, attach them to the floor with carpet tape.  Make sure that you have a light switch at the top of the stairs and the bottom of the stairs. If you do not have them, ask someone to add them for you. What else can I do to help prevent falls?  Wear shoes that:  Do not have high heels.  Have rubber bottoms.  Are comfortable and fit you well.  Are closed at the toe. Do not wear sandals.  If you use a stepladder:  Make sure that it is fully opened. Do not climb a closed  stepladder.  Make sure that both sides of the stepladder are locked into place.  Ask someone to hold it for you, if possible.  Clearly mark and make sure that you can see:  Any grab bars or handrails.  First and last steps.  Where the edge of each step is.  Use tools that help you move around (mobility aids) if they are needed. These include:  Canes.  Walkers.  Scooters.  Crutches.  Turn on the lights when you go into a dark area. Replace any light bulbs as soon as they burn out.  Set up your furniture so you have a clear path. Avoid moving your furniture around.  If any of your floors are uneven, fix them.  If there are any pets around you, be aware of where they are.  Review your medicines with your doctor. Some medicines can make you feel dizzy. This can increase your chance of falling. Ask your doctor what other things that you can do to help prevent falls. This information is not intended to replace advice given to you by your health care provider. Make sure you discuss any questions you have with your health care provider. Document Released: 10/10/2009 Document Revised: 05/21/2016 Document Reviewed: 01/18/2015 Elsevier Interactive Patient Education  2017 [employer].

## 2021-03-18 NOTE — Progress Notes (Signed)
I connected with Stacey Duncan today by telephone and verified that I am speaking with the correct person using two identifiers. Location patient: home Location provider: work Persons participating in the virtual visit: [place], [place] LPN.   I discussed the limitations, risks, security and privacy concerns of performing an evaluation and management service by telephone and the availability of in person appointments. I also discussed with the patient that there may be a patient responsible charge related to this service. The patient expressed understanding and verbally consented to this telephonic visit.    Interactive audio and video telecommunications were attempted between this provider and patient, however failed, due to patient having technical difficulties OR patient did not have access to video capability.  We continued and completed visit with audio only.     Vital signs may be patient reported or missing.  Subjective:   Stacey Duncan is a 87 y.o. female who presents for Medicare Annual (Subsequent) preventive examination.  Review of Systems     Cardiac Risk Factors include: advanced age (>30men, >63 women);dyslipidemia;hypertension;sedentary lifestyle     Objective:    Today's Vitals   03/18/21 1441  Weight: 152 lb (68.9 kg)  Height: 5\' 3"  (1.6 m)   Body mass index is 26.93 kg/m.  Advanced Directives 03/18/2021 08/08/2019 07/12/2018 05/27/2018 10/15/2017 10/15/2017 07/20/2017  Does Patient Have a Medical Advance Directive? No No No No No No No  Would patient like information on creating a medical advance directive? - - Yes (MAU/Ambulatory/Procedural Areas - Information given) - Yes (Inpatient - patient requests chaplain consult to create a medical advance directive) - Yes (MAU/Ambulatory/Procedural Areas - Information given)    Current Medications (verified) Outpatient Encounter Medications as of 03/18/2021  Medication Sig  . atorvastatin (LIPITOR) 80  MG tablet Take 1 tablet (80 mg total) by mouth daily at 6 PM.  . Cholecalciferol 25 MCG (1000 UT) tablet Take 1,000 Units by mouth daily.   03/20/2021 ELIQUIS 5 MG TABS tablet TAKE 1 TABLET TWICE DAILY  . FLUoxetine (PROZAC) 40 MG capsule TAKE 1 CAPSULE EVERY DAY  . gabapentin (NEURONTIN) 300 MG capsule Take 1 capsule (300mg ) in morning, take 1 cap (300mg ) in afternoon, and take 2 caps (600mg ) in evening.  . hydrALAZINE (APRESOLINE) 25 MG tablet TAKE 1 TABLET TWICE DAILY  . levothyroxine (SYNTHROID) 112 MCG tablet Take 1 tablet (112 mcg total) by mouth daily.  . metoprolol tartrate (LOPRESSOR) 25 MG tablet TAKE 1/2 TABLET TWICE A DAY. CONTACT OFFICE FOR APPOINTMENT [phone]  . nystatin (MYCOSTATIN/NYSTOP) powder Apply 1 application topically 3 (three) times daily as needed. For 2 to 4 weeks as needed  . omeprazole (PRILOSEC) 40 MG capsule Take 1 capsule (40 mg total) by mouth daily as needed.  . diclofenac Sodium (QC DICLOFENAC SODIUM) 1 % GEL Apply 2 g topically 3 (three) times daily as needed. For arthritis of hands, elbow (Patient not taking: Reported on 03/18/2021)   No facility-administered encounter medications on file as of 03/18/2021.    Allergies (verified) Sulfa antibiotics and Sulfacetamide sodium   History: Past Medical History:  Diagnosis Date  . Anxiety   . Fibromyalgia   . GERD (gastroesophageal reflux disease)   . History of echocardiogram    a. 02/2016 Echo: EF 60-65%, no rwma. Nl RV fxn and PASP.  [phone] HTN (hypertension)   . Hypothyroidism   . Mixed hyperlipidemia   . Osteoarthritis    a. bilateral knees  . PAF (paroxysmal atrial fibrillation) (HCC)  a. 02/2015 & 09/2017 - admit w/ Afib, briefly req Amio; b. CHA2DSVASc = 4-->Eliquis.  . Recurrent UTI (urinary tract infection)   . Sinus bradycardia    a. Has limited beta blocker dosing.   Past Surgical History:  Procedure Laterality Date  . ABDOMINAL HYSTERECTOMY    . CHOLECYSTECTOMY    . ROTATOR CUFF REPAIR     left     Family History  Problem Relation Age of Onset  . Heart disease Mother 30       CABG  . Heart attack Maternal Grandfather   . Heart attack Maternal Grandmother    Social History   Socioeconomic History  . Marital status: Married    Spouse name: Not on file  . Number of children: Not on file  . Years of education: Not on file  . Highest education level: Bachelor's degree (e.g., BA, AB, BS)  Occupational History  . Occupation: retired  Tobacco Use  . Smoking status: Never Smoker  . Smokeless tobacco: Never Used  Vaping Use  . Vaping Use: Never used  Substance and Sexual Activity  . Alcohol use: Yes    Alcohol/week: 7.0 standard drinks    Types: 7 Glasses of wine per week    Comment: glass of wine daily  . Drug use: No  . Sexual activity: Not Currently    Birth control/protection: Post-menopausal  Other Topics Concern  . Not on file  Social History Narrative  . Not on file   Social Determinants of Health   Financial Resource Strain: Low Risk   . Difficulty of Paying Living Expenses: Not hard at all  Food Insecurity: No Food Insecurity  . Worried About Programme researcher, broadcasting/film/video in the Last Year: Never true  . Ran Out of Food in the Last Year: Never true  Transportation Needs: No Transportation Needs  . Lack of Transportation (Medical): No  . Lack of Transportation (Non-Medical): No  Physical Activity: Inactive  . Days of Exercise per Week: 0 days  . Minutes of Exercise per Session: 0 min  Stress: Stress Concern Present  . Feeling of Stress : To some extent  Social Connections: Not on file    Tobacco Counseling Counseling given: Not Answered   Clinical Intake:  Pre-visit preparation completed: Yes  Pain : No/denies pain     Nutritional Status: BMI 25 -29 Overweight Nutritional Risks: None Diabetes: No  How often do you need to have someone help you when you read instructions, pamphlets, or other written materials from your doctor or pharmacy?: 1 -  Never What is the last grade level you completed in school?: college  Diabetic? no  Interpreter Needed?: No  Information entered by :: NAllen LPN   Activities of Daily Living In your present state of health, do you have any difficulty performing the following activities: 03/18/2021 09/24/2020  Hearing? N N  Vision? N N  Difficulty concentrating or making decisions? N N  Walking or climbing stairs? Y N  Dressing or bathing? N N  Doing errands, shopping? N N  Preparing Food and eating ? N -  Using the Toilet? N -  In the past six months, have you accidently leaked urine? Y -  Comment wears pads -  Do you have problems with loss of bowel control? Y -  Managing your Medications? N -  Managing your Finances? N -  Housekeeping or managing your Housekeeping? N -  Some recent data might be hidden    Patient Care Team:  Karamalegos, Netta NeatAlexander J, DO as PCP - General (Family Medicine) Mariah MillingGollan, Tollie Pizzaimothy J, MD as PCP - Cardiology (Cardiology) Antonieta IbaGollan, Timothy J, MD as Consulting Physician (Cardiology) Riki AltesStoioff, Scott C, MD (Urology) Juanell FairlyKrasinski, Kevin, MD as Referring Physician (Orthopedic Surgery)  Indicate any recent Medical Services you may have received from other than [facility] providers in the past year (date may be approximate).     Assessment:   This is a routine wellness examination for Elease Hashimotoatricia.  Hearing/Vision screen No exam data present  Dietary issues and exercise activities discussed: Current Exercise Habits: The patient does not participate in regular exercise at present  Goals    . DIET - INCREASE WATER INTAKE     Recommend drinking at least 6-8 glasses of water a day     . Increase water intake     Recommend continue drinking at least 5-6 glasses of water a day     . Patient Stated     03/18/2021, no goals      Depression Screen PHQ 2/9 Scores 03/18/2021 01/22/2021 09/24/2020 02/27/2020 08/09/2019 08/08/2019 12/02/2018  PHQ - 2 Score 1 3 3 1  0 1 0  PHQ- 9 Score 4 9 9 4 3  -  0    Fall Risk Fall Risk  03/18/2021 09/24/2020 02/27/2020 08/09/2019 08/08/2019  Falls in the past year? 0 0 0 0 0  Number falls in past yr: - 0 0 - -  Injury with Fall? - 0 0 - -  Risk Factor Category  - - - - -  Risk for fall due to : Medication side effect - - - -  Follow up Falls evaluation completed;Education provided;Falls prevention discussed Falls evaluation completed Falls evaluation completed Falls evaluation completed -    FALL RISK PREVENTION PERTAINING TO THE HOME:  Any stairs in or around the home? Yes  If so, are there any without handrails? No  Home free of loose throw rugs in walkways, pet beds, electrical cords, etc? Yes  Adequate lighting in your home to reduce risk of falls? Yes   ASSISTIVE DEVICES UTILIZED TO PREVENT FALLS:  Life alert? No  Use of a cane, walker or w/c? Yes  Grab bars in the bathroom? No  Shower chair or bench in shower? No  Elevated toilet seat or a handicapped toilet? Yes   TIMED UP AND GO:  Was the test performed? No .    Cognitive Function:     6CIT Screen 03/18/2021 07/12/2018 07/20/2017  What Year? 0 points 0 points 0 points  What month? 0 points 0 points 0 points  What time? 0 points 0 points 0 points  Count back from 20 0 points 0 points 0 points  Months in reverse 0 points 0 points 0 points  Repeat phrase 4 points 0 points 0 points  Total Score 4 0 0    Immunizations Immunization History  Administered Date(s) Administered  . Fluad Quad(high Dose 65+) 10/24/2019, 09/25/2020  . Influenza, High Dose Seasonal PF 09/30/2015, 10/22/2016, 10/20/2017, 10/26/2018  . PFIZER(Purple Top)SARS-COV-2 Vaccination 01/04/2020, 01/25/2020    TDAP status: Up to date  Flu Vaccine status: Up to date  Pneumococcal vaccine status: Up to date  Covid-19 vaccine status: Completed vaccines  Qualifies for Shingles Vaccine? Yes   Zostavax completed No   Shingrix Completed?: No.    Education has been provided regarding the importance of this  vaccine. Patient has been advised to call insurance company to determine out of pocket expense if they have not yet received this  vaccine. Advised may also receive vaccine at local pharmacy or Health Dept. Verbalized acceptance and understanding.  Screening Tests Health Maintenance  Topic Date Due  . COVID-19 Vaccine (3 - Booster for Pfizer series) 07/24/2020  . TETANUS/TDAP  02/08/2025  . INFLUENZA VACCINE  Completed  . DEXA SCAN  Completed  . PNA vac Low Risk Adult  Completed  . HPV VACCINES  Aged Out    Health Maintenance  Health Maintenance Due  Topic Date Due  . COVID-19 Vaccine (3 - Booster for Pfizer series) 07/24/2020    Colorectal cancer screening: No longer required.   Mammogram status: No longer required due to age.  Bone Density status: Completed 09/28/1999.   Lung Cancer Screening: (Low Dose CT Chest recommended if Age 40-80 years, 30 pack-year currently smoking OR have quit w/in 15years.) does not qualify.   Lung Cancer Screening Referral: no  Additional Screening:  Hepatitis C Screening: does not qualify;   Vision Screening: Recommended annual ophthalmology exams for early detection of glaucoma and other disorders of the eye. Is the patient up to date with their annual eye exam?  No  Who is the provider or what is the name of the office in which the patient attends annual eye exams? none If pt is not established with a provider, would they like to be referred to a provider to establish care? No .   Dental Screening: Recommended annual dental exams for proper oral hygiene  Community Resource Referral / Chronic Care Management: CRR required this visit?  No   CCM required this visit?  No      Plan:     I have personally reviewed and noted the following in the patient's chart:   . Medical and social history . Use of alcohol, tobacco or illicit drugs  . Current medications and supplements . Functional ability and status . Nutritional  status . Physical activity . Advanced directives . List of other physicians . Hospitalizations, surgeries, and ER visits in previous 12 months . Vitals . Screenings to include cognitive, depression, and falls . Referrals and appointments  In addition, I have reviewed and discussed with patient certain preventive protocols, quality metrics, and best practice recommendations. A written personalized care plan for preventive services as well as general preventive health recommendations were provided to patient.     Barb Merino, LPN   [id number]/[id number]   Nurse Notes:

## 2021-03-25 ENCOUNTER — Other Ambulatory Visit: Payer: Self-pay

## 2021-03-25 ENCOUNTER — Other Ambulatory Visit: Payer: Self-pay | Admitting: Family Medicine

## 2021-03-25 DIAGNOSIS — F411 Generalized anxiety disorder: Secondary | ICD-10-CM

## 2021-03-25 DIAGNOSIS — F339 Major depressive disorder, recurrent, unspecified: Secondary | ICD-10-CM

## 2021-03-25 MED ORDER — FLUOXETINE HCL 40 MG PO CAPS: 40.0000 mg | ORAL_CAPSULE | Freq: Every day | ORAL | 0 refills | Status: DC

## 2021-03-25 MED ORDER — FLUOXETINE HCL 40 MG PO CAPS: 40.0000 mg | ORAL_CAPSULE | Freq: Every day | ORAL | 1 refills | Status: DC

## 2021-03-25 NOTE — Telephone Encounter (Signed)
Patient would like to have a 30 day supply sent to [facility] and then regular supply sent to [facility]. Patient has been using her son medication. I show that this script is expired. Review for refill

## 2021-03-25 NOTE — Addendum Note (Signed)
Addended by: Smitty Cords on: 03/25/2021 06:37 PM   Modules accepted: Orders

## 2021-03-26 DIAGNOSIS — Z85828 Personal history of other malignant neoplasm of skin: Secondary | ICD-10-CM | POA: Diagnosis not present

## 2021-03-26 DIAGNOSIS — B372 Candidiasis of skin and nail: Secondary | ICD-10-CM | POA: Diagnosis not present

## 2021-06-04 ENCOUNTER — Other Ambulatory Visit: Payer: Self-pay | Admitting: Family

## 2021-06-16 ENCOUNTER — Other Ambulatory Visit: Payer: Self-pay | Admitting: Family

## 2021-06-16 NOTE — Telephone Encounter (Signed)
Pt's age 87, wt 68.9 kg, SCr 0.91, CrCl 55.73, last ov w/ CW 11/29/20.

## 2021-06-16 NOTE — Telephone Encounter (Signed)
Refill Request.  

## 2021-06-18 ENCOUNTER — Other Ambulatory Visit: Payer: Self-pay | Admitting: Family Medicine

## 2021-06-18 DIAGNOSIS — E032 Hypothyroidism due to medicaments and other exogenous substances: Secondary | ICD-10-CM

## 2021-06-18 NOTE — Telephone Encounter (Signed)
Requested medications are due for refill today.  yes  Requested medications are on the active medications list.  yes  Last refill. 01/22/2021  Future visit scheduled.   yes  Notes to clinic.  Labs are needed.

## 2021-06-24 ENCOUNTER — Encounter: Payer: Self-pay | Admitting: Family Medicine

## 2021-06-24 ENCOUNTER — Ambulatory Visit: Payer: Medicare PPO | Admitting: Family Medicine

## 2021-06-24 ENCOUNTER — Ambulatory Visit: Payer: Self-pay

## 2021-06-24 ENCOUNTER — Other Ambulatory Visit: Payer: Self-pay

## 2021-06-24 VITALS — BP 113/31 | HR 61 | Ht 63.0 in | Wt 153.4 lb

## 2021-06-24 DIAGNOSIS — Z23 Encounter for immunization: Secondary | ICD-10-CM | POA: Diagnosis not present

## 2021-06-24 DIAGNOSIS — M542 Cervicalgia: Secondary | ICD-10-CM

## 2021-06-24 DIAGNOSIS — S46812A Strain of other muscles, fascia and tendons at shoulder and upper arm level, left arm, initial encounter: Secondary | ICD-10-CM

## 2021-06-24 MED ORDER — SHINGRIX 50 MCG/0.5ML IM SUSR: INTRAMUSCULAR | 1 refills | Status: DC

## 2021-06-24 NOTE — Patient Instructions (Addendum)
Thank you for coming to the office today.  Likely a muscle strain with trapezius muscle there. I do not think it is the cervical spine.  Use alternating ice and heat. Prefer heat earlier on and when trying to get going and when stiff and sore, use ice later in day when winding down.  Look into [employer] as an option  If you need more tizanidine let us know and I can order more.  Keep on the right track  Biofreeze can help.   Please schedule a Follow-up Appointment to: Return if symptoms worsen or fail to improve, for keep apt 06/2021.  If you have any other questions or concerns, please feel free to call the office or send a message through MyChart. You may also schedule an earlier appointment if necessary.  Additionally, you may be receiving a survey about your experience at our office within a few days to 1 week by e-mail or mail. We value your feedback.  Stacey Pilar, DO [facility], [facility]  Cervical Strain and Sprain Rehab Ask your health care provider which exercises are safe for you. Do exercises exactly as told by your health care provider and adjust them as directed. It is normal to feel mild stretching, pulling, tightness, or discomfort as you do these exercises. Stop right away if you feel sudden pain or your pain gets worse. Do not begin these exercises until told by your health care provider. Stretching and range-of-motion exercises Cervical side bending  Using good posture, sit on a stable chair or stand up. Without moving your shoulders, slowly tilt your left / right ear to your shoulder until you feel a stretch in the opposite side neck muscles. You should be looking straight ahead. Hold for __________ seconds. Repeat with the other side of your neck. Repeat __________ times. Complete this exercise __________ times a day. Cervical rotation  Using good posture, sit on a stable chair or stand up. Slowly turn your head to the side as if  you are looking over your left / right shoulder. Keep your eyes level with the ground. Stop when you feel a stretch along the side and the back of your neck. Hold for __________ seconds. Repeat this by turning to your other side. Repeat __________ times. Complete this exercise __________ times a day. Thoracic extension and pectoral stretch Roll a towel or a small blanket so it is about 4 inches (10 cm) in diameter. Lie down on your back on a firm surface. Put the towel lengthwise, under your spine in the middle of your back. It should not be under your shoulder blades. The towel should line up with your spine from your middle back to your lower back. Put your hands behind your head and let your elbows fall out to your sides. Hold for __________ seconds. Repeat __________ times. Complete this exercise __________ times a day. Strengthening exercises Isometric upper cervical flexion Lie on your back with a thin pillow behind your head and a small rolled-up towel under your neck. Gently tuck your chin toward your chest and nod your head down to look toward your feet. Do not lift your head off the pillow. Hold for __________ seconds. Release the tension slowly. Relax your neck muscles completely before you repeat this exercise. Repeat __________ times. Complete this exercise __________ times a day. Isometric cervical extension  Stand about 6 inches (15 cm) away from a wall, with your back facing the wall. Place a soft object, about 6-8  inches (15-20 cm) in diameter, between the back of your head and the wall. A soft object could be a small pillow, a ball, or a folded towel. Gently tilt your head back and press into the soft object. Keep your jaw and forehead relaxed. Hold for __________ seconds. Release the tension slowly. Relax your neck muscles completely before you repeat this exercise. Repeat __________ times. Complete this exercise __________ times a day. Posture and body mechanics Body  mechanics refers to the movements and positions of your body while you do your daily activities. Posture is part of body mechanics. Good posture and healthy body mechanics can help to relieve stress in your body's tissues and joints. Good posture means that your spine is in its natural S-curve position (your spine is neutral), your shoulders are pulled back slightly, and your head is not tipped forward. The following are general guidelines for applying improved posture andbody mechanics to your everyday activities. Sitting  When sitting, keep your spine neutral and keep your feet flat on the floor. Use a footrest, if necessary, and keep your thighs parallel to the floor. Avoid rounding your shoulders, and avoid tilting your head forward. When working at a desk or a computer, keep your desk at a height where your hands are slightly lower than your elbows. Slide your chair under your desk so you are close enough to maintain good posture. When working at a computer, place your monitor at a height where you are looking straight ahead and you do not have to tilt your head forward or downward to look at the screen.  Standing  When standing, keep your spine neutral and keep your feet about hip-width apart. Keep a slight bend in your knees. Your ears, shoulders, and hips should line up. When you do a task in which you stand in one place for a long time, place one foot up on a stable object that is 2-4 inches (5-10 cm) high, such as a footstool. This helps keep your spine neutral.  Resting When lying down and resting, avoid positions that are most painful for you. Try to support your neck in a neutral position. You can use a contour pillow or asmall rolled-up towel. Your pillow should support your neck but not push on it. This information is not intended to replace advice given to you by your health care provider. Make sure you discuss any questions you have with your healthcare provider. Document Revised:  04/05/2019 Document Reviewed: 09/14/2018 Elsevier Patient Education  2022 [employer].

## 2021-06-24 NOTE — Progress Notes (Signed)
Subjective:    Patient ID: Stacey Duncan, female    DOB: 03/05/1931, 87 y.o.   MRN: [id number]  Stacey Duncan is a 87 y.o. female presenting on 06/24/2021 for Neck Pain   HPI  Neck Pain, acute, posterior Reports onset about 8 days ago, with started having some pain midline posterior neck with some radiating pain, no injury or trauma. Says that movement does not bother her neck, actually makes it feel better. - She has tried ice packs PRN some relief - Has had a few days of feeling better - Tried Tylenol, Tizanidine PRN with some relief. She had the Tizanidine left over for back problem. - She no longer has Voltaren.  Health Maintenance:  Due for Shingrix. She had Zostavax.  Depression screen [facility] 2/9 06/24/2021 03/18/2021 01/22/2021  Decreased Interest 1 0 2  Down, Depressed, Hopeless 1 1 1   PHQ - 2 Score 2 1 3   Altered sleeping 1 0 1  Tired, decreased energy 2 3 2   Change in appetite 2 0 3  Feeling bad or failure about yourself  0 0 0  Trouble concentrating 0 0 0  Moving slowly or fidgety/restless 0 0 0  Suicidal thoughts 0 0 0  PHQ-9 Score 7 4 9   Difficult doing work/chores Somewhat difficult Somewhat difficult Somewhat difficult  Some recent data might be hidden    Social History   Tobacco Use   Smoking status: Never   Smokeless tobacco: Never  Vaping Use   Vaping Use: Never used  Substance Use Topics   Alcohol use: Yes    Alcohol/week: 7.0 standard drinks    Types: 7 Glasses of wine per week    Comment: glass of wine daily   Drug use: No    Review of Systems Per HPI unless specifically indicated above     Objective:    BP (!) 113/31   Pulse 61   Ht 5\' 3"  (1.6 m)   Wt 153 lb 6.4 oz (69.6 kg)   SpO2 97%   BMI 27.17 kg/m   Wt Readings from Last 3 Encounters:  06/24/21 153 lb 6.4 oz (69.6 kg)  03/18/21 152 lb (68.9 kg)  03/07/21 153 lb 14.4 oz (69.8 kg)    Physical Exam Vitals and nursing note reviewed.  Constitutional:      General: She  is not in acute distress.    Appearance: Normal appearance. She is well-developed. She is not diaphoretic.     Comments: Well-appearing, comfortable, cooperative  HENT:     Head: Normocephalic and atraumatic.  Eyes:     General:        Right eye: No discharge.        Left eye: No discharge.     Conjunctiva/sclera: Conjunctivae normal.  Neck:     Comments: Non tender over spine Bilateral trapezius muscle spasm hypertonicity, c-spine paraspinal muscles without spasm Cardiovascular:     Rate and Rhythm: Normal rate.  Pulmonary:     Effort: Pulmonary effort is normal.  Skin:    General: Skin is warm and dry.     Findings: No erythema or rash.  Neurological:     Mental Status: She is alert and oriented to person, place, and time.  Psychiatric:        Mood and Affect: Mood normal.        Behavior: Behavior normal.        Thought Content: Thought content normal.     Comments: Well groomed, good eye  contact, normal speech and thoughts   Results for orders placed or performed in visit on 09/24/20  TSH  Result Value Ref Range   TSH 8.72 (H) 0.40 - 4.50 mIU/L  T4, free  Result Value Ref Range   Free T4 1.0 0.8 - 1.8 ng/dL  COMPLETE METABOLIC PANEL WITH GFR  Result Value Ref Range   Glucose, Bld 97 65 - 99 mg/dL   BUN 12 7 - 25 mg/dL   Creat 5.70 (H) 1.77 - 0.88 mg/dL   GFR, Est Non African American 57 (L) > OR = 60 mL/min/1.22m2   GFR, Est African American 66 > OR = 60 mL/min/1.39m2   BUN/Creatinine Ratio 13 6 - 22 (calc)   Sodium 138 135 - 146 mmol/L   Potassium 4.4 3.5 - 5.3 mmol/L   Chloride 103 98 - 110 mmol/L   CO2 25 20 - 32 mmol/L   Calcium 9.3 8.6 - 10.4 mg/dL   Total Protein 6.7 6.1 - 8.1 g/dL   Albumin 4.1 3.6 - 5.1 g/dL   Globulin 2.6 1.9 - 3.7 g/dL (calc)   AG Ratio 1.6 1.0 - 2.5 (calc)   Total Bilirubin 0.5 0.2 - 1.2 mg/dL   Alkaline phosphatase (APISO) 73 37 - 153 U/L   AST 18 10 - 35 U/L   ALT 14 6 - 29 U/L  CBC with Differential/Platelet  Result Value Ref  Range   WBC 8.0 3.8 - 10.8 Thousand/uL   RBC 4.55 3.80 - 5.10 Million/uL   Hemoglobin 13.6 11.7 - 15.5 g/dL   HCT 93.9 03.0 - 09.2 %   MCV 89.9 80.0 - 100.0 fL   MCH 29.9 27.0 - 33.0 pg   MCHC 33.3 32.0 - 36.0 g/dL   RDW 33.0 07.6 - 22.6 %   Platelets 317 140 - 400 Thousand/uL   MPV 10.3 7.5 - 12.5 fL   Neutro Abs 3,616 1,500 - 7,800 cells/uL   Lymphs Abs 3,376 850 - 3,900 cells/uL   Absolute Monocytes 600 200 - 950 cells/uL   Eosinophils Absolute 328 15 - 500 cells/uL   Basophils Absolute 80 0 - 200 cells/uL   Neutrophils Relative % 45.2 %   Total Lymphocyte 42.2 %   Monocytes Relative 7.5 %   Eosinophils Relative 4.1 %   Basophils Relative 1.0 %  Hemoglobin A1c  Result Value Ref Range   Hgb A1c MFr Bld 5.9 (H) <5.7 % of total Hgb   Mean Plasma Glucose 123 mg/dL   eAG (mmol/L) 6.8 mmol/L  Vitamin B12  Result Value Ref Range   Vitamin B-12 302 200 - 1,100 pg/mL  Lipid panel  Result Value Ref Range   Cholesterol 183 <200 mg/dL   HDL 53 > OR = 50 mg/dL   Triglycerides 333 <545 mg/dL   LDL Cholesterol (Calc) 107 (H) mg/dL (calc)   Total CHOL/HDL Ratio 3.5 <5.0 (calc)   Non-HDL Cholesterol (Calc) 130 (H) <130 mg/dL (calc)      Assessment & Plan:   Problem List Items Addressed This Visit   None Visit Diagnoses     Strain of left trapezius muscle, initial encounter    -  Primary   Neck pain       Need for shingles vaccine       Relevant Medications   SHINGRIX injection       L>R Trapezius Muscle spasm/strain No clear other etiology or injury or trauma Not involving shoulder No neurological focal involvement Consider possible OA/DJD  No recent  imaging  Reassurance with topical therapy Use current Tizanidine PRN since already has May try Voltaren Ice / heat RICE therapy  Printed Shingrix vaccine for pharmacy when ready  Meds ordered this encounter  Medications   SHINGRIX injection    Sig: Inject 0.5 mL into muscle for shingles vaccine. Repeat dose in 2-6  months.    Dispense:  0.5 mL    Refill:  1       Follow up plan: Return if symptoms worsen or fail to improve, for keep apt 06/2021.   Saralyn Pilar, DO [facility] 06/24/2021, 1:41 PM

## 2021-06-24 NOTE — Telephone Encounter (Signed)
Patient called in wanted to speak to nurse about pain neck, says she has to keep taking tizanazine prescribed by emerge othro to stop the pain.She doesn't want to have to keep taking this medication. Please call back with any other suggestions Reason for Disposition  [1] MODERATE neck pain (e.g., interferes with normal activities) AND [2] present > 3 days  Answer Assessment - Initial Assessment Questions 1. ONSET: "When did the pain begin?"      8 days ago 2. LOCATION: "Where does it hurt?"      Neck 3. PATTERN "Does the pain come and go, or has it been constant since it started?"      Comes and goes 4. SEVERITY: "How bad is the pain?"  (Scale 1-10; or mild, moderate, severe)   - NO PAIN (0): no pain or only slight stiffness    - MILD (1-3): doesn't interfere with normal activities    - MODERATE (4-7): interferes with normal activities or awakens from sleep    - SEVERE (8-10):  excruciating pain, unable to do any normal activities      Now - 8 5. RADIATION: "Does the pain go anywhere else, shoot into your arms?"     No 6. CORD SYMPTOMS: "Any weakness or numbness of the arms or legs?"     No 7. CAUSE: "What do you think is causing the neck pain?"     Unsure 8. NECK OVERUSE: "Any recent activities that involved turning or twisting the neck?"     No 9. OTHER SYMPTOMS: "Do you have any other symptoms?" (e.g., headache, fever, chest pain, difficulty breathing, neck swelling)     No 10. PREGNANCY: "Is there any chance you are pregnant?" "When was your last menstrual period?"       No  Protocols used: Neck Pain or Stiffness-A-AH

## 2021-06-24 NOTE — Telephone Encounter (Signed)
Pt. Started having neck pain and pain left shoulder 8 days ago. No known injury. Has been taking Zanaflex as needed. Helps "sometimes, but I don't like taking it." No other symptoms. No arm weakness. Appointment made for today.

## 2021-06-26 ENCOUNTER — Telehealth: Payer: Self-pay | Admitting: Family Medicine

## 2021-06-26 NOTE — Telephone Encounter (Signed)
Pt is calling to report that the neck strain the she was seenfor on 06/24/21. Pt reports taking tizanidine 2mg  - that is not really helping. Today then pain is bad. Pt is also taking elquis Please advise [phone] Preferred Pharmacy- [facility]

## 2021-06-27 NOTE — Telephone Encounter (Signed)
Would you be able to contact her back and find out what she is interested in trying next? I looked over her chart and it sounds like more muscular cause of her pain. She is already trying topical medicine, ice/heat, and muscle relaxant, Tylenol etc.  If the tizanidine is no longer helping, I am not confident that other muscle relaxants would work but we could try a Baclofen if she is interested.  Otherwise, we could do a stronger anti inflammatory like a Prednisone taper over 6 days.  Ultimately may take time primarily. If still not improving next week, she can call for an X-ray we can order that or if she wants to do x-ray I can order that as well and she can just come in for the x-ray apt next week.  Let me know. Thanks  Saralyn Pilar, DO [facility] 06/27/2021, 1:29 PM

## 2021-06-27 NOTE — Telephone Encounter (Signed)
Medication Management (Newest Message First) Burnell Blanks, CMA  You 1 hour ago (2:01 PM)     I spoke with the patient and she said that she tried sleeping a different way last night and her neck has not been hurting all day.   I let her know her options and she wants to stick with the medicine she has now and give it a little more time to work.   I asked her to call me on Tuesday with an update, and if it hasn't still improved by then, we will try something else. She agreed.      Thanks for calling her. Will stay tuned.  Saralyn Pilar, DO [facility] 06/27/2021, 3:10 PM

## 2021-07-15 ENCOUNTER — Other Ambulatory Visit: Payer: Self-pay

## 2021-07-15 DIAGNOSIS — I10 Essential (primary) hypertension: Secondary | ICD-10-CM

## 2021-07-15 DIAGNOSIS — R7303 Prediabetes: Secondary | ICD-10-CM

## 2021-07-15 DIAGNOSIS — E039 Hypothyroidism, unspecified: Secondary | ICD-10-CM

## 2021-07-15 DIAGNOSIS — E782 Mixed hyperlipidemia: Secondary | ICD-10-CM

## 2021-07-15 DIAGNOSIS — Z0000 Encounter for general adult medical examination without abnormal findings: Secondary | ICD-10-CM

## 2021-07-16 ENCOUNTER — Other Ambulatory Visit: Payer: Medicare PPO

## 2021-07-16 DIAGNOSIS — Z0000 Encounter for general adult medical examination without abnormal findings: Secondary | ICD-10-CM | POA: Diagnosis not present

## 2021-07-16 DIAGNOSIS — E538 Deficiency of other specified B group vitamins: Secondary | ICD-10-CM | POA: Diagnosis not present

## 2021-07-16 DIAGNOSIS — I10 Essential (primary) hypertension: Secondary | ICD-10-CM | POA: Diagnosis not present

## 2021-07-16 DIAGNOSIS — E782 Mixed hyperlipidemia: Secondary | ICD-10-CM | POA: Diagnosis not present

## 2021-07-16 DIAGNOSIS — R7303 Prediabetes: Secondary | ICD-10-CM | POA: Diagnosis not present

## 2021-07-16 DIAGNOSIS — E039 Hypothyroidism, unspecified: Secondary | ICD-10-CM | POA: Diagnosis not present

## 2021-07-17 LAB — LIPID PANEL
Cholesterol: 141 mg/dL (ref <200)
HDL: 50 mg/dL (ref >=50)
LDL Cholesterol (Calc): 72 mg/dL (calc)
Non-HDL Cholesterol (Calc): 91 mg/dL (calc) (ref <130)
Total CHOL/HDL Ratio: 2.8 (calc) (ref <5.0)
Triglycerides: 104 mg/dL (ref <150)

## 2021-07-17 LAB — CBC WITH DIFFERENTIAL/PLATELET
Absolute Monocytes: 585 cells/uL (ref 200–950)
Basophils Absolute: 69 cells/uL (ref 0–200)
Basophils Relative: 0.9 %
Eosinophils Absolute: 193 cells/uL (ref 15–500)
Eosinophils Relative: 2.5 %
HCT: 39.9 % (ref 35.0–45.0)
Hemoglobin: 13 g/dL (ref 11.7–15.5)
Lymphs Abs: 3581 cells/uL (ref 850–3900)
MCH: 30 pg (ref 27.0–33.0)
MCHC: 32.6 g/dL (ref 32.0–36.0)
MCV: 92.1 fL (ref 80.0–100.0)
MPV: 9.6 fL (ref 7.5–12.5)
Monocytes Relative: 7.6 %
Neutro Abs: 3273 cells/uL (ref 1500–7800)
Neutrophils Relative %: 42.5 %
Platelets: 280 10*3/uL (ref 140–400)
RBC: 4.33 10*6/uL (ref 3.80–5.10)
RDW: 13.6 % (ref 11.0–15.0)
Total Lymphocyte: 46.5 %
WBC: 7.7 10*3/uL (ref 3.8–10.8)

## 2021-07-17 LAB — COMPREHENSIVE METABOLIC PANEL
AG Ratio: 1.5 (calc) (ref 1.0–2.5)
ALT: 15 U/L (ref 6–29)
AST: 18 U/L (ref 10–35)
Albumin: 4 g/dL (ref 3.6–5.1)
Alkaline phosphatase (APISO): 73 U/L (ref 37–153)
BUN: 14 mg/dL (ref 7–25)
CO2: 25 mmol/L (ref 20–32)
Calcium: 9 mg/dL (ref 8.6–10.4)
Chloride: 105 mmol/L (ref 98–110)
Creat: 0.82 mg/dL (ref 0.60–0.95)
Globulin: 2.6 g/dL (calc) (ref 1.9–3.7)
Glucose, Bld: 97 mg/dL (ref 65–99)
Potassium: 4.4 mmol/L (ref 3.5–5.3)
Sodium: 138 mmol/L (ref 135–146)
Total Bilirubin: 0.6 mg/dL (ref 0.2–1.2)
Total Protein: 6.6 g/dL (ref 6.1–8.1)

## 2021-07-17 LAB — TSH: TSH: 0.32 mIU/L — ABNORMAL LOW (ref 0.40–4.50)

## 2021-07-17 LAB — HEMOGLOBIN A1C
Hgb A1c MFr Bld: 5.8 % of total Hgb — ABNORMAL HIGH (ref <5.7)
Mean Plasma Glucose: 120 mg/dL
eAG (mmol/L): 6.6 mmol/L

## 2021-07-17 LAB — T4, FREE: Free T4: 1.5 ng/dL (ref 0.8–1.8)

## 2021-07-17 LAB — VITAMIN B12: Vitamin B-12: 312 pg/mL (ref 200–1100)

## 2021-07-23 ENCOUNTER — Other Ambulatory Visit: Payer: Self-pay

## 2021-07-23 ENCOUNTER — Ambulatory Visit (INDEPENDENT_AMBULATORY_CARE_PROVIDER_SITE_OTHER): Payer: Medicare PPO | Admitting: Family Medicine

## 2021-07-23 ENCOUNTER — Encounter: Payer: Self-pay | Admitting: Family Medicine

## 2021-07-23 VITALS — BP 135/62 | HR 77 | Ht 63.0 in | Wt 150.8 lb

## 2021-07-23 DIAGNOSIS — K219 Gastro-esophageal reflux disease without esophagitis: Secondary | ICD-10-CM

## 2021-07-23 DIAGNOSIS — M159 Polyosteoarthritis, unspecified: Secondary | ICD-10-CM

## 2021-07-23 DIAGNOSIS — E782 Mixed hyperlipidemia: Secondary | ICD-10-CM | POA: Diagnosis not present

## 2021-07-23 DIAGNOSIS — M797 Fibromyalgia: Secondary | ICD-10-CM | POA: Diagnosis not present

## 2021-07-23 DIAGNOSIS — F411 Generalized anxiety disorder: Secondary | ICD-10-CM

## 2021-07-23 DIAGNOSIS — R7303 Prediabetes: Secondary | ICD-10-CM | POA: Diagnosis not present

## 2021-07-23 DIAGNOSIS — E032 Hypothyroidism due to medicaments and other exogenous substances: Secondary | ICD-10-CM | POA: Diagnosis not present

## 2021-07-23 DIAGNOSIS — I480 Paroxysmal atrial fibrillation: Secondary | ICD-10-CM

## 2021-07-23 DIAGNOSIS — M8949 Other hypertrophic osteoarthropathy, multiple sites: Secondary | ICD-10-CM

## 2021-07-23 DIAGNOSIS — I10 Essential (primary) hypertension: Secondary | ICD-10-CM | POA: Diagnosis not present

## 2021-07-23 DIAGNOSIS — G629 Polyneuropathy, unspecified: Secondary | ICD-10-CM | POA: Insufficient documentation

## 2021-07-23 DIAGNOSIS — Z0000 Encounter for general adult medical examination without abnormal findings: Secondary | ICD-10-CM

## 2021-07-23 DIAGNOSIS — F339 Major depressive disorder, recurrent, unspecified: Secondary | ICD-10-CM | POA: Diagnosis not present

## 2021-07-23 MED ORDER — GABAPENTIN 300 MG PO CAPS: ORAL_CAPSULE | ORAL | 3 refills | Status: DC

## 2021-07-23 MED ORDER — FLUOXETINE HCL 40 MG PO CAPS: 40.0000 mg | ORAL_CAPSULE | Freq: Every day | ORAL | 3 refills | Status: DC

## 2021-07-23 MED ORDER — OMEPRAZOLE 40 MG PO CPDR: 40.0000 mg | DELAYED_RELEASE_CAPSULE | Freq: Every day | ORAL | 3 refills | Status: AC | PRN

## 2021-07-23 NOTE — Assessment & Plan Note (Signed)
Improved. episodic indigestion possibly dyspepsia side effect from Prozac Continue Omeprazole 40mg  daily PRN  Discussed we could switch SSRI if need in future or find other options w/ GERD therapy, may ultimately need reconsider with GI

## 2021-07-23 NOTE — Assessment & Plan Note (Addendum)
Persistent problem bilateral lower extremity Likely secondary to low back lumbar DDD spinal problems, likely as source of her etiology No DM, only PreDM. No other evidence of neuropathic etiology She declines neurologist for Nerve conduction study May try OTC Alpha Lipoic Acid Already on Gabapentin Counseling provided

## 2021-07-23 NOTE — Assessment & Plan Note (Signed)
Well-controlled HTN. History of low BP in past - Home BP readings normal  Paroxysmal AFib    Plan:  1. Continue current BP regimen Metoprolol 12.5mg  DAILY 2. Encourage improved lifestyle - low sodium diet, regular exercise 3. Continue monitor BP outside office, bring readings to next visit, if persistently >140/90 or new symptoms notify office sooner

## 2021-07-23 NOTE — Patient Instructions (Addendum)
Thank you for coming to the office today.  Neuropathy bilateral lower extremity.  Try OTC Alpha Lipoic Acid.  Continue Gabapentin.  Avoid high impact activities that would cause problem with back or legs.  Fiber supplements will help.  OTC Peppermint Oil (Triple Coated Capsule) 180mg  take one 3 times daily to reduce diarrhea  Try probiotic as needed.   Please schedule a Follow-up Appointment to: Return in about 6 months (around 01/23/2022) for 6 month follow-up Mood PHQ, Neuropathy, GERD.  If you have any other questions or concerns, please feel free to call the office or send a message through MyChart. You may also schedule an earlier appointment if necessary.  Additionally, you may be receiving a survey about your experience at our office within a few days to 1 week by e-mail or mail. We value your feedback.  01/25/2022, DO [facility], [facility]

## 2021-07-23 NOTE — Assessment & Plan Note (Signed)
Significantly improved now Controlled cholesterol on statin and lifestyle Controlled LDL  Plan: 1. Continue current meds - Atorvastatin 80mg  daily 2. On anticoagulation AFib 3,. Encourage improved lifestyle - low carb/cholesterol, reduce portion size, continue improving regular exercise

## 2021-07-23 NOTE — Assessment & Plan Note (Signed)
TSH improved but slightly low 0.32, normal T4 1.5 Continue Levothyroxine daily

## 2021-07-23 NOTE — Assessment & Plan Note (Signed)
Stable without evidence of AFib today on exam Rate control on Metoprolol low dose On anticoagulation Eliquis Followed by [facility]

## 2021-07-23 NOTE — Assessment & Plan Note (Signed)
Stable chronic mood, on SSRI Improved on Prozac Failed Sertraline Supsected secondary insomnia component causing tired/fatigue  Plan Continue Fluoxetine Prozac 40mg  daily  - consider other SSRI vs SNRI option, possibly wellbutrin.

## 2021-07-23 NOTE — Assessment & Plan Note (Signed)
improved A1c5.8  Plan:  1. Not on any therapy currently  2. Encourage improved lifestyle - low carb, low sugar diet, reduce portion size, continue improving regular exercise as tolerated

## 2021-07-23 NOTE — Progress Notes (Signed)
Subjective:    Patient ID: Stacey Duncan, female    DOB: [birth year], 87 y.o.   MRN: [id number]  Stacey Duncan is a 87 y.o. female presenting on 07/23/2021 for Annual Exam   HPI  Here for Annual Physical and Lab Review.  Hyperlipidemia Paroxysmal Atrial Fibrillation with palpitations Followed by [facility] On Atorvastatin 80 Last lab shows LDL down to 72. She also admits not feeling rapid heart beat or symptoms significantly with palpitations   Hypothyroidism TSH Thyroid Function Tests - Elevated TSH to 8.72 previously after last visit dose inc from Levothyroxine 100 to .  Last lab now shows TSH 0.32 and normal T4 1.5 She is doing well on current doseage Levothyroxine 112   Pre-Diabetes  Lab result A1c up to 5.8, improved from prior. CBG not checking Meds: none Currently on ARB Diet: improved diet. Low carb. - admits some dietary issues with holidays Exercise: limited activity due to pain and fibromyalgia, increased tiredness Denies hypoglycemia, urinary frequency, polyuria    Fibromyalgia / Osteoarthritis Chronic problem. No new concerns today   Bilateral Foot Numbness Still persistent problem both feet tops and bottoms, causing tingling but not pain  GERD Diarrhea Reports episodic symptoms for past 4+ years, has rare episode of diarrhea. Question IBS. She has rare fecal accidents with loose stools     Major Depression, chronic recurrent moderate Unchanged since last visit. Still has significant mood issues often due to losses in family. - she is still very anxious and worrying about her husband, he may have congestive heart failure - She is currently still taking Fluoxetine 40mg  daily takes with yogurt or milk to help indigestion - she has GERD and reflux symptoms - She has been on Zoloft in past did not do well, highest dose of Fluoxetine 60mg .   Health Maintenance: UTD COVID19 vaccine updated including booster. She is due for booster  #2   Depression screen [facility] 2/9 07/23/2021 06/24/2021 03/18/2021  Decreased Interest 1 1 0  Down, Depressed, Hopeless 1 1 1   PHQ - 2 Score 2 2 1   Altered sleeping 1 1 0  Tired, decreased energy 2 2 3   Change in appetite 2 2 0  Feeling bad or failure about yourself  0 0 0  Trouble concentrating 0 0 0  Moving slowly or fidgety/restless 0 0 0  Suicidal thoughts 0 0 0  PHQ-9 Score 7 7 4   Difficult doing work/chores Somewhat difficult Somewhat difficult Somewhat difficult  Some recent data might be hidden   GAD 7 : Generalized Anxiety Score 06/24/2021 09/24/2020 02/27/2020 11/09/2017  Nervous, Anxious, on Edge 1 2 1 1   Control/stop worrying 0 2 0 3  Worry too much - different things 0 2 1 2   Trouble relaxing 1 1 1  0  Restless 0 0 0 0  Easily annoyed or irritable 1 2 1 3   Afraid - awful might happen 1 2 1 1   Total GAD 7 Score 4 11 5 10   Anxiety Difficulty Somewhat difficult Somewhat difficult Not difficult at all Somewhat difficult      Past Medical History:  Diagnosis Date   Anxiety    Fibromyalgia    GERD (gastroesophageal reflux disease)    History of echocardiogram    a. 02/2016 Echo: EF 60-65%, no rwma. Nl RV fxn and PASP.   HTN (hypertension)    Hypothyroidism    Mixed hyperlipidemia    Osteoarthritis    a. bilateral knees   PAF (paroxysmal atrial fibrillation) (HCC)  a. 02/2015 & 09/2017 - admit w/ Afib, briefly req Amio; b. CHA2DSVASc = 4-->Eliquis.   Recurrent UTI (urinary tract infection)    Sinus bradycardia    a. Has limited beta blocker dosing.   Past Surgical History:  Procedure Laterality Date   ABDOMINAL HYSTERECTOMY     CHOLECYSTECTOMY     ROTATOR CUFF REPAIR     left    Social History   Socioeconomic History   Marital status: Married    Spouse name: Not on file   Number of children: Not on file   Years of education: Not on file   Highest education level: Bachelor's degree (e.g., BA, AB, BS)  Occupational History   Occupation: retired  Tobacco Use    Smoking status: Never   Smokeless tobacco: Never  Vaping Use   Vaping Use: Never used  Substance and Sexual Activity   Alcohol use: Yes    Alcohol/week: 7.0 standard drinks    Types: 7 Glasses of wine per week    Comment: glass of wine daily   Drug use: No   Sexual activity: Not Currently    Birth control/protection: Post-menopausal  Other Topics Concern   Not on file  Social History Narrative   Not on file   Social Determinants of Health   Financial Resource Strain: Low Risk    Difficulty of Paying Living Expenses: Not hard at all  Food Insecurity: No Food Insecurity   Worried About Programme researcher, broadcasting/film/video in the Last Year: Never true   Ran Out of Food in the Last Year: Never true  Transportation Needs: No Transportation Needs   Lack of Transportation (Medical): No   Lack of Transportation (Non-Medical): No  Physical Activity: Inactive   Days of Exercise per Week: 0 days   Minutes of Exercise per Session: 0 min  Stress: Stress Concern Present   Feeling of Stress : To some extent  Social Connections: Not on file  Intimate Partner Violence: Not on file   Family History  Problem Relation Age of Onset   Heart disease Mother 48       CABG   Heart attack Maternal Grandfather    Heart attack Maternal Grandmother    Current Outpatient Medications on File Prior to Visit  Medication Sig   atorvastatin (LIPITOR) 80 MG tablet TAKE 1 TABLET EVERY DAY AT 6:00 PM   Cholecalciferol 25 MCG (1000 UT) tablet Take 1,000 Units by mouth daily.    ELIQUIS 5 MG TABS tablet TAKE 1 TABLET TWICE DAILY   hydrALAZINE (APRESOLINE) 25 MG tablet TAKE 1 TABLET TWICE DAILY   levothyroxine (SYNTHROID) 112 MCG tablet TAKE 1 TABLET EVERY DAY (NEW DOSE)   metoprolol tartrate (LOPRESSOR) 25 MG tablet TAKE 1/2 TABLET TWICE A DAY. CONTACT OFFICE FOR APPOINTMENT [phone]   nystatin (MYCOSTATIN/NYSTOP) powder Apply 1 application topically 3 (three) times daily as needed. For 2 to 4 weeks as needed    [facility] injection Inject 0.5 mL into muscle for shingles vaccine. Repeat dose in 2-6 months.   tiZANidine (ZANAFLEX) 2 MG tablet Take 2 mg by mouth every 6 (six) hours as needed for muscle spasms.   diclofenac Sodium (QC DICLOFENAC SODIUM) 1 % GEL Apply 2 g topically 3 (three) times daily as needed. For arthritis of hands, elbow (Patient not taking: No sig reported)   No current facility-administered medications on file prior to visit.    Review of Systems  Constitutional:  Negative for activity change, appetite change, chills, diaphoresis, fatigue and  fever.  HENT:  Negative for congestion and hearing loss.   Eyes:  Negative for visual disturbance.  Respiratory:  Negative for cough, chest tightness, shortness of breath and wheezing.   Cardiovascular:  Negative for chest pain, palpitations and leg swelling.  Gastrointestinal:  Negative for abdominal pain, constipation, diarrhea, nausea and vomiting.  Genitourinary:  Negative for dysuria, frequency and hematuria.  Musculoskeletal:  Negative for arthralgias and neck pain.  Skin:  Negative for rash.  Allergic/Immunologic: Negative for environmental allergies.  Neurological:  Negative for dizziness, weakness, light-headedness, numbness and headaches.  Hematological:  Negative for adenopathy.  Psychiatric/Behavioral:  Negative for behavioral problems, dysphoric mood and sleep disturbance.   Per HPI unless specifically indicated above      Objective:    BP 135/62   Pulse 77   Ht 5\' 3"  (1.6 m)   Wt 150 lb 12.8 oz (68.4 kg)   SpO2 98%   BMI 26.71 kg/m   Wt Readings from Last 3 Encounters:  07/23/21 150 lb 12.8 oz (68.4 kg)  06/24/21 153 lb 6.4 oz (69.6 kg)  03/18/21 152 lb (68.9 kg)    Physical Exam Vitals and nursing note reviewed.  Constitutional:      General: She is not in acute distress.    Appearance: She is well-developed. She is not diaphoretic.     Comments: Well-appearing, comfortable, cooperative  HENT:     Head:  Normocephalic and atraumatic.  Eyes:     General:        Right eye: No discharge.        Left eye: No discharge.     Conjunctiva/sclera: Conjunctivae normal.     Pupils: Pupils are equal, round, and reactive to light.  Neck:     Thyroid: No thyromegaly.  Cardiovascular:     Rate and Rhythm: Normal rate and regular rhythm.     Pulses: Normal pulses.     Heart sounds: Normal heart sounds. No murmur heard. Pulmonary:     Effort: Pulmonary effort is normal. No respiratory distress.     Breath sounds: Normal breath sounds. No wheezing or rales.  Abdominal:     General: Bowel sounds are normal. There is no distension.     Palpations: Abdomen is soft. There is no mass.     Tenderness: There is no abdominal tenderness.  Musculoskeletal:        General: No tenderness. Normal range of motion.     Cervical back: Normal range of motion and neck supple.     Right lower leg: No edema.     Left lower leg: No edema.     Comments: Upper / Lower Extremities: - Normal muscle tone, strength bilateral upper extremities 5/5, lower extremities 5/5  Lymphadenopathy:     Cervical: No cervical adenopathy.  Skin:    General: Skin is warm and dry.     Findings: No erythema or rash.  Neurological:     Mental Status: She is alert and oriented to person, place, and time.     Sensory: Sensory deficit (reduced monofilament sensation lower extremities bilateral dorsal and plantar) present.     Comments: Distal sensation intact to light touch all extremities  Psychiatric:        Mood and Affect: Mood normal.        Behavior: Behavior normal.        Thought Content: Thought content normal.     Comments: Well groomed, good eye contact, normal speech and thoughts  Results for orders placed or performed in visit on 07/15/21  Lipid panel  Result Value Ref Range   Cholesterol 141 <200 mg/dL   HDL 50 > OR = 50 mg/dL   Triglycerides 259104 <563<150 mg/dL   LDL Cholesterol (Calc) 72 mg/dL (calc)   Total  CHOL/HDL Ratio 2.8 <5.0 (calc)   Non-HDL Cholesterol (Calc) 91 <875<130 mg/dL (calc)  Vitamin I43B12  Result Value Ref Range   Vitamin B-12 312 200 - 1,100 pg/mL  HgB A1c  Result Value Ref Range   Hgb A1c MFr Bld 5.8 (H) <5.7 % of total Hgb   Mean Plasma Glucose 120 mg/dL   eAG (mmol/L) 6.6 mmol/L  CBC with Differential  Result Value Ref Range   WBC 7.7 3.8 - 10.8 Thousand/uL   RBC 4.33 3.80 - 5.10 Million/uL   Hemoglobin 13.0 11.7 - 15.5 g/dL   HCT 32.939.9 51.835.0 - 84.145.0 %   MCV 92.1 80.0 - 100.0 fL   MCH 30.0 27.0 - 33.0 pg   MCHC 32.6 32.0 - 36.0 g/dL   RDW 66.013.6 63.011.0 - 16.015.0 %   Platelets 280 140 - 400 Thousand/uL   MPV 9.6 7.5 - 12.5 fL   Neutro Abs 3,273 1,500 - 7,800 cells/uL   Lymphs Abs 3,581 850 - 3,900 cells/uL   Absolute Monocytes 585 200 - 950 cells/uL   Eosinophils Absolute 193 15 - 500 cells/uL   Basophils Absolute 69 0 - 200 cells/uL   Neutrophils Relative % 42.5 %   Total Lymphocyte 46.5 %   Monocytes Relative 7.6 %   Eosinophils Relative 2.5 %   Basophils Relative 0.9 %  Comprehensive Metabolic Panel (CMET)  Result Value Ref Range   Glucose, Bld 97 65 - 99 mg/dL   BUN 14 7 - 25 mg/dL   Creat 1.090.82 3.230.60 - 5.570.95 mg/dL   BUN/Creatinine Ratio NOT APPLICABLE 6 - 22 (calc)   Sodium 138 135 - 146 mmol/L   Potassium 4.4 3.5 - 5.3 mmol/L   Chloride 105 98 - 110 mmol/L   CO2 25 20 - 32 mmol/L   Calcium 9.0 8.6 - 10.4 mg/dL   Total Protein 6.6 6.1 - 8.1 g/dL   Albumin 4.0 3.6 - 5.1 g/dL   Globulin 2.6 1.9 - 3.7 g/dL (calc)   AG Ratio 1.5 1.0 - 2.5 (calc)   Total Bilirubin 0.6 0.2 - 1.2 mg/dL   Alkaline phosphatase (APISO) 73 37 - 153 U/L   AST 18 10 - 35 U/L   ALT 15 6 - 29 U/L  T4, free  Result Value Ref Range   Free T4 1.5 0.8 - 1.8 ng/dL  TSH  Result Value Ref Range   TSH 0.32 (L) 0.40 - 4.50 mIU/L      Assessment & Plan:   Problem List Items Addressed This Visit     Sensory neuropathy    Persistent problem bilateral lower extremity Likely secondary to low  back lumbar DDD spinal problems, likely as source of her etiology No DM, only PreDM. No other evidence of neuropathic etiology She declines neurologist for Nerve conduction study May try OTC Alpha Lipoic Acid Already on Gabapentin Counseling provided       Pre-diabetes    improved A1c5.8  Plan:  1. Not on any therapy currently  2. Encourage improved lifestyle - low carb, low sugar diet, reduce portion size, continue improving regular exercise as tolerated       Paroxysmal atrial fibrillation (HCC)    Stable without evidence  of AFib today on exam Rate control on Metoprolol low dose On anticoagulation Eliquis Followed by [facility]       Osteoarthritis   Relevant Medications   gabapentin (NEURONTIN) 300 MG capsule   Major depression, recurrent, chronic (HCC)    Stable chronic mood, on SSRI Improved on Prozac Failed Sertraline Supsected secondary insomnia component causing tired/fatigue  Plan Continue Fluoxetine Prozac 40mg  daily  - consider other SSRI vs SNRI option, possibly wellbutrin.       Relevant Medications   FLUoxetine (PROZAC) 40 MG capsule   Hypothyroidism    TSH improved but slightly low 0.32, normal T4 1.5 Continue Levothyroxine daily       Hyperlipidemia    Significantly improved now Controlled cholesterol on statin and lifestyle Controlled LDL  Plan: 1. Continue current meds - Atorvastatin 80mg  daily 2. On anticoagulation AFib 3,. Encourage improved lifestyle - low carb/cholesterol, reduce portion size, continue improving regular exercise        GERD (gastroesophageal reflux disease)    Improved. episodic indigestion possibly dyspepsia side effect from Prozac Continue Omeprazole 40mg  daily PRN  Discussed we could switch SSRI if need in future or find other options w/ GERD therapy, may ultimately need reconsider with GI       Relevant Medications   omeprazole (PRILOSEC) 40 MG capsule   GAD (generalized anxiety disorder)    Relevant Medications   FLUoxetine (PROZAC) 40 MG capsule   Fibromyalgia   Relevant Medications   gabapentin (NEURONTIN) 300 MG capsule   FLUoxetine (PROZAC) 40 MG capsule   Essential hypertension    Well-controlled HTN. History of low BP in past - Home BP readings normal  Paroxysmal AFib    Plan:  1. Continue current BP regimen Metoprolol 12.5mg  DAILY 2. Encourage improved lifestyle - low sodium diet, regular exercise 3. Continue monitor BP outside office, bring readings to next visit, if persistently >140/90 or new symptoms notify office sooner       Other Visit Diagnoses     Annual physical exam    -  Primary       Updated Health Maintenance information Due for COVID booster #2 in future Reviewed recent lab results with patient Encouraged improvement to lifestyle with diet and exercise Goal maintain weight   Meds ordered this encounter  Medications   gabapentin (NEURONTIN) 300 MG capsule    Sig: Take 1 capsule (300mg ) in morning, and take 2 caps (600mg ) in evening.    Dispense:  270 capsule    Refill:  3   FLUoxetine (PROZAC) 40 MG capsule    Sig: Take 1 capsule (40 mg total) by mouth daily.    Dispense:  90 capsule    Refill:  3   omeprazole (PRILOSEC) 40 MG capsule    Sig: Take 1 capsule (40 mg total) by mouth daily as needed.    Dispense:  90 capsule    Refill:  3    Follow up plan: Return in about 6 months (around 01/23/2022) for 6 month follow-up Mood PHQ, Neuropathy, GERD.  , DO [facility] 07/23/2021, 1:24 PM

## 2021-08-13 ENCOUNTER — Telehealth: Payer: Self-pay | Admitting: Family Medicine

## 2021-08-13 ENCOUNTER — Other Ambulatory Visit: Payer: Self-pay | Admitting: Family Medicine

## 2021-08-13 DIAGNOSIS — M797 Fibromyalgia: Secondary | ICD-10-CM

## 2021-08-13 DIAGNOSIS — M8949 Other hypertrophic osteoarthropathy, multiple sites: Secondary | ICD-10-CM

## 2021-08-13 DIAGNOSIS — M159 Polyosteoarthritis, unspecified: Secondary | ICD-10-CM

## 2021-08-13 NOTE — Telephone Encounter (Incomplete)
Medication Refill - Medication:  Has the patient contacted their pharmacy? {yes [id number]} (Agent: If no, request that the patient contact the pharmacy for the refill.) (Agent: If yes, when and what did the pharmacy advise?)  Preferred Pharmacy (with phone number or street name): ***  Agent: Please be advised that RX refills may take up to 3 business days. We ask that you follow-up with your pharmacy.  

## 2021-08-13 NOTE — Telephone Encounter (Signed)
Requested medication (s) are due for refill today - no-Rx has been sent to pharamcy  Requested medication (s) are on the active medication list -yes  Future visit scheduled -yes  Last refill: 07/23/21 #270 3 RF  Notes to clinic: Patient is calling - she reports she has not received her Rx from [facility] and she is firing them for this Rx- she is requesting a 60 day supply with RF sent to [place] for this Rx- request for changes to Rx sent to office.  Requested Prescriptions  Pending Prescriptions Disp Refills   gabapentin (NEURONTIN) 300 MG capsule [Pharmacy Med Name: GABAPENTIN 300 MG CAPSULE] 30 capsule 0    Sig: Take 1 capsule (300mg ) in morning, take 1 cap (300mg ) in afternoon, and take 2 caps (600mg ) in evening.     Neurology: Anticonvulsants - gabapentin Passed - 08/13/2021 12:07 PM      Passed - Valid encounter within last 12 months    Recent Outpatient Visits           3 weeks ago Annual physical exam   [facility], 08/15/2021, DO   1 month ago Strain of left trapezius muscle, initial encounter   [facility] [place], DO   6 months ago Pre-diabetes   [facility], DO   10 months ago Acquired hypothyroidism   [facility], DO   11 months ago Acute cystitis with hematuria   [facility], Smitty Cords, DO       Future Appointments             In 5 months [facility], Althea Charon, DO [facility]   In 7 months  [facility], [facility]               Requested Prescriptions  Pending Prescriptions Disp Refills   gabapentin (NEURONTIN) 300 MG capsule [Pharmacy Med Name: GABAPENTIN 300 MG CAPSULE] 30 capsule 0    Sig: Take 1 capsule (300mg ) in morning, take 1 cap (300mg ) in afternoon, and take 2 caps (600mg ) in evening.     Neurology: Anticonvulsants - gabapentin Passed -  08/13/2021 12:07 PM      Passed - Valid encounter within last 12 months    Recent Outpatient Visits           3 weeks ago Annual physical exam   [facility] [place], , DO   1 month ago Strain of left trapezius muscle, initial encounter   [facility] [place], 08/15/2021, DO   6 months ago Pre-diabetes   [facility] [place], DO   10 months ago Acquired hypothyroidism   [facility], DO   11 months ago Acute cystitis with hematuria   [facility] [place] Ne[place], [facility], DO       Future Appointments             In 5 months Smitty Cords, [facility], DO [facility], [facility]   In 7 months  [facility], Althea Charon

## 2021-08-19 ENCOUNTER — Ambulatory Visit: Payer: Medicare PPO | Admitting: Internal Medicine

## 2021-08-19 ENCOUNTER — Encounter: Payer: Self-pay | Admitting: Internal Medicine

## 2021-08-19 ENCOUNTER — Other Ambulatory Visit: Payer: Self-pay

## 2021-08-19 VITALS — BP 121/54 | HR 69 | Temp 97.4°F | Resp 17 | Ht 63.0 in | Wt 150.2 lb

## 2021-08-19 DIAGNOSIS — B372 Candidiasis of skin and nail: Secondary | ICD-10-CM | POA: Diagnosis not present

## 2021-08-19 DIAGNOSIS — N9089 Other specified noninflammatory disorders of vulva and perineum: Secondary | ICD-10-CM | POA: Diagnosis not present

## 2021-08-19 DIAGNOSIS — F32A Depression, unspecified: Secondary | ICD-10-CM | POA: Diagnosis not present

## 2021-08-19 DIAGNOSIS — F419 Anxiety disorder, unspecified: Secondary | ICD-10-CM

## 2021-08-19 DIAGNOSIS — H1031 Unspecified acute conjunctivitis, right eye: Secondary | ICD-10-CM

## 2021-08-19 DIAGNOSIS — K591 Functional diarrhea: Secondary | ICD-10-CM | POA: Diagnosis not present

## 2021-08-19 MED ORDER — CLOBETASOL PROPIONATE 0.05 % EX CREA: 1.0000 "application " | TOPICAL_CREAM | Freq: Two times a day (BID) | CUTANEOUS | 0 refills | Status: DC

## 2021-08-19 MED ORDER — FLUCONAZOLE 150 MG PO TABS: 150.0000 mg | ORAL_TABLET | ORAL | 0 refills | Status: DC

## 2021-08-19 MED ORDER — POLYMYXIN B-TRIMETHOPRIM 10000-0.1 UNIT/ML-% OP SOLN: 1.0000 [drp] | Freq: Four times a day (QID) | OPHTHALMIC | 0 refills | Status: AC

## 2021-08-19 MED ORDER — BUPROPION HCL ER (XL) 150 MG PO TB24: 150.0000 mg | ORAL_TABLET | Freq: Every day | ORAL | 0 refills | Status: DC

## 2021-08-19 NOTE — Progress Notes (Signed)
Subjective:    Patient ID: Stacey Duncan, female    DOB: [birth year], 87 y.o.   MRN: [id number]  HPI  Patient presents the clinic today with complaint of itching, redness and drainage at that inner corner of her right eye.  She noticed this 2 weeks ago.  She denies any vision changes at this time.  She denies runny nose, nasal congestion, ear pain, sore throat or cough.  She denies fever, chills or body aches.  She has tried warm compresses OTC with some improvement in her symptoms.  Also reports a rash under her bilateral breast.  She reports this started many months ago.  The rash itches and burns.  She reports she was given a cream by Dr. Althea Charon which helps improve it while she is using it but the rash returns shortly after.  She has seen a dermatologist for the same and reports she was given a powder which does not really seem to be effective.  She also reports itching of her bilateral labia.  She denies pelvic pressure, urinary urgency, frequency, dysuria or blood in her urine.  She denies vaginal discharge, odor or abnormal bleeding.  She has not taken anything OTC for her symptoms.  She is not currently sexually active.  She also reports diarrhea after eating out.  She reports this is been an ongoing issue for many years.  She denies associated pelvic cramping, nausea, vomiting or blood in her stool.  She reports she was taking Imodium OTC which did help some but was reading the label advised if you have a heart condition that you should not take this so she stopped.  She also reports persistent anxiety and depression.  She reports she has a lot of family stress that is causing this.  She is currently managed on Fluoxetine that she has been on for years.  She is not currently seeing a therapist.  She denies SI/HI.  Review of Systems     Past Medical History:  Diagnosis Date   Anxiety    Fibromyalgia    GERD (gastroesophageal reflux disease)    History of echocardiogram     a. 02/2016 Echo: EF 60-65%, no rwma. Nl RV fxn and PASP.   HTN (hypertension)    Hypothyroidism    Mixed hyperlipidemia    Osteoarthritis    a. bilateral knees   PAF (paroxysmal atrial fibrillation) (HCC)    a. 02/2015 & 09/2017 - admit w/ Afib, briefly req Amio; b. CHA2DSVASc = 4-->Eliquis.   Recurrent UTI (urinary tract infection)    Sinus bradycardia    a. Has limited beta blocker dosing.    Current Outpatient Medications  Medication Sig Dispense Refill   atorvastatin (LIPITOR) 80 MG tablet TAKE 1 TABLET EVERY DAY AT 6:00 PM 90 tablet 2   Cholecalciferol 25 MCG (1000 UT) tablet Take 1,000 Units by mouth daily.      diclofenac Sodium (QC DICLOFENAC SODIUM) 1 % GEL Apply 2 g topically 3 (three) times daily as needed. For arthritis of hands, elbow (Patient not taking: No sig reported) 100 g 2   ELIQUIS 5 MG TABS tablet TAKE 1 TABLET TWICE DAILY 180 tablet 1   FLUoxetine (PROZAC) 40 MG capsule Take 1 capsule (40 mg total) by mouth daily. 90 capsule 3   gabapentin (NEURONTIN) 300 MG capsule Take 1 capsule (300mg ) in morning, take 1 cap (300mg ) in afternoon, and take 2 caps (600mg ) in evening. 120 capsule 0   hydrALAZINE (APRESOLINE) 25 MG tablet  TAKE 1 TABLET TWICE DAILY 180 tablet 2   levothyroxine (SYNTHROID) 112 MCG tablet TAKE 1 TABLET EVERY DAY (NEW DOSE) 90 tablet 1   metoprolol tartrate (LOPRESSOR) 25 MG tablet TAKE 1/2 TABLET TWICE A DAY. CONTACT OFFICE FOR APPOINTMENT [phone] 90 tablet 3   nystatin (MYCOSTATIN/NYSTOP) powder Apply 1 application topically 3 (three) times daily as needed. For 2 to 4 weeks as needed 45 g 0   omeprazole (PRILOSEC) 40 MG capsule Take 1 capsule (40 mg total) by mouth daily as needed. 90 capsule 3   SHINGRIX injection Inject 0.5 mL into muscle for shingles vaccine. Repeat dose in 2-6 months. 0.5 mL 1   tiZANidine (ZANAFLEX) 2 MG tablet Take 2 mg by mouth every 6 (six) hours as needed for muscle spasms.     No current facility-administered medications  for this visit.    Allergies  Allergen Reactions   Sulfa Antibiotics Other (See Comments)   Sulfacetamide Sodium     Other reaction(s): Other (See Comments)    Family History  Problem Relation Age of Onset   Heart disease Mother 55       CABG   Heart attack Maternal Grandfather    Heart attack Maternal Grandmother     Social History   Socioeconomic History   Marital status: Married    Spouse name: Not on file   Number of children: Not on file   Years of education: Not on file   Highest education level: Bachelor's degree (e.g., BA, AB, BS)  Occupational History   Occupation: retired  Tobacco Use   Smoking status: Never   Smokeless tobacco: Never  Vaping Use   Vaping Use: Never used  Substance and Sexual Activity   Alcohol use: Yes    Alcohol/week: 7.0 standard drinks    Types: 7 Glasses of wine per week    Comment: glass of wine daily   Drug use: No   Sexual activity: Not Currently    Birth control/protection: Post-menopausal  Other Topics Concern   Not on file  Social History Narrative   Not on file   Social Determinants of Health   Financial Resource Strain: Low Risk    Difficulty of Paying Living Expenses: Not hard at all  Food Insecurity: No Food Insecurity   Worried About Programme researcher, broadcasting/film/video in the Last Year: Never true   Ran Out of Food in the Last Year: Never true  Transportation Needs: No Transportation Needs   Lack of Transportation (Medical): No   Lack of Transportation (Non-Medical): No  Physical Activity: Inactive   Days of Exercise per Week: 0 days   Minutes of Exercise per Session: 0 min  Stress: Stress Concern Present   Feeling of Stress : To some extent  Social Connections: Not on file  Intimate Partner Violence: Not on file     Constitutional: Denies fever, malaise, fatigue, headache or abrupt weight changes.  HEENT: Patient reports right eye redness, itching and discharge.  Denies eye pain, ear pain, ringing in the ears, wax buildup,  runny nose, nasal congestion, bloody nose, or sore throat. Respiratory: Denies difficulty breathing, shortness of breath, cough or sputum production.   Cardiovascular: Denies chest pain, chest tightness, palpitations or swelling in the hands or feet.  Gastrointestinal: Patient reports intermittent loose stools.  Denies abdominal pain, bloating, constipation, or blood in the stool.  GU: Denies urgency, frequency, pain with urination, burning sensation, blood in urine, odor or discharge. Musculoskeletal: Denies decrease in range of motion, difficulty  with gait, muscle pain or joint pain and swelling.  Skin: Patient reports rash under bilateral breasts, itching of labia.  Denies ulcercations.  Neurological: Denies dizziness, difficulty with memory, difficulty with speech or problems with balance and coordination.  Psych: Patient has a history of anxiety and depression.  Denies SI/HI.  No other specific complaints in a complete review of systems (except as listed in HPI above).  Objective:   Physical Exam  BP (!) 121/54 (BP Location: Left Arm, Patient Position: Sitting, Cuff Size: Normal)   Pulse 69   Temp (!) 97.4 F (36.3 C) (Temporal)   Resp 17   Ht 5\' 3"  (1.6 m)   Wt 150 lb 3.2 oz (68.1 kg)   SpO2 99%   BMI 26.61 kg/m   Wt Readings from Last 3 Encounters:  07/23/21 150 lb 12.8 oz (68.4 kg)  06/24/21 153 lb 6.4 oz (69.6 kg)  03/18/21 152 lb (68.9 kg)    General: Appears her stated age, overweight, in NAD. Skin: Erythematous convalescent rash noted under bilateral breast, R >L. HEENT: Head: normal shape and size; Eyes: sclera white, no icterus, conjunctiva pink.  She has matting noted of the inner canthus of the right eye;  Cardiovascular: Normal rate and rhythm. S1,S2 noted.  No murmur, rubs or gallops noted. Pulmonary/Chest: Normal effort and positive vesicular breath sounds. No respiratory distress. No wheezes, rales or ronchi noted.  Abdomen: Soft and nontender. Normal bowel  sounds.  Musculoskeletal: Gait slow and steady with use of cane. Neurological: Alert and oriented.  Psychiatric: Mood and affect normal.  Mildly anxious appearing.  Judgment and thought content normal.     BMET    Component Value Date/Time   NA 138 07/16/2021 0844   NA 140 06/22/2019 1424   NA 141 03/29/2015 0501   K 4.4 07/16/2021 0844   K 3.8 03/29/2015 0501   CL 105 07/16/2021 0844   CL 110 03/29/2015 0501   CO2 25 07/16/2021 0844   CO2 23 03/29/2015 0501   GLUCOSE 97 07/16/2021 0844   GLUCOSE 110 (H) 03/29/2015 0501   BUN 14 07/16/2021 0844   BUN 14 06/22/2019 1424   BUN 10 03/29/2015 0501   CREATININE 0.82 07/16/2021 0844   CALCIUM 9.0 07/16/2021 0844   CALCIUM 8.5 (L) 03/29/2015 0501   GFRNONAA 57 (L) 01/08/2021 0852   GFRAA 66 01/08/2021 0852    Lipid Panel     Component Value Date/Time   CHOL 141 07/16/2021 0844   CHOL 267 (H) 06/22/2019 1424   TRIG 104 07/16/2021 0844   HDL 50 07/16/2021 0844   HDL 50 06/22/2019 1424   CHOLHDL 2.8 07/16/2021 0844   VLDL 28 02/03/2017 0000   LDLCALC 72 07/16/2021 0844    CBC    Component Value Date/Time   WBC 7.7 07/16/2021 0844   RBC 4.33 07/16/2021 0844   HGB 13.0 07/16/2021 0844   HGB 13.3 06/22/2019 1424   HCT 39.9 07/16/2021 0844   HCT 40.4 06/22/2019 1424   PLT 280 07/16/2021 0844   PLT 295 06/22/2019 1424   MCV 92.1 07/16/2021 0844   MCV 89 06/22/2019 1424   MCV 90 03/29/2015 0501   MCH 30.0 07/16/2021 0844   MCHC 32.6 07/16/2021 0844   RDW 13.6 07/16/2021 0844   RDW 14.1 06/22/2019 1424   RDW 13.9 03/29/2015 0501   LYMPHSABS 3,581 07/16/2021 0844   LYMPHSABS 3.0 10/02/2015 1123   LYMPHSABS 3.0 03/29/2015 0501   MONOABS 568 02/03/2017 0000  MONOABS 0.6 03/29/2015 0501   EOSABS 193 07/16/2021 0844   EOSABS 0.2 10/02/2015 1123   EOSABS 0.2 03/29/2015 0501   BASOSABS 69 07/16/2021 0844   BASOSABS 0.0 10/02/2015 1123   BASOSABS 0.1 03/29/2015 0501    Hgb A1C Lab Results  Component Value Date    HGBA1C 5.8 (H) 07/16/2021           Assessment & Plan:   Conjunctivitis, Right Eye:  Rx for Polytrim 4 times a day for 5 days Continue warm compresses as needed  Candidal Intertrigo:  Liver function reviewed Rx for Diflucan 150 mg p.o. weekly x4 weeks Okay to continue use topical Ketoconazole as needed  Labial Itching:  Exam not performed today Likely lichen sclerosis Rx for clobetasol cream twice daily as needed  Diarrhea:  Likely related to diet,?  IBS Okay to take Imodium as needed  Anxiety and Depression:  Continue Fluoxetine We will add Wellbutrin  Advised her to follow-up with PCP in 1 months  Nicki Reaperegina Stoy Fenn, NP This visit occurred during the SARS-CoV-2 public health emergency.  Safety protocols were in place, including screening questions prior to the visit, additional usage of staff PPE, and extensive cleaning of exam room while observing appropriate contact time as indicated for disinfecting solutions.

## 2021-08-19 NOTE — Patient Instructions (Signed)
I have sent in Polytrim eyedrops for you to use 4 times daily for the next 5 days for your right eye irritation.  Please continue to use warm compresses.  You may also apply a very light coating of hydrocortisone cream to the red area next to your nasal bridge.  Please do not get any hydrocortisone cream in your eye though.  I have sent in an oral antifungal for you to take weekly for the next 4 weeks to help clear out the rash underneath her breast.  I have sent in a topical steroid cream that is very high potency for you to use in the vaginal area for itching.  Please use this twice daily until itching resolves and then you can use as needed thereafter.  You may take Imodium as needed for diarrhea.  This is safe to use with people in A. fib.  I have started you on Wellbutrin in addition to your Prozac for your mood.  Please follow-up with Dr. Althea Charon in 1 month and let him know how this is working for you.

## 2021-09-09 ENCOUNTER — Other Ambulatory Visit: Payer: Self-pay | Admitting: Family Medicine

## 2021-09-09 DIAGNOSIS — F419 Anxiety disorder, unspecified: Secondary | ICD-10-CM

## 2021-09-09 DIAGNOSIS — F32A Depression, unspecified: Secondary | ICD-10-CM

## 2021-09-09 MED ORDER — BUPROPION HCL ER (XL) 150 MG PO TB24: 150.0000 mg | ORAL_TABLET | Freq: Every day | ORAL | 0 refills | Status: DC

## 2021-09-09 NOTE — Telephone Encounter (Signed)
Copied from CRM [id number]. Topic: Quick Communication - Rx Refill/Question >> Sep 09, 2021  9:32 AM [name] wrote: Medication: buPROPion (WELLBUTRIN XL) 150 MG 24 hr tablet [[id number]]   Has the patient contacted their pharmacy? Yes.   (Agent: If no, request that the patient contact the pharmacy for the refill.) (Agent: If yes, when and what did the pharmacy advise?)  Preferred Pharmacy (with phone number or street name): [facility] - [place], [place] - [address]  Phone:  [phone] Fax:  [phone]   Agent: Please be advised that RX refills may take up to 3 business days. We ask that you follow-up with your pharmacy.

## 2021-09-09 NOTE — Telephone Encounter (Signed)
Requested medication (s) are due for refill today: Yes  Requested medication (s) are on the active medication list: Yes  Last refill:  08/19/21  Future visit scheduled: Yes  Notes to clinic:  Pt. Was told to follow up in 1 month after starting medication.    Requested Prescriptions  Pending Prescriptions Disp Refills   buPROPion (WELLBUTRIN XL) 150 MG 24 hr tablet 30 tablet 0    Sig: Take 1 tablet (150 mg total) by mouth daily.     Psychiatry: Antidepressants - bupropion Passed - 09/09/2021  9:36 AM      Passed - Completed PHQ-2 or PHQ-9 in the last 360 days      Passed - Last BP in normal range    BP Readings from Last 1 Encounters:  08/19/21 (!) 121/54          Passed - Valid encounter within last 6 months    Recent Outpatient Visits           3 weeks ago Acute bacterial conjunctivitis of right eye   [facility], Salvadore Oxford, NP   1 month ago Annual physical exam   [facility], Netta Neat, DO   2 months ago Strain of left trapezius muscle, initial encounter   [facility], Netta Neat, DO   7 months ago Pre-diabetes   [facility], [place]   11 months ago Acquired hypothyroidism   [facility] Althea Charon, Netta Neat, DO       Future Appointments             In 4 months Althea Charon, Netta Neat, DO [facility]   In 6 months  [facility], [place]

## 2021-10-15 ENCOUNTER — Telehealth: Payer: Self-pay | Admitting: Cardiovascular Disease

## 2021-10-15 MED ORDER — APIXABAN 5 MG PO TABS: 5.0000 mg | ORAL_TABLET | Freq: Two times a day (BID) | ORAL | 1 refills | Status: DC

## 2021-10-15 NOTE — Telephone Encounter (Signed)
?*  STAT* If patient is at the pharmacy, call can be transferred to refill team. ? ? ?1. Which medications need to be refilled? (please list name of each medication and dose if known) Eliquis 5 MG 1 tablet twice daily  ? ?2. Which pharmacy/location (including street and city if local pharmacy) is medication to be sent to? [facility]  ? ?3. Do they need a 30 day or 90 day supply? 90 day ? ?

## 2021-10-15 NOTE — Telephone Encounter (Signed)
Eliquis 5 mg refill request received. Patient is 87 years old, weight-68.1 kg, Crea- 0.82 on 07/16/21, Diagnosis-PAF, and last seen by Dr. Mariah Milling on 12/09/20. Dose is appropriate based on dosing criteria. Will send in refill to requested pharmacy.

## 2021-10-15 NOTE — Telephone Encounter (Signed)
Refill request

## 2021-10-22 ENCOUNTER — Telehealth: Payer: Self-pay

## 2021-10-22 NOTE — Telephone Encounter (Signed)
Copied from CRM [id number]. Topic: General - Other >> Oct 21, 2021  1:44 PM Pawlus, Maxine Glenn A wrote: Reason for CRM: Pt requested to speak directly with Fleet Contras regarding her medications, pt stated she will be going out of town and needed to make sure she had her meds.

## 2021-10-22 NOTE — Telephone Encounter (Signed)
The pt state that she never heard anything back from our office and now she have left to go on a trip. She said she will call back at a later time.

## 2021-11-11 ENCOUNTER — Telehealth: Payer: Self-pay

## 2021-11-11 DIAGNOSIS — F411 Generalized anxiety disorder: Secondary | ICD-10-CM

## 2021-11-11 DIAGNOSIS — F339 Major depressive disorder, recurrent, unspecified: Secondary | ICD-10-CM

## 2021-11-11 MED ORDER — FLUOXETINE HCL 40 MG PO CAPS: 40.0000 mg | ORAL_CAPSULE | Freq: Every day | ORAL | 0 refills | Status: DC

## 2021-11-11 NOTE — Telephone Encounter (Signed)
Copied from CRM [id number]. Topic: General - Inquiry >> Nov 11, 2021 11:18 AM Daphine Deutscher D wrote: Reason for CRM: Pt called saying she is in [place] and needs a rx refill  Fluoxitine 40 mg  2 months refill  [facility] [phone]   [address] [place] [address]  Pt's @ [phone]

## 2021-11-11 NOTE — Telephone Encounter (Signed)
Rx sent to [facility] she requested in [facility].  Please notify her to pick up. I sent 90 day but she can pick up only 60 pills if she prefers  Saralyn Pilar, DO [facility] 11/11/2021, 5:47 PM

## 2021-11-24 ENCOUNTER — Ambulatory Visit: Payer: Self-pay | Admitting: *Deleted

## 2021-11-24 DIAGNOSIS — M542 Cervicalgia: Secondary | ICD-10-CM

## 2021-11-24 NOTE — Telephone Encounter (Signed)
Per agent:  "PT called saying she has a crick in her neck for about 4 days.  Se is in [place].   She said she has had this before and D. Fisher sent something to the pharmacy.  She is wanting to see if he will send it to where she is now on vacation in [place].   [facility]  [facility]  [phone]   Pt's CB# [phone] "   Attempted to reach pt, left VM to CB.

## 2021-11-24 NOTE — Telephone Encounter (Signed)
Pt would like for Dr. Kirtland Bouchard to send her some of the same medicine that she got before for the last crick in her neck. She does not recall he name. She thinks it was a muscle relaxer.   Pt is currently in [place] visiting.   Please return call.      Reason for Disposition  [1] MODERATE neck pain (e.g., interferes with normal activities) AND [2] present > 3 days  Answer Assessment - Initial Assessment Questions 1. ONSET: "When did the pain begin?"      About 4 days ago 2. LOCATION: "Where does it hurt?"      Neck and head 3. PATTERN "Does the pain come and go, or has it been constant since it started?"      constant 4. SEVERITY: "How bad is the pain?"  (Scale 1-10; or mild, moderate, severe)   - NO PAIN (0): no pain or only slight stiffness    - MILD (1-3): doesn't interfere with normal activities    - MODERATE (4-7): interferes with normal activities or awakens from sleep    - SEVERE (8-10):  excruciating pain, unable to do any normal activities      4-5 5. RADIATION: "Does the pain go anywhere else, shoot into your arms?"     Head and neck 6. CORD SYMPTOMS: "Any weakness or numbness of the arms or legs?"     no 7. CAUSE: "What do you think is causing the neck pain?"     Unknown 8. NECK OVERUSE: "Any recent activities that involved turning or twisting the neck?"     no 9. OTHER SYMPTOMS: "Do you have any other symptoms?" (e.g., headache, fever, chest pain, difficulty breathing, neck swelling)     no 10. PREGNANCY: "Is there any chance you are pregnant?" "When was your last menstrual period?"       na  Protocols used: Neck Pain or Stiffness-A-AH

## 2021-11-25 MED ORDER — TIZANIDINE HCL 2 MG PO TABS: 2.0000 mg | ORAL_TABLET | Freq: Four times a day (QID) | ORAL | 0 refills | Status: DC | PRN

## 2021-11-25 NOTE — Telephone Encounter (Signed)
Spoke with the patient and she is aware that the prescription was sent.

## 2021-11-25 NOTE — Telephone Encounter (Signed)
Tizanidine sent to [facility] [place] [facility]  Saralyn Pilar, DO [facility] 11/25/2021, 12:04 PM

## 2021-12-03 IMAGING — CT CT CHEST W/O CM
2 of 4 series · 15 of 36 positions shown, 18 images · non-contrast
Comparison: Chest x-ray 02/24/2021

CLINICAL DATA: Pulmonary nodule.

EXAM:
CT CHEST WITHOUT CONTRAST
TECHNIQUE: Multidetector CT imaging of the chest was performed following the
standard protocol without IV contrast.

[Series 2: chest 2.00 · axial · 0.57mm/px · z∈[-1221,-937]mm · 12 of 168 slices shown, 15 images]
[im 13/168  mediastinal]
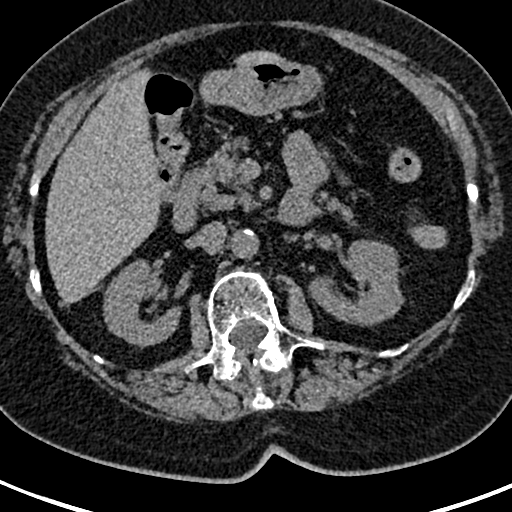
[im 13/168  lung]
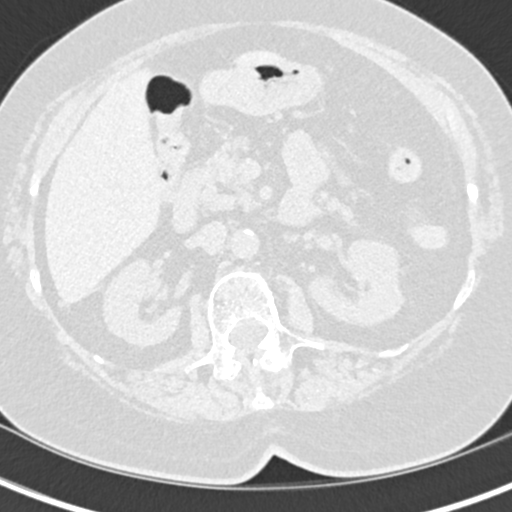
[im 26/168  lung]
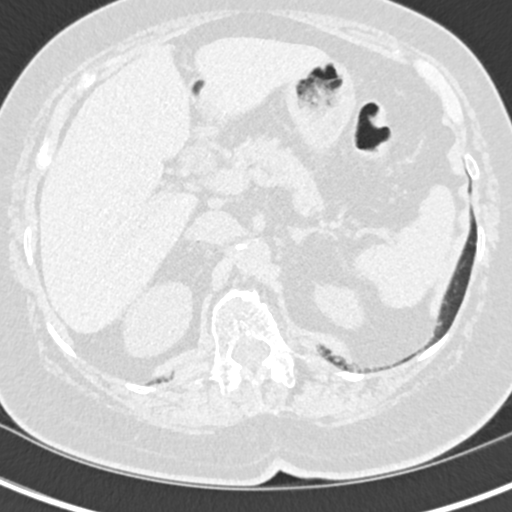
[im 39/168  lung]
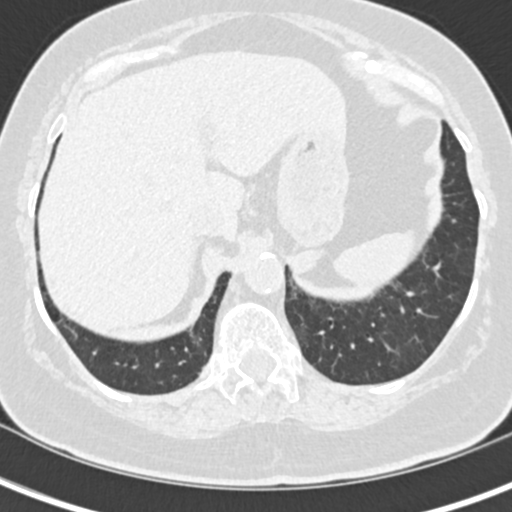
[im 52/168  lung]
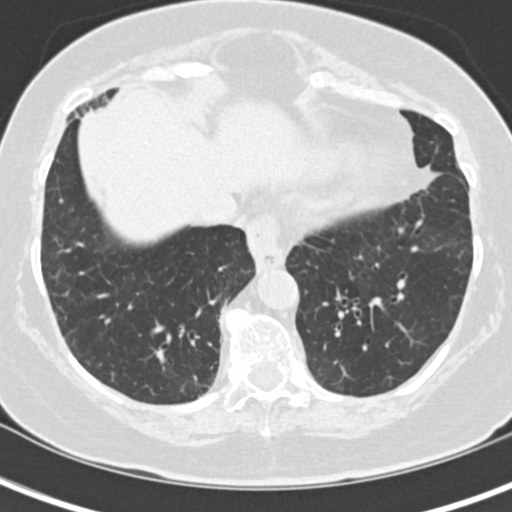
[im 65/168  mediastinal]
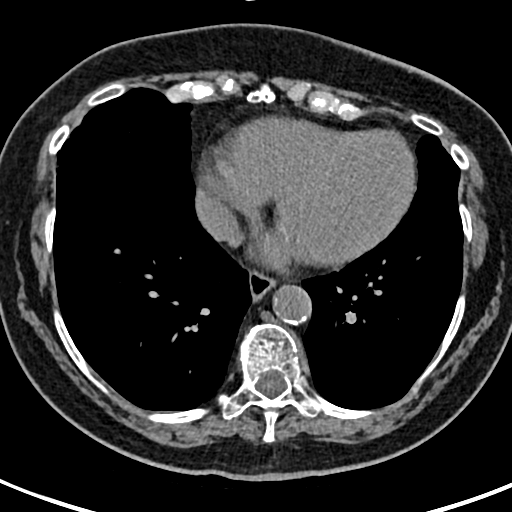
[im 65/168  lung]
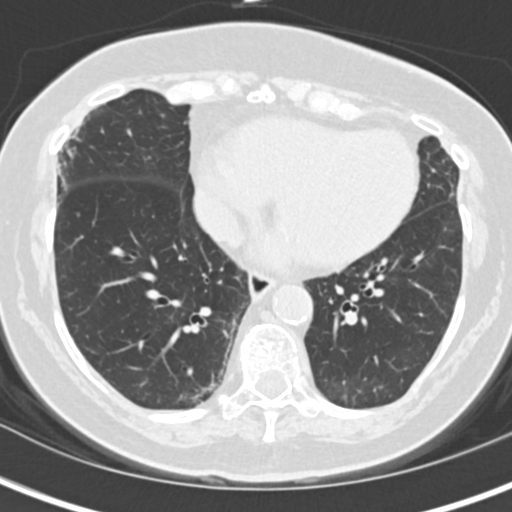
[im 78/168  lung]
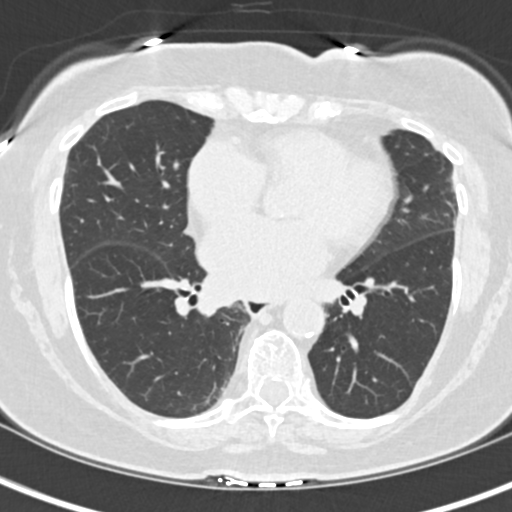
[im 90/168  lung]
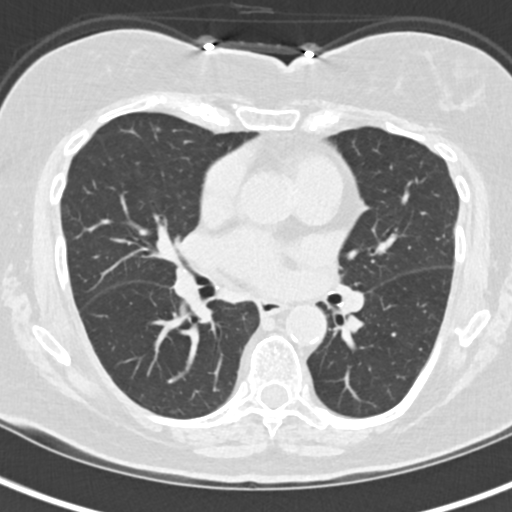
[im 103/168  lung]
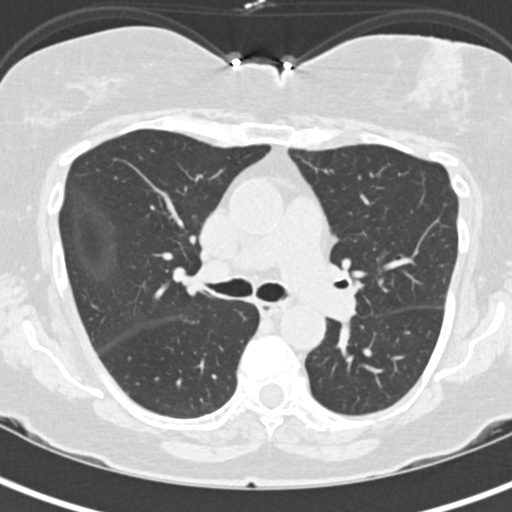
[im 116/168  mediastinal]
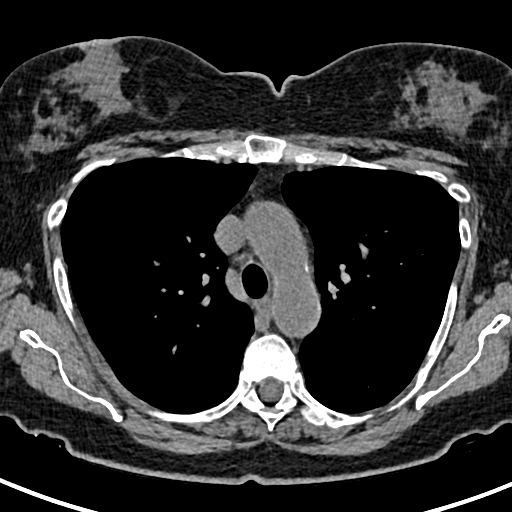
[im 116/168  lung]
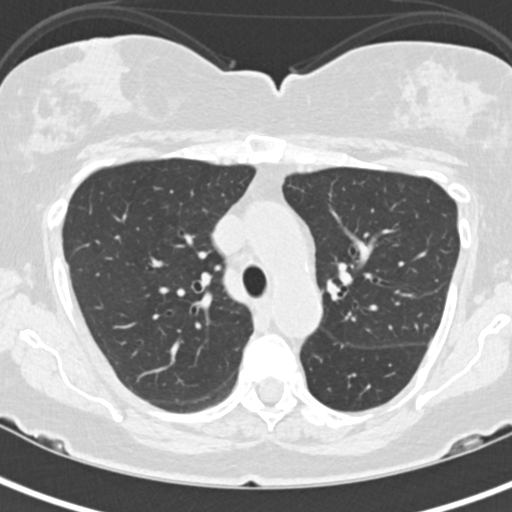
[im 129/168  lung]
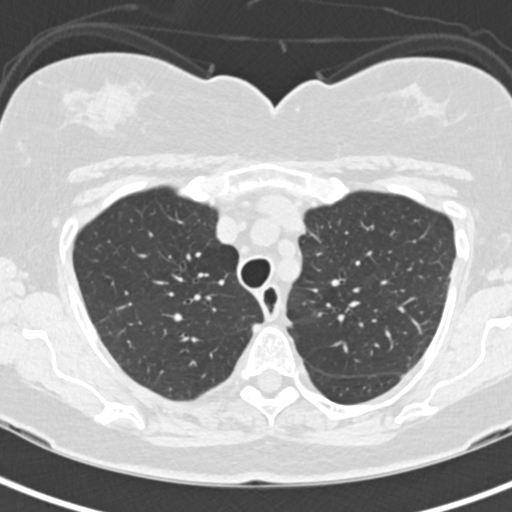
[im 142/168  lung]
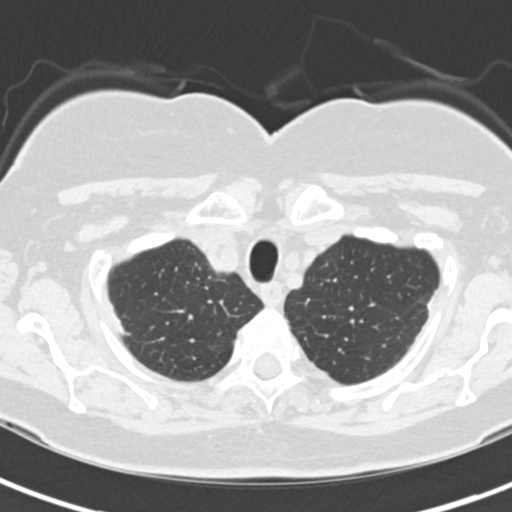
[im 155/168  lung]
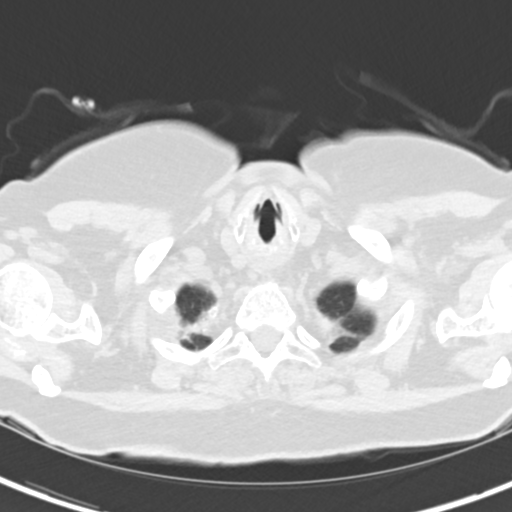

[Series 5: coronals chest 2.00 cor · coronal · 0.57mm/px · 3 of 140 slices shown]
[im 28/140  lung]
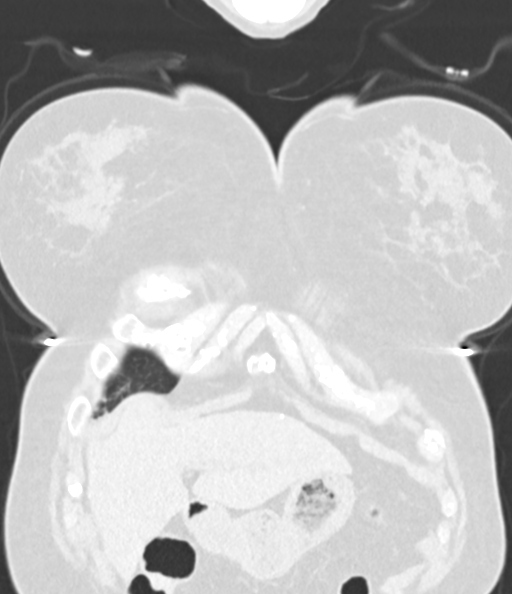
[im 56/140  lung]
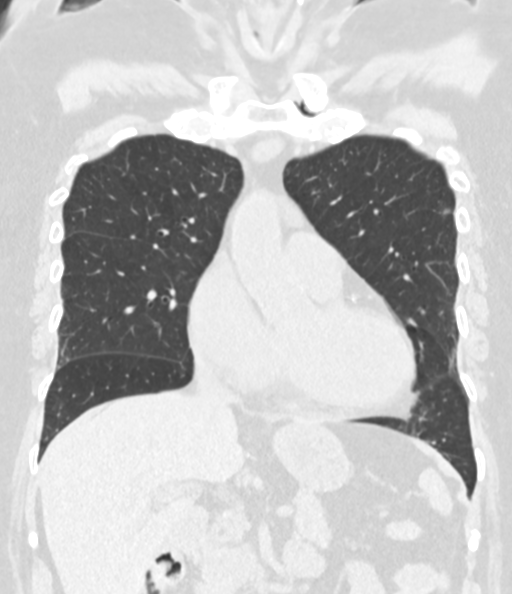
[im 84/140  lung]
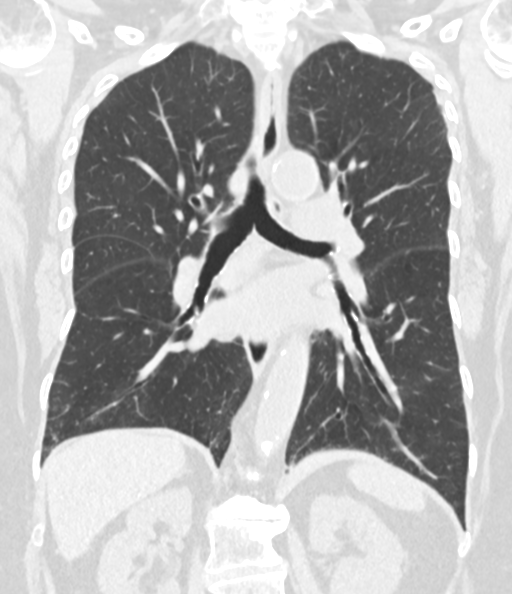

[15 of 36 positions shown; findings below may reference images not displayed]

FINDINGS: Cardiovascular: The heart size is normal. No substantial pericardial
effusion. Coronary artery calcification is evident. Atherosclerotic
calcification is noted in the wall of the thoracic aorta.

Mediastinum/Nodes: No mediastinal lymphadenopathy. No evidence for
gross hilar lymphadenopathy although assessment is limited by the
lack of intravenous contrast on today's study. The esophagus has
normal imaging features. There is no axillary lymphadenopathy.

Lungs/Pleura: Trace biapical pleuroparenchymal scarring evident. No
suspicious nodule or mass in the right lung as suggested on recent
chest x-ray. Tiny 4 mm ground-glass nodule identified posterior left
lower lobe on image 71/3 in a central 4 mm left upper lobe nodule is
visible on 66/3. Tiny focus of platelike atelectasis or scarring
noted anterior left upper lobe on 61/3. No focal airspace
consolidation. No pleural effusion.

Upper Abdomen: 3 mm nonobstructing stone noted upper pole left
kidney

Musculoskeletal: No worrisome lytic or sclerotic osseous
abnormality.
IMPRESSION: 1. No suspicious right pulmonary nodule as question on recent chest
x-ray.
2. Tiny left pulmonary nodules measuring up to maximum size of 4 mm.
No follow-up needed if patient is low-risk (and has no known or
suspected primary neoplasm). Non-contrast chest CT can be considered
in 12 months if patient is high-risk. This recommendation follows
the consensus statement: Guidelines for Management of Incidental
Pulmonary Nodules Detected on CT Images: From the [HOSPITAL]
3.  Aortic Atherosclerois ([id number])

## 2021-12-11 ENCOUNTER — Other Ambulatory Visit: Payer: Self-pay | Admitting: Family Medicine

## 2021-12-11 DIAGNOSIS — M797 Fibromyalgia: Secondary | ICD-10-CM

## 2021-12-11 DIAGNOSIS — M159 Polyosteoarthritis, unspecified: Secondary | ICD-10-CM

## 2021-12-11 DIAGNOSIS — E782 Mixed hyperlipidemia: Secondary | ICD-10-CM

## 2021-12-11 NOTE — Telephone Encounter (Signed)
Copied from CRM [id number]. Topic: Quick Communication - Rx Refill/Question >> Dec 11, 2021  2:46 PM Marylen Ponto wrote: Medication: gabapentin (NEURONTIN) 300 MG capsule and atorvastatin (LIPITOR) 80 MG tablet  Has the patient contacted their pharmacy? No. Pt is out of town (Agent: If no, request that the patient contact the pharmacy for the refill. If patient does not wish to contact the pharmacy document the reason why and proceed with request.) (Agent: If yes, when and what did the pharmacy advise?)  Preferred Pharmacy (with phone number or street name): [facility] [address] [place], [place] - [address]  Phone: [phone] Fax: [phone]  Has the patient been seen for an appointment in the last year OR does the patient have an upcoming appointment? Yes.    Agent: Please be advised that RX refills may take up to 3 business days. We ask that you follow-up with your pharmacy.

## 2021-12-12 MED ORDER — ATORVASTATIN CALCIUM 80 MG PO TABS: ORAL_TABLET | ORAL | 0 refills | Status: DC

## 2021-12-12 MED ORDER — GABAPENTIN 300 MG PO CAPS: ORAL_CAPSULE | ORAL | 0 refills | Status: DC

## 2021-12-12 NOTE — Telephone Encounter (Signed)
I called her today and confirmed she needs 90 day supply she is unsure when leaving [place]. But she will return.  Saralyn Pilar, DO [facility] 12/12/2021, 4:58 PM

## 2021-12-12 NOTE — Telephone Encounter (Signed)
Meds requested to pharmacy in [place] - can you call to confirm what she needs? 30 day or 90 day?  Saralyn Pilar, DO [facility] 12/12/2021, 1:18 PM

## 2021-12-12 NOTE — Telephone Encounter (Signed)
Requested medication (s) are on the active medication list: Neurontin has different signature/dose, Lipitor prescribed by provider not in this practice.  Future visit scheduled: 01/27/22  Notes to clinic:  Prescriber of Lipitor not in this practice, Neurontin with different dose/sig. Please assess.   Requested Prescriptions  Pending Prescriptions Disp Refills   atorvastatin (LIPITOR) 80 MG tablet 90 tablet 2    Sig: TAKE 1 TABLET EVERY DAY AT 6:00 PM     Cardiovascular:  Antilipid - Statins Passed - 12/11/2021  3:14 PM      Passed - Total Cholesterol in normal range and within 360 days    Cholesterol, Total  Date Value Ref Range Status  06/22/2019 267 (H) 100 - 199 mg/dL Final   Cholesterol  Date Value Ref Range Status  07/16/2021 141 <200 mg/dL Final          Passed - LDL in normal range and within 360 days    LDL Cholesterol (Calc)  Date Value Ref Range Status  07/16/2021 72 mg/dL (calc) Final    Comment:    Reference range: <100 . Desirable range <100 mg/dL for primary prevention;   <70 mg/dL for patients with CHD or diabetic patients  with > or = 2 CHD risk factors. Marland Kitchen LDL-C is now calculated using the Martin-Hopkins  calculation, which is a validated novel method providing  better accuracy than the Friedewald equation in the  estimation of LDL-C.  Cresenciano Genre et al. Annamaria Helling. MU:[id number]): 2061-2068  (http://education.[facility].com/faq/FAQ164)    LDL Direct  Date Value Ref Range Status  06/22/2019 196 (H) 0 - 99 mg/dL Final          Passed - HDL in normal range and within 360 days    HDL  Date Value Ref Range Status  07/16/2021 50 > OR = 50 mg/dL Final  06/22/2019 50 >39 mg/dL Final          Passed - Triglycerides in normal range and within 360 days    Triglycerides  Date Value Ref Range Status  07/16/2021 104 <150 mg/dL Final          Passed - Patient is not pregnant      Passed - Valid encounter within last 12 months    Recent Outpatient  Visits           3 months ago Acute bacterial conjunctivitis of right eye   [place], Coralie Keens, NP   4 months ago Annual physical exam   [facility] [place], Devonne Doughty, DO   5 months ago Strain of left trapezius muscle, initial encounter   [place], DO   10 months ago Pre-diabetes   [place], Devonne Doughty, DO   1 year ago Acquired hypothyroidism   [place], DO       Future Appointments             In 1 month [facility] [place]   In 3 months  [facility]             gabapentin (NEURONTIN) 300 MG capsule 120 capsule 0    Sig: Take 1 capsule (300mg ) in morning, take 1 cap (300mg ) in afternoon, and take 2 caps (600mg ) in evening.     Neurology: Anticonvulsants - gabapentin Passed - 12/11/2021  3:14 PM      Passed -  Valid encounter within last 12 months    Recent Outpatient Visits           3 months ago Acute bacterial conjunctivitis of right eye   [facility] [place], Salvadore [place], NP   4 months ago Annual physical exam   [facility] [place], Netta Neat, DO   5 months ago Strain of left trapezius muscle, initial encounter   [facility] Smitty Cords, DO   10 months ago Pre-diabetes   [facility] Smitty Cords, DO   1 year ago Acquired hypothyroidism   [facility] [place], Netta Neat, DO       Future Appointments             In 1 month [place], Netta Neat, DO [facility], [facility]   In 3 months  [facility], [place]

## 2022-01-22 ENCOUNTER — Ambulatory Visit: Payer: Self-pay

## 2022-01-22 DIAGNOSIS — N9089 Other specified noninflammatory disorders of vulva and perineum: Secondary | ICD-10-CM

## 2022-01-22 MED ORDER — CLOBETASOL PROPIONATE 0.05 % EX CREA: 1.0000 "application " | TOPICAL_CREAM | Freq: Two times a day (BID) | CUTANEOUS | 0 refills | Status: AC | PRN

## 2022-01-22 NOTE — Addendum Note (Signed)
Addended by: Smitty Cords on: 01/22/2022 04:51 PM   Modules accepted: Orders

## 2022-01-22 NOTE — Telephone Encounter (Signed)
°  Chief Complaint: med refill Symptoms: vaginal itching Frequency: out of town Pertinent Negatives: NA Disposition: [] ED /[] Urgent Care (no appt availability in office) / [] Appointment(In office/virtual)/ []  [facility]/ [] Home Care/ [] Refused Recommended Disposition /[] [place] [facility]/ [x]  Follow-up with PCP Additional Notes: Pt states she is in [facility] and forgot to bring her cream with her and needs it sent to [facility].    Reason for Disposition  [1] Caller has NON-URGENT medicine question about med that PCP prescribed AND [2] triager unable to answer question  Answer Assessment - Initial Assessment Questions 1. NAME of MEDICATION: "What medicine are you calling about?"     Temovate cream 2. QUESTION: "What is your question?" (e.g., double dose of medicine, side effect)     In [place] and didn't bring medication with her 3. PRESCRIBING HCP: "Who prescribed it?" Reason: if prescribed by specialist, call should be referred to that group.     , NP 4. SYMPTOMS: "Do you have any symptoms?"     itching  Protocols used: Medication Question Call-A-AH

## 2022-01-23 DIAGNOSIS — H43812 Vitreous degeneration, left eye: Secondary | ICD-10-CM | POA: Diagnosis not present

## 2022-01-23 DIAGNOSIS — H04123 Dry eye syndrome of bilateral lacrimal glands: Secondary | ICD-10-CM | POA: Diagnosis not present

## 2022-01-23 DIAGNOSIS — H3581 Retinal edema: Secondary | ICD-10-CM | POA: Diagnosis not present

## 2022-01-23 DIAGNOSIS — T1501XA Foreign body in cornea, right eye, initial encounter: Secondary | ICD-10-CM | POA: Diagnosis not present

## 2022-01-27 ENCOUNTER — Ambulatory Visit: Payer: Medicare PPO | Admitting: Family Medicine

## 2022-02-06 DIAGNOSIS — H3021 Posterior cyclitis, right eye: Secondary | ICD-10-CM | POA: Diagnosis not present

## 2022-02-06 DIAGNOSIS — H43812 Vitreous degeneration, left eye: Secondary | ICD-10-CM | POA: Diagnosis not present

## 2022-02-06 DIAGNOSIS — H35351 Cystoid macular degeneration, right eye: Secondary | ICD-10-CM | POA: Diagnosis not present

## 2022-02-09 ENCOUNTER — Other Ambulatory Visit: Payer: Self-pay | Admitting: Family Medicine

## 2022-02-09 DIAGNOSIS — F411 Generalized anxiety disorder: Secondary | ICD-10-CM

## 2022-02-09 DIAGNOSIS — F339 Major depressive disorder, recurrent, unspecified: Secondary | ICD-10-CM

## 2022-02-10 NOTE — Telephone Encounter (Signed)
Requested Prescriptions  Pending Prescriptions Disp Refills   FLUoxetine (PROZAC) 40 MG capsule [Pharmacy Med Name: FLUoxetine HCl 40 MG Oral Capsule] 90 capsule 0    Sig: Take 1 capsule by mouth once daily     Psychiatry:  Antidepressants - SSRI Passed - 02/09/2022  2:50 AM      Passed - Completed PHQ-2 or PHQ-9 in the last 360 days      Passed - Valid encounter within last 6 months    Recent Outpatient Visits          5 months ago Acute bacterial conjunctivitis of right eye   [place], Coralie Keens, NP   6 months ago Annual physical exam   [place], DO   7 months ago Strain of left trapezius muscle, initial encounter   [place], Devonne Doughty, DO   1 year ago [place], [place], DO   1 year ago Acquired hypothyroidism   [place], Devonne Doughty, DO      Future Appointments            In 1 month [facility]

## 2022-02-17 ENCOUNTER — Telehealth: Payer: Self-pay

## 2022-02-17 NOTE — Telephone Encounter (Signed)
I think this was sent to me in error

## 2022-02-17 NOTE — Telephone Encounter (Signed)
I called her back.  She just wanted to ask how do they get records to the new office.  I told her she can sign a release form at their office. And they can fax it to [place] as a medical records request.  Saralyn Pilar, DO [facility] 02/17/2022, 5:57 PM

## 2022-02-17 NOTE — Telephone Encounter (Signed)
Only meant for Dr. Kirtland Bouchard.

## 2022-02-17 NOTE — Telephone Encounter (Signed)
Copied from CRM [id number]. Topic: General - Other >> Feb 17, 2022 12:31 PM Stacey Duncan E wrote: Reason for CRM: Pt is currently in [place] and needs medications and blood work for an eye issue while she is there / she stated she has an appt tomorrow with an office in [place] to establish care / she needs to do this for now while she is in [facility] but still wants Dr. Kirtland Bouchard asa her PCP in [place] when she returns/ pts needs to speak with someone about this today before she attends the appt tomorrow / please advise asap

## 2022-02-18 DIAGNOSIS — E785 Hyperlipidemia, unspecified: Secondary | ICD-10-CM | POA: Diagnosis not present

## 2022-02-18 DIAGNOSIS — F32A Depression, unspecified: Secondary | ICD-10-CM | POA: Diagnosis not present

## 2022-02-18 DIAGNOSIS — M810 Age-related osteoporosis without current pathological fracture: Secondary | ICD-10-CM | POA: Diagnosis not present

## 2022-02-18 DIAGNOSIS — G609 Hereditary and idiopathic neuropathy, unspecified: Secondary | ICD-10-CM | POA: Diagnosis not present

## 2022-02-18 DIAGNOSIS — M159 Polyosteoarthritis, unspecified: Secondary | ICD-10-CM | POA: Diagnosis not present

## 2022-02-18 DIAGNOSIS — H359 Unspecified retinal disorder: Secondary | ICD-10-CM | POA: Diagnosis not present

## 2022-02-18 DIAGNOSIS — Z0000 Encounter for general adult medical examination without abnormal findings: Secondary | ICD-10-CM | POA: Diagnosis not present

## 2022-02-18 DIAGNOSIS — I4891 Unspecified atrial fibrillation: Secondary | ICD-10-CM | POA: Diagnosis not present

## 2022-02-18 DIAGNOSIS — E039 Hypothyroidism, unspecified: Secondary | ICD-10-CM | POA: Diagnosis not present

## 2022-02-22 ENCOUNTER — Other Ambulatory Visit: Payer: Self-pay | Admitting: Family Medicine

## 2022-02-22 DIAGNOSIS — F411 Generalized anxiety disorder: Secondary | ICD-10-CM

## 2022-02-22 DIAGNOSIS — F339 Major depressive disorder, recurrent, unspecified: Secondary | ICD-10-CM

## 2022-02-23 NOTE — Telephone Encounter (Signed)
Duplicate request

## 2022-03-07 ENCOUNTER — Other Ambulatory Visit: Payer: Self-pay | Admitting: Family Medicine

## 2022-03-07 DIAGNOSIS — M797 Fibromyalgia: Secondary | ICD-10-CM

## 2022-03-07 DIAGNOSIS — E782 Mixed hyperlipidemia: Secondary | ICD-10-CM

## 2022-03-07 DIAGNOSIS — M159 Polyosteoarthritis, unspecified: Secondary | ICD-10-CM

## 2022-03-09 NOTE — Telephone Encounter (Signed)
Requested Prescriptions  ?Pending Prescriptions Disp Refills  ?? gabapentin (NEURONTIN) 300 MG capsule [Pharmacy Med Name: Gabapentin 300 MG Oral Capsule] 360 capsule 1  ?  Sig: TAKE 1 CAPSULE BY MOUTH IN THE MORNING AND 1 AT NOON AND 2 IN THE EVENING  ?  ? Neurology: Anticonvulsants - gabapentin Passed - 03/07/2022  9:14 AM  ?  ?  Passed - Cr in normal range and within 360 days  ?  Creat  ?Date Value Ref Range Status  ?07/16/2021 0.82 0.60 - 0.95 mg/dL Final  ?   ?  ?  Passed - Completed PHQ-2 or PHQ-9 in the last 360 days  ?  ?  Passed - Valid encounter within last 12 months  ?  Recent Outpatient Visits   ?      ? 6 months ago Acute bacterial conjunctivitis of right eye  ? [facility] [place], Salvadore Oxford, NP  ? 7 months ago Annual physical exam  ? [facility] Smitty Cords, DO  ? 8 months ago Strain of left trapezius muscle, initial encounter  ? [facility] [place], Netta Neat, DO  ? 1 year ago Pre-diabetes  ? [facility] [place] [place], Netta Neat, DO  ? 1 year ago Acquired hypothyroidism  ? [facility] [place], Netta Neat, DO  ?  ?  ? ?  ?  ?  ?? atorvastatin (LIPITOR) 80 MG tablet [Pharmacy Med Name: Atorvastatin Calcium 80 MG Oral Tablet] 90 tablet 1  ?  Sig: TAKE 1 TABLET BY MOUTH ONCE DAILY AT  6PM  ?  ? Cardiovascular:  Antilipid - Statins Failed - 03/07/2022  9:14 AM  ?  ?  Failed - Lipid Panel in normal range within the last 12 months  ?  Cholesterol, Total  ?Date Value Ref Range Status  ?06/22/2019 267 (H) 100 - 199 mg/dL Final  ? ?Cholesterol  ?Date Value Ref Range Status  ?07/16/2021 141 <200 mg/dL Final  ? ?LDL Cholesterol (Calc)  ?Date Value Ref Range Status  ?07/16/2021 72 mg/dL (calc) Final  ?  Comment:  ?  Reference range: <100 ?Marland Kitchen ?Desirable range <100 mg/dL for primary prevention;   ?<70 mg/dL for patients with CHD or diabetic patients  ?with > or = 2 CHD risk factors. ?. ?LDL-C is now calculated using  the Martin-Hopkins  ?calculation, which is a validated novel method providing  ?better accuracy than the Friedewald equation in the  ?estimation of LDL-C.  ?Horald Pollen et al. Lenox Ahr. 2130;865(78): 2061-2068  ?(http://education.[facility].com/faq/FAQ164) ?  ? ?LDL Direct  ?Date Value Ref Range Status  ?06/22/2019 196 (H) 0 - 99 mg/dL Final  ? ?HDL  ?Date Value Ref Range Status  ?07/16/2021 50 > OR = 50 mg/dL Final  ?46/96/2952 50 >84 mg/dL Final  ? ?Triglycerides  ?Date Value Ref Range Status  ?07/16/2021 104 <150 mg/dL Final  ? ?  ?  ?  Passed - Patient is not pregnant  ?  ?  Passed - Valid encounter within last 12 months  ?  Recent Outpatient Visits   ?      ? 6 months ago Acute bacterial conjunctivitis of right eye  ? [facility] [place], Salvadore Oxford, NP  ? 7 months ago Annual physical exam  ? [facility] Smitty Cords, DO  ? 8 months ago Strain of left trapezius muscle, initial encounter  ? [facility] [place], Netta Neat, DO  ?  1 year ago Pre-diabetes  ? [facility] [place], Netta Neat, DO  ? 1 year ago Acquired hypothyroidism  ? [facility] Smitty Cords, DO  ?  ?  ? ?  ?  ?  ? ?

## 2022-03-24 ENCOUNTER — Ambulatory Visit: Payer: Medicare PPO

## 2022-03-28 ENCOUNTER — Other Ambulatory Visit: Payer: Self-pay | Admitting: Cardiovascular Disease

## 2022-03-30 NOTE — Telephone Encounter (Signed)
Attempted to schedule.  

## 2022-03-30 NOTE — Telephone Encounter (Signed)
Patient declined for now as she Is in [place] and will call when returns.  Closing this encounter.  ?

## 2022-03-30 NOTE — Telephone Encounter (Signed)
Please schedule overdue F/U appointment for refills. Thank you! 

## 2022-05-09 ENCOUNTER — Other Ambulatory Visit: Payer: Self-pay | Admitting: Family Medicine

## 2022-05-09 DIAGNOSIS — F339 Major depressive disorder, recurrent, unspecified: Secondary | ICD-10-CM

## 2022-05-09 DIAGNOSIS — F411 Generalized anxiety disorder: Secondary | ICD-10-CM

## 2022-05-12 NOTE — Telephone Encounter (Signed)
Courtesy refill. Patient will need an office visit for further refills. ?Requested Prescriptions  ?Pending Prescriptions Disp Refills  ?? FLUoxetine (PROZAC) 40 MG capsule [Pharmacy Med Name: FLUoxetine HCl 40 MG Oral Capsule] 30 capsule 0  ?  Sig: Take 1 capsule by mouth once daily  ?  ? Psychiatry:  Antidepressants - SSRI Failed - 05/09/2022 11:10 AM  ?  ?  Failed - Valid encounter within last 6 months  ?  Recent Outpatient Visits   ?      ? 8 months ago Acute bacterial conjunctivitis of right eye  ? [facility] [place], Salvadore Oxford, NP  ? 9 months ago Annual physical exam  ? [facility] Smitty Cords, DO  ? 10 months ago Strain of left trapezius muscle, initial encounter  ? [facility] [place], Netta Neat, DO  ? 1 year ago Pre-diabetes  ? [facility] [place], Netta Neat, DO  ? 1 year ago Acquired hypothyroidism  ? [facility] Smitty Cords, DO  ?  ?  ? ?  ?  ?  Passed - Completed PHQ-2 or PHQ-9 in the last 360 days  ?  ?  ? ?

## 2022-06-08 ENCOUNTER — Telehealth: Payer: Self-pay | Admitting: Cardiovascular Disease

## 2022-06-08 NOTE — Telephone Encounter (Signed)
3 attempts to schedule fu appt from recall list.   Deleting recall.   

## 2022-07-30 DIAGNOSIS — I679 Cerebrovascular disease, unspecified: Secondary | ICD-10-CM | POA: Diagnosis not present

## 2022-08-06 ENCOUNTER — Telehealth: Payer: Self-pay | Admitting: Family Medicine

## 2022-08-06 NOTE — Telephone Encounter (Signed)
N/A unable to leave a message for patient to call back and schedule the Medicare Annual Wellness Visit (AWV) in office, virtually or by telephone.  Last AWV 03/18/21  Please schedule at anytime with [facility] Advisor.    Any questions, please call me at [phone]

## 2022-08-26 ENCOUNTER — Telehealth: Payer: Self-pay | Admitting: Cardiovascular Disease

## 2022-08-26 NOTE — Telephone Encounter (Signed)
I called ands spoke with the patient regarding her Eliquis refill. I have advised her that since she has not been seen since 11/2020, that we will need to see her in the office to continue to refill her medication.  The patient's daughter got on the phone and advised that her doctor in [place] was issued 11 refills for her on eliquis, so they will contact [facility] in [place] and have the RX transferred to [facility] in [place].  I advised that would be fine, therefore we will not refill at this time.  However, since the patient and her husband Sherilyn Cooter) are both overdue for follow up, they would like to go ahead and schedule same day appointments for each of them  I have offered 09/14/22 at 2:20 pm & 2:45 pm for Mr. & Mrs. Dullea.   The patient's daughter is agreeable.

## 2022-08-26 NOTE — Telephone Encounter (Signed)
*  STAT* If patient is at the pharmacy, call can be transferred to refill team.   1. Which medications need to be refilled? (please list name of each medication and dose if known) apixaban (ELIQUIS) 5 MG TABS tablet  2. Which pharmacy/location (including street and city if local pharmacy) is medication to be sent to? [facility] [place] - [address]  3. Do they need a 30 day or 90 day supply? 30   Pt states she was having this sent to [facility] pharmacy for several months, but is back home and only has a week left of this medication. Requesting refill.

## 2022-09-01 DIAGNOSIS — Z0100 Encounter for examination of eyes and vision without abnormal findings: Secondary | ICD-10-CM | POA: Diagnosis not present

## 2022-09-01 DIAGNOSIS — Z961 Presence of intraocular lens: Secondary | ICD-10-CM | POA: Diagnosis not present

## 2022-09-08 ENCOUNTER — Other Ambulatory Visit: Payer: Self-pay | Admitting: *Deleted

## 2022-09-08 DIAGNOSIS — I4891 Unspecified atrial fibrillation: Secondary | ICD-10-CM

## 2022-09-08 MED ORDER — METOPROLOL TARTRATE 25 MG PO TABS: ORAL_TABLET | ORAL | 0 refills | Status: DC

## 2022-09-09 ENCOUNTER — Other Ambulatory Visit: Payer: Self-pay | Admitting: Family Medicine

## 2022-09-09 DIAGNOSIS — E032 Hypothyroidism due to medicaments and other exogenous substances: Secondary | ICD-10-CM

## 2022-09-09 MED ORDER — LEVOTHYROXINE SODIUM 112 MCG PO TABS: 112.0000 ug | ORAL_TABLET | Freq: Every day | ORAL | 3 refills | Status: DC

## 2022-09-11 NOTE — Progress Notes (Unsigned)
Cardiology Office Note    Date:  09/14/2022   ID:  Stacey Duncan, DOB 1931-08-08, MRN [id number]  PCP:  Smitty Cords, DO  Cardiologist:  Julien Nordmann, MD  Electrophysiologist:  None   Chief Complaint: Follow-up  History of Present Illness:   Stacey Duncan is a 88 y.o. female with history of PAF on apixaban, HTN, HLD, fibromyalgia, hypothyroidism, and GERD who presents for follow-up of A-fib.  She was diagnosed with A-fib in 02/2015, converting with IV amiodarone with recurrence in 09/2017 with pharmacological cardioversion again with amiodarone.  Echo in 2017 showed an EF of 60% with no significant valvular abnormalities.  Zio patch in 2020 showed predominantly sinus rhythm with 8 runs of SVT/atrial tachycardia with the fastest lasting 4 beats at a rate of 174 bpm and the longest episode lasting 14 beats.  Triggered events were not associated with significant arrhythmia.  Escalation of beta-blocker has previously been limited by sinus bradycardia.  She was last seen in the office in 11/2020, and was without symptoms of angina or decompensation.  She was taking an additional metoprolol approximately once every 3 months for breakthrough palpitations.  She comes in doing reasonably well from a cardiac perspective.  She indicates she is under increased stress at home.  She also notes longstanding fatigue and some shortness of breath that has been present for approximately 1 year.  She is without symptoms of angina or decompensation.  She is currently wearing a walking boot along her left foot after dropping something on it while dusting her furniture.  She is also being evaluated for peripheral neuropathy.  No symptoms concerning for claudication.  No lower extremity swelling or orthopnea.  No early satiety.  No falls or symptoms concerning for bleeding.   Labs independently reviewed: 02/2022 -TSH normal, TC 158, TG 91, HDL 57, LDL 82, BUN 12, serum creatinine 0.92, potassium  4.1, albumin 4.0, AST/ALT normal, Hgb 12.9, PLT 280, magnesium 2.0 06/2021 - TSH 0.32, free T4 normal, BUN 14, serum creatinine 0.82, potassium 4.4, albumin 4.0, AST/ALT normal, Hgb 13.0, PLT 280, A1c 5.8, TC 141, TG 104, HDL 50, LDL 72  Past Medical History:  Diagnosis Date   Anxiety    Fibromyalgia    GERD (gastroesophageal reflux disease)    History of echocardiogram    a. 02/2016 Echo: EF 60-65%, no rwma. Nl RV fxn and PASP.   HTN (hypertension)    Hypothyroidism    Mixed hyperlipidemia    Osteoarthritis    a. bilateral knees   PAF (paroxysmal atrial fibrillation) (HCC)    a. 02/2015 & 09/2017 - admit w/ Afib, briefly req Amio; b. CHA2DSVASc = 4-->Eliquis.   Recurrent UTI (urinary tract infection)    Sinus bradycardia    a. Has limited beta blocker dosing.    Past Surgical History:  Procedure Laterality Date   ABDOMINAL HYSTERECTOMY     CHOLECYSTECTOMY     MOHS SURGERY Right    leg   ROTATOR CUFF REPAIR     left     Current Medications: Current Meds  Medication Sig   apixaban (ELIQUIS) 5 MG TABS tablet Take 1 tablet (5 mg total) by mouth 2 (two) times daily.   atorvastatin (LIPITOR) 80 MG tablet TAKE 1 TABLET BY MOUTH ONCE DAILY AT  6PM   buPROPion (WELLBUTRIN XL) 150 MG 24 hr tablet Take 1 tablet (150 mg total) by mouth daily.   Cholecalciferol 25 MCG (1000 UT) tablet Take 1,000 Units by mouth  daily.    clobetasol cream (TEMOVATE) 0.05 % Apply 1 application topically 2 (two) times daily as needed (skin irritation).   fluconazole (DIFLUCAN) 150 MG tablet Take 1 tablet (150 mg total) by mouth once a week.   FLUoxetine (PROZAC) 40 MG capsule Take 1 capsule by mouth once daily   gabapentin (NEURONTIN) 300 MG capsule TAKE 1 CAPSULE BY MOUTH IN THE MORNING AND 1 AT NOON AND 2 IN THE EVENING   hydrALAZINE (APRESOLINE) 25 MG tablet TAKE 1 TABLET TWICE DAILY   levothyroxine (SYNTHROID) 112 MCG tablet Take 1 tablet (112 mcg total) by mouth daily before breakfast.   metoprolol  tartrate (LOPRESSOR) 25 MG tablet TAKE 1/2 TABLET TWICE A DAY. CONTACT OFFICE FOR APPOINTMENT [phone]. Please keep upcoming appointment in September 2023 for future refills.  No more refills until appointment. Thank you   omeprazole (PRILOSEC) 40 MG capsule Take 1 capsule (40 mg total) by mouth daily as needed.   tiZANidine (ZANAFLEX) 2 MG tablet Take 1 tablet (2 mg total) by mouth every 6 (six) hours as needed for muscle spasms.    Allergies:   Sulfa antibiotics and Sulfacetamide sodium   Social History   Socioeconomic History   Marital status: Married    Spouse name: Not on file   Number of children: Not on file   Years of education: Not on file   Highest education level: Bachelor's degree (e.g., BA, AB, BS)  Occupational History   Occupation: retired  Tobacco Use   Smoking status: Never   Smokeless tobacco: Never  Vaping Use   Vaping Use: Never used  Substance and Sexual Activity   Alcohol use: Yes    Alcohol/week: 7.0 standard drinks of alcohol    Types: 7 Glasses of wine per week    Comment: glass of wine daily   Drug use: No   Sexual activity: Not Currently    Birth control/protection: Post-menopausal  Other Topics Concern   Not on file  Social History Narrative   Not on file   Social Determinants of Health   Financial Resource Strain: Low Risk  (03/18/2021)   Overall Financial Resource Strain (CARDIA)    Difficulty of Paying Living Expenses: Not hard at all  Food Insecurity: No Food Insecurity (03/18/2021)   Hunger Vital Sign    Worried About Running Out of Food in the Last Year: Never true    Ran Out of Food in the Last Year: Never true  Transportation Needs: No Transportation Needs (03/18/2021)   PRAPARE - Administrator, Civil Service (Medical): No    Lack of Transportation (Non-Medical): No  Physical Activity: Inactive (03/18/2021)   Exercise Vital Sign    Days of Exercise per Week: 0 days    Minutes of Exercise per Session: 0 min  Stress:  Stress Concern Present (03/18/2021)   [employer] of Occupational Health - Occupational Stress Questionnaire    Feeling of Stress : To some extent  Social Connections: Moderately Integrated (07/12/2018)   Social Connection and Isolation Panel [NHANES]    Frequency of Communication with Friends and Family: More than three times a week    Frequency of Social Gatherings with Friends and Family: More than three times a week    Attends Religious Services: More than 4 times per year    Active Member of [employer] or Organizations: No    Attends Banker Meetings: Never    Marital Status: Married     Family History:  The patient's  family history includes Heart attack in her maternal grandfather and maternal grandmother; Heart disease (age of onset: 37) in her mother.  ROS:   12-point review of systems is negative unless otherwise noted in the HPI.   EKGs/Labs/Other Studies Reviewed:    Studies reviewed were summarized above. The additional studies were reviewed today:  Zio patch 05/2019: Normal sinus rhythm Avg HR of 70 bpm.    Predominant underlying rhythm was Sinus Rhythm.    8 Supraventricular Tachycardia/atrial tachycardia runs occurred, the run with the fastest interval lasting 4 beats with a max rate of 174 bpm,  the longest lasting 14 beats with an avg rate of 135 bpm.     Isolated SVEs were rare (<1.0%), SVE Couplets were rare (<1.0%), and SVE Triplets were rare (<1.0%). Isolated VEs were rare (<1.0%, 645), VE Couplets were rare (<1.0%, 1), and VE Triplets were rare (<1.0%, 1).  Ventricular Bigeminy and Trigeminy were present.   Patient triggered event was not associated with significant arrhythmia. __________  2D echo 03/13/2016:- Procedure narrative: Transthoracic echocardiography. Image    quality was poor. The study was technically difficult, as a    result of poor sound wave transmission.  - Left ventricle: The cavity size was normal. Systolic function was     normal. The estimated ejection fraction was in the range of 60%    to 65%. Wall motion was normal; there were no regional wall    motion abnormalities. Left ventricular diastolic function    parameters were normal.  - Right ventricle: Systolic function was normal.  - Pulmonary arteries: Systolic pressure was within the normal    range.   Impressions:   - Normal study.   EKG:  EKG is ordered today.  The EKG ordered today demonstrates NSR, 63 bpm, baseline wandering, poor R wave progression along the precordial leads, nonspecific anterior ST-T changes versus lead placement  Recent Labs: 09/14/2022: BUN 15; Creatinine, Ser 0.84; Hemoglobin 12.4; Platelets 255; Potassium 4.1; Sodium 141; TSH 0.445  Recent Lipid Panel    Component Value Date/Time   CHOL 141 07/16/2021 0844   CHOL 267 (H) 06/22/2019 1424   TRIG 104 07/16/2021 0844   HDL 50 07/16/2021 0844   HDL 50 06/22/2019 1424   CHOLHDL 2.8 07/16/2021 0844   VLDL 28 02/03/2017 0000   LDLCALC 72 07/16/2021 0844   LDLDIRECT 196 (H) 06/22/2019 1424    PHYSICAL EXAM:    VS:  BP 120/60 (BP Location: Left Arm, Patient Position: Sitting, Cuff Size: Normal)   Pulse 63   Ht 5\' 3"  (1.6 m)   Wt 150 lb 8 oz (68.3 kg)   SpO2 98%   BMI 26.66 kg/m   BMI: Body mass index is 26.66 kg/m.  Physical Exam Vitals reviewed.  Constitutional:      Appearance: She is well-developed.  HENT:     Head: Normocephalic and atraumatic.  Eyes:     General:        Right eye: No discharge.        Left eye: No discharge.  Neck:     Vascular: No JVD.  Cardiovascular:     Rate and Rhythm: Normal rate and regular rhythm.     Pulses:          Dorsalis pedis pulses are 2+ on the right side.       Posterior tibial pulses are 2+ on the right side.     Heart sounds: Normal heart sounds, S1 normal and S2 normal. Heart  sounds not distant. No midsystolic click and no opening snap. No murmur heard.    No friction rub.  Pulmonary:     Effort: Pulmonary  effort is normal. No respiratory distress.     Breath sounds: Normal breath sounds. No decreased breath sounds, wheezing or rales.  Chest:     Chest wall: No tenderness.  Abdominal:     General: There is no distension.  Musculoskeletal:     Cervical back: Normal range of motion.     Right lower leg: No edema.     Left lower leg: No edema.     Comments: Walking boot noted along the left foot.  Skin:    General: Skin is warm and dry.     Nails: There is no clubbing.  Neurological:     Mental Status: She is alert and oriented to person, place, and time.  Psychiatric:        Speech: Speech normal.        Behavior: Behavior normal.        Thought Content: Thought content normal.        Judgment: Judgment normal.     Wt Readings from Last 3 Encounters:  09/14/22 150 lb 8 oz (68.3 kg)  08/19/21 150 lb 3.2 oz (68.1 kg)  07/23/21 150 lb 12.8 oz (68.4 kg)     ASSESSMENT & PLAN:   PAF: Maintaining sinus rhythm with low-dose metoprolol tartrate.  CHA2DS2-VASc at least 4 (HTN, age x2, sex category).  She remains on apixaban 5 mg twice daily, and does not currently meet reduced dosing criteria.  No symptoms concerning for bleeding.  Update echo.  Check CBC, TSH, and BMP.  HTN: Blood pressure is well controlled in the office today.  She remains on hydralazine and Lopressor.  HLD: LDL 82 in 02/2022.  She remains on atorvastatin 80 mg    Disposition: F/u with Dr. Mariah Milling or an APP in 6 months.   Medication Adjustments/Labs and Tests Ordered: Current medicines are reviewed at length with the patient today.  Concerns regarding medicines are outlined above. Medication changes, Labs and Tests ordered today are summarized above and listed in the Patient Instructions accessible in Encounters.   Signed, Eula Listen, PA-C 09/14/2022 4:42 PM     [facility] -  [address] [place], [place] [address] [phone]

## 2022-09-12 DIAGNOSIS — S9032XA Contusion of left foot, initial encounter: Secondary | ICD-10-CM | POA: Diagnosis not present

## 2022-09-14 ENCOUNTER — Ambulatory Visit: Payer: Medicare PPO | Attending: Physician Assistant | Admitting: Physician Assistant

## 2022-09-14 ENCOUNTER — Other Ambulatory Visit
Admission: RE | Admit: 2022-09-14 | Discharge: 2022-09-14 | Disposition: A | Discharge status: Home / Self Care | Payer: Medicare PPO | Source: Ambulatory Visit | Attending: Physician Assistant | Admitting: Physician Assistant

## 2022-09-14 ENCOUNTER — Encounter: Payer: Self-pay | Admitting: *Deleted

## 2022-09-14 ENCOUNTER — Encounter: Payer: Self-pay | Admitting: Physician Assistant

## 2022-09-14 VITALS — BP 120/60 | HR 63 | Ht 63.0 in | Wt 150.5 lb

## 2022-09-14 DIAGNOSIS — I10 Essential (primary) hypertension: Secondary | ICD-10-CM

## 2022-09-14 DIAGNOSIS — E782 Mixed hyperlipidemia: Secondary | ICD-10-CM | POA: Diagnosis not present

## 2022-09-14 DIAGNOSIS — I480 Paroxysmal atrial fibrillation: Secondary | ICD-10-CM | POA: Insufficient documentation

## 2022-09-14 DIAGNOSIS — R0602 Shortness of breath: Secondary | ICD-10-CM

## 2022-09-14 LAB — CBC
HCT: 38.4 % (ref 36.0–46.0)
Hemoglobin: 12.4 g/dL (ref 12.0–15.0)
MCH: 29.7 pg (ref 26.0–34.0)
MCHC: 32.3 g/dL (ref 30.0–36.0)
MCV: 92.1 fL (ref 80.0–100.0)
Platelets: 255 10*3/uL (ref 150–400)
RBC: 4.17 MIL/uL (ref 3.87–5.11)
RDW: 14.3 % (ref 11.5–15.5)
WBC: 8.8 10*3/uL (ref 4.0–10.5)
nRBC: 0 % (ref 0.0–0.2)

## 2022-09-14 LAB — BASIC METABOLIC PANEL
Anion gap: 9 (ref 5–15)
BUN: 15 mg/dL (ref 8–23)
CO2: 26 mmol/L (ref 22–32)
Calcium: 8.9 mg/dL (ref 8.9–10.3)
Chloride: 106 mmol/L (ref 98–111)
Creatinine, Ser: 0.84 mg/dL (ref 0.44–1.00)
GFR, Estimated: >60 mL/min (ref >60)
Glucose, Bld: 100 mg/dL — ABNORMAL HIGH (ref 70–99)
Potassium: 4.1 mmol/L (ref 3.5–5.1)
Sodium: 141 mmol/L (ref 135–145)

## 2022-09-14 LAB — TSH: TSH: 0.445 u[IU]/mL (ref 0.350–4.500)

## 2022-09-14 NOTE — Patient Instructions (Signed)
Medication Instructions:   Your physician recommends that you continue on your current medications as directed. Please refer to the Current Medication list given to you today.  *If you need a refill on your cardiac medications before your next appointment, please call your pharmacy*   Lab Work:  Please go to the [facility] after your appointment today for a lab draw (CBC, BMP, TSH).   Testing/Procedures:  Your physician has requested that you have an echocardiogram. Echocardiography is a painless test that uses sound waves to create images of your heart. It provides your doctor with information about the size and shape of your heart and how well your heart's chambers and valves are working. This procedure takes approximately one hour. There are no restrictions for this procedure.   Follow-Up: At [facility], you and your health needs are our priority.  As part of our continuing mission to provide you with exceptional heart care, we have created designated Provider Care Teams.  These Care Teams include your primary Cardiologist (physician) and Advanced Practice Providers (APPs -  Physician Assistants and Nurse Practitioners) who all work together to provide you with the care you need, when you need it.  We recommend signing up for the patient portal called "MyChart".  Sign up information is provided on this After Visit Summary.  MyChart is used to connect with patients for Virtual Visits (Telemedicine).  Patients are able to view lab/test results, encounter notes, upcoming appointments, etc.  Non-urgent messages can be sent to your provider as well.   To learn more about what you can do with MyChart, go to [email or web address].    Your next appointment:   6 month(s)  The format for your next appointment:   In Person  Provider:   You may see Ida Rogue, MD or one of the following Advanced Practice Providers on your designated Care Team:   Murray Hodgkins, NP Christell Faith, PA-C Cadence Kathlen Mody, PA-C Gerrie Nordmann, NP    Other Instructions   Important Information About Sugar

## 2022-09-18 DIAGNOSIS — S9030XA Contusion of unspecified foot, initial encounter: Secondary | ICD-10-CM | POA: Diagnosis not present

## 2022-10-21 ENCOUNTER — Ambulatory Visit: Payer: Medicare PPO | Attending: Physician Assistant

## 2022-10-21 DIAGNOSIS — R0602 Shortness of breath: Secondary | ICD-10-CM

## 2022-10-21 DIAGNOSIS — I480 Paroxysmal atrial fibrillation: Secondary | ICD-10-CM

## 2022-10-21 LAB — ECHOCARDIOGRAM COMPLETE
AR max vel: 2.56 cm2
AV Area VTI: 2.68 cm2
AV Area mean vel: 3.33 cm2
AV Mean grad: 2 mmHg
AV Peak grad: 4.8 mmHg
Ao pk vel: 1.09 m/s
Area-P 1/2: 5.6 cm2
S' Lateral: 2.1 cm

## 2022-10-21 MED ORDER — PERFLUTREN LIPID MICROSPHERE
1.0000 mL | INTRAVENOUS | Status: AC | PRN
  Administered 2022-10-21: 2 mL via INTRAVENOUS

## 2022-10-22 ENCOUNTER — Telehealth: Payer: Self-pay | Admitting: *Deleted

## 2022-10-22 DIAGNOSIS — R0602 Shortness of breath: Secondary | ICD-10-CM

## 2022-10-22 DIAGNOSIS — I480 Paroxysmal atrial fibrillation: Secondary | ICD-10-CM

## 2022-10-22 NOTE — Telephone Encounter (Signed)
Called to confirm which pharmacy I should send that furosemide. No answer so left voicemail message to call back with that information.

## 2022-10-22 NOTE — Telephone Encounter (Signed)
Reviewed results and recommendations with patient. She read back instructions for medication and repeat labs. She verbalized understanding of our conversation with no further questions at this time.

## 2022-10-22 NOTE — Telephone Encounter (Signed)
-----   Message from Rise Mu, PA-C sent at 10/21/2022  3:17 PM EDT ----- Echo showed normal pump function, normal wall motion, slightly stiffened heart, moderately elevated pressure along the right side of the heart, and a mildly to moderately leaky mitral valve.  When compared to prior echo, pulm function remains normal, her heart is slightly more stiff, and the pressure within the right side of the heart is now elevated.  These findings may be contributing to her shortness of breath.  If she is agreeable, start furosemide 20 mg daily with a follow-up BMP 1 week thereafter to ensure stable renal function and potassium.

## 2022-10-23 ENCOUNTER — Ambulatory Visit (INDEPENDENT_AMBULATORY_CARE_PROVIDER_SITE_OTHER): Payer: Medicare PPO

## 2022-10-23 VITALS — Wt 150.0 lb

## 2022-10-23 DIAGNOSIS — Z23 Encounter for immunization: Secondary | ICD-10-CM

## 2022-10-23 MED ORDER — FUROSEMIDE 20 MG PO TABS: 20.0000 mg | ORAL_TABLET | Freq: Every day | ORAL | 3 refills | Status: DC

## 2022-10-23 NOTE — Telephone Encounter (Signed)
Spoke with patient and confirmed pharmacy and sent that to them. She verbalized understanding with no further questions at this time.

## 2022-10-30 ENCOUNTER — Encounter: Payer: Self-pay | Admitting: Physician Assistant

## 2022-10-30 ENCOUNTER — Ambulatory Visit: Payer: Self-pay | Admitting: *Deleted

## 2022-10-30 NOTE — Addendum Note (Signed)
Addended by: [name] on: 10/30/2022 03:18 PM   Modules accepted: Orders

## 2022-10-30 NOTE — Telephone Encounter (Signed)
I called patient. Thanks for the triage. Correct. It was ordered by Webb Silversmith, FNP 07/2021, and patient says she never received it. I refilled it for 90 day in 08/2021. She still never got it apparently. Never took it. Now pharmacy had it ready for her over a year later. I reviewed this with her, we agree to discontinue since she never took it. I deleted from her med rec and she will cancel at the [facility] 10/30/2022, 3:18 PM

## 2022-10-30 NOTE — Telephone Encounter (Signed)
Reason for Disposition  [1] Caller has URGENT medicine question about med that PCP or specialist prescribed AND [2] triager unable to answer question  Answer Assessment - Initial Assessment Questions 1. NAME of MEDICINE: "What medicine(s) are you calling about?"     I don't understand why I've been prescribed Wellbutrin.    I've been in [place] for a year.   I'm home now. 2. QUESTION: "What is your question?" (e.g., double dose of medicine, side effect)     I got home at the end of August.   Did I see Dr. Parks Ranger since I've been home?    I'm on Prozac but not Wellbutrin that I know of. 3. PRESCRIBER: "Who prescribed the medicine?" Reason: if prescribed by specialist, call should be referred to that group.     Dr. Parks Ranger.  4. SYMPTOMS: "Do you have any symptoms?" If Yes, ask: "What symptoms are you having?"  "How bad are the symptoms (e.g., mild, moderate, severe)     N/A 5. PREGNANCY:  "Is there any chance that you are pregnant?" "When was your last menstrual period?"     N/A  Protocols used: Medication Question Goff  Chief Complaint: Why and when was I prescribed Wellbutrin?   I've been on Prozac for years and still take it but the pharmacy gave me a bottle of Wellbutrin with the date 10/29/2022 for #90 tablets with 0 refills along with my Prozac. I looked in chart and it was started by Webb Silversmith on 08/19/2021 for her mood.  It was refilled on 09/09/2021 possibly.    There is no more mention of it or refills noted up to 10/29/2022.   She would appreciate a call.    Symptoms:  Frequency:  Pertinent Negatives: Patient denies  Disposition: [] ED /[] Urgent Care (no appt availability in office) / [] Appointment(In office/virtual)/ []  [place] [facility]/ [] Home Care/ [] Refused Recommended Disposition /[] [place] [facility]/ [x]  Follow-up with PCP Additional Notes: Please call pt and advise regarding the Wellbutrin rx.

## 2022-12-31 ENCOUNTER — Telehealth: Payer: Self-pay | Admitting: Cardiovascular Disease

## 2022-12-31 DIAGNOSIS — I4891 Unspecified atrial fibrillation: Secondary | ICD-10-CM

## 2022-12-31 MED ORDER — METOPROLOL TARTRATE 25 MG PO TABS: ORAL_TABLET | ORAL | 2 refills | Status: DC

## 2022-12-31 NOTE — Telephone Encounter (Signed)
 *  STAT* If patient is at the pharmacy, call can be transferred to refill team.   1. Which medications need to be refilled? (please list name of each medication and dose if known) metoprolol tartrate (LOPRESSOR) 25 MG tablet   2. Which pharmacy/location (including street and city if local pharmacy) is medication to be sent to? [place] [place] - [address]   3. Do they need a 30 day or 90 day supply? 90 days  Pt needs refill today. She said, she is out of medication

## 2023-01-18 ENCOUNTER — Other Ambulatory Visit: Payer: Self-pay

## 2023-01-18 ENCOUNTER — Telehealth: Payer: Self-pay

## 2023-01-18 DIAGNOSIS — I4891 Unspecified atrial fibrillation: Secondary | ICD-10-CM

## 2023-01-18 MED ORDER — APIXABAN 5 MG PO TABS: 5.0000 mg | ORAL_TABLET | Freq: Two times a day (BID) | ORAL | 1 refills | Status: DC

## 2023-01-18 MED ORDER — METOPROLOL TARTRATE 25 MG PO TABS: ORAL_TABLET | ORAL | 0 refills | Status: DC

## 2023-01-18 NOTE — Addendum Note (Signed)
Addended by: Malen Gauze on: 01/18/2023 11:03 AM   Modules accepted: Orders

## 2023-01-18 NOTE — Telephone Encounter (Signed)
Prescription refill request for Eliquis received. Indication: AF Last office visit:  09/14/22  R Dunn PA-C Scr: 0.84 on 09/14/22 Age: 89 Weight: 68.3kg  Based on above findings Eliquis 5mg  twice daily is the appropriate dose.  Refill approved.

## 2023-01-18 NOTE — Telephone Encounter (Signed)
*  STAT* If patient is at the pharmacy, call can be transferred to refill team.   1. Which medications need to be refilled? (please list name of each medication and dose if known) Eliquis  2. Which pharmacy/location (including street and city if local pharmacy) is medication to be sent to? [facility]  3. Do they need a 30 day or 90 day supply? 90  

## 2023-03-10 ENCOUNTER — Other Ambulatory Visit: Payer: Self-pay

## 2023-03-10 DIAGNOSIS — M797 Fibromyalgia: Secondary | ICD-10-CM

## 2023-03-10 DIAGNOSIS — M159 Polyosteoarthritis, unspecified: Secondary | ICD-10-CM

## 2023-03-10 DIAGNOSIS — E782 Mixed hyperlipidemia: Secondary | ICD-10-CM

## 2023-03-10 MED ORDER — ATORVASTATIN CALCIUM 80 MG PO TABS: ORAL_TABLET | ORAL | 3 refills | Status: AC

## 2023-03-10 MED ORDER — HYDRALAZINE HCL 25 MG PO TABS: 25.0000 mg | ORAL_TABLET | Freq: Two times a day (BID) | ORAL | 3 refills | Status: DC

## 2023-03-11 ENCOUNTER — Encounter: Payer: Self-pay | Admitting: Family Medicine

## 2023-03-11 ENCOUNTER — Ambulatory Visit (INDEPENDENT_AMBULATORY_CARE_PROVIDER_SITE_OTHER): Payer: Medicare PPO | Admitting: Family Medicine

## 2023-03-11 VITALS — BP 116/74 | HR 65 | Ht 63.0 in | Wt 140.0 lb

## 2023-03-11 DIAGNOSIS — M797 Fibromyalgia: Secondary | ICD-10-CM

## 2023-03-11 DIAGNOSIS — E032 Hypothyroidism due to medicaments and other exogenous substances: Secondary | ICD-10-CM

## 2023-03-11 DIAGNOSIS — R634 Abnormal weight loss: Secondary | ICD-10-CM

## 2023-03-11 DIAGNOSIS — E538 Deficiency of other specified B group vitamins: Secondary | ICD-10-CM

## 2023-03-11 DIAGNOSIS — I480 Paroxysmal atrial fibrillation: Secondary | ICD-10-CM | POA: Diagnosis not present

## 2023-03-11 DIAGNOSIS — M159 Polyosteoarthritis, unspecified: Secondary | ICD-10-CM

## 2023-03-11 DIAGNOSIS — R7303 Prediabetes: Secondary | ICD-10-CM

## 2023-03-11 DIAGNOSIS — E782 Mixed hyperlipidemia: Secondary | ICD-10-CM

## 2023-03-11 MED ORDER — GABAPENTIN 300 MG PO CAPS: ORAL_CAPSULE | ORAL | 0 refills | Status: DC

## 2023-03-11 MED ORDER — GABAPENTIN 300 MG PO CAPS: ORAL_CAPSULE | ORAL | 3 refills | Status: DC

## 2023-03-11 NOTE — Progress Notes (Signed)
Subjective:    Patient ID: Stacey Duncan, female    DOB: 1931-08-03, 89 y.o.   MRN: [id number]  Stacey Duncan is a 89 y.o. female presenting on 03/11/2023 for Weight Loss   HPI  Unintentional Weight Loss Reports 10 lbs in 6 months lost. Reduced appetite by her report, less sweets She was in [facility] for 1 year, went to doctor there  Fibromyalgia / Joint Pain Arthritis She takes Gabapentin 1 AM and 2 PM, needs new dose Does not make her groggy  Dyspnea / PAF Followed by Cardiology Saw them previously for dyspnea > ECHO > ordered Furosemide '20mg'$  daily and it has helped.  Elevated A1c, Pre DM 10 lb wt loss since October Asking about blood sugar, she has fam members with diabetes She is due for labs  Admits some additional concerns Unsteady on feet "Fuzzy" head  Vision declining, saw Dr Thomasene Ripple but she has blurry vision at times. No glasses needed is what they said    Upcoming Apt Dr Rockey Situ 03/16/23      07/23/2021    1:33 PM 06/24/2021    1:31 PM 03/18/2021    2:49 PM  Depression screen PHQ 2/9  Decreased Interest 1 1 0  Down, Depressed, Hopeless '1 1 1  '$ PHQ - 2 Score '2 2 1  '$ Altered sleeping 1 1 0  Tired, decreased energy '2 2 3  '$ Change in appetite 2 2 0  Feeling bad or failure about yourself  0 0 0  Trouble concentrating 0 0 0  Moving slowly or fidgety/restless 0 0 0  Suicidal thoughts 0 0 0  PHQ-9 Score '7 7 4  '$ Difficult doing work/chores Somewhat difficult Somewhat difficult Somewhat difficult    Social History   Tobacco Use   Smoking status: Never   Smokeless tobacco: Never  Vaping Use   Vaping Use: Never used  Substance Use Topics   Alcohol use: Yes    Alcohol/week: 7.0 standard drinks of alcohol    Types: 7 Glasses of wine per week    Comment: glass of wine daily   Drug use: No    Review of Systems Per HPI unless specifically indicated above     Objective:    BP 116/74   Pulse 65   Ht '5\' 3"'$  (1.6 m)   Wt 140 lb (63.5 kg)   SpO2 99%    BMI 24.80 kg/m   Wt Readings from Last 3 Encounters:  03/11/23 140 lb (63.5 kg)  10/23/22 150 lb (68 kg)  09/14/22 150 lb 8 oz (68.3 kg)    Physical Exam Vitals and nursing note reviewed.  Constitutional:      General: She is not in acute distress.    Appearance: She is well-developed. She is not diaphoretic.     Comments: Well-appearing, comfortable, cooperative  HENT:     Head: Normocephalic and atraumatic.  Eyes:     General:        Right eye: No discharge.        Left eye: No discharge.     Conjunctiva/sclera: Conjunctivae normal.  Neck:     Thyroid: No thyromegaly.  Cardiovascular:     Rate and Rhythm: Normal rate and regular rhythm.     Heart sounds: Normal heart sounds. No murmur heard.    Comments: No ectopy Pulmonary:     Effort: Pulmonary effort is normal. No respiratory distress.     Breath sounds: Normal breath sounds. No wheezing or rales.  Musculoskeletal:  General: Normal range of motion.     Cervical back: Normal range of motion and neck supple.  Lymphadenopathy:     Cervical: No cervical adenopathy.  Skin:    General: Skin is warm and dry.     Findings: No erythema or rash.  Neurological:     Mental Status: She is alert and oriented to person, place, and time.  Psychiatric:        Behavior: Behavior normal.     Comments: Well groomed, good eye contact, normal speech and thoughts      Results for orders placed or performed in visit on 10/21/22  ECHOCARDIOGRAM COMPLETE  Result Value Ref Range   AR max vel 2.56 cm2   AV Peak grad 4.8 mmHg   Ao pk vel 1.09 m/s   S' Lateral 2.10 cm   Area-P 1/2 5.60 cm2   AV Area VTI 2.68 cm2   AV Mean grad 2.0 mmHg   AV Area mean vel 3.33 cm2      Assessment & Plan:   Problem List Items Addressed This Visit     Fibromyalgia   Relevant Medications   gabapentin (NEURONTIN) 300 MG capsule   Hypothyroidism - Primary   Osteoarthritis   Relevant Medications   gabapentin (NEURONTIN) 300 MG capsule     Today we discussed her concern Her vitals are completely normal and her BMI is 24 10 lbs lost in 6 months is not concerning it is likely with some reduced appetite or reduced sweets  May be related to diuretic Furosemide as well, regular dosing can definitely contribute to 10 lb wt loss over months.  She may have some symptoms from some hyperglycemia still however We need labs to determine this and evaluate her concerns Now she is returning to care after 1 yr in [employer] or Boost supplement for protein intake to help maintain weight if you need  I have refilled Gabapentin to [facility] and to local [facility] for 30 day.  Keep on Fluid pill Furosemide daily since it is resolving your dyspnea. No edema today on exam however, may need to reduce this back if needed in future - but she seems to be doing better with regards to dyspnea now.  Orders Placed This Encounter  Procedures   COMPLETE METABOLIC PANEL WITH GFR    Standing Status:   Future    Standing Expiration Date:   06/28/2023   CBC with Differential/Platelet    Standing Status:   Future    Standing Expiration Date:   06/28/2023   Lipid panel    Standing Status:   Future    Standing Expiration Date:   06/28/2023    Order Specific Question:   Has the patient fasted?    Answer:   Yes   Hemoglobin A1c    Standing Status:   Future    Standing Expiration Date:   06/28/2023   TSH    Standing Status:   Future    Standing Expiration Date:   06/28/2023   T4, free    Standing Status:   Future    Standing Expiration Date:   06/28/2023   Vitamin B12    Standing Status:   Future    Standing Expiration Date:   06/28/2023     Meds ordered this encounter  Medications   gabapentin (NEURONTIN) 300 MG capsule    Sig: TAKE 1 CAPSULE BY MOUTH IN THE MORNING AND 1 AT NOON AND 2 IN THE EVENING  Dispense:  90 capsule    Refill:  0      Follow up plan: Return for return on Monday 3/18 for labs.  Future labs ordered for  03/15/23   Nobie Putnam, [facility] 03/11/2023, 12:10 PM

## 2023-03-11 NOTE — Patient Instructions (Addendum)
Thank you for coming to the office today.  Recommend Ensure or Boost supplement for protein intake to help maintain weight if you need  I believe the 10 lb weight loss over 6 months is not very concerning, it can be due to sugar and other issues.  If you have changed diet some and reduced sweets this can be related to weight loss  I have refilled Gabapentin to [facility] and to local [facility] for 30 day.  Keep on Fluid pill Furosemide daily   Please schedule a Follow-up Appointment to: Return for return on Monday 3/18 for labs.  If you have any other questions or concerns, please feel free to call the office or send a message through [facility]. You may also schedule an earlier appointment if necessary.  Additionally, you may be receiving a survey about your experience at our office within a few days to 1 week by e-mail or mail. We value your feedback.  Nobie Putnam, DO [place]

## 2023-03-12 ENCOUNTER — Other Ambulatory Visit: Payer: Self-pay

## 2023-03-12 DIAGNOSIS — M150 Primary generalized (osteo)arthritis: Secondary | ICD-10-CM

## 2023-03-12 DIAGNOSIS — E538 Deficiency of other specified B group vitamins: Secondary | ICD-10-CM

## 2023-03-12 DIAGNOSIS — R7303 Prediabetes: Secondary | ICD-10-CM

## 2023-03-12 DIAGNOSIS — R634 Abnormal weight loss: Secondary | ICD-10-CM

## 2023-03-12 DIAGNOSIS — E782 Mixed hyperlipidemia: Secondary | ICD-10-CM

## 2023-03-12 DIAGNOSIS — M159 Polyosteoarthritis, unspecified: Secondary | ICD-10-CM

## 2023-03-12 DIAGNOSIS — I480 Paroxysmal atrial fibrillation: Secondary | ICD-10-CM

## 2023-03-12 DIAGNOSIS — M797 Fibromyalgia: Secondary | ICD-10-CM

## 2023-03-12 DIAGNOSIS — E032 Hypothyroidism due to medicaments and other exogenous substances: Secondary | ICD-10-CM

## 2023-03-15 ENCOUNTER — Other Ambulatory Visit: Payer: Medicare PPO

## 2023-03-15 DIAGNOSIS — M797 Fibromyalgia: Secondary | ICD-10-CM | POA: Diagnosis not present

## 2023-03-15 DIAGNOSIS — E538 Deficiency of other specified B group vitamins: Secondary | ICD-10-CM | POA: Diagnosis not present

## 2023-03-15 DIAGNOSIS — R634 Abnormal weight loss: Secondary | ICD-10-CM | POA: Diagnosis not present

## 2023-03-15 DIAGNOSIS — I480 Paroxysmal atrial fibrillation: Secondary | ICD-10-CM | POA: Diagnosis not present

## 2023-03-15 DIAGNOSIS — E032 Hypothyroidism due to medicaments and other exogenous substances: Secondary | ICD-10-CM | POA: Diagnosis not present

## 2023-03-15 DIAGNOSIS — M159 Polyosteoarthritis, unspecified: Secondary | ICD-10-CM | POA: Diagnosis not present

## 2023-03-15 DIAGNOSIS — R7303 Prediabetes: Secondary | ICD-10-CM | POA: Diagnosis not present

## 2023-03-15 DIAGNOSIS — E782 Mixed hyperlipidemia: Secondary | ICD-10-CM | POA: Diagnosis not present
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
## 2023-03-16 ENCOUNTER — Ambulatory Visit: Payer: Medicare PPO | Attending: Cardiovascular Disease | Admitting: Cardiovascular Disease

## 2023-03-16 ENCOUNTER — Encounter: Payer: Self-pay | Admitting: Cardiovascular Disease

## 2023-03-16 VITALS — BP 108/50 | HR 56 | Ht 62.0 in | Wt 142.0 lb

## 2023-03-16 DIAGNOSIS — R7303 Prediabetes: Secondary | ICD-10-CM

## 2023-03-16 DIAGNOSIS — I480 Paroxysmal atrial fibrillation: Secondary | ICD-10-CM | POA: Diagnosis not present

## 2023-03-16 DIAGNOSIS — I10 Essential (primary) hypertension: Secondary | ICD-10-CM | POA: Diagnosis not present

## 2023-03-16 DIAGNOSIS — R0602 Shortness of breath: Secondary | ICD-10-CM

## 2023-03-16 DIAGNOSIS — E782 Mixed hyperlipidemia: Secondary | ICD-10-CM

## 2023-03-16 LAB — CBC WITH DIFFERENTIAL/PLATELET
Absolute Monocytes: 540 cells/uL (ref 200–950)
Basophils Absolute: 66 cells/uL (ref 0–200)
Basophils Relative: 0.8 %
Eosinophils Absolute: 274 cells/uL (ref 15–500)
Eosinophils Relative: 3.3 %
HCT: 39.1 % (ref 35.0–45.0)
Hemoglobin: 12.9 g/dL (ref 11.7–15.5)
Lymphs Abs: 3212 cells/uL (ref 850–3900)
MCH: 30.4 pg (ref 27.0–33.0)
MCHC: 33 g/dL (ref 32.0–36.0)
MCV: 92 fL (ref 80.0–100.0)
MPV: 10 fL (ref 7.5–12.5)
Monocytes Relative: 6.5 %
Neutro Abs: 4208 cells/uL (ref 1500–7800)
Neutrophils Relative %: 50.7 %
Platelets: 300 10*3/uL (ref 140–400)
RBC: 4.25 10*6/uL (ref 3.80–5.10)
RDW: 13.1 % (ref 11.0–15.0)
Total Lymphocyte: 38.7 %
WBC: 8.3 10*3/uL (ref 3.8–10.8)

## 2023-03-16 LAB — LIPID PANEL
Cholesterol: 139 mg/dL (ref <200)
HDL: 48 mg/dL — ABNORMAL LOW (ref >=50)
LDL Cholesterol (Calc): 69 mg/dL (calc)
Non-HDL Cholesterol (Calc): 91 mg/dL (calc) (ref <130)
Total CHOL/HDL Ratio: 2.9 (calc) (ref <5.0)
Triglycerides: 139 mg/dL (ref <150)

## 2023-03-16 LAB — COMPLETE METABOLIC PANEL WITH GFR
AG Ratio: 1.6 (calc) (ref 1.0–2.5)
ALT: 13 U/L (ref 6–29)
AST: 16 U/L (ref 10–35)
Albumin: 3.9 g/dL (ref 3.6–5.1)
Alkaline phosphatase (APISO): 75 U/L (ref 37–153)
BUN: 9 mg/dL (ref 7–25)
CO2: 29 mmol/L (ref 20–32)
Calcium: 9.1 mg/dL (ref 8.6–10.4)
Chloride: 104 mmol/L (ref 98–110)
Creat: 0.8 mg/dL (ref 0.60–0.95)
Globulin: 2.5 g/dL (calc) (ref 1.9–3.7)
Glucose, Bld: 93 mg/dL (ref 65–99)
Potassium: 4.4 mmol/L (ref 3.5–5.3)
Sodium: 140 mmol/L (ref 135–146)
Total Bilirubin: 0.6 mg/dL (ref 0.2–1.2)
Total Protein: 6.4 g/dL (ref 6.1–8.1)
eGFR: 70 mL/min/{1.73_m2} (ref >=60)

## 2023-03-16 LAB — HEMOGLOBIN A1C
Hgb A1c MFr Bld: 5.9 % of total Hgb — ABNORMAL HIGH (ref <5.7)
Mean Plasma Glucose: 123 mg/dL
eAG (mmol/L): 6.8 mmol/L

## 2023-03-16 LAB — TSH: TSH: 0.32 mIU/L — ABNORMAL LOW (ref 0.40–4.50)

## 2023-03-16 LAB — VITAMIN B12: Vitamin B-12: 294 pg/mL (ref 200–1100)

## 2023-03-16 LAB — T4, FREE: Free T4: 1.3 ng/dL (ref 0.8–1.8)

## 2023-03-16 NOTE — Patient Instructions (Addendum)
Medication Instructions:  Please hold the hydralazine  Monitor blood pressure at home If low, call the office  If you need a refill on your cardiac medications before your next appointment, please call your pharmacy.   Lab work: No new labs needed  Testing/Procedures: No new testing needed  Follow-Up: At [facility], you and your health needs are our priority.  As part of our continuing mission to provide you with exceptional heart care, we have created designated Provider Care Teams.  These Care Teams include your primary Cardiologist (physician) and Advanced Practice Providers (APPs -  Physician Assistants and Nurse Practitioners) who all work together to provide you with the care you need, when you need it.  You will need a follow up appointment in 12 months  Providers on your designated Care Team:   Murray Hodgkins, NP Christell Faith, PA-C Cadence Kathlen Mody, [place]  COVID-19 Vaccine Information can be found at: [email or web address] For questions related to vaccine distribution or appointments, please email [email or web address] or call [phone].

## 2023-03-17 ENCOUNTER — Ambulatory Visit (INDEPENDENT_AMBULATORY_CARE_PROVIDER_SITE_OTHER): Payer: Medicare PPO | Admitting: Family Medicine

## 2023-03-17 ENCOUNTER — Encounter: Payer: Self-pay | Admitting: Family Medicine

## 2023-03-17 VITALS — BP 108/62 | HR 60 | Ht 62.0 in | Wt 141.8 lb

## 2023-03-17 DIAGNOSIS — I10 Essential (primary) hypertension: Secondary | ICD-10-CM

## 2023-03-17 DIAGNOSIS — E782 Mixed hyperlipidemia: Secondary | ICD-10-CM | POA: Diagnosis not present

## 2023-03-17 DIAGNOSIS — F339 Major depressive disorder, recurrent, unspecified: Secondary | ICD-10-CM

## 2023-03-17 DIAGNOSIS — E032 Hypothyroidism due to medicaments and other exogenous substances: Secondary | ICD-10-CM | POA: Diagnosis not present

## 2023-03-17 DIAGNOSIS — R7303 Prediabetes: Secondary | ICD-10-CM | POA: Diagnosis not present

## 2023-03-17 DIAGNOSIS — F5104 Psychophysiologic insomnia: Secondary | ICD-10-CM

## 2023-03-17 DIAGNOSIS — I480 Paroxysmal atrial fibrillation: Secondary | ICD-10-CM | POA: Diagnosis not present

## 2023-03-17 MED ORDER — TRAZODONE HCL 50 MG PO TABS: 25.0000 mg | ORAL_TABLET | Freq: Every day | ORAL | 1 refills | Status: DC

## 2023-03-17 NOTE — Progress Notes (Signed)
Subjective:    Patient ID: Stacey Duncan, female    DOB: 08/28/1931, 89 y.o.   MRN: [id number]  Stacey Duncan is a 89 y.o. female presenting on 03/17/2023 for lab results , Hypothyroidism, and Prediabetes   HPI  Updates today with lab review 03/11/23 - last visit with me Last visit with Cardiology Dr Rockey Situ 03/16/23  Lab review CMET normal CBC normal  Unintentional Weight Loss Reports 10 lbs in 6 months lost. Reduced appetite by her report, less sweets She was in [facility] for 1 year, went to doctor there This was discussed previously, no new questions and she will continue to monitor her weight.   Fibromyalgia / Joint Pain Arthritis She takes Gabapentin 1 AM and 2 PM, needs new dose Does not make her groggy   Dyspnea / PAF Followed by Cardiology Saw them previously for dyspnea > ECHO > ordered Furosemide 20mg  daily and it has helped.   Pre-Diabetes A1c 5.9, similar to previous results 5.7 to 5.9 over past few years. 10 lb wt loss since October She has limited sweets in diet with goal to avoid excess carb starches sugars  Hypothyroidism Current results TSH 0.32, Free T4 1.3 result Continues on Levothyroxine 112 mcg daily No concerns from this  HYPERTENSION Cardiology discontinued her Hydralazine recently thought may be causing low BP and symptoms  Vitamin B12 deficiency Result B12 294, goal is >200 but this is low normal. She is not quite ready to start injection therapy, had to do B12 shots before but declines today  Major Depression, recurrent Insomnia secondary She expresses significant concern regarding her adult son who is Bipolar Type 1 and has had complicated life with recent history of behavioral health hospitalization 09/2021. - she went to lawyer, figure out how [name] can manage a job, they cannot afford to have him live there, and they are asking about arrangements for after their passing in the future.  She is struggling and also husband is struggling  to handle this situation - She has difficulty maintaining her home without help, but she does benefit from her husband and son [name] with help caring in the home and daily chores transportation Difficulty transportation, they are dependent on him in their home, they would have to hire help otherwise - Lawyer looking into a place for him in the future - He probably had ADHD, diagnosed with auditory perception problem  All of these things cause her worry and depressed mood at times, some days worse than other, can worsen insomnia  Insomnia Stays up at night and will wake up overnight and then read She may sip on glass of wine overnight to help sleep  Fluoxetine 40mg  daily, in past failed dose 60mg  and also failed coming off of Fluoxetine too quickly. Also failed Wellbutrin side effects      03/17/2023    3:18 PM 07/23/2021    1:33 PM 06/24/2021    1:31 PM  Depression screen PHQ 2/9  Decreased Interest 0 1 1  Down, Depressed, Hopeless 2 1 1   PHQ - 2 Score 2 2 2   Altered sleeping 2 1 1   Tired, decreased energy 2 2 2   Change in appetite 1 2 2   Feeling bad or failure about yourself  0 0 0  Trouble concentrating 0 0 0  Moving slowly or fidgety/restless 0 0 0  Suicidal thoughts 0 0 0  PHQ-9 Score 7 7 7   Difficult doing work/chores Not difficult at all Somewhat difficult Somewhat difficult  Social History   Tobacco Use   Smoking status: Never   Smokeless tobacco: Never  Vaping Use   Vaping Use: Never used  Substance Use Topics   Alcohol use: Yes    Alcohol/week: 7.0 standard drinks of alcohol    Types: 7 Glasses of wine per week    Comment: glass of wine daily   Drug use: No    Review of Systems Per HPI unless specifically indicated above     Objective:    BP 108/62   Pulse 60   Ht 5\' 2"  (1.575 m)   Wt 141 lb 12.8 oz (64.3 kg)   SpO2 97%   BMI 25.94 kg/m   Wt Readings from Last 3 Encounters:  03/17/23 141 lb 12.8 oz (64.3 kg)  03/16/23 142 lb (64.4 kg)   03/11/23 140 lb (63.5 kg)    Physical Exam Vitals and nursing note reviewed.  Constitutional:      General: She is not in acute distress.    Appearance: She is well-developed. She is not diaphoretic.     Comments: Well-appearing, comfortable, cooperative  HENT:     Head: Normocephalic and atraumatic.  Eyes:     General:        Right eye: No discharge.        Left eye: No discharge.     Conjunctiva/sclera: Conjunctivae normal.  Neck:     Thyroid: No thyromegaly.  Cardiovascular:     Rate and Rhythm: Normal rate and regular rhythm.     Heart sounds: Normal heart sounds. No murmur heard. Pulmonary:     Effort: Pulmonary effort is normal. No respiratory distress.     Breath sounds: Normal breath sounds. No wheezing or rales.  Musculoskeletal:        General: Normal range of motion.     Cervical back: Normal range of motion and neck supple.  Lymphadenopathy:     Cervical: No cervical adenopathy.  Skin:    General: Skin is warm and dry.     Findings: No erythema or rash.  Neurological:     Mental Status: She is alert and oriented to person, place, and time.  Psychiatric:        Behavior: Behavior normal.     Comments: Well groomed, good eye contact, normal speech and thoughts      Results for orders placed or performed in visit on 03/12/23  Vitamin B12  Result Value Ref Range   Vitamin B-12 294 200 - 1,100 pg/mL  T4, free  Result Value Ref Range   Free T4 1.3 0.8 - 1.8 ng/dL  TSH  Result Value Ref Range   TSH 0.32 (L) 0.40 - 4.50 mIU/L  Hemoglobin A1c  Result Value Ref Range   Hgb A1c MFr Bld 5.9 (H) <5.7 % of total Hgb   Mean Plasma Glucose 123 mg/dL   eAG (mmol/L) 6.8 mmol/L  Lipid panel  Result Value Ref Range   Cholesterol 139 <200 mg/dL   HDL 48 (L) > OR = 50 mg/dL   Triglycerides 139 <150 mg/dL   LDL Cholesterol (Calc) 69 mg/dL (calc)   Total CHOL/HDL Ratio 2.9 <5.0 (calc)   Non-HDL Cholesterol (Calc) 91 <130 mg/dL (calc)  CBC with  Differential/Platelet  Result Value Ref Range   WBC 8.3 3.8 - 10.8 Thousand/uL   RBC 4.25 3.80 - 5.10 Million/uL   Hemoglobin 12.9 11.7 - 15.5 g/dL   HCT 39.1 35.0 - 45.0 %   MCV 92.0 80.0 - 100.0  fL   MCH 30.4 27.0 - 33.0 pg   MCHC 33.0 32.0 - 36.0 g/dL   RDW 13.1 11.0 - 15.0 %   Platelets 300 140 - 400 Thousand/uL   MPV 10.0 7.5 - 12.5 fL   Neutro Abs 4,208 1,500 - 7,800 cells/uL   Lymphs Abs 3,212 850 - 3,900 cells/uL   Absolute Monocytes 540 200 - 950 cells/uL   Eosinophils Absolute 274 15 - 500 cells/uL   Basophils Absolute 66 0 - 200 cells/uL   Neutrophils Relative % 50.7 %   Total Lymphocyte 38.7 %   Monocytes Relative 6.5 %   Eosinophils Relative 3.3 %   Basophils Relative 0.8 %  COMPLETE METABOLIC PANEL WITH GFR  Result Value Ref Range   Glucose, Bld 93 65 - 99 mg/dL   BUN 9 7 - 25 mg/dL   Creat 0.80 0.60 - 0.95 mg/dL   eGFR 70 > OR = 60 mL/min/1.60m2   BUN/Creatinine Ratio SEE NOTE: 6 - 22 (calc)   Sodium 140 135 - 146 mmol/L   Potassium 4.4 3.5 - 5.3 mmol/L   Chloride 104 98 - 110 mmol/L   CO2 29 20 - 32 mmol/L   Calcium 9.1 8.6 - 10.4 mg/dL   Total Protein 6.4 6.1 - 8.1 g/dL   Albumin 3.9 3.6 - 5.1 g/dL   Globulin 2.5 1.9 - 3.7 g/dL (calc)   AG Ratio 1.6 1.0 - 2.5 (calc)   Total Bilirubin 0.6 0.2 - 1.2 mg/dL   Alkaline phosphatase (APISO) 75 37 - 153 U/L   AST 16 10 - 35 U/L   ALT 13 6 - 29 U/L      Assessment & Plan:   Problem List Items Addressed This Visit     Essential hypertension   Relevant Orders   AMB Referral to Chronic Care Management Services   Hyperlipidemia - Primary   Hypothyroidism   Relevant Orders   AMB Referral to Chronic Care Management Services   Major depression, recurrent, chronic (HCC)   Relevant Medications   traZODone (DESYREL) 50 MG tablet   Other Relevant Orders   AMB Referral to Chronic Care Management Services   Paroxysmal atrial fibrillation (HCC)   Relevant Orders   AMB Referral to Chronic Care Management  Services   Pre-diabetes   Other Visit Diagnoses     Psychophysiologic insomnia       Relevant Medications   traZODone (DESYREL) 50 MG tablet       Major Depression recurrent chronic Multiple life stressors, mostly related to navigating future for her son with bipolar disorder Keep Fluoxetine prozac 40mg  daily, no change today Start Trazodone 50mg  nightly for sleep and mood - dosing can do half or whole, regular dosing, future can do AS NEEDED Advised I would avoid swap of her long term SSRI today, because she is experiencing flare of mood, I would advise get this stable with adding Trazodone first, and improving her sleep, then we can consider transition from Fluoxetine to other SSRI  ----------------  Pre Diabetes A1c 5.9 Stable from previous, encourage lifestyle diet.  Hypothyroidism Controlled on labs, continue Levothyroxine 112 mcg daily  HYPERTENSION Improved low readings recently Agree with Cardiology decision to Discontinue Hydralazine.  Referral to the Chronic Care Management Team - Nurse [place] and Social Worker [place] and they can assist with helping you navigate future plans w/ [name].  Vitamin B12 insufficiency Vitamin B12 - 294 - still normal but it is low range of normal. We can offer B12  injection series here if interested. Can do weekly x 4 weeks then monthly, or can do monthly only for a few months and recheck - She declines this today will reconsider  Meds ordered this encounter  Medications   traZODone (DESYREL) 50 MG tablet    Sig: Take 0.5-1 tablets (25-50 mg total) by mouth at bedtime.    Dispense:  90 tablet    Refill:  1      Follow up plan: Return in about 3 months (around 06/17/2023) for 3 month mood insomnia updates.   Nobie Putnam, [facility] 03/17/2023, 2:52 PM

## 2023-03-17 NOTE — Patient Instructions (Addendum)
Thank you for coming to the office today.  Start Trazodone 50mg  nightly for sleep and mood  If needed okay to take HALF pill instead If it seems too strong.  In the future can switch to only as needed if you are doing better can scale back  Keep Fluoxetine prozac 40mg  daily, no change today  In the future we can revisit this once ready we may switch medications  ----------------  Referral to the Chronic Care Management Team - Nurse [place] and Social Worker Chrystal and they can assist with helping you navigate future plans w/ [name].   Agree with Cardiology to HOLD Hydralazine due to lower BP  --------------  1. Chemistry - Normal results, including electrolytes, kidney and liver function. Normal fasting blood sugar   2. Hemoglobin A1c (Diabetes screening) - 5.9, stable from previous 5.7 to 5.9 range, still mild elevated in range of Pre-Diabetes (>5.7 to 6.4)   3. TSH Thyroid Function Tests - Slightly low TSH but Normal Free T4. Keep on current dosage Levothyroxine 143mcg daily   4. Cholesterol - Well controlled on Atorvastatin.   5. CBC Blood Counts - Normal, no anemia, other abnormality   6. Vitamin B12 - 294 - still normal but it is low range of normal. We can offer B12 injection series here if interested. Can do weekly x 4 weeks then monthly, or can do monthly only for a few months and recheck. Let me know if she is interested. It is not required - she could take OTC Vitamin B12 if she prefers instead. Depends on if she feels she is very tired or fatigued or low energy. - Please let me know   Please schedule a Follow-up Appointment to: Return in about 3 months (around 06/17/2023) for 3 month mood insomnia updates.  If you have any other questions or concerns, please feel free to call the office or send a message through [place]. You may also schedule an earlier appointment if necessary.  Additionally, you may be receiving a survey about your experience at our office within a few  days to 1 week by e-mail or mail. We value your feedback.  Nobie Putnam, DO [place]

## 2023-03-22 ENCOUNTER — Telehealth: Payer: Self-pay

## 2023-03-22 NOTE — Progress Notes (Signed)
  Chronic Care Management   Note  03/22/2023 Name: Stacey Duncan MRN: [id number] DOB: [birth year]  Stacey Duncan is a 89 y.o. year old female who is a primary care patient of [name], DO. I reached out to [name] by phone today in response to a referral sent by Stacey Duncan's PCP.  Stacey Duncan was given information about Chronic Care Management services today including:  CCM service includes personalized support from designated clinical staff supervised by the physician, including individualized plan of care and coordination with other care providers 24/7 contact phone numbers for assistance for urgent and routine care needs. Service will only be billed when office clinical staff spend 20 minutes or more in a month to coordinate care. Only one practitioner may furnish and bill the service in a calendar month. The patient may stop CCM services at amy time (effective at the end of the month) by phone call to the office staff. The patient will be responsible for cost sharing (co-pay) or up to 20% of the service fee (after annual deductible is met)  Stacey Duncan [facility]  agreedto scheduling an appointment with the CCM RN Case Manager   Follow up plan: Patient agreed to scheduled appointment with RN Case Manager on 03/23/2023 LCSW 03/29/2023(date/time).   Stacey Duncan, [place], [place] [address] Direct Dial: [phone] [email or web address]

## 2023-03-23 ENCOUNTER — Other Ambulatory Visit: Payer: Self-pay | Admitting: Family Medicine

## 2023-03-23 ENCOUNTER — Telehealth: Payer: Medicare PPO

## 2023-03-23 ENCOUNTER — Ambulatory Visit (INDEPENDENT_AMBULATORY_CARE_PROVIDER_SITE_OTHER): Payer: Medicare PPO

## 2023-03-23 DIAGNOSIS — F411 Generalized anxiety disorder: Secondary | ICD-10-CM

## 2023-03-23 DIAGNOSIS — I480 Paroxysmal atrial fibrillation: Secondary | ICD-10-CM

## 2023-03-23 DIAGNOSIS — I10 Essential (primary) hypertension: Secondary | ICD-10-CM

## 2023-03-23 DIAGNOSIS — E538 Deficiency of other specified B group vitamins: Secondary | ICD-10-CM

## 2023-03-23 DIAGNOSIS — F339 Major depressive disorder, recurrent, unspecified: Secondary | ICD-10-CM

## 2023-03-23 DIAGNOSIS — F439 Reaction to severe stress, unspecified: Secondary | ICD-10-CM

## 2023-03-23 MED ORDER — CYANOCOBALAMIN 1000 MCG/ML IJ SOLN: 1000.0000 ug | INTRAMUSCULAR | 0 refills | Status: DC

## 2023-03-23 NOTE — Plan of Care (Signed)
Chronic Care Management Provider Comprehensive Care Plan    03/23/2023 Name: Stacey Duncan MRN: [id number] DOB: [birth year]  Referral to Chronic Care Management (CCM) services was placed by Provider:  Dr. Nobie Putnam on Date: 03-17-2023.  Chronic Condition 1: AFIB Provider Assessment and Plan AMB Referral to Chronic Care Management Services    Expected Outcome/Goals Addressed This Visit (Provider CCM goals/Provider Assessment and plan   CCM (AFIB)  EXPECTED OUTCOME:  MONITOR, SELF- MANAGE AND REDUCE SYMPTOMS OF AFIB   Symptom Management Condition 1: Take all medications as prescribed Attend all scheduled provider appointments Call provider office for new concerns or questions  call the Suicide and Crisis Lifeline: 988 call the [place] National Suicide Prevention Lifeline: 785-482-1807 or TTY: 732-052-6917 TTY [phone]) to talk to a trained counselor call 1-800-273-TALK (toll free, 24 hour hotline) if experiencing a Mental Health or [place]  make a plan to eat healthy keep all lab appointments take medicine as prescribed  Chronic Condition 2: HTN Provider Assessment and Plan AMB Referral to Chronic Care Management Services  Improved low readings recently Agree with Cardiology decision to Discontinue Hydralazine.   Referral to the Chronic Care Management Team - Nurse [place] and Social Worker [place] and they can assist with helping you navigate future plans w/ [name].     Expected Outcome/Goals Addressed This Visit (Provider CCM goals/Provider Assessment and plan    CCM (HYPERTENSION)  EXPECTED OUTCOME:  MONITOR,SELF- MANAGE AND REDUCE SYMPTOMS OF HYPERTENSION  Symptom Management Condition 2: Take all medications as prescribed Attend all scheduled provider appointments Call provider office for new concerns or questions  call the Suicide and Crisis Lifeline: 988 call the [place] National Suicide Prevention Lifeline: 901-031-4874 or TTY:  640-669-1498 TTY 440-806-1470) to talk to a trained counselor call 1-800-273-TALK (toll free, 24 hour hotline) if experiencing a Mental Health or [place]  check blood pressure daily learn about high blood pressure keep a blood pressure log take blood pressure log to all doctor appointments call doctor for signs and symptoms of high blood pressure keep all doctor appointments take medications for blood pressure exactly as prescribed report new symptoms to your doctor  Chronic Condition 3: Depression, anxiety, stress Provider Assessment and Plan  raZODone (DESYREL) 50 MG tablet     Other Relevant Orders    AMB Referral to Chronic Care Management Services  Major Depression recurrent chronic Multiple life stressors, mostly related to navigating future for her son with bipolar disorder Keep Fluoxetine prozac 40mg  daily, no change today Start Trazodone 50mg  nightly for sleep and mood - dosing can do half or whole, regular dosing, future can do AS NEEDED Advised I would avoid swap of her long term SSRI today, because she is experiencing flare of mood, I would advise get this stable with adding Trazodone first, and improving her sleep, then we can consider transition from Fluoxetine to other SSRI     Expected Outcome/Goals Addressed This Visit (Provider CCM goals/Provider Assessment and plan    CCM (depression, anxiety, stress  EXPECTED OUTCOME:  MONITOR,SELF- MANAGE AND REDUCE SYMPTOMS OF depression, anxiety, stress  Symptom Management Condition 3: Take all medications as prescribed Attend all scheduled provider appointments Call provider office for new concerns or questions  call the Suicide and Crisis Lifeline: 988 call the [place] National Suicide Prevention Lifeline: 719-785-9043 or TTY: 2127844751 TTY 864-686-0339) to talk to a trained counselor call 1-800-273-TALK (toll free, 24 hour hotline) if experiencing a Mental Health or [place]  Problem  List Patient Active Problem List   Diagnosis Date Noted   Sensory neuropathy 07/23/2021   Osteoarthritis 05/26/2018   Pre-diabetes 11/23/2017   Osteopenia determined by x-ray 11/09/2017   GAD (generalized anxiety disorder) 10/20/2017   GERD (gastroesophageal reflux disease) 04/28/2017   Essential hypertension 07/28/2016   Paroxysmal atrial fibrillation (HCC)    Hyperlipidemia 01/31/2016   Fibromyalgia 12/16/2015   Chronic fatigue 04/19/2015   Hypothyroidism 04/19/2015   Major depression, recurrent, chronic ([place]) 04/19/2015    Medication Management  Current Outpatient Medications:    cyanocobalamin (VITAMIN B12) 1000 MCG/ML injection, Inject 1 mL (1,000 mcg total) into the muscle once a week. For 4 weeks, Disp: 4 mL, Rfl: 0   apixaban (ELIQUIS) 5 MG TABS tablet, Take 1 tablet (5 mg total) by mouth 2 (two) times daily., Disp: 180 tablet, Rfl: 1   atorvastatin (LIPITOR) 80 MG tablet, TAKE 1 TABLET BY MOUTH ONCE DAILY AT  6PM, Disp: 90 tablet, Rfl: 3   Cholecalciferol 25 MCG (1000 UT) tablet, Take 1,000 Units by mouth daily. , Disp: , Rfl:    clobetasol cream (TEMOVATE) [id number] %, Apply 1 application topically 2 (two) times daily as needed (skin irritation)., Disp: 30 g, Rfl: 0   FLUoxetine (PROZAC) 40 MG capsule, Take 1 capsule by mouth once daily, Disp: 30 capsule, Rfl: 0   furosemide (LASIX) 20 MG tablet, Take 1 tablet (20 mg total) by mouth daily., Disp: 90 tablet, Rfl: 3   gabapentin (NEURONTIN) 300 MG capsule, TAKE 1 CAPSULE BY MOUTH IN THE MORNING AND 2 IN THE EVENING, Disp: 270 capsule, Rfl: 3   gabapentin (NEURONTIN) 300 MG capsule, TAKE 1 CAPSULE BY MOUTH IN THE MORNING AND 1 AT NOON AND 2 IN THE EVENING (Patient not taking: Reported on 03/16/2023), Disp: 90 capsule, Rfl: 0   levothyroxine (SYNTHROID) 112 MCG tablet, Take 1 tablet (112 mcg total) by mouth daily before breakfast., Disp: 90 tablet, Rfl: 3   metoprolol tartrate (LOPRESSOR) 25 MG tablet, TAKE 1/2 TABLET TWICE A DAY.,  Disp: 90 tablet, Rfl: 0   omeprazole (PRILOSEC) 40 MG capsule, Take 1 capsule (40 mg total) by mouth daily as needed., Disp: 90 capsule, Rfl: 3   tiZANidine (ZANAFLEX) 2 MG tablet, Take 1 tablet (2 mg total) by mouth every 6 (six) hours as needed for muscle spasms., Disp: 30 tablet, Rfl: 0   traZODone (DESYREL) 50 MG tablet, Take 0.5-1 tablets (25-50 mg total) by mouth at bedtime., Disp: 90 tablet, Rfl: 1  Cognitive Assessment Identity Confirmed: : Name; DOB Cognitive Status: Normal   Functional Assessment Hearing Difficulty or Deaf: no Wear Glasses or Blind: yes Vision Management: wears reading glasses, had cataract surgery Concentrating, Remembering or Making Decisions Difficulty (CP): no Difficulty Communicating: no Difficulty Eating/Swallowing: no Walking or Climbing Stairs Difficulty: yes Walking or Climbing Stairs: ambulation difficulty, requires equipment Mobility Management: uses a cane when ambulating Dressing/Bathing Difficulty: no Doing Errands Independently Difficulty (such as shopping) (CP): yes Errands Management: their son drives them to their appointments   Caregiver Assessment  Primary Source of Support/Comfort: spouse; child(ren) Name of Support/Comfort Primary Source: Husband- Mallie Mussel, son- [name], and Daughter People in Home: child(ren), adult; spouse Family Caregiver if Needed: child(ren), adult; spouse Family Caregiver Names: husband, son, and daughter Primary Roles/Responsibilities: retired   Planned Interventions  Evaluation of current treatment plan related to hypertension self management and patient's adherence to plan as established by provider. The patient saw her cardiologist last week and one of her medications was put  on hold. She states that her blood pressures had been dropping lower. The patient is keeping track of it at home and today it has been the highest at 157/50. She states she can't explain it but her head feels "heavy". Her B12 level was low  at the pcp last week and after talking to her she is receptive to trying the B12 injections. Were able to get those set up with the office today and she will have her first injection next Wednesday at around 10 am. She has been instructed to go to the pharmacy and pick up the vial of B12 and bring to the office with her. The patient verbalized understanding. ;   Provided education to patient re: stroke prevention, s/s of heart attack and stroke; Reviewed prescribed diet heart healthy diet Reviewed medications with patient and discussed importance of compliance. The patient is complaint with medications. Denies any new concerns related to medications. ;  Discussed plans with patient for ongoing care management follow up and provided patient with direct contact information for care management team; Advised patient, providing education and rationale, to monitor blood pressure daily and record, calling PCP for findings outside established parameters;  Reviewed scheduled/upcoming provider appointments including: 03-31-2023 at 10 am to get Vitamin B12 injections, 06-18-2023 at 240 pm Advised patient to discuss changes in her blood pressures, questions, or concerns  with provider; Provided education on prescribed diet heart healthy diet;  Discussed complications of poorly controlled blood pressure such as heart disease, stroke, circulatory complications, vision complications, kidney impairment, sexual dysfunction;  Screening for signs and symptoms of depression related to chronic disease state;  Assessed social determinant of health barriers;  Evaluation of current treatment plan related to depression, anxiety, and stress and patient's adherence to plan as established by provider Advised patient to call the office for changes in her mood, anxiety, depression, stress level, or other mental health needs. Provided education to patient re: mindfulness, journal writing, and making sure she practices self care and  getting what she needs to manage her mental health and well being. The patient is concerned about her adult son and what may happen to him after her and her husband are gone. She states that he is 59 and has never held down a steady job. He has been married in the past for about 5 years but that did not last. He does not have children. [name] lives with her and her husband. He is a very social person and he takes them to their appointments but he would not be able to care for himself financially. She and her husband are going tomorrow to see about getting him disability. She is hopeful the social worker will be able to assist with resources as well.  Reviewed medications with patient and discussed compliance. The patient is compliant with medications. She is still waiting for her trazodone to come as it is through mail order.  Collaborated with pcp and office CMA regarding Vitamin B12 injections Provided patient with bipolar  educational materials related to the patient wanting information on bipolar to help her understand her sons condition as well as possible Reviewed scheduled/upcoming provider appointments including 03-31-2023 at 10 am for Vitamin B12 injection and 06-18-2023 at 240 pm with pcp Social Work referral for assistance for resources for her adult son who is bipolar 1 and also for effective management of her mental health and well being. Appointment for 04-01-2023 at 130 pm with LCSW- reminder provided today Discussed plans with patient for  ongoing care management follow up and provided patient with direct contact information for care management team Advised patient to discuss changes in her mood, anxiety, stress, depression or mental health with provider Screening for signs and symptoms of depression related to chronic disease state  Assessed social determinant of health barriers Provider order and care plan reviewed. Collaborated with PharmD regarding patient care and plan. Counseled on  increased risk of stroke due to Afib and benefits of anticoagulation for stroke prevention           Reviewed importance of adherence to anticoagulant exactly as prescribed Advised patient to discuss changes in AFIB, questions, or concerns with provider Counseled on bleeding risk associated with AFIB and importance of self-monitoring for signs/symptoms of bleeding Counseled on avoidance of NSAIDs due to increased bleeding risk with anticoagulants Counseled on importance of regular laboratory monitoring as prescribed Counseled on seeking medical attention after a head injury or if there is blood in the urine/stool Afib action plan reviewed Screening for signs and symptoms of depression related to chronic disease state Assessed social determinant of health barriers     Interaction and coordination with outside resources, practitioners, and providers See CCM Referral  Care Plan: Printed and mailed to patient

## 2023-03-23 NOTE — Patient Instructions (Addendum)
Please call the care guide team at [phone] if you need to cancel or reschedule your appointment.   If you are experiencing a Mental Health or [place] or need someone to talk to, please call the Suicide and Crisis Lifeline: 988 call the [place] National Suicide Prevention Lifeline: 819-756-7990 or TTY: 5737340675 TTY 251 721 4360) to talk to a trained counselor call 1-800-273-TALK (toll free, 24 hour hotline)   Following is a copy of the CCM Program Consent:  CCM service includes personalized support from designated clinical staff supervised by the physician, including individualized plan of care and coordination with other care providers 24/7 contact phone numbers for assistance for urgent and routine care needs. Service will only be billed when office clinical staff spend 20 minutes or more in a month to coordinate care. Only one practitioner may furnish and bill the service in a calendar month. The patient may stop CCM services at amy time (effective at the end of the month) by phone call to the office staff. The patient will be responsible for cost sharing (co-pay) or up to 20% of the service fee (after annual deductible is met)  Following is a copy of your full provider care plan:   Goals Addressed             This Visit's Progress    CCM Expected Outcome:  Monitor, Self-Manage and Reduce Symptoms of Afib       Current Barriers:  Chronic Disease Management support and education needs related to effective management of AFIB  Planned Interventions: Provider order and care plan reviewed. Collaborated with PharmD regarding patient care and plan. Counseled on increased risk of stroke due to Afib and benefits of anticoagulation for stroke prevention           Reviewed importance of adherence to anticoagulant exactly as prescribed Advised patient to discuss changes in AFIB, questions, or concerns with provider Counseled on bleeding risk associated with AFIB and  importance of self-monitoring for signs/symptoms of bleeding Counseled on avoidance of NSAIDs due to increased bleeding risk with anticoagulants Counseled on importance of regular laboratory monitoring as prescribed Counseled on seeking medical attention after a head injury or if there is blood in the urine/stool Afib action plan reviewed Screening for signs and symptoms of depression related to chronic disease state Assessed social determinant of health barriers  Symptom Management: Take medications as prescribed   Attend all scheduled provider appointments Call provider office for new concerns or questions  call the Suicide and Crisis Lifeline: 988 call the [place] National Suicide Prevention Lifeline: 507-442-5174 or TTY: 613-330-1279 TTY (479)745-8058) to talk to a trained counselor call 1-800-273-TALK (toll free, 24 hour hotline) if experiencing a Mental Health or [place]  - make a plan to eat healthy - keep all lab appointments - take medicine as prescribed  Follow Up Plan: Telephone follow up appointment with care management team member scheduled for: 05-11-2023 at 145 pm       CCM Expected Outcome:  Monitor, Self-Manage and Reduce Symptoms of: depression, anxiety, stress       Current Barriers:  Knowledge Deficits related to resources to help with questions she has and help for her adult son who lives with her and her husband that is 98 and is bipolar  Care Coordination needs related to social work support and resources to help with managing her depression, anxiety, and stress and get help with needs and concerns related to her adult son in a patient with depression, anxiety,  and stress Chronic Disease Management support and education needs related to effective management of depression, anxiety, and stress  Planned Interventions: Evaluation of current treatment plan related to depression, anxiety, and stress and patient's adherence to plan as established by  provider Advised patient to call the office for changes in her mood, anxiety, depression, stress level, or other mental health needs. Provided education to patient re: mindfulness, journal writing, and making sure she practices self care and getting what she needs to manage her mental health and well being. The patient is concerned about her adult son and what may happen to him after her and her husband are gone. She states that he is 81 and has never held down a steady job. He has been married in the past for about 5 years but that did not last. He does not have children. [name] lives with her and her husband. He is a very social person and he takes them to their appointments but he would not be able to care for himself financially. She and her husband are going tomorrow to see about getting him disability. She is hopeful the social worker will be able to assist with resources as well.  Reviewed medications with patient and discussed compliance. The patient is compliant with medications. She is still waiting for her trazodone to come as it is through mail order.  Collaborated with pcp and office CMA regarding Vitamin B12 injections Provided patient with bipolar  educational materials related to the patient wanting information on bipolar to help her understand her sons condition as well as possible Reviewed scheduled/upcoming provider appointments including 03-31-2023 at 10 am for Vitamin B12 injection and 06-18-2023 at 240 pm with pcp Social Work referral for assistance for resources for her adult son who is bipolar 1 and also for effective management of her mental health and well being. Appointment for 04-01-2023 at 130 pm with LCSW- reminder provided today Discussed plans with patient for ongoing care management follow up and provided patient with direct contact information for care management team Advised patient to discuss changes in her mood, anxiety, stress, depression or mental health with  provider Screening for signs and symptoms of depression related to chronic disease state  Assessed social determinant of health barriers  Symptom Management: Take medications as prescribed   Attend all scheduled provider appointments Call provider office for new concerns or questions  call the Suicide and Crisis Lifeline: 988 call the [place] National Suicide Prevention Lifeline: 279 784 6743 or TTY: (973)045-3447 TTY [phone]) to talk to a trained counselor call 1-800-273-TALK (toll free, 24 hour hotline) if experiencing a Mental Health or [place]   Follow Up Plan: Telephone follow up appointment with care management team member scheduled for:  05-11-2023 at 145 pm       CCM Expected Outcome:  Monitor, Self-Manage, and Reduce Symptoms of Hypertension       Current Barriers:  Care Coordination needs related to changes in her blood pressures and sx and sx that she is having that may be related to blood pressures or other chronic conditions  in a patient with HTN and other heart conditions Chronic Disease Management support and education needs related to effective management of HTN  BP Readings from Last 3 Encounters:  03/17/23 108/62  03/16/23 (!) 108/50  03/11/23 116/74     Planned Interventions: Evaluation of current treatment plan related to hypertension self management and patient's adherence to plan as established by provider. The patient saw her cardiologist last week and  one of her medications was put on hold. She states that her blood pressures had been dropping lower. The patient is keeping track of it at home and today it has been the highest at 157/50. She states she can't explain it but her head feels "heavy". Her B12 level was low at the pcp last week and after talking to her she is receptive to trying the B12 injections. Were able to get those set up with the office today and she will have her first injection next Wednesday at around 10 am. She has been  instructed to go to the pharmacy and pick up the vial of B12 and bring to the office with her. The patient verbalized understanding. ;   Provided education to patient re: stroke prevention, s/s of heart attack and stroke; Reviewed prescribed diet heart healthy diet Reviewed medications with patient and discussed importance of compliance. The patient is complaint with medications. Denies any new concerns related to medications. ;  Discussed plans with patient for ongoing care management follow up and provided patient with direct contact information for care management team; Advised patient, providing education and rationale, to monitor blood pressure daily and record, calling PCP for findings outside established parameters;  Reviewed scheduled/upcoming provider appointments including: 03-31-2023 at 10 am to get Vitamin B12 injections, 06-18-2023 at 240 pm Advised patient to discuss changes in her blood pressures, questions, or concerns  with provider; Provided education on prescribed diet heart healthy diet;  Discussed complications of poorly controlled blood pressure such as heart disease, stroke, circulatory complications, vision complications, kidney impairment, sexual dysfunction;  Screening for signs and symptoms of depression related to chronic disease state;  Assessed social determinant of health barriers;   Symptom Management: Take medications as prescribed   Attend all scheduled provider appointments Call provider office for new concerns or questions  call the Suicide and Crisis Lifeline: 988 call the [place] National Suicide Prevention Lifeline: 772-888-9716 or TTY: 270-781-3933 TTY [phone]) to talk to a trained counselor call 1-800-273-TALK (toll free, 24 hour hotline) if experiencing a Mental Health or [place]  learn about high blood pressure take blood pressure log to all doctor appointments call doctor for signs and symptoms of high blood pressure develop an  action plan for high blood pressure keep all doctor appointments take medications for blood pressure exactly as prescribed report new symptoms to your doctor  Follow Up Plan: Telephone follow up appointment with care management team member scheduled for: 05-11-2023 at 145 pm          The patient verbalized understanding of instructions, educational materials, and care plan provided today and agreed to receive a mailed copy of patient instructions, educational materials, and care plan.   Telephone follow up appointment with care management team member scheduled for: 05-11-2023 at 145 pm  Vitamin B12 Injection What is this medication? Vitamin B12 (VAHY tuh min B12) prevents and treats low vitamin B12 levels in your body. It is used in people who do not get enough vitamin B12 from their diet or when their digestive tract does not absorb enough. Vitamin B12 plays an important role in maintaining the health of your nervous system and red blood cells. This medicine may be used for other purposes; ask your health care provider or pharmacist if you have questions. COMMON BRAND NAME(S): B-12 Compliance Kit, B-12 Injection Kit, Cyomin, Dodex, LA-12, Nutri-Twelve, Physicians EZ Use B-12, Primabalt What should I tell my care team before I take this medication? They need to  know if you have any of these conditions: Kidney disease Leber's disease Megaloblastic anemia An unusual or allergic reaction to cyanocobalamin, cobalt, other medications, foods, dyes, or preservatives Pregnant or trying to get pregnant Breast-feeding How should I use this medication? This medication is injected into a muscle or deeply under the skin. It is usually given in a clinic or care team's office. However, your care team may teach you how to inject yourself. Follow all instructions. Talk to your care team about the use of this medication in children. Special care may be needed. Overdosage: If you think you have taken too  much of this medicine contact a poison control center or emergency room at once. NOTE: This medicine is only for you. Do not share this medicine with others. What if I miss a dose? If you are given your dose at a clinic or care team's office, call to reschedule your appointment. If you give your own injections, and you miss a dose, take it as soon as you can. If it is almost time for your next dose, take only that dose. Do not take double or extra doses. What may interact with this medication? Alcohol Colchicine This list may not describe all possible interactions. Give your health care provider a list of all the medicines, herbs, non-prescription drugs, or dietary supplements you use. Also tell them if you smoke, drink alcohol, or use illegal drugs. Some items may interact with your medicine. What should I watch for while using this medication? Visit your care team regularly. You may need blood work done while you are taking this medication. You may need to follow a special diet. Talk to your care team. Limit your alcohol intake and avoid smoking to get the best benefit. What side effects may I notice from receiving this medication? Side effects that you should report to your care team as soon as possible: Allergic reactions--skin rash, itching, hives, swelling of the face, lips, tongue, or throat Swelling of the ankles, hands, or feet Trouble breathing Side effects that usually do not require medical attention (report to your care team if they continue or are bothersome): Diarrhea This list may not describe all possible side effects. Call your doctor for medical advice about side effects. You may report side effects to FDA at 1-800-FDA-1088. Where should I keep my medication? Keep out of the reach of children. Store at room temperature between 15 and 30 degrees C (59 and 85 degrees F). Protect from light. Throw away any unused medication after the expiration date. NOTE: This sheet is a  summary. It may not cover all possible information. If you have questions about this medicine, talk to your doctor, pharmacist, or health care provider.  2023 Elsevier/Gold Standard (2021-08-26 00:00:00) Hypotension As your heart beats, it forces blood through your body. This force is called blood pressure. If you have hypotension, you have low blood pressure.  When your blood pressure is too low, you may not get enough blood to your brain or other parts of your body. This may cause you to feel weak, light-headed, have a fast heartbeat, or even faint. Low blood pressure may be harmless, or it may cause serious problems. What are the causes? Blood loss. Not enough water in the body (dehydration). Heart problems. Hormone problems. Pregnancy. A very bad infection. Not having enough of certain nutrients. Very bad allergic reactions. Certain medicines. What increases the risk? Age. The risk increases as you get older. Conditions that affect the heart or the brain  and spinal cord (central nervous system). What are the signs or symptoms? Feeling: Weak. Light-headed. Dizzy. Tired (fatigued). Blurred vision. Fast heartbeat. Fainting, in very bad cases. How is this treated? Changing your diet. This may involve drinking more water or including more salt (sodium) in your diet by eating high-salt foods. Taking medicines to raise your blood pressure. Changing how much you take (the dosage) of some of your medicines. Wearing compression stockings. These stockings help to prevent blood clots and reduce swelling in your legs. In some cases, you may need to go to the hospital to: Receive fluids through an IV tube. Receive donated blood through an IV tube (transfusion). Get treated for an infection or heart problems, if this applies. Be monitored while medicines that you are taking wear off. Follow these instructions at home: Eating and drinking  Drink enough fluids to keep your pee (urine)  pale yellow. Eat a healthy diet. Follow instructions from your doctor about what you can eat or drink. A healthy diet includes: Fresh fruits and vegetables. Whole grains. Low-fat (lean) meats. Low-fat dairy products. If told, include more salt in your diet. Do not add extra salt to your diet unless your doctor tells you to. Eat small meals often. Avoid standing up quickly after you eat. Medicines Take over-the-counter and prescription medicines only as told by your doctor. Follow instructions from your doctor about changing how much you take of your medicines, if this applies. Do not stop or change any of your medicines on your own. General instructions  Wear compression stockings as told by your doctor. Get up slowly from lying down or sitting. Avoid hot showers and a lot of heat as told by your doctor. Return to your normal activities when your doctor says that it is safe. Do not smoke or use any products that contain nicotine or tobacco. If you need help quitting, ask your doctor. Keep all follow-up visits. Contact a doctor if: You vomit. You have watery poop (diarrhea). You have a fever for more than 2-3 days. You feel more thirsty than normal. You feel weak and tired. Get help right away if: You have chest pain. You have a fast or uneven heartbeat. You lose feeling (have numbness) in any part of your body. You cannot move your arms or your legs. You have trouble talking. You get sweaty or feel light-headed. You faint. You have trouble breathing. You have trouble staying awake. You feel mixed up (confused). These symptoms may be an emergency. Get help right away. Call 911. Do not wait to see if the symptoms will go away. Do not drive yourself to the hospital. Summary Hypotension is also called low blood pressure. It is when the force of blood pumping through your body is too weak. Hypotension may be harmless, or it may cause serious problems. Treatment may include  changing your diet and medicines, and wearing compression stockings. In very bad cases, you may need to go to the hospital. This information is not intended to replace advice given to you by your health care provider. Make sure you discuss any questions you have with your health care provider. Document Revised: 08/04/2021 Document Reviewed: 08/04/2021 Elsevier Patient Education  [place]. Bipolar I Disorder Bipolar I disorder is a mental health disorder in which a person has episodes of emotional highs (mania) and may also have episodes of lows (depression). Bipolar I disorder differs from other bipolar disorders in that it involves extreme episodes of mania (manic episodes). These episodes last  at least one week or involve severe symptoms that require a hospital stay to keep the person safe. What are the causes? The cause of this condition is not known. What increases the risk? The following factors may make you more likely to develop this condition: Having a family member with the disorder. Having an imbalance of certain chemicals in the brain (neurotransmitters). Experiencing stress, such as from illness, financial problems, or a death. Having certain conditions that affect the brain or spinal cord (neurologic conditions). Having had a brain injury (trauma). What are the signs or symptoms? Symptoms of bipolar I disorder include the following: Symptoms of mania Very high self-esteem or self-confidence. Less need for sleep. Unusual talkativeness. Speech may be very fast. Racing thoughts with quick shifts between topics that may or may not be related (flight of ideas). Being much more able or much less able to concentrate. Increased purposeful activity, such as work, studies, or social activity. Increased agitation. This could be pacing, squirming, fidgeting, or finger and toe tapping. Impulsive behavior and poor judgment. These may lead to high-risk activities that are sexual,  financial, or physical. Symptoms of depression Extreme sadness, uncontrollable crying, or feeling hopeless, worthless, or numb. Sleep problems, such as trouble falling asleep or staying asleep (insomnia), waking early, or sleeping too much. No longer enjoying things you used to enjoy. Isolation, or spending time alone often. Lack of energy or motivation, and moving more slowly than normal. Trouble making decisions. Increased appetite or loss of appetite. Thoughts of death, or wanting to harm yourself. Sometimes, you may have a mixed mood. This means having symptoms of mania and depression at the same time. Stress can often trigger these symptoms. How is this diagnosed? This condition may be diagnosed based on a mental health evaluation that includes a review of: Emotional episodes. Medical history. Use of alcohol, drugs, and prescription medicines. Certain medical conditions and substances can cause secondary bipolar disorder. This has symptoms like the symptoms of bipolar disorder. Your health care provider may ask you to take a short test to help understand your symptoms. You may also be asked to follow up with a mental health provider for further evaluation or to start treatment. How is this treated?     This condition is a long-term (chronic) illness. It is often managed with ongoing treatment rather than treatment only when symptoms occur. A combination of treatments is often used. Treatment may include: Medicines, usually medicines called mood stabilizers. Medicines can be prescribed by a health care provider who specializes in treating mental health disorders (psychiatrist). If symptoms occur during use of a mood stabilizer, other medicines may be added. Talk therapy (psychotherapy) to help you manage bipolar disorder. Talk therapy includes cognitive behavioral therapy (CBT) and family therapy. Psychoeducation. This helps you and others understand how your disorder is managed. Include  friends and family in educational sessions so they learn how best to support you. Methods of managing your condition, such as journaling or relaxation exercises. Exercises include: Yoga. Meditation. Deep breathing. Lifestyle changes, such as: Limiting alcohol and drug use. Exercising regularly. Setting a regular bedtime and wake time. Eating a healthy diet. Electroconvulsive therapy (ECT). This is a procedure in which electricity is applied to the brain through the scalp. ECT may be used for severe bipolar disorder when medicine and psychotherapy work too slowly or do not work. Follow these instructions at home: Activity Return to your normal activities as told by your health care provider. Find activities that you enjoy, and  make time to do them. Get regular exercise. Lifestyle  Follow a set schedule for eating and sleeping. Eat a healthy diet that includes fresh fruits and vegetables, whole grains, low-fat dairy, and lean meat. Get at least 7-8 hours of sleep each night. Avoid using products that contain nicotine or tobacco. If you want help quitting, ask your health care provider. Avoid alcohol and drugs. They can affect how medicine works and make symptoms worse. General instructions Take over-the-counter and prescription medicines only as told by your health care provider. You may think about stopping your medicine, but you need to take all your medicine as prescribed. This helps manage your symptoms. Consider joining a support group. Your health care provider may be able to recommend one. Talk with your family and friends about your treatment goals and how loved ones can help. Keep all follow-up visits. This is important. Where to find more information [employer] on Mental Illness: nami.[place]: https://www.frey.org/ Contact a health care provider if: Your symptoms get worse, or your loved ones tell you that your symptoms are getting worse. You  have uncomfortable side effects from your medicine. You have trouble sleeping. You have trouble doing daily activities. You feel unsafe in your surroundings. You are using alcohol or drugs to manage your symptoms. Get help right away if: You have new symptoms. You have thoughts about harming yourself or others. You are considering suicide. If you ever feel like you may hurt yourself or others, or have thoughts about taking your own life, get help right away. Go to your nearest emergency department or: Call your local emergency services (911 in the [place] Call a suicide crisis helpline, such as the [place] at 321-762-4121 or 988 in the [place]. This is open 24 hours a day in the [place] Text the Crisis Text Line at 504-260-3719 (in the [place].). Summary Bipolar I disorder is a lifelong mental health disorder in which a person has episodes of mania and depression. Treatment for this disorder involves a combination of treatments such as medicines and talk and behavioral therapies. Include friends and family in educational sessions so they know how best to support you. Get help right away if you are considering suicide. This information is not intended to replace advice given to you by your health care provider. Make sure you discuss any questions you have with your health care provider. Document Revised: 07/10/2021 Document Reviewed: 06/05/2021 Elsevier Patient Education  [place] Stress Reduction Mindfulness-based stress reduction (MBSR) is a program that helps people learn to practice mindfulness. Mindfulness is the practice of consciously paying attention to the present moment. MBSR focuses on developing self-awareness, which lets you respond to life stress without judgment or negative feelings. It can be learned and practiced through techniques such as education, breathing exercises, meditation, and yoga. MBSR includes several mindfulness techniques  in one program. MBSR works best when you understand the treatment, are willing to try new things, and can commit to spending time practicing what you learn. MBSR training may include learning about: How your feelings, thoughts, and reactions affect your body. New ways to respond to things that cause negative thoughts to start (triggers). How to notice your thoughts and let go of them. Practicing awareness of everyday things that you normally do without thinking. The techniques and goals of different types of meditation. What are the benefits of MBSR? MBSR can have many benefits, which include helping you to: Develop self-awareness. This means  knowing and understanding yourself. Learn skills and attitudes that help you to take part in your own health care. Learn new ways to care for yourself. Be more accepting about how things are, and let things go. Be less judgmental and approach things with an open mind. Be patient with yourself and trust yourself more. MBSR has also been shown to: Reduce negative emotions, such as sadness, overwhelm, and worry. Improve memory and focus. Change how you sense and react to pain. Boost your body's ability to fight infections. Help you connect better with other people. Improve your sense of well-being. How to practice mindfulness To do a basic awareness exercise: Find a comfortable place to sit. Pay attention to the present moment. Notice your thoughts, feelings, and surroundings just as they are. Avoid judging yourself, your feelings, or your surroundings. Make note of any judgment that comes up and let it go. Your mind may wander, and that is okay. Make note of when your thoughts drift, and return your attention to the present moment. To do basic mindfulness meditation: Find a comfortable place to sit. This may include a stable chair or a firm floor cushion. Sit upright with your back straight. Let your arms fall next to your sides, with your hands  resting on your legs. If you are sitting in a chair, rest your feet flat on the floor. If you are sitting on a cushion, cross your legs in front of you. Keep your head in a neutral position with your chin dropped slightly. Relax your jaw and rest the tip of your tongue on the roof of your mouth. Drop your gaze to the floor or close your eyes. Breathe normally and pay attention to your breath. Feel the air moving in and out of your nose. Feel your belly expanding and relaxing with each breath. Your mind may wander, and that is okay. Make note of when your thoughts drift, and return your attention to your breath. Avoid judging yourself, your feelings, or your surroundings. Make note of any judgment or feelings that come up, let them go, and bring your attention back to your breath. When you are ready, lift your gaze or open your eyes. Pay attention to how your body feels after the meditation. Follow these instructions at home:  Find a local in-person or online MBSR program. Set aside some time regularly for mindfulness practice. Practice every day if you can. Even 10 minutes of practice is helpful. Find a mindfulness practice that works best for you. This may include one or more of the following: Meditation. This involves focusing your mind on a certain thought or activity. Breathing awareness exercises. These help you to stay present by focusing on your breath. Body scan. For this practice, you lie down and pay attention to each part of your body from head to toe. You can identify tension and soreness and consciously relax parts of your body. Yoga. Yoga involves stretching and breathing, and it can improve your ability to move and be flexible. It can also help you to test your body's limits, which can help you release stress. Mindful eating. This way of eating involves focusing on the taste, texture, color, and smell of each bite of food. This slows down eating and helps you feel full sooner. For  this reason, it can be an important part of a weight loss plan. Find a podcast or recording that provides guidance for breathing awareness, body scan, or meditation exercises. You can listen to these any time when  you have a free moment to rest without distractions. Follow your treatment plan as told by your health care provider. This may include taking regular medicines and making changes to your diet or lifestyle as recommended. Where to find more information You can find more information about MBSR from: Your health care provider. Community-based meditation centers or programs. Programs offered near you. Summary Mindfulness-based stress reduction (MBSR) is a program that teaches you how to consciously pay attention to the present moment. It is used to help you deal better with daily stress, feelings, and pain. MBSR focuses on developing self-awareness, which allows you to respond to life stress without judgment or negative feelings. MBSR programs may involve learning different mindfulness practices, such as breathing exercises, meditation, yoga, body scan, or mindful eating. Find a mindfulness practice that works best for you, and set aside time for it on a regular basis. This information is not intended to replace advice given to you by your health care provider. Make sure you discuss any questions you have with your health care provider. Document Revised: 07/24/2021 Document Reviewed: 07/24/2021 Elsevier Patient Education  [place].

## 2023-03-23 NOTE — Chronic Care Management (AMB) (Signed)
Chronic Care Management   CCM RN Visit Note  03/23/2023 Name: Stacey Duncan MRN: [id number] DOB: 01-25-1931  Subjective: Stacey Duncan is a 89 y.o. year old female who is a primary care patient of [name], DO. The patient was referred to the Chronic Care Management team for assistance with care management needs subsequent to provider initiation of CCM services and plan of care.    Today's Visit:  Engaged with patient by telephone for initial visit.     SDOH Interventions Today    Flowsheet Row Most Recent Value  SDOH Interventions   Food Insecurity Interventions Intervention Not Indicated  Housing Interventions Intervention Not Indicated  Transportation Interventions Intervention Not Indicated  Utilities Interventions Intervention Not Indicated  Alcohol Usage Interventions Intervention Not Indicated (Score <7)  Financial Strain Interventions Intervention Not Indicated  Physical Activity Interventions Other (Comments)  [no structured activity, encouraged activity]  Stress Interventions Other (Comment), Provide Counseling  [the patient doses puzzles and works through it, referral with LCSW coming up]  Social Connections Interventions Intervention Not Indicated         Goals Addressed             This Visit's Progress    CCM Expected Outcome:  Monitor, Self-Manage and Reduce Symptoms of Afib       Current Barriers:  Chronic Disease Management support and education needs related to effective management of AFIB  Planned Interventions: Provider order and care plan reviewed. Collaborated with PharmD regarding patient care and plan. Counseled on increased risk of stroke due to Afib and benefits of anticoagulation for stroke prevention           Reviewed importance of adherence to anticoagulant exactly as prescribed Advised patient to discuss changes in AFIB, questions, or concerns with provider Counseled on bleeding risk associated with AFIB and  importance of self-monitoring for signs/symptoms of bleeding Counseled on avoidance of NSAIDs due to increased bleeding risk with anticoagulants Counseled on importance of regular laboratory monitoring as prescribed Counseled on seeking medical attention after a head injury or if there is blood in the urine/stool Afib action plan reviewed Screening for signs and symptoms of depression related to chronic disease state Assessed social determinant of health barriers  Symptom Management: Take medications as prescribed   Attend all scheduled provider appointments Call provider office for new concerns or questions  call the Suicide and Crisis Lifeline: 988 call the [place] National Suicide Prevention Lifeline: 667-636-7831 or TTY: (505)875-2816 TTY 934-197-6674) to talk to a trained counselor call 1-800-273-TALK (toll free, 24 hour hotline) if experiencing a Mental Health or [place]  - make a plan to eat healthy - keep all lab appointments - take medicine as prescribed  Follow Up Plan: Telephone follow up appointment with care management team member scheduled for: 05-11-2023 at 145 pm       CCM Expected Outcome:  Monitor, Self-Manage and Reduce Symptoms of: depression, anxiety, stress       Current Barriers:  Knowledge Deficits related to resources to help with questions she has and help for her adult son who lives with her and her husband that is 6 and is bipolar  Care Coordination needs related to social work support and resources to help with managing her depression, anxiety, and stress and get help with needs and concerns related to her adult son in a patient with depression, anxiety, and stress Chronic Disease Management support and education needs related to effective management of depression, anxiety, and stress  Planned Interventions: Evaluation of current treatment plan related to depression, anxiety, and stress and patient's adherence to plan as established by  provider Advised patient to call the office for changes in her mood, anxiety, depression, stress level, or other mental health needs. Provided education to patient re: mindfulness, journal writing, and making sure she practices self care and getting what she needs to manage her mental health and well being. The patient is concerned about her adult son and what may happen to him after her and her husband are gone. She states that he is 31 and has never held down a steady job. He has been married in the past for about 5 years but that did not last. He does not have children. [name] lives with her and her husband. He is a very social person and he takes them to their appointments but he would not be able to care for himself financially. She and her husband are going tomorrow to see about getting him disability. She is hopeful the social worker will be able to assist with resources as well.  Reviewed medications with patient and discussed compliance. The patient is compliant with medications. She is still waiting for her trazodone to come as it is through mail order.  Collaborated with pcp and office CMA regarding Vitamin B12 injections Provided patient with bipolar  educational materials related to the patient wanting information on bipolar to help her understand her sons condition as well as possible Reviewed scheduled/upcoming provider appointments including 03-31-2023 at 10 am for Vitamin B12 injection and 06-18-2023 at 240 pm with pcp Social Work referral for assistance for resources for her adult son who is bipolar 1 and also for effective management of her mental health and well being. Appointment for 04-01-2023 at 130 pm with LCSW- reminder provided today Discussed plans with patient for ongoing care management follow up and provided patient with direct contact information for care management team Advised patient to discuss changes in her mood, anxiety, stress, depression or mental health with  provider Screening for signs and symptoms of depression related to chronic disease state  Assessed social determinant of health barriers  Symptom Management: Take medications as prescribed   Attend all scheduled provider appointments Call provider office for new concerns or questions  call the Suicide and Crisis Lifeline: 988 call the [place] National Suicide Prevention Lifeline: (478)245-0028 or TTY: 612-496-9599 TTY 850-318-2967) to talk to a trained counselor call 1-800-273-TALK (toll free, 24 hour hotline) if experiencing a Mental Health or [place]   Follow Up Plan: Telephone follow up appointment with care management team member scheduled for:  05-11-2023 at 145 pm       CCM Expected Outcome:  Monitor, Self-Manage, and Reduce Symptoms of Hypertension       Current Barriers:  Care Coordination needs related to changes in her blood pressures and sx and sx that she is having that may be related to blood pressures or other chronic conditions  in a patient with HTN and other heart conditions Chronic Disease Management support and education needs related to effective management of HTN  BP Readings from Last 3 Encounters:  03/17/23 108/62  03/16/23 (!) 108/50  03/11/23 116/74     Planned Interventions: Evaluation of current treatment plan related to hypertension self management and patient's adherence to plan as established by provider. The patient saw her cardiologist last week and one of her medications was put on hold. She states that her blood pressures had been dropping lower. The  patient is keeping track of it at home and today it has been the highest at 157/50. She states she can't explain it but her head feels "heavy". Her B12 level was low at the pcp last week and after talking to her she is receptive to trying the B12 injections. Were able to get those set up with the office today and she will have her first injection next Wednesday at around 10 am. She has been  instructed to go to the pharmacy and pick up the vial of B12 and bring to the office with her. The patient verbalized understanding. ;   Provided education to patient re: stroke prevention, s/s of heart attack and stroke; Reviewed prescribed diet heart healthy diet Reviewed medications with patient and discussed importance of compliance. The patient is complaint with medications. Denies any new concerns related to medications. ;  Discussed plans with patient for ongoing care management follow up and provided patient with direct contact information for care management team; Advised patient, providing education and rationale, to monitor blood pressure daily and record, calling PCP for findings outside established parameters;  Reviewed scheduled/upcoming provider appointments including: 03-31-2023 at 10 am to get Vitamin B12 injections, 06-18-2023 at 240 pm Advised patient to discuss changes in her blood pressures, questions, or concerns  with provider; Provided education on prescribed diet heart healthy diet;  Discussed complications of poorly controlled blood pressure such as heart disease, stroke, circulatory complications, vision complications, kidney impairment, sexual dysfunction;  Screening for signs and symptoms of depression related to chronic disease state;  Assessed social determinant of health barriers;   Symptom Management: Take medications as prescribed   Attend all scheduled provider appointments Call provider office for new concerns or questions  call the Suicide and Crisis Lifeline: 988 call the [place] National Suicide Prevention Lifeline: 978-009-8373 or TTY: (270)525-7686 TTY 352-646-9649) to talk to a trained counselor call 1-800-273-TALK (toll free, 24 hour hotline) if experiencing a Mental Health or [place]  learn about high blood pressure take blood pressure log to all doctor appointments call doctor for signs and symptoms of high blood pressure develop an  action plan for high blood pressure keep all doctor appointments take medications for blood pressure exactly as prescribed report new symptoms to your doctor  Follow Up Plan: Telephone follow up appointment with care management team member scheduled for: 05-11-2023 at 145 pm          Plan:Telephone follow up appointment with care management team member scheduled for:  05-11-2023 at 145 pm  Noreene Larsson RN, MSN, CCM RN Care Manager  Chronic Care Management Direct Number: [phone]

## 2023-03-24 ENCOUNTER — Telehealth: Payer: Self-pay | Admitting: Cardiovascular Disease

## 2023-03-24 NOTE — Telephone Encounter (Signed)
Pt c/o BP issue: STAT if pt c/o blurred vision, one-sided weakness or slurred speech  1. What are your last 5 BP readings?  3/23 132/59 3/24 114/54 3/25 112/63 3/26 147/53         116/60           92/56 3/27 153/51  2. Are you having any other symptoms (ex. Dizziness, headache, blurred vision, passed out)? Patient states when her blood pressure was 92/56 her balance wasn't right.   3. What is your BP issue? She states she was recently taken off of hydralazine when she was in the office last week.  She states she took one last night when after her blood pressure dropped.

## 2023-03-24 NOTE — Telephone Encounter (Signed)
Patient has been made aware and verbalized her understanding.   Minna Merritts, MD  [facility] Triage5 minutes ago (12:20 PM)    Blood pressures are somewhat labile (up and down) Would  continue current medication regiment If blood pressure runs low would not take any additional medication Would  hydrate if pressure is low, sit or lay down until numbers come back up Thx TGollan

## 2023-03-24 NOTE — Telephone Encounter (Signed)
Returned the call to the patient. She was calling to give an update on her blood pressures since stopping the Hydralazine. She is not sure what time of day the below readings were taken.   She did say that last night, her blood pressure was 92/56 so she "took a Hydralazine to bring it back up". She has been advised and educated that this medication is a blood pressure lowering medication.   This morning her blood pressure was 153/51 and heart rate 70 before medications.   3/23 132/59 3/24 114/54 3/25 112/63 3/26 147/53         116/60           92/56 3/27 153/51  Per the patient, her B12 was on the lower norm and she will be starting a B12 injection today at her PCP office. She has hopes that this will make her feel better as she has been having chronic fatigue and weakness.

## 2023-03-28 DIAGNOSIS — I4891 Unspecified atrial fibrillation: Secondary | ICD-10-CM | POA: Diagnosis not present

## 2023-03-28 DIAGNOSIS — F32A Depression, unspecified: Secondary | ICD-10-CM

## 2023-03-28 DIAGNOSIS — I10 Essential (primary) hypertension: Secondary | ICD-10-CM

## 2023-03-29 ENCOUNTER — Encounter: Payer: Medicare PPO | Admitting: Licensed Clinical Social Worker

## 2023-03-31 ENCOUNTER — Ambulatory Visit (INDEPENDENT_AMBULATORY_CARE_PROVIDER_SITE_OTHER): Payer: Medicare PPO

## 2023-03-31 DIAGNOSIS — E538 Deficiency of other specified B group vitamins: Secondary | ICD-10-CM | POA: Diagnosis not present

## 2023-03-31 MED ORDER — CYANOCOBALAMIN 1000 MCG/ML IJ SOLN
1000.0000 ug | Freq: Once | INTRAMUSCULAR | Status: AC
Start: 2023-03-31 — End: 2023-03-31
  Administered 2023-03-31: 1000 ug via INTRAMUSCULAR

## 2023-04-01 ENCOUNTER — Other Ambulatory Visit: Payer: Self-pay | Admitting: Cardiovascular Disease

## 2023-04-01 ENCOUNTER — Ambulatory Visit: Payer: Self-pay | Admitting: Licensed Clinical Social Worker

## 2023-04-01 DIAGNOSIS — I4891 Unspecified atrial fibrillation: Secondary | ICD-10-CM

## 2023-04-01 NOTE — Patient Instructions (Signed)
Visit Information  Thank you for taking time to visit with me today. Please don't hesitate to contact me if I can be of assistance to you.   Following are the goals we discussed today:   Goals Addressed             This Visit's Progress    Reduce Stress Related to Son       Activities and task to complete in order to accomplish goals.   Call or go to Department of Social Services to apply for Medicaid for son, [name] Keep appointment with [place] next week to apply for disability for [name] all upcoming appointment discussed today We will discuss more about coping skill and other options during our next call          Our next appointment is by telephone on 04/13/23 at 11:00  Please call the care guide team at [phone] if you need to cancel or reschedule your appointment.   If you are experiencing a Mental Health or [place] or need someone to talk to, please call the Suicide and Crisis Lifeline: 988 call the [place] National Suicide Prevention Lifeline: 9144334081 or TTY: 678 689 8562 TTY 289-887-1072) to talk to a trained counselor call 1-800-273-TALK (toll free, 24 hour hotline)   The patient verbalized understanding of instructions, educational materials, and care plan provided today and DECLINED offer to receive copy of patient instructions, educational materials, and care plan.   [name], [place] [phone]

## 2023-04-01 NOTE — Patient Outreach (Signed)
  Care Coordination  Initial Visit Note   04/01/2023 Name: Stacey Duncan MRN: [id number] DOB: Oct 12, 1931  Stacey Duncan is a 89 y.o. year old female who sees Stacey Hauser, DO for primary care. I spoke with  Stacey Duncan by phone today.  What matters to the patients health and wellness today?  Reducing stress related to son    Goals Addressed             This Visit's Progress    Reduce Stress Related to Son       Activities and task to complete in order to accomplish goals.   Call or go to Department of Social Services to apply for Medicaid for son, Stacey Duncan Keep appointment with Stacey Duncan next week to apply for disability for Stacey Duncan all upcoming appointment discussed today We will discuss more about coping skill and other options during our next call         SDOH assessments and interventions completed:  No  Care Coordination Interventions:  Yes, provided  Interventions Today    Flowsheet Row Most Recent Value  Chronic Disease   Chronic disease during today's visit Hypertension (HTN), Other  [Depression & Anxiety]  General Interventions   General Interventions Discussed/Reviewed General Interventions Discussed, Radio producer Interventions   Education Provided Provided Education  Provided Orthoptist On Mental Health/Coping with Illness, [employer], Location manager options for son/ apply for medicaid]  Mental Health Interventions   Mental Health Discussed/Reviewed Mental Health Discussed, Coping Strategies, Anxiety  [caregiver stress( emotional support, solution focused, active listening)]       Follow up plan: Follow up call scheduled for 2 weeks    Encounter Outcome:  Pt. Visit Completed   Stacey Duncan, [place] [phone]

## 2023-04-05 ENCOUNTER — Telehealth: Payer: Self-pay | Admitting: Family Medicine

## 2023-04-05 NOTE — Telephone Encounter (Signed)
We received after hours call on 4/6 that patient reported having symptoms of not walking straight and head feels heavy. She feels out of it and said that her blood pressure was low recently. Her BP reading at that date was 136/72.  She is asking if B12 therapy with injection weekly can cause these side effects.  Her last B12 level was 294 on 03/15/23. Low normal range.  Please notify patient that I do not believe the B12 shots would cause these side effects. But she may stop them if she prefers to stop. We can repeat the B12 level in the near future. But that is not a top priority right now, because she was not extremely low before.  I would suggest maybe the Trazodone mood/sleep medication that I ordered for her on 03/17/23 may be causing her side effects?  Trazodone can cause the following side effects  Nervous system: Dizziness (20% to 28%), drowsiness (24% to 41%), fatigue (6% to 11%), headache (10% to 20%), nervousness (15%)   She may discontinue Trazodone and see if side effects resolve.  Stacey Pilar, DO [facility] 04/05/2023, 6:28 PM

## 2023-04-06 NOTE — Telephone Encounter (Signed)
Called patient reports she never started the Trazodone. The off balance feeling is worse when you stand up.  She reports that she had a good day yesterday and symptoms were not as severe.    Patient states that she had this same feeling in when she was 89 years old and she did not cry at all at funeral.  She said she went doctor and talked to the doctor and she cried a lot and she got better after that.  After speaking with patient I advised patient to come in for an appointment to discuss.  Offered her an appointment today, 04/06/23 at 4:00.  She was unable to get her today and made appointment for 04/13/23 at 11:00.

## 2023-04-07 ENCOUNTER — Ambulatory Visit: Payer: Medicare PPO

## 2023-04-08 ENCOUNTER — Ambulatory Visit: Payer: Medicare PPO

## 2023-04-13 ENCOUNTER — Ambulatory Visit: Payer: Self-pay | Admitting: Licensed Clinical Social Worker

## 2023-04-13 ENCOUNTER — Ambulatory Visit (INDEPENDENT_AMBULATORY_CARE_PROVIDER_SITE_OTHER): Payer: Medicare PPO | Admitting: Family Medicine

## 2023-04-13 ENCOUNTER — Encounter: Payer: Self-pay | Admitting: Family Medicine

## 2023-04-13 VITALS — BP 102/58 | HR 69 | Ht 62.0 in | Wt 139.0 lb

## 2023-04-13 DIAGNOSIS — M150 Primary generalized (osteo)arthritis: Secondary | ICD-10-CM

## 2023-04-13 DIAGNOSIS — I480 Paroxysmal atrial fibrillation: Secondary | ICD-10-CM

## 2023-04-13 DIAGNOSIS — I10 Essential (primary) hypertension: Secondary | ICD-10-CM | POA: Diagnosis not present

## 2023-04-13 DIAGNOSIS — M797 Fibromyalgia: Secondary | ICD-10-CM

## 2023-04-13 DIAGNOSIS — M159 Polyosteoarthritis, unspecified: Secondary | ICD-10-CM | POA: Diagnosis not present

## 2023-04-13 DIAGNOSIS — E032 Hypothyroidism due to medicaments and other exogenous substances: Secondary | ICD-10-CM | POA: Diagnosis not present

## 2023-04-13 NOTE — Progress Notes (Signed)
Subjective:    Patient ID: Stacey Duncan, female    DOB: [birth year], 89 y.o.   MRN: [id number]  Stacey Duncan is a 89 y.o. female presenting on 04/13/2023 for Fatigue (Pt.states balance has been off for about a month. Its been going on for a while before that off and on. )   HPI  Fatigue / Drained / Feels Shakey or Tremors Internally Same as last visit 03/17/23, we discussed her concerns with fatigue and feeling tired vs worn out exhausted Only able to do 1 task or thing per day then feels run down and tired Last lab with TSH mild low but normal T4  Lab review 02/2023 CM[facility] normal CBC normal   Unintentional Weight Loss Reports 10 lbs in 6 months lost. Reduced appetite by her report, less sweets She feels her weight has maintained and feels appetite is overall good, our scale is down 1-2 lbs now  Fibromyalgia / Joint Pain Arthritis She takes Gabapentin 1 AM and 2 PM, needs new dose Does not make her groggy she reports but unsure.   Dyspnea / PAF Followed by Cardiology Saw them previously for dyspnea > ECHO > ordered Furosemide 20mg  daily and it has helped her breathing.   Pre-Diabetes A1c 5.9, similar to previous results 5.7 to 5.9 over past few years. 10 lb wt loss since October She has limited sweets in diet with goal to avoid excess carb starches sugars   Hypothyroidism Current results TSH 0.32, Free T4 1.3 result Continues on Levothyroxine 112 mcg daily   HYPERTENSION Cardiology discontinued her Hydralazine recently thought may be causing low BP and symptoms Seems this has helped   Vitamin B12 deficiency Result B12 294, goal is >200 but this is low normal. She did try 1 dose of B12 injection did not improve seems question if caused headache Has not resumed.   Major Depression, recurrent Insomnia secondary Improved mood now, see prior documentation. Her son is doing better, and she has better mood overall   Insomnia Sleeping better at night overall now.  Still tired during day She did not start Trazodone, it was rx last visit, she was cautious about potential risk of side effect and discontinued this med. Did not try it.  Fluoxetine 40mg  daily, in past failed dose 60mg  and also failed coming off of Fluoxetine too quickly. Also failed Wellbutrin side effects       04/13/2023   11:21 AM 03/17/2023    3:18 PM 07/23/2021    1:33 PM  Depression screen PHQ 2/9  Decreased Interest 1 0 1  Down, Depressed, Hopeless 0 2 1  PHQ - 2 Score 1 2 2   Altered sleeping 1 2 1   Tired, decreased energy 2 2 2   Change in appetite 1 1 2   Feeling bad or failure about yourself  0 0 0  Trouble concentrating 0 0 0  Moving slowly or fidgety/restless 1 0 0  Suicidal thoughts 0 0 0  PHQ-9 Score 6 7 7   Difficult doing work/chores Somewhat difficult Not difficult at all Somewhat difficult      04/13/2023   11:22 AM 06/24/2021    1:32 PM 09/24/2020   11:19 AM 02/27/2020   11:30 AM  GAD 7 : Generalized Anxiety Score  Nervous, Anxious, on Edge 1 1 2 1   Control/stop worrying 1 0 2 0  Worry too much - different things 1 0 2 1  Trouble relaxing 1 1 1 1   Restless 0 0 0 0  Easily annoyed or irritable 1 1 2 1   Afraid - awful might happen 1 1 2 1   Total GAD 7 Score 6 4 11 5   Anxiety Difficulty Somewhat difficult Somewhat difficult Somewhat difficult Not difficult at all      Social History   Tobacco Use   Smoking status: Never   Smokeless tobacco: Never  Vaping Use   Vaping Use: Never used  Substance Use Topics   Alcohol use: Yes    Alcohol/week: 7.0 standard drinks of alcohol    Types: 7 Glasses of wine per week    Comment: glass of wine daily   Drug use: No    Review of Systems Per HPI unless specifically indicated above     Objective:    BP (!) 102/58 (BP Location: Left Arm, Patient Position: Sitting)   Pulse 69   Ht 5\' 2"  (1.575 m)   Wt 139 lb (63 kg)   SpO2 94%   BMI 25.42 kg/m   Wt Readings from Last 3 Encounters:  04/13/23 139 lb (63  kg)  03/17/23 141 lb 12.8 oz (64.3 kg)  03/16/23 142 lb (64.4 kg)    Physical Exam Vitals and nursing note reviewed.  Constitutional:      General: She is not in acute distress.    Appearance: She is well-developed. She is not diaphoretic.     Comments: Well-appearing, comfortable, cooperative  HENT:     Head: Normocephalic and atraumatic.  Eyes:     General:        Right eye: No discharge.        Left eye: No discharge.     Conjunctiva/sclera: Conjunctivae normal.  Neck:     Thyroid: No thyromegaly.  Cardiovascular:     Rate and Rhythm: Normal rate and regular rhythm.     Heart sounds: Normal heart sounds. No murmur heard. Pulmonary:     Effort: Pulmonary effort is normal. No respiratory distress.     Breath sounds: Normal breath sounds. No wheezing or rales.  Musculoskeletal:        General: Normal range of motion.     Cervical back: Normal range of motion and neck supple.  Lymphadenopathy:     Cervical: No cervical adenopathy.  Skin:    General: Skin is warm and dry.     Findings: No erythema or rash.  Neurological:     Mental Status: She is alert and oriented to person, place, and time.  Psychiatric:        Behavior: Behavior normal.     Comments: Well groomed, good eye contact, normal speech and thoughts       Results for orders placed or performed in visit on 03/12/23  Vitamin B12  Result Value Ref Range   Vitamin B-12 294 200 - 1,100 pg/mL  T4, free  Result Value Ref Range   Free T4 1.3 0.8 - 1.8 ng/dL  TSH  Result Value Ref Range   TSH 0.32 (L) 0.40 - 4.50 mIU/L  Hemoglobin A1c  Result Value Ref Range   Hgb A1c MFr Bld 5.9 (H) <5.7 % of total Hgb   Mean Plasma Glucose 123 mg/dL   eAG (mmol/L) 6.8 mmol/L  Lipid panel  Result Value Ref Range   Cholesterol 139 <200 mg/dL   HDL 48 (L) > OR = 50 mg/dL   Triglycerides 161 <096 mg/dL   LDL Cholesterol (Calc) 69 mg/dL (calc)   Total CHOL/HDL Ratio 2.9 <5.0 (calc)   Non-HDL Cholesterol (Calc) 91 <  130  mg/dL (calc)  CBC with Differential/Platelet  Result Value Ref Range   WBC 8.3 3.8 - 10.8 Thousand/uL   RBC 4.25 3.80 - 5.10 Million/uL   Hemoglobin 12.9 11.7 - 15.5 g/dL   HCT 16.1 09.6 - 04.5 %   MCV 92.0 80.0 - 100.0 fL   MCH 30.4 27.0 - 33.0 pg   MCHC 33.0 32.0 - 36.0 g/dL   RDW 40.9 81.1 - 91.4 %   Platelets 300 140 - 400 Thousand/uL   MPV 10.0 7.5 - 12.5 fL   Neutro Abs 4,208 1,500 - 7,800 cells/uL   Lymphs Abs 3,212 850 - 3,900 cells/uL   Absolute Monocytes 540 200 - 950 cells/uL   Eosinophils Absolute 274 15 - 500 cells/uL   Basophils Absolute 66 0 - 200 cells/uL   Neutrophils Relative % 50.7 %   Total Lymphocyte 38.7 %   Monocytes Relative 6.5 %   Eosinophils Relative 3.3 %   Basophils Relative 0.8 %  COMPLETE METABOLIC PANEL WITH GFR  Result Value Ref Range   Glucose, Bld 93 65 - 99 mg/dL   BUN 9 7 - 25 mg/dL   Creat 7.82 9.56 - 2.13 mg/dL   eGFR 70 > OR = 60 YQ/MVH/8.46N6   BUN/Creatinine Ratio SEE NOTE: 6 - 22 (calc)   Sodium 140 135 - 146 mmol/L   Potassium 4.4 3.5 - 5.3 mmol/L   Chloride 104 98 - 110 mmol/L   CO2 29 20 - 32 mmol/L   Calcium 9.1 8.6 - 10.4 mg/dL   Total Protein 6.4 6.1 - 8.1 g/dL   Albumin 3.9 3.6 - 5.1 g/dL   Globulin 2.5 1.9 - 3.7 g/dL (calc)   AG Ratio 1.6 1.0 - 2.5 (calc)   Total Bilirubin 0.6 0.2 - 1.2 mg/dL   Alkaline phosphatase (APISO) 75 37 - 153 U/L   AST 16 10 - 35 U/L   ALT 13 6 - 29 U/L      Assessment & Plan:   Problem List Items Addressed This Visit     Essential hypertension - Primary   Fibromyalgia   Relevant Medications   gabapentin (NEURONTIN) 300 MG capsule   Hypothyroidism   Relevant Medications   levothyroxine (SYNTHROID) 100 MCG tablet   Osteoarthritis   Relevant Medications   gabapentin (NEURONTIN) 300 MG capsule   Paroxysmal atrial fibrillation    Reviewed similar issue to last month Uncertain exact etiology of her fatigue No significant organic etiology in blood results from before Seems less  likely directly due to sleep now she is sleeping better Appetite is improved, weight is stable, less likely nutritional Mood is improved as well, seems less likely direct cause with that, but it is probably component Did not take Trazodone, DC'd from list B12 did not help symptoms, maybe made headache worse, but she paused B12 injections  BP readings are low normal range. Already off Hydralazine. On low dose BB only, per Cardiology  Today we looked at her meds to see if med side effect causing her concerns.  Trial the following options   REDUCE Levothyroxine from 112 to , use current pill bottle, let me know if need pill bottle.  REDUCE Gabapentin to just take 2 at bedtime only, SKIP morning dose.  Can do new orders if need/when ready if these adjustments are helpful.  No orders of the defined types were placed in this encounter.     Follow up plan: Return if symptoms worsen or fail to improve.  Saralyn Pilar, DO [facility] 04/13/2023, 11:20 AM

## 2023-04-13 NOTE — Patient Instructions (Addendum)
Thank you for coming to the office today.  REDUCE Levothyroxine from 112 to , use current pill bottle, let me know if need pill bottle.  REDUCE Gabapentin to just take 2 at bedtime only, SKIP morning dose.  Let me know how it goes.  Please schedule a Follow-up Appointment to: Return if symptoms worsen or fail to improve.  If you have any other questions or concerns, please feel free to call the office or send a message through MyChart. You may also schedule an earlier appointment if necessary.  Additionally, you may be receiving a survey about your experience at our office within a few days to 1 week by e-mail or mail. We value your feedback.  Saralyn Pilar, DO [facility], [place]

## 2023-04-13 NOTE — Patient Instructions (Signed)
  It was a pleasure speaking with you today. Per your request a Care Coordination phone appointment is scheduled 04/16/23  Sammuel Hines, LCSW Social Work Care Coordination  [phone]

## 2023-04-13 NOTE — Patient Outreach (Signed)
  Care Coordination  Follow Up Visit Note   04/13/2023 Name: Stacey Duncan MRN: [id number] DOB: Apr 05, 1931  Stacey Duncan is a 89 y.o. year old female who sees Smitty Cords, DO for primary care. I spoke with  [name] by phone today.  What matters to the patients health and wellness today?  Getting appointment with PCP. Would like to reschedule.  SDOH assessments and interventions completed:  No   Care Coordination Interventions:  No, not indicated   Follow up plan: Follow up call scheduled for 04/16/23    Encounter Outcome:  Pt. Visit Completed   Sammuel Hines, LCSW Social Work Care Coordination  [name] [phone]

## 2023-04-16 ENCOUNTER — Ambulatory Visit: Payer: Self-pay | Admitting: Licensed Clinical Social Worker

## 2023-04-16 NOTE — Patient Instructions (Signed)
Visit Information  Thank you for taking time to visit with me today. Please don't hesitate to contact me if I can be of assistance to you.   Following are the goals we discussed today:   Goals Addressed             This Visit's Progress    Reduce Stress Related to Son       Activities and task to complete in order to accomplish goals.   Call or go to Department of Social Services to apply for Medicaid for son, [name] yo were able to keep your appointment with Social Security to apply for disability for [name] all upcoming appointment discussed today We will discuss more about coping skill and other options during our next call   Call your [facility] Medicare to inquire about your co-pay for therapy  Continue to work on your advance directive         Our next appointment is by telephone on 04/29/23 at 10:45  Please call the care guide team at [phone] if you need to cancel or reschedule your appointment.   If you are experiencing a Mental Health or Behavioral Health Crisis or need someone to talk to, please call the Suicide and Crisis Lifeline: 988 call the [place] National Suicide Prevention Lifeline: 303-672-4943 or TTY: (815) 034-6079 TTY 301-830-4989) to talk to a trained counselor call 1-800-273-TALK (toll free, 24 hour hotline)   The patient verbalized understanding of instructions, educational materials, and care plan provided today and DECLINED offer to receive copy of patient instructions, educational materials, and care plan.   Sammuel Hines, LCSW Social Work Care Coordination  [name] [phone]

## 2023-04-16 NOTE — Patient Outreach (Signed)
  Care Coordination  Follow Up Visit Note   04/16/2023 Name: Stacey Duncan MRN: [id number] DOB: [birth year]  Stacey Duncan is a 89 y.o. year old female who sees Stacey Cords, DO for primary care. I spoke with  Stacey Duncan by phone today.  What matters to the patients health and wellness today?  Continues to experience symptoms of anxiety which causes her to worry about things. Will continue to explore therapy as an option.   Goals Addressed             This Visit's Progress    Reduce Stress Related to Son       Activities and task to complete in order to accomplish goals.   Call or go to Department of Social Services to apply for Medicaid for son, Stacey Duncan yo were able to keep your appointment with Social Security to apply for disability for Stacey Duncan all upcoming appointment discussed today We will discuss more about coping skill and other options during our next call   Call your [facility] Medicare to inquire about your co-pay for therapy  Continue to work on your advance directive        SDOH assessments and interventions completed:  No  Care Coordination Interventions:  Yes, provided  Interventions Today    Flowsheet Row Most Recent Value  Chronic Disease   Chronic disease during today's visit Hypertension (HTN)  General Interventions   General Interventions Discussed/Reviewed Stacey Duncan, Communication with  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 questions patient has after visit with PCP]  Education Interventions   Education Provided Provided Education  Provided Verbal Education On Stacey Duncan  [Medicaid for son]  Mental Health Interventions   Mental Health Discussed/Reviewed Mental Health Discussed, Depression, Anxiety, Coping Strategies  [discussed therapy options,  solution focused / task centered]  Advanced Directive Interventions   Advanced Directives Discussed/Reviewed Advanced Directives Discussed, Advanced Care Planning  [has  all information and paper work she understands she needs to complete the form]       Follow up plan: Follow up call scheduled for 04/29/23    Encounter Outcome:  Pt. Visit Completed   Stacey Hines, LCSW Social Work Care Coordination  Stacey Duncan Stacey Duncan Stacey Duncan Stacey Duncan

## 2023-04-20 ENCOUNTER — Ambulatory Visit (INDEPENDENT_AMBULATORY_CARE_PROVIDER_SITE_OTHER): Payer: Medicare PPO | Admitting: Family Medicine

## 2023-04-20 DIAGNOSIS — E538 Deficiency of other specified B group vitamins: Secondary | ICD-10-CM | POA: Diagnosis not present

## 2023-04-20 MED ORDER — CYANOCOBALAMIN 1000 MCG/ML IJ SOLN
1000.0000 ug | Freq: Once | INTRAMUSCULAR | Status: AC
Start: 2023-04-20 — End: 2023-04-20
  Administered 2023-04-20: 1000 ug via INTRAMUSCULAR

## 2023-04-20 NOTE — Progress Notes (Unsigned)
Patient seen today for B12 injection.  She verbalized agreement.  Patient tolerated well.  Patient is due weekly for 2 weeks for B12 injections, then biweekly, then monthly if tolerating.  Patient was advised to bring her medication with her as we will no longer store it at the office.  Patient will need refill after fourth injection.

## 2023-04-22 ENCOUNTER — Ambulatory Visit (INDEPENDENT_AMBULATORY_CARE_PROVIDER_SITE_OTHER): Payer: Medicare PPO

## 2023-04-22 ENCOUNTER — Telehealth: Payer: Medicare PPO

## 2023-04-22 DIAGNOSIS — F339 Major depressive disorder, recurrent, unspecified: Secondary | ICD-10-CM

## 2023-04-22 DIAGNOSIS — I10 Essential (primary) hypertension: Secondary | ICD-10-CM

## 2023-04-22 DIAGNOSIS — I480 Paroxysmal atrial fibrillation: Secondary | ICD-10-CM

## 2023-04-22 DIAGNOSIS — F439 Reaction to severe stress, unspecified: Secondary | ICD-10-CM

## 2023-04-22 DIAGNOSIS — F411 Generalized anxiety disorder: Secondary | ICD-10-CM

## 2023-04-22 NOTE — Chronic Care Management (AMB) (Addendum)
Chronic Care Management   CCM RN Visit Note  04/22/2023 Name: Stacey Duncan MRN: [id number] DOB: 12-Jan-1931  Subjective: Stacey Duncan is a 89 y.o. year old female who is a primary care patient of Stacey Cords, DO. The patient was referred to the Chronic Care Management team for assistance with care management needs subsequent to provider initiation of CCM services and plan of care.    Today's Visit:  Engaged with patient by telephone for follow up visit.        Goals Addressed             This Visit's Progress    CCM Expected Outcome:  Monitor, Self-Manage and Reduce Symptoms of Afib       Current Barriers:  Chronic Disease Management support and education needs related to effective management of AFIB  Planned Interventions: Provider order and care plan reviewed. Collaborated with PharmD regarding patient care and plan. AFIB is stable. The patient is going to call the cardiologist about her blood pressure being low.  Counseled on increased risk of stroke due to Afib and benefits of anticoagulation for stroke prevention           Reviewed importance of adherence to anticoagulant exactly as prescribed Advised patient to discuss changes in AFIB, questions, or concerns with provider Counseled on bleeding risk associated with AFIB and importance of self-monitoring for signs/symptoms of bleeding Counseled on avoidance of NSAIDs due to increased bleeding risk with anticoagulants Counseled on importance of regular laboratory monitoring as prescribed Counseled on seeking medical attention after a head injury or if there is blood in the urine/stool Afib action plan reviewed Screening for signs and symptoms of depression related to chronic disease state Assessed social determinant of health barriers  Symptom Management: Take medications as prescribed   Attend all scheduled provider appointments Call provider office for new concerns or questions  call the Suicide  and Crisis Lifeline: 988 call the [place] National Suicide Prevention Lifeline: 786-465-7486 or TTY: 540-628-0460 TTY (978)087-2800) to talk to a trained counselor call 1-800-273-TALK (toll free, 24 hour hotline) if experiencing a Mental Health or Behavioral Health Crisis  - make a plan to eat healthy - keep all lab appointments - take medicine as prescribed  Follow Up Plan: Telephone follow up appointment with care management team member scheduled for: 05-19-2023 at 100 pm       CCM Expected Outcome:  Monitor, Self-Manage and Reduce Symptoms of: depression, anxiety, stress       Current Barriers:  Knowledge Deficits related to resources to help with questions she has and help for her adult son who lives with her and her husband that is 95 and is bipolar  Care Coordination needs related to social work support and resources to help with managing her depression, anxiety, and stress and get help with needs and concerns related to her adult son in a patient with depression, anxiety, and stress Chronic Disease Management support and education needs related to effective management of depression, anxiety, and stress  Planned Interventions: Evaluation of current treatment plan related to depression, anxiety, and stress and patient's adherence to plan as established by provider. The patient is really stressed out about making sure her son is okay when something happens to her and her husband. The patient is having some skin issues under her breast. She does not think it is fungus. Is planning on calling the dermatologist she goes to and see about getting in to be checked. Reflective listening and support  given.  Advised patient to call the office for changes in her mood, anxiety, depression, stress level, or other mental health needs. Provided education to patient re: mindfulness, journal writing, and making sure she practices self care and getting what she needs to manage her mental health and well being.  The patient is concerned about her adult son and what may happen to him after her and her husband are gone. She states that he is 35 and has never held down a steady job. He has been married in the past for about 5 years but that did not last. He does not have children. [name] lives with her and her husband. He is a very social person and he takes them to their appointments but he would not be able to care for himself financially. She has been to DSS and filled out paperwork, she is also working regularly with the LCSW. Education and support given.  Reviewed medications with patient and discussed compliance. The patient is compliant with medications. She is still waiting for her trazodone to come as it is through mail order.  Collaborated with pcp and office CMA regarding Vitamin B12 injections. Is getting Vit B12 injections without difficulty  Provided patient with bipolar  educational materials related to the patient wanting information on bipolar to help her understand her sons condition as well as possible Reviewed scheduled/upcoming provider appointments including 04-27-2023 for Vitamin B12 injection and 06-18-2023 at 240 pm with pcp Social Work referral for assistance for resources for her adult son who is bipolar 1 and also for effective management of her mental health and well being. Ongoing support from the LCSW for support and education Discussed plans with patient for ongoing care management follow up and provided patient with direct contact information for care management team Advised patient to discuss changes in her mood, anxiety, stress, depression or mental health with provider Screening for signs and symptoms of depression related to chronic disease state  Assessed social determinant of health barriers  Symptom Management: Take medications as prescribed   Attend all scheduled provider appointments Call provider office for new concerns or questions  call the Suicide and Crisis Lifeline:  988 call the [place] National Suicide Prevention Lifeline: 864-363-1598 or TTY: (517) 295-9114 TTY (636) 472-9640) to talk to a trained counselor call 1-800-273-TALK (toll free, 24 hour hotline) if experiencing a Mental Health or Behavioral Health Crisis   Follow Up Plan: Telephone follow up appointment with care management team member scheduled for:  05-19-2023 at 1 pm       CCM Expected Outcome:  Monitor, Self-Manage, and Reduce Symptoms of Hypertension       Current Barriers:  Care Coordination needs related to changes in her blood pressures and sx and sx that she is having that may be related to blood pressures or other chronic conditions  in a patient with HTN and other heart conditions Chronic Disease Management support and education needs related to effective management of HTN  BP Readings from Last 3 Encounters:  04/13/23 (!) 102/58  03/17/23 108/62  03/16/23 (!) 108/50     Planned Interventions: Evaluation of current treatment plan related to hypertension self management and patient's adherence to plan as established by provider. The patient saw her cardiologist last week and one of her medications was put on hold. She states that her blood pressures had been dropping lower. The patient states she is having low blood pressures at home too. She states she can't explain it but her head feels "heavy". Her  B12 level was low at the pcp last week and after talking to her she is receptive to trying the B12 injections. There have been adjustments in the medications from cardiologist and the pcp. Recommended she call her cardiologist and talk to them about her blood pressures remaining low even after stopping her blood pressure medications. The patient states that she will do this. Talked to her about orthostatic hypotension and making sure she is careful when changing position;   Provided education to patient re: stroke prevention, s/s of heart attack and stroke; Reviewed prescribed diet heart  healthy diet Reviewed medications with patient and discussed importance of compliance. The patient is complaint with medications. Denies any new concerns related to medications. ;  Discussed plans with patient for ongoing care management follow up and provided patient with direct contact information for care management team; Advised patient, providing education and rationale, to monitor blood pressure daily and record, calling PCP for findings outside established parameters;  Reviewed scheduled/upcoming provider appointments including: 04-27-2023  to get Vitamin B12 injections, 06-18-2023 at 240 pm Advised patient to discuss changes in her blood pressures, questions, or concerns  with provider; Provided education on prescribed diet heart healthy diet. Is losing weight and discussed monitoring dietary habits, making sure she is staying hydrated;  Discussed complications of poorly controlled blood pressure such as heart disease, stroke, circulatory complications, vision complications, kidney impairment, sexual dysfunction;  Screening for signs and symptoms of depression related to chronic disease state;  Assessed social determinant of health barriers;   Symptom Management: Take medications as prescribed   Attend all scheduled provider appointments Call provider office for new concerns or questions  call the Suicide and Crisis Lifeline: 988 call the [place] National Suicide Prevention Lifeline: 661 283 3455 or TTY: 5591749949 TTY [phone]) to talk to a trained counselor call 1-800-273-TALK (toll free, 24 hour hotline) if experiencing a Mental Health or Behavioral Health Crisis  learn about high blood pressure take blood pressure log to all doctor appointments call doctor for signs and symptoms of high blood pressure develop an action plan for high blood pressure keep all doctor appointments take medications for blood pressure exactly as prescribed report new symptoms to your doctor  Follow  Up Plan: Telephone follow up appointment with care management team member scheduled for: 05-19-2023 at 100pm          Plan:Telephone follow up appointment with care management team member scheduled for:  05-19-2023 at 1 pm  Alto Denver RN, MSN, CCM RN Care Manager  Chronic Care Management Direct Number: [phone]

## 2023-04-22 NOTE — Patient Instructions (Signed)
Please call the care guide team at [phone] if you need to cancel or reschedule your appointment.   If you are experiencing a Mental Health or Behavioral Health Crisis or need someone to talk to, please call the Suicide and Crisis Lifeline: 988 call the [place] National Suicide Prevention Lifeline: 256-189-9991 or TTY: 818-188-6230 TTY 405 350 6114) to talk to a trained counselor call 1-800-273-TALK (toll free, 24 hour hotline)   Following is a copy of the CCM Program Consent:  CCM service includes personalized support from designated clinical staff supervised by the physician, including individualized plan of care and coordination with other care providers 24/7 contact phone numbers for assistance for urgent and routine care needs. Service will only be billed when office clinical staff spend 20 minutes or more in a month to coordinate care. Only one practitioner may furnish and bill the service in a calendar month. The patient may stop CCM services at amy time (effective at the end of the month) by phone call to the office staff. The patient will be responsible for cost sharing (co-pay) or up to 20% of the service fee (after annual deductible is met)  Following is a copy of your full provider care plan:   Goals Addressed             This Visit's Progress    CCM Expected Outcome:  Monitor, Self-Manage and Reduce Symptoms of Afib       Current Barriers:  Chronic Disease Management support and education needs related to effective management of AFIB  Planned Interventions: Provider order and care plan reviewed. Collaborated with PharmD regarding patient care and plan. AFIB is stable. The patient is going to call the cardiologist about her blood pressure being low.  Counseled on increased risk of stroke due to Afib and benefits of anticoagulation for stroke prevention           Reviewed importance of adherence to anticoagulant exactly as prescribed Advised patient to discuss changes in  AFIB, questions, or concerns with provider Counseled on bleeding risk associated with AFIB and importance of self-monitoring for signs/symptoms of bleeding Counseled on avoidance of NSAIDs due to increased bleeding risk with anticoagulants Counseled on importance of regular laboratory monitoring as prescribed Counseled on seeking medical attention after a head injury or if there is blood in the urine/stool Afib action plan reviewed Screening for signs and symptoms of depression related to chronic disease state Assessed social determinant of health barriers  Symptom Management: Take medications as prescribed   Attend all scheduled provider appointments Call provider office for new concerns or questions  call the Suicide and Crisis Lifeline: 988 call the [place] National Suicide Prevention Lifeline: (858) 126-6120 or TTY: (437)809-2516 TTY 404-792-7579) to talk to a trained counselor call 1-800-273-TALK (toll free, 24 hour hotline) if experiencing a Mental Health or Behavioral Health Crisis  - make a plan to eat healthy - keep all lab appointments - take medicine as prescribed  Follow Up Plan: Telephone follow up appointment with care management team member scheduled for: 05-19-2023 at 100 pm       CCM Expected Outcome:  Monitor, Self-Manage and Reduce Symptoms of: depression, anxiety, stress       Current Barriers:  Knowledge Deficits related to resources to help with questions she has and help for her adult son who lives with her and her husband that is 25 and is bipolar  Care Coordination needs related to social work support and resources to help with managing her depression, anxiety, and stress  and get help with needs and concerns related to her adult son in a patient with depression, anxiety, and stress Chronic Disease Management support and education needs related to effective management of depression, anxiety, and stress  Planned Interventions: Evaluation of current treatment plan  related to depression, anxiety, and stress and patient's adherence to plan as established by provider. The patient is really stressed out about making sure her son is okay when something happens to her and her husband. The patient is having some skin issues under her breast. She does not think it is fungus. Is planning on calling the dermatologist she goes to and see about getting in to be checked. Reflective listening and support given.  Advised patient to call the office for changes in her mood, anxiety, depression, stress level, or other mental health needs. Provided education to patient re: mindfulness, journal writing, and making sure she practices self care and getting what she needs to manage her mental health and well being. The patient is concerned about her adult son and what may happen to him after her and her husband are gone. She states that he is 5 and has never held down a steady job. He has been married in the past for about 5 years but that did not last. He does not have children. [name] lives with her and her husband. He is a very social person and he takes them to their appointments but he would not be able to care for himself financially. She has been to DSS and filled out paperwork, she is also working regularly with the LCSW. Education and support given.  Reviewed medications with patient and discussed compliance. The patient is compliant with medications. She is still waiting for her trazodone to come as it is through mail order.  Collaborated with pcp and office CMA regarding Vitamin B12 injections. Is getting Vit B12 injections without difficulty  Provided patient with bipolar  educational materials related to the patient wanting information on bipolar to help her understand her sons condition as well as possible Reviewed scheduled/upcoming provider appointments including 04-27-2023 for Vitamin B12 injection and 06-18-2023 at 240 pm with pcp Social Work referral for assistance for  resources for her adult son who is bipolar 1 and also for effective management of her mental health and well being. Ongoing support from the LCSW for support and education Discussed plans with patient for ongoing care management follow up and provided patient with direct contact information for care management team Advised patient to discuss changes in her mood, anxiety, stress, depression or mental health with provider Screening for signs and symptoms of depression related to chronic disease state  Assessed social determinant of health barriers  Symptom Management: Take medications as prescribed   Attend all scheduled provider appointments Call provider office for new concerns or questions  call the Suicide and Crisis Lifeline: 988 call the [place] National Suicide Prevention Lifeline: (406) 308-3254 or TTY: 8313973378 TTY 770 222 2590) to talk to a trained counselor call 1-800-273-TALK (toll free, 24 hour hotline) if experiencing a Mental Health or Behavioral Health Crisis   Follow Up Plan: Telephone follow up appointment with care management team member scheduled for:  05-19-2023 at 1 pm       CCM Expected Outcome:  Monitor, Self-Manage, and Reduce Symptoms of Hypertension       Current Barriers:  Care Coordination needs related to changes in her blood pressures and sx and sx that she is having that may be related to blood pressures or other  chronic conditions  in a patient with HTN and other heart conditions Chronic Disease Management support and education needs related to effective management of HTN  BP Readings from Last 3 Encounters:  04/13/23 (!) 102/58  03/17/23 108/62  03/16/23 (!) 108/50     Planned Interventions: Evaluation of current treatment plan related to hypertension self management and patient's adherence to plan as established by provider. The patient saw her cardiologist last week and one of her medications was put on hold. She states that her blood pressures had been  dropping lower. The patient states she is having low blood pressures at home too. She states she can't explain it but her head feels "heavy". Her B12 level was low at the pcp last week and after talking to her she is receptive to trying the B12 injections. There have been adjustments in the medications from cardiologist and the pcp. Recommended she call her cardiologist and talk to them about her blood pressures remaining low even after stopping her blood pressure medications. The patient states that she will do this. Talked to her about orthostatic hypotension and making sure she is careful when changing position;   Provided education to patient re: stroke prevention, s/s of heart attack and stroke; Reviewed prescribed diet heart healthy diet Reviewed medications with patient and discussed importance of compliance. The patient is complaint with medications. Denies any new concerns related to medications. ;  Discussed plans with patient for ongoing care management follow up and provided patient with direct contact information for care management team; Advised patient, providing education and rationale, to monitor blood pressure daily and record, calling PCP for findings outside established parameters;  Reviewed scheduled/upcoming provider appointments including: 04-27-2023  to get Vitamin B12 injections, 06-18-2023 at 240 pm Advised patient to discuss changes in her blood pressures, questions, or concerns  with provider; Provided education on prescribed diet heart healthy diet. Is losing weight and discussed monitoring dietary habits, making sure she is staying hydrated;  Discussed complications of poorly controlled blood pressure such as heart disease, stroke, circulatory complications, vision complications, kidney impairment, sexual dysfunction;  Screening for signs and symptoms of depression related to chronic disease state;  Assessed social determinant of health barriers;   Symptom Management: Take  medications as prescribed   Attend all scheduled provider appointments Call provider office for new concerns or questions  call the Suicide and Crisis Lifeline: 988 call the [place] National Suicide Prevention Lifeline: 319-154-8268 or TTY: 8057677197 TTY (478)652-4967) to talk to a trained counselor call 1-800-273-TALK (toll free, 24 hour hotline) if experiencing a Mental Health or Behavioral Health Crisis  learn about high blood pressure take blood pressure log to all doctor appointments call doctor for signs and symptoms of high blood pressure develop an action plan for high blood pressure keep all doctor appointments take medications for blood pressure exactly as prescribed report new symptoms to your doctor  Follow Up Plan: Telephone follow up appointment with care management team member scheduled for: 05-19-2023 at 100pm          The patient verbalized understanding of instructions, educational materials, and care plan provided today and DECLINED offer to receive copy of patient instructions, educational materials, and care plan.  Telephone follow up appointment with care management team member scheduled for: 05-19-2023 at 1 pm

## 2023-04-27 ENCOUNTER — Ambulatory Visit (INDEPENDENT_AMBULATORY_CARE_PROVIDER_SITE_OTHER): Payer: Medicare PPO

## 2023-04-27 DIAGNOSIS — I4891 Unspecified atrial fibrillation: Secondary | ICD-10-CM

## 2023-04-27 DIAGNOSIS — F32A Depression, unspecified: Secondary | ICD-10-CM

## 2023-04-27 DIAGNOSIS — E538 Deficiency of other specified B group vitamins: Secondary | ICD-10-CM | POA: Diagnosis not present

## 2023-04-27 DIAGNOSIS — I10 Essential (primary) hypertension: Secondary | ICD-10-CM

## 2023-04-27 MED ORDER — CYANOCOBALAMIN 1000 MCG/ML IJ SOLN
1000.0000 ug | Freq: Once | INTRAMUSCULAR | Status: AC
Start: 2023-04-27 — End: 2023-04-27
  Administered 2023-04-27: 1000 ug via INTRAMUSCULAR

## 2023-04-27 NOTE — Progress Notes (Signed)
Patient seen today for weekly B12 injection.  Patient tolerated well.

## 2023-04-29 ENCOUNTER — Ambulatory Visit: Payer: Self-pay | Admitting: Licensed Clinical Social Worker

## 2023-04-29 NOTE — Patient Instructions (Signed)
Visit Information  Thank you for taking time to visit with me today. Please don't hesitate to contact me if I can be of assistance to you.   Following are the goals we discussed today:   Goals Addressed             This Visit's Progress    Reduce Stress and locate a therapist       Activities and task to complete in order to accomplish goals.   Glad yo were able to keep your appointment with Social Security to apply for disability for [employer] [place] [employer] all upcoming appointment discussed today We will discuss more about coping skill and other options during our next call  Glad you were able to talk to [facility] about your co-pay for therapy  Continue to work on your advance directive  I will assist you with finding a provider        Our next appointment is by telephone on 05/07/23 at 10:30  Please call the care guide team at [phone] if you need to cancel or reschedule your appointment.     The patient verbalized understanding of instructions, educational materials, and care plan provided today and DECLINED offer to receive copy of patient instructions, educational materials, and care plan.   Sammuel Hines, LCSW Social Work Care Coordination  [name] [phone]

## 2023-04-29 NOTE — Patient Outreach (Signed)
  Care Coordination  Follow Up Visit Note   04/29/2023 Name: ILLANA NOLTING MRN: [id number] DOB: [birth year]  Stacey Duncan is a 89 y.o. year old female who sees Smitty Cords, DO for primary care. I spoke with  [name] by phone today.  What matters to the patients health and wellness today?  Finding a mental health provider that takes her insurance   Patient has contacted her insurance provided, however continues to experience barriers with finding a provider.  She has confirmed that her co-pay is $45.00 for face to face and 0 co-pay if over the phone.  She does not have a smart phone which will allow her virtual therapy as an option.  Due to cost of face to face co-pay she is unable to afford multi appointments per month.   Goals Addressed             This Visit's Progress    Reduce Stress and locate a therapist       Activities and task to complete in order to accomplish goals.   Glad yo were able to keep your appointment with Social Security to apply for disability for [name] all upcoming appointment discussed today We will discuss more about coping skill and other options during our next call  Glad you were able to talk to [facility] about your co-pay for therapy  Continue to work on your advance directive  I will assist you with finding a provider       SDOH assessments and interventions completed:  No   Care Coordination Interventions:  Yes, provided  Interventions Today    Flowsheet Row Most Recent Value  Chronic Disease   Chronic disease during today's visit Hypertension (HTN)  General Interventions   General Interventions Discussed/Reviewed General Interventions Reviewed  [having difficulty locating a provider that will take her insurance]  Mental Health Interventions   Mental Health Discussed/Reviewed Mental Health Reviewed  [solution focused, task centered, problems solving]       Follow up plan: Follow up call scheduled for  05/07/23    Encounter Outcome:  Pt. Visit Completed   Sammuel Hines, LCSW Social Work Care Coordination  [facility] [name] [phone]

## 2023-05-03 ENCOUNTER — Encounter: Payer: Self-pay | Admitting: Licensed Clinical Social Worker

## 2023-05-03 NOTE — Patient Outreach (Signed)
  Care Coordination   Collaboration  Note   05/03/2023 Name: Stacey Duncan MRN: [id number] DOB: Nov 08, 1931  Stacey Duncan is a 89 y.o. year old female who sees Smitty Cords, DO for primary care. I  did not contact or engage with patient during this encounter  What matters to the patients health and wellness today?  Finding an in-network therapist  LCSW has spent time trying to locate an in-network therapist for patient.  Called all providers in [place].  Unable to locate an in-network provider that is taking new patients and able to meet patient's needs.   Still waiting on return call from last provider.  SDOH assessments and interventions completed:  No   Care Coordination Interventions:  Yes, provided  Interventions Today    Flowsheet Row Most Recent Value  Chronic Disease   Chronic disease during today's visit Hypertension (HTN)  General Interventions   General Interventions Discussed/Reviewed Communication with  [barriers to care]  Communication with --  [community therapist to locate a provider for patient]       Follow up plan: Follow up call scheduled for 05/07/23    Encounter Outcome:  Pt. Visit Completed   Sammuel Hines, LCSW Social Work Care Coordination  [name] [phone]

## 2023-05-03 NOTE — Patient Instructions (Signed)
  Patient was not contacted during this encounter.  LCSW collaborated with care team to accomplish patient's care plan goal   Aletta Edmunds, LCSW Social Work Care Coordination  [phone]  

## 2023-05-04 ENCOUNTER — Encounter: Payer: Self-pay | Admitting: Internal Medicine

## 2023-05-04 ENCOUNTER — Ambulatory Visit (INDEPENDENT_AMBULATORY_CARE_PROVIDER_SITE_OTHER): Payer: Medicare PPO

## 2023-05-04 ENCOUNTER — Ambulatory Visit (INDEPENDENT_AMBULATORY_CARE_PROVIDER_SITE_OTHER): Payer: Medicare PPO | Admitting: Internal Medicine

## 2023-05-04 VITALS — BP 122/60 | HR 60 | Temp 95.9°F | Wt 140.0 lb

## 2023-05-04 DIAGNOSIS — E538 Deficiency of other specified B group vitamins: Secondary | ICD-10-CM

## 2023-05-04 DIAGNOSIS — L821 Other seborrheic keratosis: Secondary | ICD-10-CM

## 2023-05-04 MED ORDER — CYANOCOBALAMIN 1000 MCG/ML IJ SOLN
1000.0000 ug | INTRAMUSCULAR | 0 refills | Status: DC
Start: 2023-05-04 — End: 2023-06-23

## 2023-05-04 MED ORDER — CYANOCOBALAMIN 1000 MCG/ML IJ SOLN
1000.0000 ug | Freq: Once | INTRAMUSCULAR | Status: AC
Start: 2023-05-04 — End: 2023-05-04
  Administered 2023-05-04: 1000 ug via INTRAMUSCULAR

## 2023-05-04 NOTE — Progress Notes (Signed)
Subjective:    Patient ID: Stacey Duncan, female    DOB: 08/02/1931, 89 y.o.   MRN: [id number]  HPI  Patient presents to clinic today with complaint of skin lesions underneath her right breast.  She reports they have been there for some time but 1 seems to be irritated.  She has not tried anything OTC for this.  Mammogram from 06/2018 reviewed.  Review of Systems  Past Medical History:  Diagnosis Date   Anxiety    Fibromyalgia    GERD (gastroesophageal reflux disease)    History of echocardiogram    a. 02/2016 Echo: EF 60-65%, no rwma. Nl RV fxn and PASP.   HTN (hypertension)    Hypothyroidism    Mixed hyperlipidemia    Osteoarthritis    a. bilateral knees   PAF (paroxysmal atrial fibrillation) (HCC)    a. 02/2015 & 09/2017 - admit w/ Afib, briefly req Amio; b. CHA2DSVASc = 4-->Eliquis.   Recurrent UTI (urinary tract infection)    Sinus bradycardia    a. Has limited beta blocker dosing.    Current Outpatient Medications  Medication Sig Dispense Refill   apixaban (ELIQUIS) 5 MG TABS tablet Take 1 tablet (5 mg total) by mouth 2 (two) times daily. 180 tablet 1   atorvastatin (LIPITOR) 80 MG tablet TAKE 1 TABLET BY MOUTH ONCE DAILY AT  6PM 90 tablet 3   Cholecalciferol 25 MCG (1000 UT) tablet Take 1,000 Units by mouth daily.      clobetasol cream (TEMOVATE) 0.05 % Apply 1 application topically 2 (two) times daily as needed (skin irritation). 30 g 0   cyanocobalamin (VITAMIN B12) 1000 MCG/ML injection Inject 1 mL (1,000 mcg total) into the muscle once a week. For 4 weeks 4 mL 0   FLUoxetine (PROZAC) 40 MG capsule Take 1 capsule by mouth once daily 30 capsule 0   furosemide (LASIX) 20 MG tablet Take 1 tablet (20 mg total) by mouth daily. 90 tablet 3   gabapentin (NEURONTIN) 300 MG capsule Take 2 capsules (600 mg total) by mouth at bedtime. 180 capsule 3   levothyroxine (SYNTHROID) 100 MCG tablet Take 1 tablet (100 mcg total) by mouth daily before breakfast. 90 tablet 3    metoprolol tartrate (LOPRESSOR) 25 MG tablet TAKE 1/2 TABLET TWICE A DAY. 90 tablet 2   omeprazole (PRILOSEC) 40 MG capsule Take 1 capsule (40 mg total) by mouth daily as needed. 90 capsule 3   tiZANidine (ZANAFLEX) 2 MG tablet Take 1 tablet (2 mg total) by mouth every 6 (six) hours as needed for muscle spasms. 30 tablet 0   No current facility-administered medications for this visit.    Allergies  Allergen Reactions   Sulfa Antibiotics Other (See Comments)   Sulfacetamide Sodium     Other reaction(s): Other (See Comments)    Family History  Problem Relation Age of Onset   Heart disease Mother 54       CABG   Heart attack Maternal Grandfather    Heart attack Maternal Grandmother     Social History   Socioeconomic History   Marital status: Married    Spouse name: Not on file   Number of children: Not on file   Years of education: Not on file   Highest education level: Bachelor's degree (e.g., BA, AB, BS)  Occupational History   Occupation: retired  Tobacco Use   Smoking status: Never   Smokeless tobacco: Never  Vaping Use   Vaping Use: Never used  Substance  and Sexual Activity   Alcohol use: Yes    Alcohol/week: 7.0 standard drinks of alcohol    Types: 7 Glasses of wine per week    Comment: glass of wine daily   Drug use: No   Sexual activity: Not Currently    Birth control/protection: Post-menopausal  Other Topics Concern   Not on file  Social History Narrative   Not on file   Social Determinants of Health   Financial Resource Strain: Low Risk  (03/23/2023)   Overall Financial Resource Strain (CARDIA)    Difficulty of Paying Living Expenses: Not hard at all  Food Insecurity: No Food Insecurity (03/23/2023)   Hunger Vital Sign    Worried About Running Out of Food in the Last Year: Never true    Ran Out of Food in the Last Year: Never true  Transportation Needs: No Transportation Needs (03/23/2023)   PRAPARE - Administrator, Civil Service (Medical):  No    Lack of Transportation (Non-Medical): No  Physical Activity: Inactive (03/23/2023)   Exercise Vital Sign    Days of Exercise per Week: 0 days    Minutes of Exercise per Session: 0 min  Stress: Stress Concern Present (03/23/2023)   [employer] of Occupational Health - Occupational Stress Questionnaire    Feeling of Stress : To some extent  Social Connections: Moderately Integrated (03/23/2023)   Social Connection and Isolation Panel [NHANES]    Frequency of Communication with Friends and Family: More than three times a week    Frequency of Social Gatherings with Friends and Family: More than three times a week    Attends Religious Services: More than 4 times per year    Active Member of [employer] or Organizations: No    Attends Banker Meetings: Never    Marital Status: Married  Catering manager Violence: Not At Risk (03/23/2023)   Humiliation, Afraid, Rape, and Kick questionnaire    Fear of Current or Ex-Partner: No    Emotionally Abused: No    Physically Abused: No    Sexually Abused: No     Constitutional: Denies fever, malaise, fatigue, headache or abrupt weight changes.  Respiratory: Denies difficulty breathing, shortness of breath, cough or sputum production.   Cardiovascular: Denies chest pain, chest tightness, palpitations or swelling in the hands or feet.  Skin: Patient reports skin lesions underneath right breast.  Denies redness, rashes, or ulcercations.  Neurological: Denies dizziness, difficulty with memory, difficulty with speech or problems with balance and coordination.    No other specific complaints in a complete review of systems (except as listed in HPI above).     Objective:   Physical Exam  BP 122/60 (BP Location: Left Arm, Patient Position: Sitting, Cuff Size: Normal)   Pulse 60   Temp (!) 95.9 F (35.5 C) (Temporal)   Wt 140 lb (63.5 kg)   SpO2 96%   BMI 25.61 kg/m   Wt Readings from Last 3 Encounters:  04/13/23 139 lb (63 kg)   03/17/23 141 lb 12.8 oz (64.3 kg)  03/16/23 142 lb (64.4 kg)    General: Appears her stated age, in NAD. Skin: 3 hyperpigmented scaly lesions noted underneath the right breast resembling seborrheic keratoses. Cardiovascular: Normal rate. Pulmonary/Chest: Normal effort. Neurological: Alert and oriented. Cranial nerves II-XII grossly intact. Coordination normal.    BMET    Component Value Date/Time   NA 140 03/15/2023 0959   NA 140 06/22/2019 1424   NA 141 03/29/2015 0501   K  4.4 03/15/2023 0959   K 3.8 03/29/2015 0501   CL 104 03/15/2023 0959   CL 110 03/29/2015 0501   CO2 29 03/15/2023 0959   CO2 23 03/29/2015 0501   GLUCOSE 93 03/15/2023 0959   GLUCOSE 110 (H) 03/29/2015 0501   BUN 9 03/15/2023 0959   BUN 14 06/22/2019 1424   BUN 10 03/29/2015 0501   CREATININE 0.80 03/15/2023 0959   CALCIUM 9.1 03/15/2023 0959   CALCIUM 8.5 (L) 03/29/2015 0501   GFRNONAA >60 09/14/2022 1554   GFRNONAA 57 (L) 01/08/2021 0852   GFRAA 66 01/08/2021 0852    Lipid Panel     Component Value Date/Time   CHOL 139 03/15/2023 0959   CHOL 267 (H) 06/22/2019 1424   TRIG 139 03/15/2023 0959   HDL 48 (L) 03/15/2023 0959   HDL 50 06/22/2019 1424   CHOLHDL 2.9 03/15/2023 0959   VLDL 28 02/03/2017 0000   LDLCALC 69 03/15/2023 0959    CBC    Component Value Date/Time   WBC 8.3 03/15/2023 0959   RBC 4.25 03/15/2023 0959   HGB 12.9 03/15/2023 0959   HGB 13.3 06/22/2019 1424   HCT 39.1 03/15/2023 0959   HCT 40.4 06/22/2019 1424   PLT 300 03/15/2023 0959   PLT 295 06/22/2019 1424   MCV 92.0 03/15/2023 0959   MCV 89 06/22/2019 1424   MCV 90 03/29/2015 0501   MCH 30.4 03/15/2023 0959   MCHC 33.0 03/15/2023 0959   RDW 13.1 03/15/2023 0959   RDW 14.1 06/22/2019 1424   RDW 13.9 03/29/2015 0501   LYMPHSABS 3,212 03/15/2023 0959   LYMPHSABS 3.0 10/02/2015 1123   LYMPHSABS 3.0 03/29/2015 0501   MONOABS 568 02/03/2017 0000   MONOABS 0.6 03/29/2015 0501   EOSABS 274 03/15/2023 0959    EOSABS 0.2 10/02/2015 1123   EOSABS 0.2 03/29/2015 0501   BASOSABS 66 03/15/2023 0959   BASOSABS 0.0 10/02/2015 1123   BASOSABS 0.1 03/29/2015 0501    Hgb A1C Lab Results  Component Value Date   HGBA1C 5.9 (H) 03/15/2023           Assessment & Plan:    Seborrheic Keratosis of Right Breast:  Advised her that these are benign and not cancerous Irritation is due to contact from the bra Offered referral to dermatology to see about freezing the seborrheic keratosis off however she reports she would prefer to schedule her own appointment with dermatologist in [facility]  Follow-up with your PCP as previously scheduled Nicki Reaper, NP

## 2023-05-04 NOTE — Progress Notes (Signed)
Patient seen today for last weekly B12.  Patient tolerated well.  Monthly appointment scheduled and refills sent.  Will discuss at next appointment if therapy is helping and if monthly injections will continue.

## 2023-05-04 NOTE — Patient Instructions (Signed)
Seborrheic Keratosis A seborrheic keratosis is a common, noncancerous (benign) skin growth. These growths are velvety, waxy, or rough spots that appear on the skin. They are often tan, brown, or black. The skin growths can be flat or raised and may be scaly. What are the causes? The cause of this condition is not known. What increases the risk? You are more likely to develop this condition if you: Have a family history of seborrheic keratosis. Are 50 years old or older. Are pregnant. Have had estrogen replacement therapy. What are the signs or symptoms? Symptoms of this condition include growths on the face, chest, shoulders, back, or other areas. These growths: Are usually painless, but may become irritated and itchy. Can be tan, yellow, brown, black, or other colors. Are slightly raised or have a flat surface. Are sometimes rough or wart-like in texture. Are often velvety or waxy on the surface. Are round or oval-shaped. Often occur in groups, but may occur as a single growth. How is this diagnosed? This condition is diagnosed with a medical history and physical exam. A sample of the growth may be tested (skin biopsy). You may also need to see a skin specialist (dermatologist). How is this treated? Treatment is not usually needed for this condition unless the growths are irritated or bleed often. You may also choose to have the growths removed if you do not like their appearance. Growth removal may include a procedure in which: Liquid nitrogen is applied to "freeze" off the growth (cryosurgery). This is the most common procedure. The growth is burned off with electricity (electrocautery). The growth is removed by scraping (curettage). Follow these instructions at home: Watch your growth or growths for any changes. Do not scratch or pick at the growth or growths. This can cause them to become irritated or infected. Contact a health care provider if: You suddenly have many new  growths. Your growth bleeds, itches, or hurts. Your growth suddenly becomes larger or changes color. Summary A seborrheic keratosis is a common, noncancerous skin growth. Treatment is not usually needed for this condition unless the growths are irritated or bleed often. Watch your growth or growths for any changes. Contact a health care provider if you suddenly have many new growths or your growth suddenly becomes larger or changes color. This information is not intended to replace advice given to you by your health care provider. Make sure you discuss any questions you have with your health care provider. Document Revised: 02/27/2022 Document Reviewed: 02/27/2022 Elsevier Patient Education  2023 Elsevier Inc.  

## 2023-05-07 ENCOUNTER — Ambulatory Visit: Payer: Self-pay | Admitting: Licensed Clinical Social Worker

## 2023-05-07 NOTE — Patient Instructions (Signed)
Visit Information  Thank you for taking time to visit with me today. Please don't hesitate to contact me if I can be of assistance to you.   Following are the goals we discussed today:   Goals Addressed             This Visit's Progress    Reduce Stress and locate a therapist       Activities and task to complete in order to accomplish goals.   I am sorry we are having a difficulty time with locating a therapist that is in-network with your insurance.  I will continue to explore other options Please call [facility] to schedule a therapy appointment   Glad yo were able to keep your appointment with Social Security to apply for disability for [employer] all upcoming appointment discussed today We will discuss more about coping skill and other options during our next call  Continue to work on your advance directive         Our next appointment is by telephone on 05/21/23 at 9:15  Please call the care guide team at [phone] if you need to cancel or reschedule your appointment.    The patient verbalized understanding of instructions, educational materials, and care plan provided today and DECLINED offer to receive copy of patient instructions, educational materials, and care plan.   Sammuel Hines, LCSW Social Work Care Coordination  [name] [phone]

## 2023-05-07 NOTE — Patient Outreach (Signed)
  Care Coordination  Follow Up Visit Note   05/07/2023 Name: Stacey Duncan MRN: [id number] DOB: [birth year]  Stacey Duncan is a 89 y.o. year old female who sees Stacey Cords, DO for primary care. I spoke with  Stacey Duncan by phone today.  What matters to the patients health and wellness today?  Finding a therapist that is in-network with her insurance.    Goals Addressed             This Visit's Progress    Reduce Stress and locate a therapist       Activities and task to complete in order to accomplish goals.   I am sorry we are having a difficulty time with locating a therapist that is in-network with your insurance.  I will continue to explore other options Please call [facility] to schedule a therapy appointment   Glad yo were able to keep your appointment with Social Security to apply for disability for [name] all upcoming appointment discussed today We will discuss more about coping skill and other options during our next call  Continue to work on your advance directive         SDOH assessments and interventions completed:  No   Care Coordination Interventions:  Yes, provided  Interventions Today    Flowsheet Row Most Recent Value  Chronic Disease   Chronic disease during today's visit Hypertension (HTN)  General Interventions   General Interventions Discussed/Reviewed Communication with  Communication with --  [Insurance provider for Mental health options,]  Education Interventions   Education Provided Provided Education  Mental Health Interventions   Mental Health Discussed/Reviewed Mental Health Reviewed, Coping Strategies  [Motivational interviewing, problem solving, solution focused and task centered]       Follow up plan: Follow up call scheduled for 2 weeks    Encounter Outcome:  Pt. Visit Completed   Stacey Hines, LCSW Social Work Care Coordination  [facility] Stacey Duncan Stacey Duncan [phone]

## 2023-05-11 ENCOUNTER — Telehealth: Payer: Medicare PPO

## 2023-05-19 ENCOUNTER — Telehealth: Payer: Medicare PPO

## 2023-05-19 ENCOUNTER — Ambulatory Visit (INDEPENDENT_AMBULATORY_CARE_PROVIDER_SITE_OTHER): Payer: Medicare PPO

## 2023-05-19 DIAGNOSIS — F439 Reaction to severe stress, unspecified: Secondary | ICD-10-CM

## 2023-05-19 DIAGNOSIS — I480 Paroxysmal atrial fibrillation: Secondary | ICD-10-CM

## 2023-05-19 DIAGNOSIS — F339 Major depressive disorder, recurrent, unspecified: Secondary | ICD-10-CM

## 2023-05-19 DIAGNOSIS — F411 Generalized anxiety disorder: Secondary | ICD-10-CM

## 2023-05-19 DIAGNOSIS — I10 Essential (primary) hypertension: Secondary | ICD-10-CM

## 2023-05-19 NOTE — Chronic Care Management (AMB) (Signed)
Chronic Care Management   CCM RN Visit Note  05/19/2023 Name: Stacey Duncan MRN: [id number] DOB: [birth year]  Subjective: Stacey Duncan is a 89 y.o. year old female who is a primary care patient of Smitty Cords, DO. The patient was referred to the Chronic Care Management team for assistance with care management needs subsequent to provider initiation of CCM services and plan of care.    Today's Visit:  Engaged with patient by telephone for follow up visit.        Goals Addressed             This Visit's Progress    CCM Expected Outcome:  Monitor, Self-Manage and Reduce Symptoms of Afib       Current Barriers:  Chronic Disease Management support and education needs related to effective management of AFIB BP Readings from Last 3 Encounters:  05/04/23 122/60  04/13/23 (!) 102/58  03/17/23 108/62     Planned Interventions: Provider order and care plan reviewed. Collaborated with PharmD regarding patient care and plan. AFIB is stable. The patients blood pressures are more stable. She was outside working and enjoying the afternoon. She feels she is doing good. She is feeling better since getting the Vitamin B12 injections. She is thankful she is doing better and feeling better.  Counseled on increased risk of stroke due to Afib and benefits of anticoagulation for stroke prevention           Reviewed importance of adherence to anticoagulant exactly as prescribed. The patient is compliant with her medications. Denies any new concerns related to medication administration.  Advised patient to discuss changes in AFIB, questions, or concerns with provider Counseled on bleeding risk associated with AFIB and importance of self-monitoring for signs/symptoms of bleeding Counseled on avoidance of NSAIDs due to increased bleeding risk with anticoagulants Counseled on importance of regular laboratory monitoring as prescribed. Has labs on a regular basis.  Counseled on seeking  medical attention after a head injury or if there is blood in the urine/stool Afib action plan reviewed Screening for signs and symptoms of depression related to chronic disease state Assessed social determinant of health barriers  Symptom Management: Take medications as prescribed   Attend all scheduled provider appointments Call provider office for new concerns or questions  call the Suicide and Crisis Lifeline: 988 call the [place] National Suicide Prevention Lifeline: (901)382-3920 or TTY: (743)378-6200 TTY [phone]) to talk to a trained counselor call 1-800-273-TALK (toll free, 24 hour hotline) if experiencing a Mental Health or Behavioral Health Crisis  - make a plan to eat healthy - keep all lab appointments - take medicine as prescribed  Follow Up Plan: Telephone follow up appointment with care management team member scheduled for: 07-14-2023 at 100 pm       CCM Expected Outcome:  Monitor, Self-Manage and Reduce Symptoms of: depression, anxiety, stress       Current Barriers:  Knowledge Deficits related to resources to help with questions she has and help for her adult son who lives with her and her husband that is 27 and is bipolar  Care Coordination needs related to social work support and resources to help with managing her depression, anxiety, and stress and get help with needs and concerns related to her adult son in a patient with depression, anxiety, and stress Chronic Disease Management support and education needs related to effective management of depression, anxiety, and stress  Planned Interventions: Evaluation of current treatment plan related to depression, anxiety, and  stress and patient's adherence to plan as established by provider. The patient has good days and bad days with her depression, anxiety, and stress levels. She has decided not to go to [facility] because her son got upset about it when she talked to him about it. She is going to wait until the new place opens  up in [facility]. Continues to work with the [employer] for ongoing support and education for effective management of her mental health and well being. Reflective listening and support given. She just wants to make sure she is doing what she can to have balance in her life and deal effectively with changes in  her life.  Advised patient to call the office for changes in her mood, anxiety, depression, stress level, or other mental health needs. Provided education to patient re: mindfulness, journal writing, and making sure she practices self care and getting what she needs to manage her mental health and well being. The patient is concerned about her adult son and what may happen to him after her and her husband are gone. She states that he is 68 and has never held down a steady job. He has been married in the past for about 5 years but that did not last. He does not have children. Stacey Duncan lives with her and her husband. He is a very social person and he takes them to their appointments but he would not be able to care for himself financially. She has been to DSS and filled out paperwork, she is also working regularly with the LCSW. Education and support given. No new developments at this time with paperwork. She did find out that her son will not be able to draw social security off of her husband or her. She sees pcp next month and will talk to the pcp about any new concerns she may have. Will continue to monitor for changes or needs.  Reviewed medications with patient and discussed compliance. The patient is compliant with medications. She is still waiting for her trazodone to come as it is through mail order.  Collaborated with pcp and office CMA regarding Vitamin B12 injections. Is getting Vit B12 injections without difficulty. Feels these are helping her a lot.  Provided patient with bipolar  educational materials related to the patient wanting information on bipolar to help her understand her sons condition  as well as possible Reviewed scheduled/upcoming provider appointments including: 06-18-2023 at 240 pm with pcp Social Work referral for assistance for resources for her adult son who is bipolar 1 and also for effective management of her mental health and well being. Ongoing support from the LCSW for support and education Discussed plans with patient for ongoing care management follow up and provided patient with direct contact information for care management team Advised patient to discuss changes in her mood, anxiety, stress, depression or mental health with provider Screening for signs and symptoms of depression related to chronic disease state  Assessed social determinant of health barriers  Symptom Management: Take medications as prescribed   Attend all scheduled provider appointments Call provider office for new concerns or questions  call the Suicide and Crisis Lifeline: 988 call the [place] National Suicide Prevention Lifeline: 937 421 5477 or TTY: (416) 482-0757 TTY [phone]) to talk to a trained counselor call 1-800-273-TALK (toll free, 24 hour hotline) if experiencing a Mental Health or Behavioral Health Crisis   Follow Up Plan: Telephone follow up appointment with care management team member scheduled for:  07-14-2023 at 1 pm  CCM Expected Outcome:  Monitor, Self-Manage, and Reduce Symptoms of Hypertension       Current Barriers:  Care Coordination needs related to changes in her blood pressures and sx and sx that she is having that may be related to blood pressures or other chronic conditions  in a patient with HTN and other heart conditions Chronic Disease Management support and education needs related to effective management of HTN  BP Readings from Last 3 Encounters:  05/04/23 122/60  04/13/23 (!) 102/58  03/17/23 108/62     Planned Interventions: Evaluation of current treatment plan related to hypertension self management and patient's adherence to plan as  established by provider. The patient saw her cardiologist last week and one of her medications was put on hold.  The patient is having a good day today. Blood pressures are better and she is not having any issues with it dropping like it was. Knows to change position slowly and report any new concerns, questions, or issues.  Provided education to patient re: stroke prevention, s/s of heart attack and stroke; Reviewed prescribed diet heart healthy diet Reviewed medications with patient and discussed importance of compliance. The patient is complaint with medications. Denies any new concerns related to medications. ;  Discussed plans with patient for ongoing care management follow up and provided patient with direct contact information for care management team; Advised patient, providing education and rationale, to monitor blood pressure daily and record, calling PCP for findings outside established parameters;  Reviewed scheduled/upcoming provider appointments including:  06-18-2023 at 240 pm Advised patient to discuss changes in her blood pressures, questions, or concerns  with provider; Provided education on prescribed diet heart healthy diet. Is losing weight and discussed monitoring dietary habits, making sure she is staying hydrated;  Discussed complications of poorly controlled blood pressure such as heart disease, stroke, circulatory complications, vision complications, kidney impairment, sexual dysfunction;  Screening for signs and symptoms of depression related to chronic disease state;  Assessed social determinant of health barriers;   Symptom Management: Take medications as prescribed   Attend all scheduled provider appointments Call provider office for new concerns or questions  call the Suicide and Crisis Lifeline: 988 call the [place] National Suicide Prevention Lifeline: 409-657-6982 or TTY: 302-309-0641 TTY 469-737-3192) to talk to a trained counselor call 1-800-273-TALK (toll free,  24 hour hotline) if experiencing a Mental Health or Behavioral Health Crisis  learn about high blood pressure take blood pressure log to all doctor appointments call doctor for signs and symptoms of high blood pressure develop an action plan for high blood pressure keep all doctor appointments take medications for blood pressure exactly as prescribed report new symptoms to your doctor  Follow Up Plan: Telephone follow up appointment with care management team member scheduled for: 07-14-2023 at 100pm          Plan:Telephone follow up appointment with care management team member scheduled for:  07-14-2023 at 1 pm  Alto Denver RN, MSN, CCM RN Care Manager  Chronic Care Management Direct Number: [phone]

## 2023-05-19 NOTE — Patient Instructions (Signed)
Please call the care guide team at [phone] if you need to cancel or reschedule your appointment.   If you are experiencing a Mental Health or Behavioral Health Crisis or need someone to talk to, please call the Suicide and Crisis Lifeline: 988 call the [place] National Suicide Prevention Lifeline: 9388409452 or TTY: 3102397792 TTY (443)499-1045) to talk to a trained counselor call 1-800-273-TALK (toll free, 24 hour hotline)   Following is a copy of the CCM Program Consent:  CCM service includes personalized support from designated clinical staff supervised by the physician, including individualized plan of care and coordination with other care providers 24/7 contact phone numbers for assistance for urgent and routine care needs. Service will only be billed when office clinical staff spend 20 minutes or more in a month to coordinate care. Only one practitioner may furnish and bill the service in a calendar month. The patient may stop CCM services at amy time (effective at the end of the month) by phone call to the office staff. The patient will be responsible for cost sharing (co-pay) or up to 20% of the service fee (after annual deductible is met)  Following is a copy of your full provider care plan:   Goals Addressed             This Visit's Progress    CCM Expected Outcome:  Monitor, Self-Manage and Reduce Symptoms of Afib       Current Barriers:  Chronic Disease Management support and education needs related to effective management of AFIB BP Readings from Last 3 Encounters:  05/04/23 122/60  04/13/23 (!) 102/58  03/17/23 108/62     Planned Interventions: Provider order and care plan reviewed. Collaborated with PharmD regarding patient care and plan. AFIB is stable. The patients blood pressures are more stable. She was outside working and enjoying the afternoon. She feels she is doing good. She is feeling better since getting the Vitamin B12 injections. She is thankful she  is doing better and feeling better.  Counseled on increased risk of stroke due to Afib and benefits of anticoagulation for stroke prevention           Reviewed importance of adherence to anticoagulant exactly as prescribed. The patient is compliant with her medications. Denies any new concerns related to medication administration.  Advised patient to discuss changes in AFIB, questions, or concerns with provider Counseled on bleeding risk associated with AFIB and importance of self-monitoring for signs/symptoms of bleeding Counseled on avoidance of NSAIDs due to increased bleeding risk with anticoagulants Counseled on importance of regular laboratory monitoring as prescribed. Has labs on a regular basis.  Counseled on seeking medical attention after a head injury or if there is blood in the urine/stool Afib action plan reviewed Screening for signs and symptoms of depression related to chronic disease state Assessed social determinant of health barriers  Symptom Management: Take medications as prescribed   Attend all scheduled provider appointments Call provider office for new concerns or questions  call the Suicide and Crisis Lifeline: 988 call the [place] National Suicide Prevention Lifeline: 787-447-4470 or TTY: 819-472-5511 TTY [phone]) to talk to a trained counselor call 1-800-273-TALK (toll free, 24 hour hotline) if experiencing a Mental Health or Behavioral Health Crisis  - make a plan to eat healthy - keep all lab appointments - take medicine as prescribed  Follow Up Plan: Telephone follow up appointment with care management team member scheduled for: 07-14-2023 at 100 pm       CCM Expected Outcome:  Monitor, Self-Manage and Reduce Symptoms of: depression, anxiety, stress       Current Barriers:  Knowledge Deficits related to resources to help with questions she has and help for her adult son who lives with her and her husband that is 69 and is bipolar  Care Coordination needs  related to social work support and resources to help with managing her depression, anxiety, and stress and get help with needs and concerns related to her adult son in a patient with depression, anxiety, and stress Chronic Disease Management support and education needs related to effective management of depression, anxiety, and stress  Planned Interventions: Evaluation of current treatment plan related to depression, anxiety, and stress and patient's adherence to plan as established by provider. The patient has good days and bad days with her depression, anxiety, and stress levels. She has decided not to go to [facility] because her son got upset about it when she talked to him about it. She is going to wait until the new place opens up in [facility]. Continues to work with the [employer] for ongoing support and education for effective management of her mental health and well being. Reflective listening and support given. She just wants to make sure she is doing what she can to have balance in her life and deal effectively with changes in  her life.  Advised patient to call the office for changes in her mood, anxiety, depression, stress level, or other mental health needs. Provided education to patient re: mindfulness, journal writing, and making sure she practices self care and getting what she needs to manage her mental health and well being. The patient is concerned about her adult son and what may happen to him after her and her husband are gone. She states that he is 40 and has never held down a steady job. He has been married in the past for about 5 years but that did not last. He does not have children. [name] lives with her and her husband. He is a very social person and he takes them to their appointments but he would not be able to care for himself financially. She has been to DSS and filled out paperwork, she is also working regularly with the LCSW. Education and support given. No new developments at this  time with paperwork. She did find out that her son will not be able to draw social security off of her husband or her. She sees pcp next month and will talk to the pcp about any new concerns she may have. Will continue to monitor for changes or needs.  Reviewed medications with patient and discussed compliance. The patient is compliant with medications. She is still waiting for her trazodone to come as it is through mail order.  Collaborated with pcp and office CMA regarding Vitamin B12 injections. Is getting Vit B12 injections without difficulty. Feels these are helping her a lot.  Provided patient with bipolar  educational materials related to the patient wanting information on bipolar to help her understand her sons condition as well as possible Reviewed scheduled/upcoming provider appointments including: 06-18-2023 at 240 pm with pcp Social Work referral for assistance for resources for her adult son who is bipolar 1 and also for effective management of her mental health and well being. Ongoing support from the LCSW for support and education Discussed plans with patient for ongoing care management follow up and provided patient with direct contact information for care management team Advised patient to discuss changes in  her mood, anxiety, stress, depression or mental health with provider Screening for signs and symptoms of depression related to chronic disease state  Assessed social determinant of health barriers  Symptom Management: Take medications as prescribed   Attend all scheduled provider appointments Call provider office for new concerns or questions  call the Suicide and Crisis Lifeline: 988 call the [place] National Suicide Prevention Lifeline: 804 513 0509 or TTY: 580-079-8856 TTY 7543412517) to talk to a trained counselor call 1-800-273-TALK (toll free, 24 hour hotline) if experiencing a Mental Health or Behavioral Health Crisis   Follow Up Plan: Telephone follow up appointment  with care management team member scheduled for:  07-14-2023 at 1 pm       CCM Expected Outcome:  Monitor, Self-Manage, and Reduce Symptoms of Hypertension       Current Barriers:  Care Coordination needs related to changes in her blood pressures and sx and sx that she is having that may be related to blood pressures or other chronic conditions  in a patient with HTN and other heart conditions Chronic Disease Management support and education needs related to effective management of HTN  BP Readings from Last 3 Encounters:  05/04/23 122/60  04/13/23 (!) 102/58  03/17/23 108/62     Planned Interventions: Evaluation of current treatment plan related to hypertension self management and patient's adherence to plan as established by provider. The patient saw her cardiologist last week and one of her medications was put on hold.  The patient is having a good day today. Blood pressures are better and she is not having any issues with it dropping like it was. Knows to change position slowly and report any new concerns, questions, or issues.  Provided education to patient re: stroke prevention, s/s of heart attack and stroke; Reviewed prescribed diet heart healthy diet Reviewed medications with patient and discussed importance of compliance. The patient is complaint with medications. Denies any new concerns related to medications. ;  Discussed plans with patient for ongoing care management follow up and provided patient with direct contact information for care management team; Advised patient, providing education and rationale, to monitor blood pressure daily and record, calling PCP for findings outside established parameters;  Reviewed scheduled/upcoming provider appointments including:  06-18-2023 at 240 pm Advised patient to discuss changes in her blood pressures, questions, or concerns  with provider; Provided education on prescribed diet heart healthy diet. Is losing weight and discussed monitoring  dietary habits, making sure she is staying hydrated;  Discussed complications of poorly controlled blood pressure such as heart disease, stroke, circulatory complications, vision complications, kidney impairment, sexual dysfunction;  Screening for signs and symptoms of depression related to chronic disease state;  Assessed social determinant of health barriers;   Symptom Management: Take medications as prescribed   Attend all scheduled provider appointments Call provider office for new concerns or questions  call the Suicide and Crisis Lifeline: 988 call the [place] National Suicide Prevention Lifeline: (304)363-1816 or TTY: 9135512500 TTY 815-555-3982) to talk to a trained counselor call 1-800-273-TALK (toll free, 24 hour hotline) if experiencing a Mental Health or Behavioral Health Crisis  learn about high blood pressure take blood pressure log to all doctor appointments call doctor for signs and symptoms of high blood pressure develop an action plan for high blood pressure keep all doctor appointments take medications for blood pressure exactly as prescribed report new symptoms to your doctor  Follow Up Plan: Telephone follow up appointment with care management team member scheduled for: 07-14-2023 at 100pm  The patient verbalized understanding of instructions, educational materials, and care plan provided today and DECLINED offer to receive copy of patient instructions, educational materials, and care plan.  Telephone follow up appointment with care management team member scheduled for: 07-14-2023 at 1 pm

## 2023-05-21 ENCOUNTER — Ambulatory Visit: Payer: Self-pay | Admitting: Licensed Clinical Social Worker

## 2023-05-21 NOTE — Patient Outreach (Signed)
  Care Coordination  Follow Up Visit Note   05/21/2023 Name: Stacey Duncan MRN: [id number] DOB: 1931-05-07  Stacey Duncan is a 89 y.o. year old female who sees Stacey Cords, DO for primary care. I spoke with  Stacey Duncan by phone today.  What matters to the patients health and wellness today?  Managing her physical and mental health. Find a therapist in-network with insurance    Goals Addressed             This Visit's Progress    Reduce Stress and locate a therapist       Activities and task to complete in order to accomplish goals.   I am sorry we are having a difficulty time with locating a therapist that is in-network with your insurance.  I will continue to explore other options. Waiting on update from [phone]  [facility] for an update as to when they will start taking [facility] Medicare Keep all upcoming appointment discussed today We will continue to discuss more about coping skills until connect you are connected for therapy  Continue work on establishing your women's church group Continue to work on your advance directive         SDOH assessments and interventions completed:  No   Care Coordination Interventions:  Yes, provided  Interventions Today    Flowsheet Row Most Recent Value  Chronic Disease   Chronic disease during today's visit Hypertension (HTN)  General Interventions   General Interventions Discussed/Reviewed Communication with  Communication with --  [phone]  [facility]]  Mental Health Interventions   Mental Health Discussed/Reviewed Mental Health Reviewed, Depression  [active listening, behavioral activation, emotional support]  Safety Interventions   Safety Discussed/Reviewed Safety Reviewed  Home Safety Assistive Devices  [uses cane]  Advanced Directive Interventions   Advanced Directives Discussed/Reviewed Advanced Directives Reviewed  [has completed]       Follow up plan: Follow up call  scheduled for 30 days Patient will call if needed prior to appointment.  LCSW will continue to look for a therapist.    Encounter Outcome:  Pt. Visit Completed   Stacey Hines, LCSW Social Work Care Coordination  [facility] Stacey Duncan Stacey Duncan [phone]

## 2023-05-21 NOTE — Patient Instructions (Signed)
Social Work Visit Information  Thank you for taking time to visit with me today. Please don't hesitate to contact me if I can be of assistance to you.   Following are the goals we discussed today:   Goals Addressed             This Visit's Progress    Reduce Stress and locate a therapist       Activities and task to complete in order to accomplish goals.   I am sorry we are having a difficulty time with locating a therapist that is in-network with your insurance.  I will continue to explore other options. Waiting on update from [phone]  [facility] for an update as to when they will start taking [facility] Keep all upcoming appointment discussed today We will continue to discuss more about coping skills until connect you are connected for therapy  Continue work on establishing your women's church group Continue to work on your advance directive to get it notarized         Our next appointment is by telephone on 06/22/23 at 9:15   Please call the care guide team at [phone] if you need to cancel or reschedule your appointment.   If you or anyone you know are experiencing a Mental Health or Behavioral Health Crisis or need someone to talk to, please call the Suicide and Crisis Lifeline: 988 call the [place] National Suicide Prevention Lifeline: 850-545-5909 or TTY: 919-874-7790 TTY 510-280-9286) to talk to a trained counselor call 1-800-273-TALK (toll free, 24 hour hotline) go to [facility] [address], [place] [phone])   The patient verbalized understanding of instructions, educational materials, and care plan provided today and DECLINED offer to receive copy of patient instructions, educational materials, and care plan.     Sammuel Hines, LCSW Social Work Care Coordination  [name] [phone]

## 2023-05-28 DIAGNOSIS — I4891 Unspecified atrial fibrillation: Secondary | ICD-10-CM

## 2023-05-28 DIAGNOSIS — F32A Depression, unspecified: Secondary | ICD-10-CM | POA: Diagnosis not present

## 2023-05-28 DIAGNOSIS — I10 Essential (primary) hypertension: Secondary | ICD-10-CM | POA: Diagnosis not present

## 2023-06-04 ENCOUNTER — Ambulatory Visit (INDEPENDENT_AMBULATORY_CARE_PROVIDER_SITE_OTHER): Payer: Medicare PPO

## 2023-06-04 DIAGNOSIS — E538 Deficiency of other specified B group vitamins: Secondary | ICD-10-CM

## 2023-06-04 MED ORDER — CYANOCOBALAMIN 1000 MCG/ML IJ SOLN
1000.0000 ug | Freq: Once | INTRAMUSCULAR | Status: AC
Start: 2023-06-04 — End: 2023-06-04
  Administered 2023-06-04: 1000 ug via INTRAMUSCULAR

## 2023-06-09 ENCOUNTER — Ambulatory Visit: Payer: Medicare PPO | Admitting: Licensed Clinical Social Worker

## 2023-06-09 NOTE — Patient Instructions (Signed)
Social Work Visit Information  Thank you for taking time to visit with me today. Please don't hesitate to contact me if I can be of assistance to you.   Following are the goals we discussed today:   Goals Addressed             This Visit's Progress    Reduce Stress and locate a therapist       Activities and task to complete in order to accomplish goals.   I am sorry we are having a difficulty time with locating a therapist that is in-network with your insurance.  I will continue to explore other options.  [facility] [phone] will start taking [facility]; I will place a referral for you as soon as they start taking referrals Keep all upcoming appointment discussed today Continue with your relaxed breathing  Continue work on establishing your women's church group Continue to work on your advance directive to get it notarized          Our next appointment is by telephone on 6/25    Please call the care guide team at [phone] if you need to cancel or reschedule your appointment.    The patient verbalized understanding of instructions, educational materials, and care plan provided today and DECLINED offer to receive copy of patient instructions, educational materials, and care plan.   Sammuel Hines, LCSW Social Work Care Coordination  [name] [phone]

## 2023-06-09 NOTE — Patient Outreach (Signed)
  Care Coordination  Follow Up Visit Note   06/09/2023 Name: Stacey Duncan MRN: [id number] DOB: Feb 11, 1931  Stacey Duncan is a 89 y.o. year old female who sees Smitty Cords, DO for primary care. I spoke with  [name] by phone today.  What matters to the patients health and wellness today?  Finding a therapist to manager her stress  Patient called LCSW today with difficult managing stress at home.  Provided interventions to calm patient during this encounter. LCSW continues to experience barriers with finding a therapist in network with patient's insurance and taking new patients. will continue to assist with getting her connected for ongoing therapy.   Goals Addressed             This Visit's Progress    Reduce Stress and locate a therapist       Activities and task to complete in order to accomplish goals.   I am sorry we are having a difficulty time with locating a therapist that is in-network with your insurance.  I will continue to explore other options.  [facility] [phone] will start taking [employer]; I have placed a referral for you Keep all upcoming appointment discussed today Continue with your relaxed breathing  Continue work on establishing your women's church group Continue to work on your advance directive to get it notarized         SDOH assessments and interventions completed:  No   Care Coordination Interventions:  Yes, provided  Interventions Today    Flowsheet Row Most Recent Value  Chronic Disease   Chronic disease during today's visit Hypertension (HTN)  General Interventions   General Interventions Discussed/Reviewed Communication with, General Interventions Reviewed  Communication with --  [mental health providers to find a provider that is in network for patient]  Education Interventions   Provided Verbal Education On Mental Health/Coping with Illness  Mental Health Interventions   Mental Health  Discussed/Reviewed Mental Health Reviewed, Anxiety, Coping Strategies  [active listening,  relaxed breathing,  solution focused , problem solving]       Follow up plan: Follow up call scheduled for 2 week    Encounter Outcome:  Pt. Visit Completed   Sammuel Hines, LCSW Social Work Care Coordination  [name] [phone]

## 2023-06-14 ENCOUNTER — Other Ambulatory Visit: Payer: Self-pay | Admitting: Cardiovascular Disease

## 2023-06-14 NOTE — Telephone Encounter (Signed)
Prescription refill request for Eliquis received. Indication:afib Last office visit:3/24 Scr:0.8  3/24 Age: 89 Weight:63.5  kg  Prescription refilled

## 2023-06-14 NOTE — Telephone Encounter (Signed)
Refill request

## 2023-06-18 ENCOUNTER — Ambulatory Visit: Payer: Medicare PPO | Admitting: Family Medicine

## 2023-06-22 ENCOUNTER — Ambulatory Visit: Payer: Self-pay | Admitting: Licensed Clinical Social Worker

## 2023-06-22 NOTE — Patient Instructions (Signed)
Social Work Visit Information  Thank you for taking time to visit with me today. Please don't hesitate to contact me if I can be of assistance to you.   Following are the goals we discussed today:   Goals Addressed             This Visit's Progress    Reduce Stress and locate a therapist       Activities and task to complete in order to accomplish goals.   I am sorry we are having a difficulty time with locating a therapist that is in-network with your insurance.  I will continue to explore other options.  Per your request I have contacted providers to explore private pay options. We will discuss this during our next call Keep all upcoming appointment discussed today Continue with your relaxed breathing  Continue to work on your advance directive to get it notarized          Our next appointment is by telephone on 06/24/23 at 11;00   Please call the care guide team at [phone] if you need to cancel or reschedule your appointment.    The patient verbalized understanding of instructions, educational materials, and care plan provided today and DECLINED offer to receive copy of patient instructions, educational materials, and care plan.     Sammuel Hines, LCSW Social Work Care Coordination  [name] [phone]

## 2023-06-22 NOTE — Patient Outreach (Signed)
  Care Coordination  Follow Up Visit Note   06/22/2023 Name: Stacey Duncan MRN: [id number] DOB: May 27, 1931  Stacey Duncan is a 89 y.o. year old female who sees Smitty Cords, DO for primary care. I spoke with  [name] by phone today.  What matters to the patients health and wellness today?  Continues to have difficulty with managing her mental health needs.    Goals Addressed             This Visit's Progress    Reduce Stress and locate a therapist       Activities and task to complete in order to accomplish goals.   I am sorry we are having a difficulty time with locating a therapist that is in-network with your insurance.  I will continue to explore other options.  Per your request I have contacted providers to explore private pay options. We will discuss this during our next call Keep all upcoming appointment discussed today Continue with your relaxed breathing  Continue to work on your advance directive to get it notarized         SDOH assessments and interventions completed:  No   Care Coordination Interventions:  Yes, provided  Interventions Today    Flowsheet Row Most Recent Value  Chronic Disease   Chronic disease during today's visit Hypertension (HTN)  General Interventions   General Interventions Discussed/Reviewed Communication with, Doctor Visits  Doctor Visits Discussed/Reviewed Doctor Visits Reviewed  [reminder of up coming appointments and how to prepare]  Communication with --  [locating therapist in-network with insurance]  Education Interventions   Education Provided Provided Education  [discussed private pay options for therapy , psycho-eduation provide]  Provided Verbal Education On Mental Health/Coping with Illness  [reviewd only options in [place] that takes insurance ( [facility]) patient still not wanting to go there]  Mental Health Interventions   Mental Health Discussed/Reviewed Mental Health Reviewed, Coping Strategies,  Anxiety, Depression  [solution focused, emptional support, active listening, ]       Follow up plan: Follow up call scheduled for 06/24/23    Encounter Outcome:  Pt. Visit Completed   Sammuel Hines, LCSW Social Work Care Coordination  [facility] [name] [phone]

## 2023-06-23 ENCOUNTER — Encounter: Payer: Self-pay | Admitting: Family Medicine

## 2023-06-23 ENCOUNTER — Ambulatory Visit (INDEPENDENT_AMBULATORY_CARE_PROVIDER_SITE_OTHER): Payer: Medicare PPO | Admitting: Family Medicine

## 2023-06-23 VITALS — BP 118/70 | HR 62 | Temp 98.2°F | Ht 62.0 in | Wt 141.2 lb

## 2023-06-23 DIAGNOSIS — F411 Generalized anxiety disorder: Secondary | ICD-10-CM

## 2023-06-23 DIAGNOSIS — E032 Hypothyroidism due to medicaments and other exogenous substances: Secondary | ICD-10-CM

## 2023-06-23 DIAGNOSIS — E538 Deficiency of other specified B group vitamins: Secondary | ICD-10-CM

## 2023-06-23 DIAGNOSIS — F339 Major depressive disorder, recurrent, unspecified: Secondary | ICD-10-CM

## 2023-06-23 DIAGNOSIS — F5104 Psychophysiologic insomnia: Secondary | ICD-10-CM

## 2023-06-23 DIAGNOSIS — M797 Fibromyalgia: Secondary | ICD-10-CM | POA: Diagnosis not present

## 2023-06-23 MED ORDER — CYANOCOBALAMIN 1000 MCG/ML IJ SOLN: 1000.0000 ug | INTRAMUSCULAR | 0 refills | Status: DC

## 2023-06-23 MED ORDER — FLUOXETINE HCL 40 MG PO CAPS: 40.0000 mg | ORAL_CAPSULE | Freq: Every day | ORAL | 1 refills | Status: DC

## 2023-06-23 MED ORDER — LEVOTHYROXINE SODIUM 100 MCG PO TABS: 100.0000 ug | ORAL_TABLET | Freq: Every day | ORAL | 3 refills | Status: DC

## 2023-06-23 NOTE — Patient Instructions (Addendum)
Thank you for coming to the office today.  Return for Mon 7/1 morning for Nurse visit B12 injection before your trip  We can repeat the lab after in future  Rx sent to [facility] mail order for Levothyroxine 100 mcg and Fluoxetine 40mg   Okay to take Tizanidine as needed for muscle / leg pain as well. Contact me if you need more.  Recommend Alpha Lipoic Acid supplement for nerves, 300 to 600mg  per dose up to 2 to 3 times a day.   Please schedule a Follow-up Appointment to: Return for Upcoming Vitamin B12 injection 7/1 nurse visit AM .  If you have any other questions or concerns, please feel free to call the office or send a message through MyChart. You may also schedule an earlier appointment if necessary.  Additionally, you may be receiving a survey about your experience at our office within a few days to 1 week by e-mail or mail. We value your feedback.  Saralyn Pilar, DO [facility], [place]

## 2023-06-23 NOTE — Progress Notes (Signed)
Subjective:    Patient ID: Stacey Duncan, female    DOB: 03-14-1931, 89 y.o.   MRN: [id number]  Stacey Duncan is a 89 y.o. female presenting on 06/23/2023 for Insomnia (3 mths f/u mood, insomnia )   HPI  Fatigue / Energy Remains low but seems stable from previous   Lab review 02/2023 CM[facility] normal CBC normal  She has done better on lower thyroid dose, see below. Needs refill   Weight gain now and stability up 2+ lbs in past 3 months Appetite improved   Fibromyalgia / Joint Pain Arthritis Reduced Gabapentin has helped her. Less groggy. Takes evening only now, not daytime Does not make her groggy she reports but unsure.   Dyspnea / PAF Followed by Cardiology Saw them previously for dyspnea > ECHO > ordered Furosemide 20mg  daily and it has helped her breathing.   Pre-Diabetes A1c 5.9, similar to previous results 5.7 to 5.9 over past few years. She has limited sweets in diet with goal to avoid excess carb starches sugars   Hypothyroidism Last result 02/2023. See on chart. Improved on Levothyroxine daily   HYPERTENSION Cardiology discontinued her Hydralazine recently thought may be causing low BP and symptoms Seems this has helped   Vitamin B12 deficiency Result B12 294, goal is >200 but this is low normal. Improved on B12 injections, will be due again in 1st week of July before she travels.   Major Depression, recurrent Insomnia secondary Improved mood now, see prior documentation. Her son is doing better, and she has better mood overall   Insomnia Sleeping better at night overall now. Still tired during day She did not start Trazodone, it was rx last visit, she was cautious about potential risk of side effect and discontinued this med. Did not try it.   Fluoxetine 40mg  daily, in past failed dose 60mg  and also failed coming off of Fluoxetine too quickly. Also failed Wellbutrin side effects   She has frustration with husband with his hearing and  cognitive decline and age Her son Fredrik Cove helps them function at home and cleans She is frustrated with unable to do her normal tasks  Continues on Gabapentin 300mg  x 2 = 600mg  nightly with good results, she has reduced the day time dose. She will take Tizanidine as needed.  Continues on Fluoxetine 40mg  daily  Occasionally if wakes up will sip on very small amount of wine and helps her fall back asleep  B12 early injection 06/28/23      06/23/2023   10:56 AM 04/13/2023   11:21 AM 03/17/2023    3:18 PM  Depression screen PHQ 2/9  Decreased Interest 1 1 0  Down, Depressed, Hopeless 0 0 2  PHQ - 2 Score 1 1 2   Altered sleeping 1 1 2   Tired, decreased energy 2 2 2   Change in appetite 1 1 1   Feeling bad or failure about yourself  0 0 0  Trouble concentrating 0 0 0  Moving slowly or fidgety/restless 0 1 0  Suicidal thoughts 0 0 0  PHQ-9 Score 5 6 7   Difficult doing work/chores Not difficult at all Somewhat difficult Not difficult at all      06/23/2023   10:57 AM 04/13/2023   11:22 AM 06/24/2021    1:32 PM 09/24/2020   11:19 AM  GAD 7 : Generalized Anxiety Score  Nervous, Anxious, on Edge 1 1 1 2   Control/stop worrying 1 1 0 2  Worry too much - different things 1  1 0 2  Trouble relaxing 1 1 1 1   Restless 0 0 0 0  Easily annoyed or irritable 1 1 1 2   Afraid - awful might happen 1 1 1 2   Total GAD 7 Score 6 6 4 11   Anxiety Difficulty Not difficult at all Somewhat difficult Somewhat difficult Somewhat difficult      Social History   Tobacco Use   Smoking status: Never   Smokeless tobacco: Never  Vaping Use   Vaping Use: Never used  Substance Use Topics   Alcohol use: Yes    Alcohol/week: 7.0 standard drinks of alcohol    Types: 7 Glasses of wine per week    Comment: glass of wine daily   Drug use: No    Review of Systems Per HPI unless specifically indicated above     Objective:    BP 118/70 (BP Location: Left Arm, Patient Position: Sitting, Cuff Size: Normal)    Pulse 62   Temp 98.2 F (36.8 C) (Oral)   Ht 5\' 2"  (1.575 m)   Wt 141 lb 3.2 oz (64 kg)   SpO2 95%   BMI 25.83 kg/m   Wt Readings from Last 3 Encounters:  06/23/23 141 lb 3.2 oz (64 kg)  05/04/23 140 lb (63.5 kg)  04/13/23 139 lb (63 kg)    Physical Exam Vitals and nursing note reviewed.  Constitutional:      General: She is not in acute distress.    Appearance: Normal appearance. She is well-developed. She is not diaphoretic.     Comments: Well-appearing, comfortable, cooperative  HENT:     Head: Normocephalic and atraumatic.  Eyes:     General:        Right eye: No discharge.        Left eye: No discharge.     Conjunctiva/sclera: Conjunctivae normal.  Cardiovascular:     Rate and Rhythm: Normal rate.  Pulmonary:     Effort: Pulmonary effort is normal.  Musculoskeletal:     Right lower leg: No edema.     Left lower leg: No edema.  Skin:    General: Skin is warm and dry.     Findings: No erythema or rash.  Neurological:     Mental Status: She is alert and oriented to person, place, and time.     Sensory: Sensory deficit (bilateral stocking distribution with lower extremities reduced sensation to light touch and monofilament) present.  Psychiatric:        Mood and Affect: Mood normal.        Behavior: Behavior normal.        Thought Content: Thought content normal.     Comments: Well groomed, good eye contact, normal speech and thoughts       Results for orders placed or performed in visit on 03/12/23  Vitamin B12  Result Value Ref Range   Vitamin B-12 294 200 - 1,100 pg/mL  T4, free  Result Value Ref Range   Free T4 1.3 0.8 - 1.8 ng/dL  TSH  Result Value Ref Range   TSH 0.32 (L) 0.40 - 4.50 mIU/L  Hemoglobin A1c  Result Value Ref Range   Hgb A1c MFr Bld 5.9 (H) <5.7 % of total Hgb   Mean Plasma Glucose 123 mg/dL   eAG (mmol/L) 6.8 mmol/L  Lipid panel  Result Value Ref Range   Cholesterol 139 <200 mg/dL   HDL 48 (L) > OR = 50 mg/dL   Triglycerides  161 <096 mg/dL  LDL Cholesterol (Calc) 69 mg/dL (calc)   Total CHOL/HDL Ratio 2.9 <5.0 (calc)   Non-HDL Cholesterol (Calc) 91 <161 mg/dL (calc)  CBC with Differential/Platelet  Result Value Ref Range   WBC 8.3 3.8 - 10.8 Thousand/uL   RBC 4.25 3.80 - 5.10 Million/uL   Hemoglobin 12.9 11.7 - 15.5 g/dL   HCT 09.6 04.5 - 40.9 %   MCV 92.0 80.0 - 100.0 fL   MCH 30.4 27.0 - 33.0 pg   MCHC 33.0 32.0 - 36.0 g/dL   RDW 81.1 91.4 - 78.2 %   Platelets 300 140 - 400 Thousand/uL   MPV 10.0 7.5 - 12.5 fL   Neutro Abs 4,208 1,500 - 7,800 cells/uL   Lymphs Abs 3,212 850 - 3,900 cells/uL   Absolute Monocytes 540 200 - 950 cells/uL   Eosinophils Absolute 274 15 - 500 cells/uL   Basophils Absolute 66 0 - 200 cells/uL   Neutrophils Relative % 50.7 %   Total Lymphocyte 38.7 %   Monocytes Relative 6.5 %   Eosinophils Relative 3.3 %   Basophils Relative 0.8 %  COMPLETE METABOLIC PANEL WITH GFR  Result Value Ref Range   Glucose, Bld 93 65 - 99 mg/dL   BUN 9 7 - 25 mg/dL   Creat 9.56 2.13 - 0.86 mg/dL   eGFR 70 > OR = 60 VH/QIO/9.62X5   BUN/Creatinine Ratio SEE NOTE: 6 - 22 (calc)   Sodium 140 135 - 146 mmol/L   Potassium 4.4 3.5 - 5.3 mmol/L   Chloride 104 98 - 110 mmol/L   CO2 29 20 - 32 mmol/L   Calcium 9.1 8.6 - 10.4 mg/dL   Total Protein 6.4 6.1 - 8.1 g/dL   Albumin 3.9 3.6 - 5.1 g/dL   Globulin 2.5 1.9 - 3.7 g/dL (calc)   AG Ratio 1.6 1.0 - 2.5 (calc)   Total Bilirubin 0.6 0.2 - 1.2 mg/dL   Alkaline phosphatase (APISO) 75 37 - 153 U/L   AST 16 10 - 35 U/L   ALT 13 6 - 29 U/L      Assessment & Plan:   Problem List Items Addressed This Visit     Fibromyalgia   Relevant Medications   FLUoxetine (PROZAC) 40 MG capsule   GAD (generalized anxiety disorder)   Relevant Medications   FLUoxetine (PROZAC) 40 MG capsule   Hypothyroidism   Relevant Medications   levothyroxine (SYNTHROID) 100 MCG tablet   Major depression, recurrent, chronic (HCC) - Primary   Relevant Medications    FLUoxetine (PROZAC) 40 MG capsule   Other Visit Diagnoses     Psychophysiologic insomnia       B12 deficiency       Relevant Medications   cyanocobalamin (VITAMIN B12) 1000 MCG/ML injection       Return for Mon 7/1 morning for Nurse visit B12 injection before your trip  We can repeat the lab after in future  Rx sent to [facility] mail order for Levothyroxine 100 mcg and Fluoxetine 40mg   Okay to take Tizanidine as needed for muscle / leg pain as well. Contact me if you need more.  Peripheral sensory neuropathy bilateral lower extremity Stable without any painful neuropathic concerns A1c stable unlikely related Recommend Alpha Lipoic Acid supplement for nerves, 300 to 600mg  per dose up to 2 to 3 times a day.  Meds ordered this encounter  Medications   levothyroxine (SYNTHROID) 100 MCG tablet    Sig: Take 1 tablet (100 mcg total) by mouth daily before breakfast.  Dispense:  90 tablet    Refill:  3   cyanocobalamin (VITAMIN B12) 1000 MCG/ML injection    Sig: Inject 1 mL (1,000 mcg total) into the muscle every 30 (thirty) days. For 4 weeks    Dispense:  1 mL    Refill:  0   FLUoxetine (PROZAC) 40 MG capsule    Sig: Take 1 capsule (40 mg total) by mouth daily.    Dispense:  90 capsule    Refill:  1      Follow up plan: Return for Upcoming Vitamin B12 injection 7/1 nurse visit AM .  Saralyn Pilar, DO [facility] 06/23/2023, 10:54 AM

## 2023-06-24 ENCOUNTER — Ambulatory Visit: Payer: Self-pay | Admitting: Licensed Clinical Social Worker

## 2023-06-24 NOTE — Patient Outreach (Signed)
  Care Coordination  Follow Up Visit Note   06/24/2023 Name: Stacey Duncan MRN: [id number] DOB: Sep 21, 1931  Stacey Duncan is a 89 y.o. year old female who sees Smitty Cords, DO for primary care. I spoke with  Stacey Duncan by phone today.  What matters to the patients health and wellness today?  Finding a therapist  Patient reports she is in a good place today.  PCP refilled prozac, her daughter is coming to visit and they are planning a family trip to [place]. Patient will contact therapist discussed today and call for a consultation to see if this is a good fit for her.   Goals Addressed             This Visit's Progress    Reduce Stress and locate a therapist       Activities and task to complete in order to accomplish goals.   I am sorry we are having a difficulty time with locating a therapist that is in-network with your insurance.  I will continue to explore other options.  Per your request I have contacted providers to explore private pay options. Based on your request I have reviewed options with you and you have selected  Stacey Duncan, Psychotherapist  [phone]   $90.00 private pay ( 120 min for initial call will not charge extra)  50 min. Goinging forward Keep all upcoming appointment discussed today Continue with your relaxed breathing  Continue to work on your advance directive to get it notarized         SDOH assessments and interventions completed:  No   Care Coordination Interventions:  Yes, provided  Interventions Today    Flowsheet Row Most Recent Value  Chronic Disease   Chronic disease during today's visit Hypertension (HTN)  General Interventions   General Interventions Discussed/Reviewed Communication with  Communication with --  [mental health providers for private pay therapy options]  Education Interventions   Education Provided Provided Education  Provided Verbal Education On Mental Health/Coping with Illness   Mental Health Interventions   Mental Health Discussed/Reviewed Mental Health Discussed, Anxiety, Depression  [solution focused, task center, problem solving]       Follow up plan:  No follow up scheduled, patient states she will call LCSW.  If no call in 30 days LCSW will contact patient.    Encounter Outcome:  Pt. Visit Completed   Sammuel Hines, LCSW Social Work Care Coordination  [name] [phone]

## 2023-06-24 NOTE — Patient Instructions (Signed)
Social Work Visit Information  Thank you for taking time to visit with me today. Please don't hesitate to contact me if I can be of assistance to you.   Following are the goals we discussed today:   Goals Addressed             This Visit's Progress    Reduce Stress and locate a therapist       Activities and task to complete in order to accomplish goals.   I am sorry we are having a difficulty time with locating a therapist that is in-network with your insurance.  I will continue to explore other options.  Per your request I have contacted providers to explore private pay options. Based on your request I have reviewed options with you and you have selected  Dr. Micael Hampshire, Psychotherapist  [phone]   $90.00 private pay ( 120 min for initial call will not charge extra)  50 min. Goinging forward Keep all upcoming appointment discussed today Continue with your relaxed breathing  Continue to work on your advance directive to get it notarized          You did not desire to schedule a follow up. LCSW will follow up in 30 days if no returned call .Patient will call office if needed.   Please call the care guide team at [phone] if you need to cancel or reschedule your appointment.    The patient verbalized understanding of instructions, educational materials, and care plan provided today and DECLINED offer to receive copy of patient instructions, educational materials, and care plan.     Sammuel Hines, LCSW Social Work Care Coordination  [facility] [name] [phone]

## 2023-06-28 ENCOUNTER — Ambulatory Visit (INDEPENDENT_AMBULATORY_CARE_PROVIDER_SITE_OTHER): Payer: Medicare PPO

## 2023-06-28 DIAGNOSIS — E538 Deficiency of other specified B group vitamins: Secondary | ICD-10-CM | POA: Diagnosis not present

## 2023-06-28 MED ORDER — CYANOCOBALAMIN 1000 MCG/ML IJ SOLN
1000.0000 ug | Freq: Once | INTRAMUSCULAR | Status: AC
Start: 2023-06-28 — End: 2023-06-28
  Administered 2023-06-28: 1000 ug via INTRAMUSCULAR

## 2023-06-28 NOTE — Progress Notes (Signed)
After obtaining consent, and per orders of Dr. Althea Charon, injection of 1 ML Cyanocobalamin given by Lonna Cobb. Patient tolerated well, no reactions noted.

## 2023-07-14 ENCOUNTER — Other Ambulatory Visit: Payer: Self-pay | Admitting: Family Medicine

## 2023-07-14 ENCOUNTER — Telehealth: Payer: Medicare PPO

## 2023-07-14 ENCOUNTER — Ambulatory Visit (INDEPENDENT_AMBULATORY_CARE_PROVIDER_SITE_OTHER): Payer: Medicare PPO

## 2023-07-14 DIAGNOSIS — E032 Hypothyroidism due to medicaments and other exogenous substances: Secondary | ICD-10-CM

## 2023-07-14 DIAGNOSIS — E538 Deficiency of other specified B group vitamins: Secondary | ICD-10-CM

## 2023-07-14 DIAGNOSIS — F339 Major depressive disorder, recurrent, unspecified: Secondary | ICD-10-CM

## 2023-07-14 DIAGNOSIS — I480 Paroxysmal atrial fibrillation: Secondary | ICD-10-CM

## 2023-07-14 DIAGNOSIS — F439 Reaction to severe stress, unspecified: Secondary | ICD-10-CM

## 2023-07-14 DIAGNOSIS — I10 Essential (primary) hypertension: Secondary | ICD-10-CM

## 2023-07-14 DIAGNOSIS — F411 Generalized anxiety disorder: Secondary | ICD-10-CM

## 2023-07-14 NOTE — Chronic Care Management (AMB) (Signed)
Chronic Care Management   CCM RN Visit Note  07/14/2023 Name: Stacey Duncan MRN: [id number] DOB: 1931-12-05  Subjective: Stacey Duncan is a 89 y.o. year old female who is a primary care patient of Smitty Cords, DO. The patient was referred to the Chronic Care Management team for assistance with care management needs subsequent to provider initiation of CCM services and plan of care.    Today's Visit:  Engaged with patient by telephone for follow up visit.     SDOH Interventions Today    Flowsheet Row Most Recent Value  SDOH Interventions   Health Literacy Interventions Intervention Not Indicated, Other (Comment)  [the patient has a good understanding of things]         Goals Addressed             This Visit's Progress    CCM Expected Outcome:  Monitor, Self-Manage and Reduce Symptoms of Afib       Current Barriers:  Chronic Disease Management support and education needs related to effective management of AFIB BP Readings from Last 3 Encounters:  06/23/23 118/70  05/04/23 122/60  04/13/23 (!) 102/58     Planned Interventions: Provider order and care plan reviewed. Collaborated with PharmD regarding patient care and plan. AFIB is stable. The patients blood pressures are more stable. She is still having fatigue but thinks the Vitamin B12 shots are helping her. The patient did have questions about the Vitamin B12 shots if she would continue to get them or what the plan would be. A secure message sent to the pcp asking for recommendations on if she needs more Vitamin B shots or if she needs to have blood work first. The patient verbalized understanding.  Counseled on increased risk of stroke due to Afib and benefits of anticoagulation for stroke prevention           Reviewed importance of adherence to anticoagulant exactly as prescribed. The patient is compliant with her medications. Denies any new concerns related to medication administration.  Advised  patient to discuss changes in AFIB, questions, or concerns with provider Counseled on bleeding risk associated with AFIB and importance of self-monitoring for signs/symptoms of bleeding Counseled on avoidance of NSAIDs due to increased bleeding risk with anticoagulants Counseled on importance of regular laboratory monitoring as prescribed. Has labs on a regular basis.  Counseled on seeking medical attention after a head injury or if there is blood in the urine/stool Afib action plan reviewed Screening for signs and symptoms of depression related to chronic disease state Assessed social determinant of health barriers  Symptom Management: Take medications as prescribed   Attend all scheduled provider appointments Call provider office for new concerns or questions  call the Suicide and Crisis Lifeline: 988 call the [place] National Suicide Prevention Lifeline: 217-559-5290 or TTY: 530-718-5874 TTY [phone]) to talk to a trained counselor call 1-800-273-TALK (toll free, 24 hour hotline) if experiencing a Mental Health or Behavioral Health Crisis  - make a plan to eat healthy - keep all lab appointments - take medicine as prescribed  Follow Up Plan: Telephone follow up appointment with care management team member scheduled for: 08-16-2023 at 100 pm       CCM Expected Outcome:  Monitor, Self-Manage and Reduce Symptoms of: depression, anxiety, stress       Current Barriers:  Knowledge Deficits related to resources to help with questions she has and help for her adult son who lives with her and her husband that is 32  and is bipolar  Care Coordination needs related to social work support and resources to help with managing her depression, anxiety, and stress and get help with needs and concerns related to her adult son in a patient with depression, anxiety, and stress Chronic Disease Management support and education needs related to effective management of depression, anxiety, and  stress  Planned Interventions: Evaluation of current treatment plan related to depression, anxiety, and stress and patient's adherence to plan as established by provider. The patient has good days and bad days with her depression, anxiety, and stress levels. She saw a therapist in [place] last week and really liked him. She said he was an older gentleman and he was very helpful. She is going to work with him. She says that she has to find ways of coping because the things she gets so stressed about are out of her control. Her daughter is coming next week from [place] for a week and is looking forward to t his. Her daughter will help her with some lose ends that need to be taken care of. She says that her husband is unpredictable with his anger and has good days and bad days. Reflective listening and support give to the patient. Will let the patient know the recommendations of the pcp. Advised patient to call the office for changes in her mood, anxiety, depression, stress level, or other mental health needs. Provided education to patient re: mindfulness, journal writing, and making sure she practices self care and getting what she needs to manage her mental health and well being. The patient is feeling better and thankful that she has support and a therapist now. She is happy her daughter is coming to help with things. She talked a lot about self care and making sure she is doing what she needs to do for her health and well being. Reflective listening and support given Collaborated with pcp and office CMA regarding Vitamin B12 injections. Is getting Vit B12 injections without difficulty. Feels these are helping her a lot.  Provided patient with bipolar  educational materials related to the patient wanting information on bipolar to help her understand her sons condition as well as possible Reviewed scheduled/upcoming provider appointments including: Saw pcp in June and had a Vitamin B12 injection on  06-28-2023 Social Work referral for assistance for resources for her adult son who is bipolar 1 and also for effective management of her mental health and well being. Ongoing support from the LCSW for support and education Discussed plans with patient for ongoing care management follow up and provided patient with direct contact information for care management team Advised patient to discuss changes in her mood, anxiety, stress, depression or mental health with provider Screening for signs and symptoms of depression related to chronic disease state  Assessed social determinant of health barriers  Symptom Management: Take medications as prescribed   Attend all scheduled provider appointments Call provider office for new concerns or questions  call the Suicide and Crisis Lifeline: 988 call the [place] National Suicide Prevention Lifeline: (216)457-8992 or TTY: 224-415-5086 TTY 205-401-1927) to talk to a trained counselor call 1-800-273-TALK (toll free, 24 hour hotline) if experiencing a Mental Health or Behavioral Health Crisis   Follow Up Plan: Telephone follow up appointment with care management team member scheduled for:  08-16-2023 at 1 pm       CCM Expected Outcome:  Monitor, Self-Manage, and Reduce Symptoms of Hypertension       Current Barriers:  Care Coordination needs  related to changes in her blood pressures and sx and sx that she is having that may be related to blood pressures or other chronic conditions  in a patient with HTN and other heart conditions Chronic Disease Management support and education needs related to effective management of HTN  BP Readings from Last 3 Encounters:  06/23/23 118/70  05/04/23 122/60  04/13/23 (!) 102/58     Planned Interventions: Evaluation of current treatment plan related to hypertension self management and patient's adherence to plan as established by provider. The patient saw her cardiologist last week and one of her medications was put on  hold.  The patient is having a good day today. Blood pressures are better and she is not having any issues with it dropping like it was. Knows to change position slowly and report any new concerns, questions, or issues.  Provided education to patient re: stroke prevention, s/s of heart attack and stroke; Reviewed prescribed diet heart healthy diet. Review and education done.  Reviewed medications with patient and discussed importance of compliance. The patient is complaint with medications. Denies any new concerns related to medications. ;  Discussed plans with patient for ongoing care management follow up and provided patient with direct contact information for care management team; Advised patient, providing education and rationale, to monitor blood pressure daily and record, calling PCP for findings outside established parameters;  Reviewed scheduled/upcoming provider appointments including:  Saw the pcp on 06-18-2023, no upcoming appointments  Advised patient to discuss changes in her blood pressures, questions, or concerns  with provider; Provided education on prescribed diet heart healthy diet. Is losing weight and discussed monitoring dietary habits, making sure she is staying hydrated;  Discussed complications of poorly controlled blood pressure such as heart disease, stroke, circulatory complications, vision complications, kidney impairment, sexual dysfunction;  Screening for signs and symptoms of depression related to chronic disease state;  Assessed social determinant of health barriers;   Symptom Management: Take medications as prescribed   Attend all scheduled provider appointments Call provider office for new concerns or questions  call the Suicide and Crisis Lifeline: 988 call the [place] National Suicide Prevention Lifeline: 4137864482 or TTY: 613-075-0838 TTY 478 530 2050) to talk to a trained counselor call 1-800-273-TALK (toll free, 24 hour hotline) if experiencing a Mental  Health or Behavioral Health Crisis  learn about high blood pressure take blood pressure log to all doctor appointments call doctor for signs and symptoms of high blood pressure develop an action plan for high blood pressure keep all doctor appointments take medications for blood pressure exactly as prescribed report new symptoms to your doctor  Follow Up Plan: Telephone follow up appointment with care management team member scheduled for: 08-16-2023 at 100pm          Plan:Telephone follow up appointment with care management team member scheduled for:  08-16-2023 at 1 pm  Alto Denver RN, MSN, CCM RN Care Manager  Chronic Care Management Direct Number: [phone]

## 2023-07-14 NOTE — Patient Instructions (Signed)
Please call the care guide team at [phone] if you need to cancel or reschedule your appointment.   If you are experiencing a Mental Health or Behavioral Health Crisis or need someone to talk to, please call the Suicide and Crisis Lifeline: 988 call the [place] National Suicide Prevention Lifeline: 859-504-0260 or TTY: (626) 548-6875 TTY [phone]) to talk to a trained counselor call 1-800-273-TALK (toll free, 24 hour hotline)   Following is a copy of the CCM Program Consent:  CCM service includes personalized support from designated clinical staff supervised by the physician, including individualized plan of care and coordination with other care providers 24/7 contact phone numbers for assistance for urgent and routine care needs. Service will only be billed when office clinical staff spend 20 minutes or more in a month to coordinate care. Only one practitioner may furnish and bill the service in a calendar month. The patient may stop CCM services at amy time (effective at the end of the month) by phone call to the office staff. The patient will be responsible for cost sharing (co-pay) or up to 20% of the service fee (after annual deductible is met)  Following is a copy of your full provider care plan:   Goals Addressed             This Visit's Progress    CCM Expected Outcome:  Monitor, Self-Manage and Reduce Symptoms of Afib       Current Barriers:  Chronic Disease Management support and education needs related to effective management of AFIB BP Readings from Last 3 Encounters:  06/23/23 118/70  05/04/23 122/60  04/13/23 (!) 102/58     Planned Interventions: Provider order and care plan reviewed. Collaborated with PharmD regarding patient care and plan. AFIB is stable. The patients blood pressures are more stable. She is still having fatigue but thinks the Vitamin B12 shots are helping her. The patient did have questions about the Vitamin B12 shots if she would continue to get  them or what the plan would be. A secure message sent to the pcp asking for recommendations on if she needs more Vitamin B shots or if she needs to have blood work first. The patient verbalized understanding.  Counseled on increased risk of stroke due to Afib and benefits of anticoagulation for stroke prevention           Reviewed importance of adherence to anticoagulant exactly as prescribed. The patient is compliant with her medications. Denies any new concerns related to medication administration.  Advised patient to discuss changes in AFIB, questions, or concerns with provider Counseled on bleeding risk associated with AFIB and importance of self-monitoring for signs/symptoms of bleeding Counseled on avoidance of NSAIDs due to increased bleeding risk with anticoagulants Counseled on importance of regular laboratory monitoring as prescribed. Has labs on a regular basis.  Counseled on seeking medical attention after a head injury or if there is blood in the urine/stool Afib action plan reviewed Screening for signs and symptoms of depression related to chronic disease state Assessed social determinant of health barriers  Symptom Management: Take medications as prescribed   Attend all scheduled provider appointments Call provider office for new concerns or questions  call the Suicide and Crisis Lifeline: 988 call the [place] National Suicide Prevention Lifeline: (657)731-2341 or TTY: 919-582-6381 TTY (272)837-3508) to talk to a trained counselor call 1-800-273-TALK (toll free, 24 hour hotline) if experiencing a Mental Health or Behavioral Health Crisis  - make a plan to eat healthy - keep all lab  appointments - take medicine as prescribed  Follow Up Plan: Telephone follow up appointment with care management team member scheduled for: 08-16-2023 at 100 pm       CCM Expected Outcome:  Monitor, Self-Manage and Reduce Symptoms of: depression, anxiety, stress       Current Barriers:  Knowledge  Deficits related to resources to help with questions she has and help for her adult son who lives with her and her husband that is 77 and is bipolar  Care Coordination needs related to social work support and resources to help with managing her depression, anxiety, and stress and get help with needs and concerns related to her adult son in a patient with depression, anxiety, and stress Chronic Disease Management support and education needs related to effective management of depression, anxiety, and stress  Planned Interventions: Evaluation of current treatment plan related to depression, anxiety, and stress and patient's adherence to plan as established by provider. The patient has good days and bad days with her depression, anxiety, and stress levels. She saw a therapist in [place] last week and really liked him. She said he was an older gentleman and he was very helpful. She is going to work with him. She says that she has to find ways of coping because the things she gets so stressed about are out of her control. Her daughter is coming next week from [place] for a week and is looking forward to t his. Her daughter will help her with some lose ends that need to be taken care of. She says that her husband is unpredictable with his anger and has good days and bad days. Reflective listening and support give to the patient. Will let the patient know the recommendations of the pcp. Advised patient to call the office for changes in her mood, anxiety, depression, stress level, or other mental health needs. Provided education to patient re: mindfulness, journal writing, and making sure she practices self care and getting what she needs to manage her mental health and well being. The patient is feeling better and thankful that she has support and a therapist now. She is happy her daughter is coming to help with things. She talked a lot about self care and making sure she is doing what she needs to do for her health  and well being. Reflective listening and support given Collaborated with pcp and office CMA regarding Vitamin B12 injections. Is getting Vit B12 injections without difficulty. Feels these are helping her a lot.  Provided patient with bipolar  educational materials related to the patient wanting information on bipolar to help her understand her sons condition as well as possible Reviewed scheduled/upcoming provider appointments including: Saw pcp in June and had a Vitamin B12 injection on 06-28-2023 Social Work referral for assistance for resources for her adult son who is bipolar 1 and also for effective management of her mental health and well being. Ongoing support from the LCSW for support and education Discussed plans with patient for ongoing care management follow up and provided patient with direct contact information for care management team Advised patient to discuss changes in her mood, anxiety, stress, depression or mental health with provider Screening for signs and symptoms of depression related to chronic disease state  Assessed social determinant of health barriers  Symptom Management: Take medications as prescribed   Attend all scheduled provider appointments Call provider office for new concerns or questions  call the Suicide and Crisis Lifeline: 988 call the [place] National Suicide  Prevention Lifeline: (629)534-3117 or TTY: [phone] TTY [phone]) to talk to a trained counselor call 1-800-273-TALK (toll free, 24 hour hotline) if experiencing a Mental Health or Behavioral Health Crisis   Follow Up Plan: Telephone follow up appointment with care management team member scheduled for:  08-16-2023 at 1 pm       CCM Expected Outcome:  Monitor, Self-Manage, and Reduce Symptoms of Hypertension       Current Barriers:  Care Coordination needs related to changes in her blood pressures and sx and sx that she is having that may be related to blood pressures or other chronic conditions   in a patient with HTN and other heart conditions Chronic Disease Management support and education needs related to effective management of HTN  BP Readings from Last 3 Encounters:  06/23/23 118/70  05/04/23 122/60  04/13/23 (!) 102/58     Planned Interventions: Evaluation of current treatment plan related to hypertension self management and patient's adherence to plan as established by provider. The patient saw her cardiologist last week and one of her medications was put on hold.  The patient is having a good day today. Blood pressures are better and she is not having any issues with it dropping like it was. Knows to change position slowly and report any new concerns, questions, or issues.  Provided education to patient re: stroke prevention, s/s of heart attack and stroke; Reviewed prescribed diet heart healthy diet. Review and education done.  Reviewed medications with patient and discussed importance of compliance. The patient is complaint with medications. Denies any new concerns related to medications. ;  Discussed plans with patient for ongoing care management follow up and provided patient with direct contact information for care management team; Advised patient, providing education and rationale, to monitor blood pressure daily and record, calling PCP for findings outside established parameters;  Reviewed scheduled/upcoming provider appointments including:  Saw the pcp on 06-18-2023, no upcoming appointments  Advised patient to discuss changes in her blood pressures, questions, or concerns  with provider; Provided education on prescribed diet heart healthy diet. Is losing weight and discussed monitoring dietary habits, making sure she is staying hydrated;  Discussed complications of poorly controlled blood pressure such as heart disease, stroke, circulatory complications, vision complications, kidney impairment, sexual dysfunction;  Screening for signs and symptoms of depression related  to chronic disease state;  Assessed social determinant of health barriers;   Symptom Management: Take medications as prescribed   Attend all scheduled provider appointments Call provider office for new concerns or questions  call the Suicide and Crisis Lifeline: 988 call the [place] National Suicide Prevention Lifeline: 503-077-5177 or TTY: (913)084-2696 TTY 818-026-9993) to talk to a trained counselor call 1-800-273-TALK (toll free, 24 hour hotline) if experiencing a Mental Health or Behavioral Health Crisis  learn about high blood pressure take blood pressure log to all doctor appointments call doctor for signs and symptoms of high blood pressure develop an action plan for high blood pressure keep all doctor appointments take medications for blood pressure exactly as prescribed report new symptoms to your doctor  Follow Up Plan: Telephone follow up appointment with care management team member scheduled for: 08-16-2023 at 100pm          The patient verbalized understanding of instructions, educational materials, and care plan provided today and DECLINED offer to receive copy of patient instructions, educational materials, and care plan.  Telephone follow up appointment with care management team member scheduled for: 08-16-2023 at 1 pm

## 2023-07-28 DIAGNOSIS — I4891 Unspecified atrial fibrillation: Secondary | ICD-10-CM | POA: Diagnosis not present

## 2023-07-28 DIAGNOSIS — F32A Depression, unspecified: Secondary | ICD-10-CM

## 2023-07-28 DIAGNOSIS — I10 Essential (primary) hypertension: Secondary | ICD-10-CM | POA: Diagnosis not present

## 2023-08-16 ENCOUNTER — Telehealth: Payer: Medicare PPO

## 2023-08-18 ENCOUNTER — Other Ambulatory Visit: Payer: Self-pay | Admitting: Physician Assistant

## 2023-08-26 ENCOUNTER — Telehealth: Payer: Self-pay | Admitting: *Deleted

## 2023-08-26 ENCOUNTER — Ambulatory Visit (INDEPENDENT_AMBULATORY_CARE_PROVIDER_SITE_OTHER): Payer: Medicare PPO

## 2023-08-26 DIAGNOSIS — Z0000 Encounter for general adult medical examination without abnormal findings: Secondary | ICD-10-CM | POA: Diagnosis not present

## 2023-08-26 NOTE — Progress Notes (Signed)
Subjective:   Stacey Duncan is a 89 y.o. female who presents for Medicare Annual (Subsequent) preventive examination.  Visit Complete: Virtual  I connected with  RASHAUNDA ROEVER on 08/26/23 by a audio enabled telemedicine application and verified that I am speaking with the correct person using two identifiers.  Patient Location: Home  Provider Location: Office/Clinic  I discussed the limitations of evaluation and management by telemedicine. The patient expressed understanding and agreed to proceed.  Vital Signs: Unable to obtain new vitals due to this being a telehealth visit.   Review of Systems     Cardiac Risk Factors include: advanced age (>2men, >71 women);dyslipidemia;hypertension;sedentary lifestyle     Objective:    Today's Vitals   08/26/23 1058  PainSc: 3    There is no height or weight on file to calculate BMI.     08/26/2023   11:05 AM 03/18/2021    2:48 PM 08/08/2019    2:24 PM 07/12/2018    2:28 PM 05/27/2018   12:29 PM 10/15/2017    4:47 AM 10/15/2017    1:44 AM  Advanced Directives  Does Patient Have a Medical Advance Directive? No No No No No No No  Would patient like information on creating a medical advance directive? No - Patient declined   Yes (MAU/Ambulatory/Procedural Areas - Information given)  Yes (Inpatient - patient requests chaplain consult to create a medical advance directive)     Current Medications (verified) Outpatient Encounter Medications as of 08/26/2023  Medication Sig   atorvastatin (LIPITOR) 80 MG tablet TAKE 1 TABLET BY MOUTH ONCE DAILY AT  6PM   Cholecalciferol 25 MCG (1000 UT) tablet Take 1,000 Units by mouth daily.    clobetasol cream (TEMOVATE) 0.05 % Apply 1 application topically 2 (two) times daily as needed (skin irritation).   cyanocobalamin (VITAMIN B12) 1000 MCG/ML injection Inject 1 mL (1,000 mcg total) into the muscle every 30 (thirty) days. For 4 weeks   ELIQUIS 5 MG TABS tablet TAKE 1 TABLET TWICE DAILY    FLUoxetine (PROZAC) 40 MG capsule Take 1 capsule (40 mg total) by mouth daily.   furosemide (LASIX) 20 MG tablet TAKE 1 TABLET EVERY DAY   gabapentin (NEURONTIN) 300 MG capsule Take 2 capsules (600 mg total) by mouth at bedtime.   levothyroxine (SYNTHROID) 100 MCG tablet Take 1 tablet (100 mcg total) by mouth daily before breakfast.   metoprolol tartrate (LOPRESSOR) 25 MG tablet TAKE 1/2 TABLET TWICE A DAY. (Patient taking differently: Take 25 mg by mouth daily. TAKE 1/2 TABLET TWICE A DAY.)   omeprazole (PRILOSEC) 40 MG capsule Take 1 capsule (40 mg total) by mouth daily as needed.   tiZANidine (ZANAFLEX) 2 MG tablet Take 1 tablet (2 mg total) by mouth every 6 (six) hours as needed for muscle spasms.   No facility-administered encounter medications on file as of 08/26/2023.    Allergies (verified) Sulfa antibiotics and Sulfacetamide sodium   History: Past Medical History:  Diagnosis Date   Anxiety    Fibromyalgia    GERD (gastroesophageal reflux disease)    History of echocardiogram    a. 02/2016 Echo: EF 60-65%, no rwma. Nl RV fxn and PASP.   HTN (hypertension)    Hypothyroidism    Mixed hyperlipidemia    Osteoarthritis    a. bilateral knees   PAF (paroxysmal atrial fibrillation) (HCC)    a. 02/2015 & 09/2017 - admit w/ Afib, briefly req Amio; b. CHA2DSVASc = 4-->Eliquis.   Recurrent UTI (urinary  tract infection)    Sinus bradycardia    a. Has limited beta blocker dosing.   Past Surgical History:  Procedure Laterality Date   ABDOMINAL HYSTERECTOMY     CHOLECYSTECTOMY     MOHS SURGERY Right    leg   ROTATOR CUFF REPAIR     left    Family History  Problem Relation Age of Onset   Heart disease Mother 10       CABG   Heart attack Maternal Grandfather    Heart attack Maternal Grandmother    Social History   Socioeconomic History   Marital status: Married    Spouse name: Not on file   Number of children: Not on file   Years of education: Not on file   Highest  education level: Bachelor's degree (e.g., BA, AB, BS)  Occupational History   Occupation: retired  Tobacco Use   Smoking status: Never   Smokeless tobacco: Never  Vaping Use   Vaping status: Never Used  Substance and Sexual Activity   Alcohol use: Yes    Alcohol/week: 7.0 standard drinks of alcohol    Types: 7 Glasses of wine per week    Comment: glass of wine daily   Drug use: No   Sexual activity: Not Currently    Birth control/protection: Post-menopausal  Other Topics Concern   Not on file  Social History Narrative   Not on file   Social Determinants of Health   Financial Resource Strain: Low Risk  (08/26/2023)   Overall Financial Resource Strain (CARDIA)    Difficulty of Paying Living Expenses: Not hard at all  Food Insecurity: No Food Insecurity (08/26/2023)   Hunger Vital Sign    Worried About Running Out of Food in the Last Year: Never true    Ran Out of Food in the Last Year: Never true  Transportation Needs: No Transportation Needs (08/26/2023)   PRAPARE - Administrator, Civil Service (Medical): No    Lack of Transportation (Non-Medical): No  Physical Activity: Inactive (08/26/2023)   Exercise Vital Sign    Days of Exercise per Week: 0 days    Minutes of Exercise per Session: 0 min  Stress: Stress Concern Present (08/26/2023)   [employer] of Occupational Health - Occupational Stress Questionnaire    Feeling of Stress : To some extent  Social Connections: Moderately Integrated (08/26/2023)   Social Connection and Isolation Panel [NHANES]    Frequency of Communication with Friends and Family: More than three times a week    Frequency of Social Gatherings with Friends and Family: More than three times a week    Attends Religious Services: More than 4 times per year    Active Member of [employer] or Organizations: No    Attends Engineer, structural: Never    Marital Status: Married    Tobacco Counseling Counseling given: Not  Answered   Clinical Intake:  Pre-visit preparation completed: Yes  Pain : 0-10 Pain Score: 3  Pain Type: Chronic pain Pain Location: Back Pain Orientation: Lower Pain Descriptors / Indicators: Aching, Sore Pain Onset: Yesterday Pain Frequency: Occasional     Nutritional Risks: None Diabetes: No  How often do you need to have someone help you when you read instructions, pamphlets, or other written materials from your doctor or pharmacy?: 1 - Never  Interpreter Needed?: No  Information entered by :: Kennedy Bucker, LPN   Activities of Daily Living    08/26/2023   11:07 AM 04/13/2023  11:22 AM  In your present state of health, do you have any difficulty performing the following activities:  Hearing? 0 0  Vision? 0 0  Difficulty concentrating or making decisions? 0 0  Walking or climbing stairs? 1 1  Dressing or bathing? 0 1  Doing errands, shopping? 1 0  Preparing Food and eating ? N   Using the Toilet? N   In the past six months, have you accidently leaked urine? N   Do you have problems with loss of bowel control? N   Managing your Medications? N   Managing your Finances? N   Housekeeping or managing your Housekeeping? Y     Patient Care Team: Smitty Cords, DO as PCP - General (Family Medicine) Mariah Milling Tollie Pizza, MD as PCP - Cardiology (Cardiology) Antonieta Iba, MD as Consulting Physician (Cardiology) Riki Altes, MD (Urology) Juanell Fairly, MD as Referring Physician (Orthopedic Surgery) Marlowe Sax, RN as Case Manager (General Practice)  Indicate any recent Medical Services you may have received from other than [facility] providers in the past year (date may be approximate).     Assessment:   This is a routine wellness examination for Ahaana.  Hearing/Vision screen Hearing Screening - Comments:: No aids Vision Screening - Comments:: No glasses, had cataract sgy- Dr.Dingeldein  Dietary issues and exercise activities discussed:      Goals Addressed             This Visit's Progress    DIET - EAT MORE FRUITS AND VEGETABLES         Depression Screen    08/26/2023   11:03 AM 06/23/2023   10:56 AM 04/13/2023   11:21 AM 03/17/2023    3:18 PM 07/23/2021    1:33 PM 06/24/2021    1:31 PM 03/18/2021    2:49 PM  PHQ 2/9 Scores  PHQ - 2 Score 2 1 1 2 2 2 1   PHQ- 9 Score 4 5 6 7 7 7 4     Fall Risk    08/26/2023   11:06 AM 07/14/2023    1:21 PM 05/19/2023    1:27 PM 04/13/2023   11:21 AM 03/23/2023    2:45 PM  Fall Risk   Falls in the past year? 0 0 0 0 0  Number falls in past yr: 0 0 0 0 0  Injury with Fall? 0 0 0 0 0  Risk for fall due to : No Fall Risks Impaired balance/gait;Impaired mobility Impaired balance/gait;Impaired mobility  Impaired balance/gait;Impaired mobility  Follow up Falls prevention discussed Falls evaluation completed;Education provided;Falls prevention discussed Falls evaluation completed;Education provided;Falls prevention discussed  Falls evaluation completed;Education provided;Falls prevention discussed    MEDICARE RISK AT HOME: Medicare Risk at Home Any stairs in or around the home?: Yes If so, are there any without handrails?: No Home free of loose throw rugs in walkways, pet beds, electrical cords, etc?: Yes Adequate lighting in your home to reduce risk of falls?: Yes Life alert?: No Use of a cane, walker or w/c?: Yes (cane) Grab bars in the bathroom?: No Shower chair or bench in shower?: No Elevated toilet seat or a handicapped toilet?: No  TIMED UP AND GO:  Was the test performed?  No    Cognitive Function:        08/26/2023   11:08 AM 03/18/2021    2:56 PM 07/12/2018    2:29 PM 07/20/2017    4:01 PM  6CIT Screen  What Year?  0 points 0  points 0 points  What month?  0 points 0 points 0 points  What time? 0 points 0 points 0 points 0 points  Count back from 20 0 points 0 points 0 points 0 points  Months in reverse 0 points 0 points 0 points 0 points  Repeat phrase 0  points 4 points 0 points 0 points  Total Score  4 points 0 points 0 points    Immunizations Immunization History  Administered Date(s) Administered   Fluad Quad(high Dose 65+) 10/24/2019, 09/25/2020, 10/23/2022   Influenza, High Dose Seasonal PF 09/30/2015, 10/22/2016, 10/20/2017, 10/26/2018, 11/02/2021   PFIZER(Purple Top)SARS-COV-2 Vaccination 01/04/2020, 01/25/2020, 12/11/2020    TDAP status: Due, Education has been provided regarding the importance of this vaccine. Advised may receive this vaccine at local pharmacy or Health Dept. Aware to provide a copy of the vaccination record if obtained from local pharmacy or Health Dept. Verbalized acceptance and understanding.  Flu Vaccine status: Up to date  Pneumococcal vaccine status: Declined,  Education has been provided regarding the importance of this vaccine but patient still declined. Advised may receive this vaccine at local pharmacy or Health Dept. Aware to provide a copy of the vaccination record if obtained from local pharmacy or Health Dept. Verbalized acceptance and understanding.   Covid-19 vaccine status: Completed vaccines  Qualifies for Shingles Vaccine? Yes   Zostavax completed No   Shingrix Completed?: No.    Education has been provided regarding the importance of this vaccine. Patient has been advised to call insurance company to determine out of pocket expense if they have not yet received this vaccine. Advised may also receive vaccine at local pharmacy or Health Dept. Verbalized acceptance and understanding.  Screening Tests Health Maintenance  Topic Date Due   DTaP/Tdap/Td (1 - Tdap) Never done   Zoster Vaccines- Shingrix (1 of 2) Never done   Pneumonia Vaccine 54+ Years old (1 of 1 - PCV) Never done   COVID-19 Vaccine (4 - 2023-24 season) 08/28/2022   INFLUENZA VACCINE  07/29/2023   Medicare Annual Wellness (AWV)  08/25/2024   DEXA SCAN  Completed   HPV VACCINES  Aged Out    Health Maintenance  Health  Maintenance Due  Topic Date Due   DTaP/Tdap/Td (1 - Tdap) Never done   Zoster Vaccines- Shingrix (1 of 2) Never done   Pneumonia Vaccine 36+ Years old (1 of 1 - PCV) Never done   COVID-19 Vaccine (4 - 2023-24 season) 08/28/2022   INFLUENZA VACCINE  07/29/2023    Colorectal cancer screening: No longer required.   Mammogram status: No longer required due to age.   Lung Cancer Screening: (Low Dose CT Chest recommended if Age 54-80 years, 20 pack-year currently smoking OR have quit w/in 15years.) does not qualify.    Additional Screening:  Hepatitis C Screening: does not qualify; Completed no  Vision Screening: Recommended annual ophthalmology exams for early detection of glaucoma and other disorders of the eye. Is the patient up to date with their annual eye exam?  Yes  Who is the provider or what is the name of the office in which the patient attends annual eye exams? Dr.Dingeldein If pt is not established with a provider, would they like to be referred to a provider to establish care? No .   Dental Screening: Recommended annual dental exams for proper oral hygiene   Community Resource Referral / Chronic Care Management: CRR required this visit?  No   CCM required this visit?  No  Plan:     I have personally reviewed and noted the following in the patient's chart:   Medical and social history Use of alcohol, tobacco or illicit drugs  Current medications and supplements including opioid prescriptions. Patient is not currently taking opioid prescriptions. Functional ability and status Nutritional status Physical activity Advanced directives List of other physicians Hospitalizations, surgeries, and ER visits in previous 12 months Vitals Screenings to include cognitive, depression, and falls Referrals and appointments  In addition, I have reviewed and discussed with patient certain preventive protocols, quality metrics, and best practice recommendations. A written  personalized care plan for preventive services as well as general preventive health recommendations were provided to patient.     Hal Hope, LPN   1/61/0960   After Visit Summary: (MyChart) Due to this being a telephonic visit, the after visit summary with patients personalized plan was offered to patient via MyChart   Nurse Notes: none

## 2023-08-26 NOTE — Patient Instructions (Addendum)
Stacey Duncan , Thank you for taking time to come for your Medicare Wellness Visit. I appreciate your ongoing commitment to your health goals. Please review the following plan we discussed and let me know if I can assist you in the future.   Referrals/Orders/Follow-Ups/Clinician Recommendations: none  This is a list of the screening recommended for you and due dates:  Health Maintenance  Topic Date Due   DTaP/Tdap/Td vaccine (1 - Tdap) Never done   Zoster (Shingles) Vaccine (1 of 2) Never done   Pneumonia Vaccine (1 of 1 - PCV) Never done   COVID-19 Vaccine (4 - 2023-24 season) 08/28/2022   Flu Shot  07/29/2023   Medicare Annual Wellness Visit  08/25/2024   DEXA scan (bone density measurement)  Completed   HPV Vaccine  Aged Out    Advanced directives: (ACP Link)Information on Advanced Care Planning can be found at [facility] [place] Advance Health Care Directives Advance Health Care Directives ([email or web address]   Next Medicare Annual Wellness Visit scheduled for next year: Yes    08/31/24 @ 10:45 am by phone

## 2023-08-26 NOTE — Progress Notes (Signed)
  Care Coordination Note  08/26/2023 Name: Stacey Duncan MRN: [id number] DOB: 1931-12-04  Stacey Duncan is a 89 y.o. year old female who is a primary care patient of Smitty Cords, DO and is actively engaged with the care management team. I reached out to [name] by phone today to assist with scheduling a follow up visit with the Licensed Clinical Social Worker  Follow up plan: Pt declines need for f/u at this time, says she has contact numbers for social work and therapy if needed. Appreciative of call   Burman Nieves, [facility] Care Coordination Care Guide Direct Dial: [phone]

## 2023-09-09 ENCOUNTER — Ambulatory Visit: Payer: Self-pay

## 2023-09-09 NOTE — Telephone Encounter (Signed)
Pt is calling in wanting advice on what OTC medications would be good to take to help with side effects from getting the Flu shot and the new Covid Vaccine.  No answer, unable to leave a message.

## 2023-09-09 NOTE — Telephone Encounter (Signed)
     Chief Complaint: Pt. States she is not calling for herself, but her son who had flu and COVID vaccinations.  Symptoms: n/a Frequency: na Pertinent Negatives: Patient denies n/a Disposition: [] ED /[] Urgent Care (no appt availability in office) / [] Appointment(In office/virtual)/ []  [facility]/ [x] Home Care/ [] Refused Recommended Disposition /[] [facility]/ []  Follow-up with PCP Additional Notes: Reviewed home care for when pt. Gets her vaccinations next week.  Reason for Disposition  Injection site reaction to any vaccine  Answer Assessment - Initial Assessment Questions 1. SYMPTOMS: "What is the main symptom?" (e.g., redness, swelling, pain)      N/a 2. ONSET: "When was the vaccine (shot) given?" "How much later did the  begin?" (e.g., hours, days ago)      N/a 3. SEVERITY: "How bad is it?"      N/a 4. FEVER: "Is there a fever?" If Yes, ask: "What is it, how was it measured, and when did it start?"      N/a 5. IMMUNIZATIONS GIVEN: "What shots have you recently received?"     N/a 6. PAST REACTIONS: "Have you reacted to immunizations before?" If Yes, ask: "What happened?"     N/a 7. OTHER SYMPTOMS: "Do you have any other symptoms?"     N/a  Protocols used: Immunization Reactions-A-AH

## 2023-10-26 ENCOUNTER — Ambulatory Visit (INDEPENDENT_AMBULATORY_CARE_PROVIDER_SITE_OTHER): Payer: Medicare PPO | Admitting: Family Medicine

## 2023-10-26 VITALS — BP 110/64 | HR 51 | Ht 62.0 in | Wt 143.0 lb

## 2023-10-26 DIAGNOSIS — M542 Cervicalgia: Secondary | ICD-10-CM

## 2023-10-26 DIAGNOSIS — R5382 Chronic fatigue, unspecified: Secondary | ICD-10-CM

## 2023-10-26 DIAGNOSIS — M797 Fibromyalgia: Secondary | ICD-10-CM | POA: Diagnosis not present

## 2023-10-26 DIAGNOSIS — F5104 Psychophysiologic insomnia: Secondary | ICD-10-CM | POA: Diagnosis not present

## 2023-10-26 DIAGNOSIS — Z23 Encounter for immunization: Secondary | ICD-10-CM

## 2023-10-26 MED ORDER — TIZANIDINE HCL 2 MG PO TABS
2.0000 mg | ORAL_TABLET | Freq: Three times a day (TID) | ORAL | 3 refills | Status: DC | PRN
Start: 2023-10-26 — End: 2024-10-02

## 2023-10-26 NOTE — Patient Instructions (Addendum)
Thank you for coming to the office today.  Right Thumb Pain seems to be related to overactivity, it can be related to arthritis of thumb and hand. Overuse is often the cause.  Recommend anti inflammatory  START anti inflammatory topical - OTC Voltaren (generic Diclofenac) topical 2-4 times a day as needed for pain swelling of affected joint for 1-2 weeks or longer.  Take Tizanidine muscle relaxant as needed for flare up  Recommend Thumb Spica Splint for thumb support, avoid overuse with this thumb caution with grasping and gripping. It needs time to heal  ----------------  Try the Trazodone 50mg , take WHOLE tablet for insomnia and see if this helps you rest and sleep more  Keep up with the therapist as planned if you can continue with them.  I do believe the issue is related to mood and thoughts and trying to solve all of these worries that interfere with your sleep.  Goal to turn off the mind and distract  Please schedule a Follow-up Appointment to: Return if symptoms worsen or fail to improve.  If you have any other questions or concerns, please feel free to call the office or send a message through MyChart. You may also schedule an earlier appointment if necessary.  Additionally, you may be receiving a survey about your experience at our office within a few days to 1 week by e-mail or mail. We value your feedback.  Saralyn Pilar, DO [facility] [place]

## 2023-10-26 NOTE — Progress Notes (Unsigned)
Subjective:    Patient ID: Stacey Duncan, female    DOB: 11/23/1931, 89 y.o.   MRN: [id number]  Stacey Duncan is a 89 y.o. female presenting on 10/26/2023 for Hand Pain (Possibly related to arthritis, has episodic sever pain)   HPI  Right Hand Pain [facility] Joint Pain R hand dominant Right inner thumb palmar aspect with episodic pains She still has hand brace back in 2019 still has it. No longer has Tizanidine needs re order ***  Dull "Head" symptoms daily She has Fibromyalgia, and she was diagnosed before with "Fibro Fog" She describes ***  She goes to bed sleepy. Takes shower before bed. She feels very sleepy and drowsy, and she has difficulty falling asleep.  Getting up will read and get sleepy  She had done well with the sleep medication until recently ***  Now she is taking Trazodone 25mg  (half of 50mg ) nightly ***  ***  Insomnia Sleeping better at night overall now. Still tired during day She did not start Trazodone, it was rx last visit, she was cautious about potential risk of side effect and discontinued this med. Did not try it.   Fluoxetine 40mg  daily, in past failed dose 60mg  and also failed coming off of Fluoxetine too quickly. Also failed Wellbutrin side effects  ***She has been very appreciative talking with [facility] [place], last time 06/2023, ***ask about future scheduling.  ***She has been to counselor in [place] before that was very helpful.    Health Maintenance: ***     10/26/2023    6:12 PM 08/26/2023   11:03 AM 06/23/2023   10:56 AM  Depression screen PHQ 2/9  Decreased Interest 1 1 1   Down, Depressed, Hopeless 2 1 0  PHQ - 2 Score 3 2 1   Altered sleeping 3 1 1   Tired, decreased energy 2 1 2   Change in appetite 0  1  Feeling bad or failure about yourself  0  0  Trouble concentrating 1  0  Moving slowly or fidgety/restless 0  0  Suicidal thoughts 0  0  PHQ-9 Score 9 4 5   Difficult doing work/chores Somewhat difficult  Not difficult at all     Social History   Tobacco Use   Smoking status: Never   Smokeless tobacco: Never  Vaping Use   Vaping status: Never Used  Substance Use Topics   Alcohol use: Yes    Alcohol/week: 7.0 standard drinks of alcohol    Types: 7 Glasses of wine per week    Comment: glass of wine daily   Drug use: No    Review of Systems Per HPI unless specifically indicated above     Objective:    BP 110/64   Pulse (!) 51   Ht 5\' 2"  (1.575 m)   Wt 143 lb (64.9 kg)   SpO2 98%   BMI 26.16 kg/m   Wt Readings from Last 3 Encounters:  10/26/23 143 lb (64.9 kg)  06/23/23 141 lb 3.2 oz (64 kg)  05/04/23 140 lb (63.5 kg)    Physical Exam   Results for orders placed or performed in visit on 03/12/23  Vitamin B12  Result Value Ref Range   Vitamin B-12 294 200 - 1,100 pg/mL  T4, free  Result Value Ref Range   Free T4 1.3 0.8 - 1.8 ng/dL  TSH  Result Value Ref Range   TSH 0.32 (L) 0.40 - 4.50 mIU/L  Hemoglobin A1c  Result Value Ref Range   Hgb A1c  MFr Bld 5.9 (H) <5.7 % of total Hgb   Mean Plasma Glucose 123 mg/dL   eAG (mmol/L) 6.8 mmol/L  Lipid panel  Result Value Ref Range   Cholesterol 139 <200 mg/dL   HDL 48 (L) > OR = 50 mg/dL   Triglycerides 161 <096 mg/dL   LDL Cholesterol (Calc) 69 mg/dL (calc)   Total CHOL/HDL Ratio 2.9 <5.0 (calc)   Non-HDL Cholesterol (Calc) 91 <045 mg/dL (calc)  CBC with Differential/Platelet  Result Value Ref Range   WBC 8.3 3.8 - 10.8 Thousand/uL   RBC 4.25 3.80 - 5.10 Million/uL   Hemoglobin 12.9 11.7 - 15.5 g/dL   HCT 40.9 81.1 - 91.4 %   MCV 92.0 80.0 - 100.0 fL   MCH 30.4 27.0 - 33.0 pg   MCHC 33.0 32.0 - 36.0 g/dL   RDW 78.2 95.6 - 21.3 %   Platelets 300 140 - 400 Thousand/uL   MPV 10.0 7.5 - 12.5 fL   Neutro Abs 4,208 1,500 - 7,800 cells/uL   Lymphs Abs 3,212 850 - 3,900 cells/uL   Absolute Monocytes 540 200 - 950 cells/uL   Eosinophils Absolute 274 15 - 500 cells/uL   Basophils Absolute 66 0 - 200 cells/uL   Neutrophils Relative %  50.7 %   Total Lymphocyte 38.7 %   Monocytes Relative 6.5 %   Eosinophils Relative 3.3 %   Basophils Relative 0.8 %  COMPLETE METABOLIC PANEL WITH GFR  Result Value Ref Range   Glucose, Bld 93 65 - 99 mg/dL   BUN 9 7 - 25 mg/dL   Creat 0.86 5.78 - 4.69 mg/dL   eGFR 70 > OR = 60 GE/XBM/8.41L2   BUN/Creatinine Ratio SEE NOTE: 6 - 22 (calc)   Sodium 140 135 - 146 mmol/L   Potassium 4.4 3.5 - 5.3 mmol/L   Chloride 104 98 - 110 mmol/L   CO2 29 20 - 32 mmol/L   Calcium 9.1 8.6 - 10.4 mg/dL   Total Protein 6.4 6.1 - 8.1 g/dL   Albumin 3.9 3.6 - 5.1 g/dL   Globulin 2.5 1.9 - 3.7 g/dL (calc)   AG Ratio 1.6 1.0 - 2.5 (calc)   Total Bilirubin 0.6 0.2 - 1.2 mg/dL   Alkaline phosphatase (APISO) 75 37 - 153 U/L   AST 16 10 - 35 U/L   ALT 13 6 - 29 U/L      Assessment & Plan:   Problem List Items Addressed This Visit     Chronic fatigue   Fibromyalgia   Relevant Medications   tiZANidine (ZANAFLEX) 2 MG tablet   Other Visit Diagnoses     Psychophysiologic insomnia    -  Primary   Need for influenza vaccination       Relevant Orders   Flu Vaccine Trivalent High Dose (Fluad) (Completed)   Neck pain       Relevant Medications   tiZANidine (ZANAFLEX) 2 MG tablet       Meds ordered this encounter  Medications   tiZANidine (ZANAFLEX) 2 MG tablet    Sig: Take 1 tablet (2 mg total) by mouth every 8 (eight) hours as needed for muscle spasms.    Dispense:  30 tablet    Refill:  3      Follow up plan: Return if symptoms worsen or fail to improve.  Future labs ordered for ***   Saralyn Pilar, DO [facility] 10/26/2023, 3:47 PM

## 2023-10-27 ENCOUNTER — Encounter: Payer: Self-pay | Admitting: Family Medicine

## 2023-10-28 ENCOUNTER — Other Ambulatory Visit: Payer: Self-pay

## 2023-10-28 ENCOUNTER — Other Ambulatory Visit: Payer: Self-pay | Admitting: *Deleted

## 2023-10-28 NOTE — Patient Instructions (Signed)
Visit Information  Thank you for taking time to visit with me today. Please don't hesitate to contact me if I can be of assistance to you before our next scheduled telephone appointment.  Following are the goals we discussed today:   Goals Addressed             This Visit's Progress    RNCM Care Management Expected Outcome:  Monitor, Self-Manage and Reduce Symptoms of Afib       Current Barriers:  Chronic Disease Management support and education needs related to effective management of AFIB BP Readings from Last 3 Encounters:  10/26/23 110/64  06/23/23 118/70  05/04/23 122/60     Planned Interventions: Provider order and care plan reviewed. Collaborated with PharmD regarding patient care and plan. AFIB is stable. The patients blood pressures are more stable. She is still having fatigue but thinks the Vitamin B12 shots are helping her. The patient did have questions about the Vitamin B12 shots if she would continue to get them or what the plan would be. A secure message sent to the pcp asking for recommendations on if she needs more Vitamin B shots or if she needs to have blood work first. The patient verbalized understanding.  Counseled on increased risk of stroke due to Afib and benefits of anticoagulation for stroke prevention           Reviewed importance of adherence to anticoagulant exactly as prescribed. The patient is compliant with her medications. Denies any new concerns related to medication administration.  Advised patient to discuss changes in AFIB, questions, or concerns with provider Counseled on bleeding risk associated with AFIB and importance of self-monitoring for signs/symptoms of bleeding Counseled on avoidance of NSAIDs due to increased bleeding risk with anticoagulants Counseled on importance of regular laboratory monitoring as prescribed. Has labs on a regular basis.  Counseled on seeking medical attention after a head injury or if there is blood in the  urine/stool Afib action plan reviewed Screening for signs and symptoms of depression related to chronic disease state Assessed social determinant of health barriers  Symptom Management: Take medications as prescribed   Attend all scheduled provider appointments Call provider office for new concerns or questions  call the Suicide and Crisis Lifeline: 988 call the [place] National Suicide Prevention Lifeline: 450 625 7976 or TTY: 519-252-8514 TTY 808 443 4595) to talk to a trained counselor call 1-800-273-TALK (toll free, 24 hour hotline) if experiencing a Mental Health or Behavioral Health Crisis  - make a plan to eat healthy - keep all lab appointments - take medicine as prescribed  Follow Up Plan: Telephone follow up appointment with care management team member scheduled for: 11-04-2023 at 11:45 am       RNCM Care Management Expected Outcome:  Monitor, Self-Manage and Reduce Symptoms of: depression, anxiety, stress       Current Barriers:  Knowledge Deficits related to resources to help with questions she has and help for her adult son who lives with her and her husband that is 68 and is bipolar  Care Coordination needs related to social work support and resources to help with managing her depression, anxiety, and stress and get help with needs and concerns related to her adult son in a patient with depression, anxiety, and stress Chronic Disease Management support and education needs related to effective management of depression, anxiety, and stress  Planned Interventions: Evaluation of current treatment plan related to depression, anxiety, and stress and patient's adherence to plan as established by provider. The  patient has good days and bad days with her depression, anxiety, and stress levels. She saw a therapist in [place] last week and really liked him. She said he was an older gentleman and he was very helpful. She is going to work with him. She says that she has to find ways of  coping because the things she gets so stressed about are out of her control. Her daughter is coming next week from [place] for a week and is looking forward to t his. Her daughter will help her with some lose ends that need to be taken care of. She says that her husband is unpredictable with his anger and has good days and bad days. Reflective listening and support give to the patient. Will let the patient know the recommendations of the pcp. Advised patient to call the office for changes in her mood, anxiety, depression, stress level, or other mental health needs. Provided education to patient re: mindfulness, journal writing, and making sure she practices self care and getting what she needs to manage her mental health and well being. The patient is feeling better and thankful that she has support and a therapist now. She is happy her daughter is coming to help with things. She talked a lot about self care and making sure she is doing what she needs to do for her health and well being. Reflective listening and support given Collaborated with pcp and office CMA regarding Vitamin B12 injections. Is getting Vit B12 injections without difficulty. Feels these are helping her a lot.  Provided patient with bipolar  educational materials related to the patient wanting information on bipolar to help her understand her sons condition as well as possible Reviewed scheduled/upcoming provider appointments including: Saw pcp in June and had a Vitamin B12 injection on 06-28-2023 Social Work referral for assistance for resources for her adult son who is bipolar 1 and also for effective management of her mental health and well being. Ongoing support from the LCSW for support and education Discussed plans with patient for ongoing care management follow up and provided patient with direct contact information for care management team Advised patient to discuss changes in her mood, anxiety, stress, depression or mental health  with provider Screening for signs and symptoms of depression related to chronic disease state  Assessed social determinant of health barriers  Symptom Management: Take medications as prescribed   Attend all scheduled provider appointments Call provider office for new concerns or questions  call the Suicide and Crisis Lifeline: 988 call the [place] National Suicide Prevention Lifeline: 929-687-9110 or TTY: (907) 609-5460 TTY (817)462-6572) to talk to a trained counselor call 1-800-273-TALK (toll free, 24 hour hotline) if experiencing a Mental Health or Behavioral Health Crisis   Follow Up Plan: Telephone follow up appointment with care management team member scheduled for:  11-04-2023 at 11:45 am       [facility] Care Management Expected Outcome:  Monitor, Self-Manage, and Reduce Symptoms of Hypertension       Current Barriers:  Care Coordination needs related to changes in her blood pressures and sx and sx that she is having that may be related to blood pressures or other chronic conditions  in a patient with HTN and other heart conditions Chronic Disease Management support and education needs related to effective management of HTN  BP Readings from Last 3 Encounters:  10/26/23 110/64  06/23/23 118/70  05/04/23 122/60     Planned Interventions: Evaluation of current treatment plan related to hypertension self management  and patient's adherence to plan as established by provider. The patient saw her cardiologist last week and one of her medications was put on hold.  The patient is having a good day today. Blood pressures are better and she is not having any issues with it dropping like it was. Knows to change position slowly and report any new concerns, questions, or issues.  Provided education to patient re: stroke prevention, s/s of heart attack and stroke; Reviewed prescribed diet heart healthy diet. Review and education done.  Reviewed medications with patient and discussed importance of  compliance. The patient is complaint with medications. Denies any new concerns related to medications. ;  Discussed plans with patient for ongoing care management follow up and provided patient with direct contact information for care management team; Advised patient, providing education and rationale, to monitor blood pressure daily and record, calling PCP for findings outside established parameters;  Reviewed scheduled/upcoming provider appointments including:  Saw the pcp on 06-18-2023, no upcoming appointments  Advised patient to discuss changes in her blood pressures, questions, or concerns  with provider; Provided education on prescribed diet heart healthy diet. Is losing weight and discussed monitoring dietary habits, making sure she is staying hydrated;  Discussed complications of poorly controlled blood pressure such as heart disease, stroke, circulatory complications, vision complications, kidney impairment, sexual dysfunction;  Screening for signs and symptoms of depression related to chronic disease state;  Assessed social determinant of health barriers;   Symptom Management: Take medications as prescribed   Attend all scheduled provider appointments Call provider office for new concerns or questions  call the Suicide and Crisis Lifeline: 988 call the [place] National Suicide Prevention Lifeline: (732) 797-0902 or TTY: (863) 589-3408 TTY (860) 632-5597) to talk to a trained counselor call 1-800-273-TALK (toll free, 24 hour hotline) if experiencing a Mental Health or Behavioral Health Crisis  learn about high blood pressure take blood pressure log to all doctor appointments call doctor for signs and symptoms of high blood pressure develop an action plan for high blood pressure keep all doctor appointments take medications for blood pressure exactly as prescribed report new symptoms to your doctor  Follow Up Plan: Telephone follow up appointment with care management team member scheduled  for: 11-04-2023 at 11:45 am           Our next appointment is by telephone on 11-04-2023 at 11:45 am  Please call the care guide team at [phone] if you need to cancel or reschedule your appointment.   If you are experiencing a Mental Health or Behavioral Health Crisis or need someone to talk to, please call the Suicide and Crisis Lifeline: 988 call the [place] National Suicide Prevention Lifeline: 7250484386 or TTY: (619)479-0307 TTY (319)868-9154) to talk to a trained counselor call 1-800-273-TALK (toll free, 24 hour hotline) call 911   The patient verbalized understanding of instructions, educational materials, and care plan provided today and DECLINED offer to receive copy of patient instructions, educational materials, and care plan.   Telephone follow up appointment with care management team member scheduled for: 11-04-2023 at 11:45 am  Danise Edge, BSN RN RN Care Manager  [facility]  Ambulatory Care Management  Direct Number: [phone]

## 2023-10-28 NOTE — Patient Outreach (Signed)
Care Management   Visit Note  10/28/2023 Name: Stacey Duncan MRN: [id number] DOB: 1931-04-01  Subjective: Stacey Duncan is a 89 y.o. year old female who is a primary care patient of Smitty Cords, DO. The Care Management team was consulted for assistance.      Engaged with patient spoke with patient by telephone.    Goals Addressed             This Visit's Progress    RNCM Care Management Expected Outcome:  Monitor, Self-Manage and Reduce Symptoms of Afib       Current Barriers:  Chronic Disease Management support and education needs related to effective management of AFIB BP Readings from Last 3 Encounters:  10/26/23 110/64  06/23/23 118/70  05/04/23 122/60     Planned Interventions: Provider order and care plan reviewed. Collaborated with PharmD regarding patient care and plan. AFIB is stable. The patients blood pressures are more stable. She is still having fatigue but thinks the Vitamin B12 shots are helping her. The patient did have questions about the Vitamin B12 shots if she would continue to get them or what the plan would be. A secure message sent to the pcp asking for recommendations on if she needs more Vitamin B shots or if she needs to have blood work first. The patient verbalized understanding.  Counseled on increased risk of stroke due to Afib and benefits of anticoagulation for stroke prevention           Reviewed importance of adherence to anticoagulant exactly as prescribed. The patient is compliant with her medications. Denies any new concerns related to medication administration.  Advised patient to discuss changes in AFIB, questions, or concerns with provider Counseled on bleeding risk associated with AFIB and importance of self-monitoring for signs/symptoms of bleeding Counseled on avoidance of NSAIDs due to increased bleeding risk with anticoagulants Counseled on importance of regular laboratory monitoring as prescribed. Has labs on a  regular basis.  Counseled on seeking medical attention after a head injury or if there is blood in the urine/stool Afib action plan reviewed Screening for signs and symptoms of depression related to chronic disease state Assessed social determinant of health barriers  Symptom Management: Take medications as prescribed   Attend all scheduled provider appointments Call provider office for new concerns or questions  call the Suicide and Crisis Lifeline: 988 call the [place] National Suicide Prevention Lifeline: (854)193-5806 or TTY: 984 089 2387 TTY (951)011-8736) to talk to a trained counselor call 1-800-273-TALK (toll free, 24 hour hotline) if experiencing a Mental Health or Behavioral Health Crisis  - make a plan to eat healthy - keep all lab appointments - take medicine as prescribed  Follow Up Plan: Telephone follow up appointment with care management team member scheduled for: 11-04-2023 at 11:45 am       RNCM Care Management Expected Outcome:  Monitor, Self-Manage and Reduce Symptoms of: depression, anxiety, stress       Current Barriers:  Knowledge Deficits related to resources to help with questions she has and help for her adult son who lives with her and her husband that is 20 and is bipolar  Care Coordination needs related to social work support and resources to help with managing her depression, anxiety, and stress and get help with needs and concerns related to her adult son in a patient with depression, anxiety, and stress Chronic Disease Management support and education needs related to effective management of depression, anxiety, and stress  Planned Interventions: Evaluation  of current treatment plan related to depression, anxiety, and stress and patient's adherence to plan as established by provider. The patient has good days and bad days with her depression, anxiety, and stress levels. She saw a therapist in [place] last week and really liked him. She said he was an older  gentleman and he was very helpful. She is going to work with him. She says that she has to find ways of coping because the things she gets so stressed about are out of her control. Her daughter is coming next week from [place] for a week and is looking forward to t his. Her daughter will help her with some lose ends that need to be taken care of. She says that her husband is unpredictable with his anger and has good days and bad days. Reflective listening and support give to the patient. Will let the patient know the recommendations of the pcp. Advised patient to call the office for changes in her mood, anxiety, depression, stress level, or other mental health needs. Provided education to patient re: mindfulness, journal writing, and making sure she practices self care and getting what she needs to manage her mental health and well being. The patient is feeling better and thankful that she has support and a therapist now. She is happy her daughter is coming to help with things. She talked a lot about self care and making sure she is doing what she needs to do for her health and well being. Reflective listening and support given Collaborated with pcp and office CMA regarding Vitamin B12 injections. Is getting Vit B12 injections without difficulty. Feels these are helping her a lot.  Provided patient with bipolar  educational materials related to the patient wanting information on bipolar to help her understand her sons condition as well as possible Reviewed scheduled/upcoming provider appointments including: Saw pcp in June and had a Vitamin B12 injection on 06-28-2023 Social Work referral for assistance for resources for her adult son who is bipolar 1 and also for effective management of her mental health and well being. Ongoing support from the LCSW for support and education Discussed plans with patient for ongoing care management follow up and provided patient with direct contact information for care  management team Advised patient to discuss changes in her mood, anxiety, stress, depression or mental health with provider Screening for signs and symptoms of depression related to chronic disease state  Assessed social determinant of health barriers  Symptom Management: Take medications as prescribed   Attend all scheduled provider appointments Call provider office for new concerns or questions  call the Suicide and Crisis Lifeline: 988 call the [place] National Suicide Prevention Lifeline: 971-465-2884 or TTY: (581)369-6321 TTY 443-043-8935) to talk to a trained counselor call 1-800-273-TALK (toll free, 24 hour hotline) if experiencing a Mental Health or Behavioral Health Crisis   Follow Up Plan: Telephone follow up appointment with care management team member scheduled for:  11-04-2023 at 11:45 am       [facility] Care Management Expected Outcome:  Monitor, Self-Manage, and Reduce Symptoms of Hypertension       Current Barriers:  Care Coordination needs related to changes in her blood pressures and sx and sx that she is having that may be related to blood pressures or other chronic conditions  in a patient with HTN and other heart conditions Chronic Disease Management support and education needs related to effective management of HTN  BP Readings from Last 3 Encounters:  10/26/23 110/64  06/23/23  118/70  05/04/23 122/60     Planned Interventions: Evaluation of current treatment plan related to hypertension self management and patient's adherence to plan as established by provider. The patient saw her cardiologist last week and one of her medications was put on hold.  The patient is having a good day today. Blood pressures are better and she is not having any issues with it dropping like it was. Knows to change position slowly and report any new concerns, questions, or issues.  Provided education to patient re: stroke prevention, s/s of heart attack and stroke; Reviewed prescribed diet heart  healthy diet. Review and education done.  Reviewed medications with patient and discussed importance of compliance. The patient is complaint with medications. Denies any new concerns related to medications. ;  Discussed plans with patient for ongoing care management follow up and provided patient with direct contact information for care management team; Advised patient, providing education and rationale, to monitor blood pressure daily and record, calling PCP for findings outside established parameters;  Reviewed scheduled/upcoming provider appointments including:  Saw the pcp on 06-18-2023, no upcoming appointments  Advised patient to discuss changes in her blood pressures, questions, or concerns  with provider; Provided education on prescribed diet heart healthy diet. Is losing weight and discussed monitoring dietary habits, making sure she is staying hydrated;  Discussed complications of poorly controlled blood pressure such as heart disease, stroke, circulatory complications, vision complications, kidney impairment, sexual dysfunction;  Screening for signs and symptoms of depression related to chronic disease state;  Assessed social determinant of health barriers;   Symptom Management: Take medications as prescribed   Attend all scheduled provider appointments Call provider office for new concerns or questions  call the Suicide and Crisis Lifeline: 988 call the [place] National Suicide Prevention Lifeline: 717-156-7071 or TTY: 7268320302 TTY 971-016-2463) to talk to a trained counselor call 1-800-273-TALK (toll free, 24 hour hotline) if experiencing a Mental Health or Behavioral Health Crisis  learn about high blood pressure take blood pressure log to all doctor appointments call doctor for signs and symptoms of high blood pressure develop an action plan for high blood pressure keep all doctor appointments take medications for blood pressure exactly as prescribed report new symptoms to  your doctor  Follow Up Plan: Telephone follow up appointment with care management team member scheduled for: 11-04-2023 at 11:45 am              Consent to Services:  Patient was given information about care management services, agreed to services, and gave verbal consent to participate.   Plan: Telephone follow up appointment with care management team member scheduled for: 11-04-2023 at 11:45 am Danise Edge, BSN RN RN Care Manager  [facility]  Ambulatory Care Management  Direct Number: [phone]

## 2023-11-04 ENCOUNTER — Other Ambulatory Visit: Payer: Self-pay

## 2023-11-04 ENCOUNTER — Other Ambulatory Visit: Payer: Self-pay | Admitting: *Deleted

## 2023-11-04 NOTE — Patient Instructions (Signed)
Visit Information  Thank you for taking time to visit with me today. Please don't hesitate to contact me if I can be of assistance to you before our next scheduled telephone appointment.  Following are the goals we discussed today:   Goals Addressed             This Visit's Progress    RNCM Care Management Expected Outcome:  Monitor, Self-Manage and Reduce Symptoms of Afib       Current Barriers:  Chronic Disease Management support and education needs related to effective management of AFIB BP Readings from Last 3 Encounters:  10/26/23 110/64  06/23/23 118/70  05/04/23 122/60     Planned Interventions: Provider order and care plan reviewed. AFIB is stable. The patients blood pressures are more stable. She is still having fatigue but thinks the Vitamin B12 shots are helping her.  Counseled on increased risk of stroke due to Afib and benefits of anticoagulation for stroke prevention           Reviewed importance of adherence to anticoagulant exactly as prescribed. The patient is compliant with her medications. Denies any new concerns related to medication administration.  Advised patient to discuss changes in AFIB, questions, or concerns with provider Counseled on bleeding risk associated with AFIB and importance of self-monitoring for signs/symptoms of bleeding Counseled on avoidance of NSAIDs due to increased bleeding risk with anticoagulants Counseled on importance of regular laboratory monitoring as prescribed. Has labs on a regular basis.  Counseled on seeking medical attention after a head injury or if there is blood in the urine/stool Afib action plan reviewed Screening for signs and symptoms of depression related to chronic disease state Assessed social determinant of health barriers  Symptom Management: Take medications as prescribed   Attend all scheduled provider appointments Call provider office for new concerns or questions  call the Suicide and Crisis Lifeline:  988 call the [place] National Suicide Prevention Lifeline: 3098631560 or TTY: 772-279-0898 TTY [phone]) to talk to a trained counselor call 1-800-273-TALK (toll free, 24 hour hotline) if experiencing a Mental Health or Behavioral Health Crisis  - make a plan to eat healthy - keep all lab appointments - take medicine as prescribed  Follow Up Plan: Telephone follow up appointment with care management team member scheduled for: 12-20-2023 at 11:45 am       RNCM Care Management Expected Outcome:  Monitor, Self-Manage and Reduce Symptoms of: depression, anxiety, stress       Current Barriers:  Knowledge Deficits related to resources to help with questions she has and help for her adult son who lives with her and her husband that is 51 and is bipolar  Care Coordination needs related to social work support and resources to help with managing her depression, anxiety, and stress and get help with needs and concerns related to her adult son in a patient with depression, anxiety, and stress Chronic Disease Management support and education needs related to effective management of depression, anxiety, and stress  Planned Interventions: Evaluation of current treatment plan related to depression, anxiety, and stress and patient's adherence to plan as established by provider. Follows with therapist in [place]. She says that she has to find ways of coping because the things she gets so stressed about are out of her control. Reflective listening and support give to the patient. Advised patient to call the office for changes in her mood, anxiety, depression, stress level, or other mental health needs. Provided education to patient re: mindfulness, journal  writing, and making sure she practices self care and getting what she needs to manage her mental health and well being. The patient is feeling better and thankful that she has support and a therapist now.  Reflective listening and support given Collaborated  with pcp and office CMA regarding Vitamin B12 injections. Is getting Vit B12 injections without difficulty. Feels these are helping her a lot.  Provided patient with bipolar  educational materials related to the patient wanting information on bipolar to help her understand her sons condition as well as possible. Patient states she is waiting on disability determination for her son who also does not have insurance at this time. She expresses that she does not have money for specialist because her son needs to be seen for bursitis. She is inquiring if her pcp would be willing to see patient on a payment plan. Reviewed scheduled/upcoming provider appointments including: Social Work referral for assistance for resources for her adult son who is bipolar 1 and also for effective management of her mental health and well being. Ongoing support from the LCSW for support and education Discussed plans with patient for ongoing care management follow up and provided patient with direct contact information for care management team Advised patient to discuss changes in her mood, anxiety, stress, depression or mental health with provider Screening for signs and symptoms of depression related to chronic disease state  Assessed social determinant of health barriers   Symptom Management: Take medications as prescribed   Attend all scheduled provider appointments Call provider office for new concerns or questions  call the Suicide and Crisis Lifeline: 988 call the [place] National Suicide Prevention Lifeline: 5130466497 or TTY: 973-201-2878 TTY [phone]) to talk to a trained counselor call 1-800-273-TALK (toll free, 24 hour hotline) if experiencing a Mental Health or Behavioral Health Crisis   Follow Up Plan: Telephone follow up appointment with care management team member scheduled for:  12-20-2023 at 11:45 am       [facility] Care Management Expected Outcome:  Monitor, Self-Manage, and Reduce Symptoms of  Hypertension       Current Barriers:  Care Coordination needs related to changes in her blood pressures and sx and sx that she is having that may be related to blood pressures or other chronic conditions  in a patient with HTN and other heart conditions Chronic Disease Management support and education needs related to effective management of HTN  BP Readings from Last 3 Encounters:  10/26/23 110/64  06/23/23 118/70  05/04/23 122/60     Planned Interventions: Evaluation of current treatment plan related to hypertension self management and patient's adherence to plan as established by provider.   The patient is having a good day today. Blood pressures are better and she is not having any issues with it dropping like it was. Knows to change position slowly and report any new concerns, questions, or issues.  Provided education to patient re: stroke prevention, s/s of heart attack and stroke; Reviewed prescribed diet heart healthy diet. Review and education done.  Reviewed medications with patient and discussed importance of compliance. The patient is complaint with medications. Denies any new concerns related to medications. ;  Discussed plans with patient for ongoing care management follow up and provided patient with direct contact information for care management team; Advised patient, providing education and rationale, to monitor blood pressure daily and record, calling PCP for findings outside established parameters;  Reviewed scheduled/upcoming provider appointments including:  Advised patient to discuss changes in her blood  pressures, questions, or concerns  with provider; Provided education on prescribed diet heart healthy diet. Is losing weight and discussed monitoring dietary habits, making sure she is staying hydrated;  Discussed complications of poorly controlled blood pressure such as heart disease, stroke, circulatory complications, vision complications, kidney impairment, sexual  dysfunction;  Screening for signs and symptoms of depression related to chronic disease state;  Assessed social determinant of health barriers;   Symptom Management: Take medications as prescribed   Attend all scheduled provider appointments Call provider office for new concerns or questions  call the Suicide and Crisis Lifeline: 988 call the [place] National Suicide Prevention Lifeline: 423-416-6519 or TTY: 5183728273 TTY (225) 256-3163) to talk to a trained counselor call 1-800-273-TALK (toll free, 24 hour hotline) if experiencing a Mental Health or Behavioral Health Crisis  learn about high blood pressure take blood pressure log to all doctor appointments call doctor for signs and symptoms of high blood pressure develop an action plan for high blood pressure keep all doctor appointments take medications for blood pressure exactly as prescribed report new symptoms to your doctor  Follow Up Plan: Telephone follow up appointment with care management team member scheduled for: 12-20-2023 at 11:45 am           Our next appointment is by telephone on 12-20-2023 at 11:45 am  Please call the care guide team at [phone] if you need to cancel or reschedule your appointment.   If you are experiencing a Mental Health or Behavioral Health Crisis or need someone to talk to, please call the Suicide and Crisis Lifeline: 988 call the [place] National Suicide Prevention Lifeline: (725)695-7516 or TTY: 825-200-4780 TTY 419-568-3141) to talk to a trained counselor call 1-800-273-TALK (toll free, 24 hour hotline) call 911   The patient verbalized understanding of instructions, educational materials, and care plan provided today and DECLINED offer to receive copy of patient instructions, educational materials, and care plan.   Telephone follow up appointment with care management team member scheduled for: 12-20-2023 at 11:45 am  Danise Edge, BSN RN RN Care Manager  [facility]   Ambulatory Care Management  Direct Number: [phone]

## 2023-11-04 NOTE — Patient Outreach (Signed)
Care Management   Visit Note  11/04/2023 Name: Stacey Duncan MRN: [id number] DOB: Apr 15, 1931  Subjective: Stacey Duncan is a 89 y.o. year old female who is a primary care patient of Smitty Cords, DO. The Care Management team was consulted for assistance.      Engaged with patient spoke with patient by telephone.    Goals Addressed             This Visit's Progress    RNCM Care Management Expected Outcome:  Monitor, Self-Manage and Reduce Symptoms of Afib       Current Barriers:  Chronic Disease Management support and education needs related to effective management of AFIB BP Readings from Last 3 Encounters:  10/26/23 110/64  06/23/23 118/70  05/04/23 122/60     Planned Interventions: Provider order and care plan reviewed. AFIB is stable. The patients blood pressures are more stable. She is still having fatigue but thinks the Vitamin B12 shots are helping her.  Counseled on increased risk of stroke due to Afib and benefits of anticoagulation for stroke prevention           Reviewed importance of adherence to anticoagulant exactly as prescribed. The patient is compliant with her medications. Denies any new concerns related to medication administration.  Advised patient to discuss changes in AFIB, questions, or concerns with provider Counseled on bleeding risk associated with AFIB and importance of self-monitoring for signs/symptoms of bleeding Counseled on avoidance of NSAIDs due to increased bleeding risk with anticoagulants Counseled on importance of regular laboratory monitoring as prescribed. Has labs on a regular basis.  Counseled on seeking medical attention after a head injury or if there is blood in the urine/stool Afib action plan reviewed Screening for signs and symptoms of depression related to chronic disease state Assessed social determinant of health barriers  Symptom Management: Take medications as prescribed   Attend all scheduled provider  appointments Call provider office for new concerns or questions  call the Suicide and Crisis Lifeline: 988 call the [place] National Suicide Prevention Lifeline: 410-622-3343 or TTY: (513) 691-2130 TTY [phone]) to talk to a trained counselor call 1-800-273-TALK (toll free, 24 hour hotline) if experiencing a Mental Health or Behavioral Health Crisis  - make a plan to eat healthy - keep all lab appointments - take medicine as prescribed  Follow Up Plan: Telephone follow up appointment with care management team member scheduled for: 12-20-2023 at 11:45 am       RNCM Care Management Expected Outcome:  Monitor, Self-Manage and Reduce Symptoms of: depression, anxiety, stress       Current Barriers:  Knowledge Deficits related to resources to help with questions she has and help for her adult son who lives with her and her husband that is 97 and is bipolar  Care Coordination needs related to social work support and resources to help with managing her depression, anxiety, and stress and get help with needs and concerns related to her adult son in a patient with depression, anxiety, and stress Chronic Disease Management support and education needs related to effective management of depression, anxiety, and stress  Planned Interventions: Evaluation of current treatment plan related to depression, anxiety, and stress and patient's adherence to plan as established by provider. Follows with therapist in [place]. She says that she has to find ways of coping because the things she gets so stressed about are out of her control. Reflective listening and support give to the patient. Advised patient to call the office for  changes in her mood, anxiety, depression, stress level, or other mental health needs. Provided education to patient re: mindfulness, journal writing, and making sure she practices self care and getting what she needs to manage her mental health and well being. The patient is feeling better and  thankful that she has support and a therapist now.  Reflective listening and support given Collaborated with pcp and office CMA regarding Vitamin B12 injections. Is getting Vit B12 injections without difficulty. Feels these are helping her a lot.  Provided patient with bipolar  educational materials related to the patient wanting information on bipolar to help her understand her sons condition as well as possible. Patient states she is waiting on disability determination for her son who also does not have insurance at this time. She expresses that she does not have money for specialist because her son needs to be seen for bursitis. She is inquiring if her pcp would be willing to see patient on a payment plan. Reviewed scheduled/upcoming provider appointments including: Social Work referral for assistance for resources for her adult son who is bipolar 1 and also for effective management of her mental health and well being. Ongoing support from the LCSW for support and education Discussed plans with patient for ongoing care management follow up and provided patient with direct contact information for care management team Advised patient to discuss changes in her mood, anxiety, stress, depression or mental health with provider Screening for signs and symptoms of depression related to chronic disease state  Assessed social determinant of health barriers   Symptom Management: Take medications as prescribed   Attend all scheduled provider appointments Call provider office for new concerns or questions  call the Suicide and Crisis Lifeline: 988 call the [place] National Suicide Prevention Lifeline: 234-545-8136 or TTY: [phone] TTY 859 140 1399) to talk to a trained counselor call 1-800-273-TALK (toll free, 24 hour hotline) if experiencing a Mental Health or Behavioral Health Crisis   Follow Up Plan: Telephone follow up appointment with care management team member scheduled for:  12-20-2023 at 11:45  am       [facility] Care Management Expected Outcome:  Monitor, Self-Manage, and Reduce Symptoms of Hypertension       Current Barriers:  Care Coordination needs related to changes in her blood pressures and sx and sx that she is having that may be related to blood pressures or other chronic conditions  in a patient with HTN and other heart conditions Chronic Disease Management support and education needs related to effective management of HTN  BP Readings from Last 3 Encounters:  10/26/23 110/64  06/23/23 118/70  05/04/23 122/60     Planned Interventions: Evaluation of current treatment plan related to hypertension self management and patient's adherence to plan as established by provider.   The patient is having a good day today. Blood pressures are better and she is not having any issues with it dropping like it was. Knows to change position slowly and report any new concerns, questions, or issues.  Provided education to patient re: stroke prevention, s/s of heart attack and stroke; Reviewed prescribed diet heart healthy diet. Review and education done.  Reviewed medications with patient and discussed importance of compliance. The patient is complaint with medications. Denies any new concerns related to medications. ;  Discussed plans with patient for ongoing care management follow up and provided patient with direct contact information for care management team; Advised patient, providing education and rationale, to monitor blood pressure daily and record, calling PCP  for findings outside established parameters;  Reviewed scheduled/upcoming provider appointments including:  Advised patient to discuss changes in her blood pressures, questions, or concerns  with provider; Provided education on prescribed diet heart healthy diet. Is losing weight and discussed monitoring dietary habits, making sure she is staying hydrated;  Discussed complications of poorly controlled blood pressure such as heart  disease, stroke, circulatory complications, vision complications, kidney impairment, sexual dysfunction;  Screening for signs and symptoms of depression related to chronic disease state;  Assessed social determinant of health barriers;   Symptom Management: Take medications as prescribed   Attend all scheduled provider appointments Call provider office for new concerns or questions  call the Suicide and Crisis Lifeline: 988 call the [place] National Suicide Prevention Lifeline: (774) 731-4784 or TTY: 872-133-7124 TTY 769-655-6526) to talk to a trained counselor call 1-800-273-TALK (toll free, 24 hour hotline) if experiencing a Mental Health or Behavioral Health Crisis  learn about high blood pressure take blood pressure log to all doctor appointments call doctor for signs and symptoms of high blood pressure develop an action plan for high blood pressure keep all doctor appointments take medications for blood pressure exactly as prescribed report new symptoms to your doctor  Follow Up Plan: Telephone follow up appointment with care management team member scheduled for: 12-20-2023 at 11:45 am            Consent to Services:  Patient was given information about care management services, agreed to services, and gave verbal consent to participate.   Plan: Telephone follow up appointment with care management team member scheduled for: 12-20-2023 at 11:45 am  Danise Edge, BSN RN RN Care Manager  [facility]  Ambulatory Care Management  Direct Number: [phone]

## 2023-11-07 ENCOUNTER — Other Ambulatory Visit: Payer: Self-pay | Admitting: Cardiovascular Disease

## 2023-11-07 DIAGNOSIS — I4891 Unspecified atrial fibrillation: Secondary | ICD-10-CM

## 2023-11-17 ENCOUNTER — Other Ambulatory Visit: Payer: Self-pay | Admitting: Family Medicine

## 2023-11-17 DIAGNOSIS — F411 Generalized anxiety disorder: Secondary | ICD-10-CM

## 2023-11-17 DIAGNOSIS — F339 Major depressive disorder, recurrent, unspecified: Secondary | ICD-10-CM

## 2023-11-18 NOTE — Telephone Encounter (Signed)
Requested Prescriptions  Pending Prescriptions Disp Refills   FLUoxetine (PROZAC) 40 MG capsule [Pharmacy Med Name: FLUoxetine HCl Oral Capsule 40 MG] 90 capsule 1    Sig: TAKE 1 CAPSULE EVERY DAY     Psychiatry:  Antidepressants - SSRI Passed - 11/17/2023  5:04 AM      Passed - Completed PHQ-2 or PHQ-9 in the last 360 days      Passed - Valid encounter within last 6 months    Recent Outpatient Visits           3 weeks ago Psychophysiologic insomnia   [place] [facility] [place], Netta Neat, DO   4 months ago Major depression, recurrent, chronic [facility])   [place] [facility] Smitty Cords, DO   6 months ago Seborrheic keratoses   [place] [facility] [place], Salvadore [place], NP   7 months ago B12 deficiency   [facility] Smitty Cords, DO   7 months ago Essential hypertension   [facility] [place], Netta Neat, [place]

## 2023-11-22 ENCOUNTER — Telehealth: Payer: Self-pay

## 2023-11-22 NOTE — Telephone Encounter (Signed)
Copied from CRM [id number]. Topic: General - Inquiry >> Nov 22, 2023 11:53 AM Haroldine Laws wrote: Reason for CRM: pt is having bone pain in her knees and hands.  She wants to know what she can take,  She has been taking what she has but it is not helping.  CB@  [phone]

## 2023-11-22 NOTE — Telephone Encounter (Signed)
Please notify patient of the following  I have reviewed her chart. Last visit was October 2024.  This sounds similar to the same problem we discussed with arthritis of joints and hands, knees.  She was recommended to use Voltaren topical, and Tizanidine occasionally if need.  She is already on Gabapentin  She cannot take anti inflammatory oral meds due to Eliquis.  Only other safe options are  Tylenol Extra Strength 500mg  tabs - take 1 to 2 tabs per dose (max 1000mg ) every 6-8 hours for pain - take regularly up to 3000mg  in 24 hours.  Or if we need we can order a stronger rx such as Tramadol Pain medication. This is a controlled opiate therapy, it is overall safe to take as needed but we have to be cautious with frequent use of stronger pain meds and can run risk of side effects.  Let me know if questions. We can also refer to Orthopedic for consult and they can discuss injections if she has not pursued this already.  Saralyn Pilar, DO [facility] 11/22/2023, 2:36 PM

## 2023-12-16 ENCOUNTER — Ambulatory Visit: Payer: Self-pay

## 2023-12-16 NOTE — Telephone Encounter (Signed)
Summary: Possible thyroid issues, seeking lab appt   Pt called reporting that she believes she is experiencing issues with her thyroid, she wants to come in tomorrow for lab work. Says she is feeling lethargic and lacks any energy.     Chief Complaint: Pt. Feeling lethargic, fatigued. Does not know if it's her thyroid or depression. Asking to be worked in. Symptoms: Above Frequency: Several months Pertinent Negatives: Patient denies any other symptoms Disposition: [] ED /[] Urgent Care (no appt availability in office) / [] Appointment(In office/virtual)/ []  [place] [facility]/ [] Home Care/ [] Refused Recommended Disposition /[] [facility] [facility]/ [x]  Follow-up with PCP Additional Notes: Please advise pt.  Reason for Disposition  [1] Fatigue (i.e., tires easily, decreased energy) AND [2] persists > 1 week  Answer Assessment - Initial Assessment Questions 1. DESCRIPTION: "Describe how you are feeling."     Lethargic, fatigue 2. SEVERITY: "How bad is it?"  "Can you stand and walk?"   - MILD (0-3): Feels weak or tired, but does not interfere with work, school or normal activities.   - MODERATE (4-7): Able to stand and walk; weakness interferes with work, school, or normal activities.   - SEVERE (8-10): Unable to stand or walk; unable to do usual activities.     Mild- moderate 3. ONSET: "When did these symptoms begin?" (e.g., hours, days, weeks, months)     Several months 4. CAUSE: "What do you think is causing the weakness or fatigue?" (e.g., not drinking enough fluids, medical problem, trouble sleeping)     Maybe thyroid 5. NEW MEDICINES:  "Have you started on any new medicines recently?" (e.g., opioid pain medicines, benzodiazepines, muscle relaxants, antidepressants, antihistamines, neuroleptics, beta blockers)     No 6. OTHER SYMPTOMS: "Do you have any other symptoms?" (e.g., chest pain, fever, cough, SOB, vomiting, diarrhea, bleeding, other areas of pain)     No 7. PREGNANCY: "Is  there any chance you are pregnant?" "When was your last menstrual period?"     No  Protocols used: Weakness (Generalized) and Fatigue-A-AH

## 2023-12-16 NOTE — Telephone Encounter (Signed)
Spoke with patient and appointment scheduled 

## 2023-12-20 ENCOUNTER — Other Ambulatory Visit: Payer: Medicare PPO

## 2023-12-24 ENCOUNTER — Ambulatory Visit (INDEPENDENT_AMBULATORY_CARE_PROVIDER_SITE_OTHER): Payer: Medicare PPO | Admitting: Family Medicine

## 2023-12-24 ENCOUNTER — Encounter: Payer: Self-pay | Admitting: Family Medicine

## 2023-12-24 VITALS — BP 124/80 | HR 59 | Ht 62.0 in | Wt 145.0 lb

## 2023-12-24 DIAGNOSIS — E032 Hypothyroidism due to medicaments and other exogenous substances: Secondary | ICD-10-CM

## 2023-12-24 DIAGNOSIS — E538 Deficiency of other specified B group vitamins: Secondary | ICD-10-CM

## 2023-12-24 DIAGNOSIS — F339 Major depressive disorder, recurrent, unspecified: Secondary | ICD-10-CM

## 2023-12-24 DIAGNOSIS — M797 Fibromyalgia: Secondary | ICD-10-CM

## 2023-12-24 DIAGNOSIS — F5104 Psychophysiologic insomnia: Secondary | ICD-10-CM

## 2023-12-24 NOTE — Patient Instructions (Addendum)
Thank you for coming to the office today.  Labs for Thyroid and Vitamin B12 today  We may need to adjust the medications  Consider switch to Lexapro next off of Fluoxetine.  [facility], water quality Available) [facility] [address] [place], [place] [address] Phone: [phone]   Please schedule a Follow-up Appointment to: Return if symptoms worsen or fail to improve.  If you have any other questions or concerns, please feel free to call the office or send a message through MyChart. You may also schedule an earlier appointment if necessary.  Additionally, you may be receiving a survey about your experience at our office within a few days to 1 week by e-mail or mail. We value your feedback.  Saralyn Pilar, DO [facility], [place]

## 2023-12-24 NOTE — Progress Notes (Signed)
Subjective:    Patient ID: Stacey Duncan, female    DOB: 10-31-1931, 89 y.o.   MRN: [id number]  Stacey Duncan is a 89 y.o. female presenting on 12/24/2023 for Depression, Fatigue, and Hypothyroidism   HPI  Discussed the use of AI scribe software for clinical note transcription with the patient, who gave verbal consent to proceed.  History of Present Illness     The patient, with a history of fibromyalgia and hypothyroidism, presents with complaints of extreme lethargy and cognitive fog. She reports feeling as if she could fall asleep at any moment and has been experiencing this for approximately two to three weeks, however chart review and previous discussion seems to be the same issue discussed at previous visits, >6+ months similar discussion with depression insomnia fibromyalgia fatigue and thyroid Last lab 02/2023, due for repeat thyroid panel and Vitamin testing, prior B12 was low has been treated with B12 injection therapy  The patient also reports a significant increase in sleep duration, often sleeping for nine hours at night and then taking additional two to three-hour naps during the day. Despite this, she reports feeling as if she "could not take another step" and has been struggling to complete daily tasks, such as bathing.  The patient is currently on Prozac for depression, but she expresses concern that it may no longer be effective as she has been on it for a long time. She reports a history of trying Wellbutrin, which was not effective, and Sertraline. She also mentions significant emotional lability, with rapid shifts from feeling up to feeling down.  The patient also reports significant stress related to caring for her spouse, who has dementia. She expresses frustration and anger related to this situation, which she believes may be contributing to her mood and energy levels.  She no longer has a therapist. She was working with someone out of [place], but cannot  travel there. She is interested in remote options.     Medication review. Continues on Fluoxetine 40mg  daily, and Levothyroxine daily      12/24/2023   11:27 AM 10/26/2023    6:12 PM 08/26/2023   11:03 AM  Depression screen PHQ 2/9  Decreased Interest 1 1 1   Down, Depressed, Hopeless 1 2 1   PHQ - 2 Score 2 3 2   Altered sleeping 1 3 1   Tired, decreased energy 3 2 1   Change in appetite 2 0   Feeling bad or failure about yourself  0 0   Trouble concentrating 0 1   Moving slowly or fidgety/restless 0 0   Suicidal thoughts 0 0   PHQ-9 Score 8 9 4   Difficult doing work/chores  Somewhat difficult        12/24/2023   11:28 AM 06/23/2023   10:57 AM 04/13/2023   11:22 AM 06/24/2021    1:32 PM  GAD 7 : Generalized Anxiety Score  Nervous, Anxious, on Edge 1 1 1 1   Control/stop worrying 1 1 1  0  Worry too much - different things 1 1 1  0  Trouble relaxing 1 1 1 1   Restless 1 0 0 0  Easily annoyed or irritable 1 1 1 1   Afraid - awful might happen 0 1 1 1   Total GAD 7 Score 6 6 6 4   Anxiety Difficulty  Not difficult at all Somewhat difficult Somewhat difficult    Social History   Tobacco Use   Smoking status: Never   Smokeless tobacco: Never  Vaping Use  Vaping status: Never Used  Substance Use Topics   Alcohol use: Yes    Alcohol/week: 7.0 standard drinks of alcohol    Types: 7 Glasses of wine per week    Comment: glass of wine daily   Drug use: No    Review of Systems Per HPI unless specifically indicated above     Objective:    BP 124/80   Pulse (!) 59   Ht 5\' 2"  (1.575 m)   Wt 145 lb (65.8 kg)   SpO2 97%   BMI 26.52 kg/m   Wt Readings from Last 3 Encounters:  12/24/23 145 lb (65.8 kg)  10/26/23 143 lb (64.9 kg)  06/23/23 141 lb 3.2 oz (64 kg)    Physical Exam Vitals and nursing note reviewed.  Constitutional:      General: She is not in acute distress.    Appearance: Normal appearance. She is well-developed. She is not diaphoretic.      Comments: Well-appearing, comfortable, cooperative  HENT:     Head: Normocephalic and atraumatic.  Eyes:     General:        Right eye: No discharge.        Left eye: No discharge.     Conjunctiva/sclera: Conjunctivae normal.  Cardiovascular:     Rate and Rhythm: Normal rate.  Pulmonary:     Effort: Pulmonary effort is normal.  Skin:    General: Skin is warm and dry.     Findings: No erythema or rash.  Neurological:     Mental Status: She is alert and oriented to person, place, and time.  Psychiatric:        Mood and Affect: Mood normal.        Behavior: Behavior normal.        Thought Content: Thought content normal.     Comments: Well groomed, good eye contact, normal speech and thoughts     Results for orders placed or performed in visit on 03/12/23  Vitamin B12   Collection Time: 03/15/23  9:59 AM  Result Value Ref Range   Vitamin B-12 294 200 - 1,100 pg/mL  T4, free   Collection Time: 03/15/23  9:59 AM  Result Value Ref Range   Free T4 1.3 0.8 - 1.8 ng/dL  TSH   Collection Time: 03/15/23  9:59 AM  Result Value Ref Range   TSH 0.32 (L) 0.40 - 4.50 mIU/L  Hemoglobin A1c   Collection Time: 03/15/23  9:59 AM  Result Value Ref Range   Hgb A1c MFr Bld 5.9 (H) <5.7 % of total Hgb   Mean Plasma Glucose 123 mg/dL   eAG (mmol/L) 6.8 mmol/L  Lipid panel   Collection Time: 03/15/23  9:59 AM  Result Value Ref Range   Cholesterol 139 <200 mg/dL   HDL 48 (L) > OR = 50 mg/dL   Triglycerides 347 <425 mg/dL   LDL Cholesterol (Calc) 69 mg/dL (calc)   Total CHOL/HDL Ratio 2.9 <5.0 (calc)   Non-HDL Cholesterol (Calc) 91 <956 mg/dL (calc)  CBC with Differential/Platelet   Collection Time: 03/15/23  9:59 AM  Result Value Ref Range   WBC 8.3 3.8 - 10.8 Thousand/uL   RBC 4.25 3.80 - 5.10 Million/uL   Hemoglobin 12.9 11.7 - 15.5 g/dL   HCT 38.7 56.4 - 33.2 %   MCV 92.0 80.0 - 100.0 fL   MCH 30.4 27.0 - 33.0 pg   MCHC 33.0 32.0 - 36.0 g/dL   RDW 95.1 88.4 - 16.6 %  Platelets 300 140 - 400 Thousand/uL   MPV 10.0 7.5 - 12.5 fL   Neutro Abs 4,208 1,500 - 7,800 cells/uL   Lymphs Abs 3,212 850 - 3,900 cells/uL   Absolute Monocytes 540 200 - 950 cells/uL   Eosinophils Absolute 274 15 - 500 cells/uL   Basophils Absolute 66 0 - 200 cells/uL   Neutrophils Relative % 50.7 %   Total Lymphocyte 38.7 %   Monocytes Relative 6.5 %   Eosinophils Relative 3.3 %   Basophils Relative 0.8 %  COMPLETE METABOLIC PANEL WITH GFR   Collection Time: 03/15/23  9:59 AM  Result Value Ref Range   Glucose, Bld 93 65 - 99 mg/dL   BUN 9 7 - 25 mg/dL   Creat 4.09 8.11 - 9.14 mg/dL   eGFR 70 > OR = 60 NW/GNF/6.21H0   BUN/Creatinine Ratio SEE NOTE: 6 - 22 (calc)   Sodium 140 135 - 146 mmol/L   Potassium 4.4 3.5 - 5.3 mmol/L   Chloride 104 98 - 110 mmol/L   CO2 29 20 - 32 mmol/L   Calcium 9.1 8.6 - 10.4 mg/dL   Total Protein 6.4 6.1 - 8.1 g/dL   Albumin 3.9 3.6 - 5.1 g/dL   Globulin 2.5 1.9 - 3.7 g/dL (calc)   AG Ratio 1.6 1.0 - 2.5 (calc)   Total Bilirubin 0.6 0.2 - 1.2 mg/dL   Alkaline phosphatase (APISO) 75 37 - 153 U/L   AST 16 10 - 35 U/L   ALT 13 6 - 29 U/L      Assessment & Plan:   Problem List Items Addressed This Visit     Fibromyalgia - Primary   Hypothyroidism   Major depression, recurrent, chronic (HCC)   Other Visit Diagnoses       Psychophysiologic insomnia         B12 deficiency            Assessment and Plan    Hypothyroidism Vitamin B12 Deficiency, s/p injection replacement therapy Patient reports extreme fatigue and lethargy. Last thyroid and vitamin B12 levels checked in the spring. Unclear if symptoms are due to hypothyroidism or another cause. -Draw blood today to check thyroid and vitamin B12 levels. -Consider adjusting thyroid medication based on lab results.  Depression, major, moderate recurrent Patient reports significant mood swings, lethargy, and difficulty with daily activities Currently on Prozac Fluoxetine 40mg  daily, but  symptoms persist. Patient has tried Wellbutrin and sertraline in the past without success. -Consider switching to Lexapro if thyroid levels are normal. -Recommend patient contact [facility] for potential virtual therapy sessions virtually given she cannot travel to in person provider in [place] now  Fibromyalgia Chronic condition contributing to patient's overall discomfort and fatigue. -Continue current management plan.  Follow-up Plan for phone call in 1-2 weeks to discuss lab results and potential medication adjustments.      Released orders B12, TSH Free T4 today   No orders of the defined types were placed in this encounter.   No orders of the defined types were placed in this encounter.   Follow up plan: Return if symptoms worsen or fail to improve.   Saralyn Pilar, DO [facility] 12/24/2023, 11:08 AM

## 2023-12-25 LAB — TSH: TSH: 2.02 m[IU]/L (ref 0.40–4.50)

## 2023-12-25 LAB — T4, FREE: Free T4: 1.3 ng/dL (ref 0.8–1.8)

## 2023-12-25 LAB — VITAMIN B12: Vitamin B-12: 348 pg/mL (ref 200–1100)

## 2023-12-27 NOTE — Progress Notes (Signed)
Spoke to patient about lab results. Went over all information. Verbal understanding.

## 2024-01-03 ENCOUNTER — Other Ambulatory Visit: Payer: Self-pay

## 2024-01-10 NOTE — Patient Outreach (Signed)
  Care Management   Visit Note   Name: Stacey Duncan MRN: [id number] DOB: [birth year]  Subjective: Stacey Duncan is a 89 y.o. year old female who is a primary care patient of Edman Marsa JINNY, DO. The Care Management team was consulted for assistance.      Engaged with Stacey Duncan vie telephone.  Assessment:  Review of patient past medical history, allergies, medications, health status, including review of consultants reports, laboratory and other test data, was performed as part of  evaluation and provision of care management services.    Outpatient Encounter Medications as of 01/03/2024  Medication Sig   atorvastatin  (LIPITOR) 80 MG tablet TAKE 1 TABLET BY MOUTH ONCE DAILY AT  6PM   Cholecalciferol  25 MCG (1000 UT) tablet Take 1,000 Units by mouth daily.    clobetasol  cream (TEMOVATE ) 0.05 % Apply 1 application topically 2 (two) times daily as needed (skin irritation).   ELIQUIS  5 MG TABS tablet TAKE 1 TABLET TWICE DAILY   FLUoxetine  (PROZAC ) 40 MG capsule TAKE 1 CAPSULE EVERY DAY   furosemide  (LASIX ) 20 MG tablet TAKE 1 TABLET EVERY DAY   gabapentin  (NEURONTIN ) 300 MG capsule Take 2 capsules (600 mg total) by mouth at bedtime.   levothyroxine  (SYNTHROID ) 100 MCG tablet Take 1 tablet (100 mcg total) by mouth daily before breakfast.   metoprolol  tartrate (LOPRESSOR ) 25 MG tablet TAKE 1/2 TABLET TWICE A DAY.   omeprazole  (PRILOSEC) 40 MG capsule Take 1 capsule (40 mg total) by mouth daily as needed.   tiZANidine  (ZANAFLEX ) 2 MG tablet Take 1 tablet (2 mg total) by mouth every 8 (eight) hours as needed for muscle spasms.   No facility-administered encounter medications on file as of 01/03/2024.

## 2024-02-07 ENCOUNTER — Ambulatory Visit
Admission: RE | Admit: 2024-02-07 | Discharge: 2024-02-07 | Disposition: A | Discharge status: Home / Self Care | Payer: Medicare PPO | Source: Ambulatory Visit | Attending: Family Medicine | Admitting: Family Medicine

## 2024-02-07 ENCOUNTER — Encounter: Payer: Self-pay | Admitting: Family Medicine

## 2024-02-07 ENCOUNTER — Ambulatory Visit: Payer: Self-pay

## 2024-02-07 ENCOUNTER — Ambulatory Visit
Admission: RE | Admit: 2024-02-07 | Discharge: 2024-02-07 | Disposition: A | Discharge status: Home / Self Care | Payer: Medicare PPO | Attending: Family Medicine | Admitting: Family Medicine

## 2024-02-07 ENCOUNTER — Ambulatory Visit: Payer: Medicare PPO | Admitting: Family Medicine

## 2024-02-07 VITALS — BP 112/48 | HR 76 | Ht 62.0 in | Wt 145.0 lb

## 2024-02-07 DIAGNOSIS — N3001 Acute cystitis with hematuria: Secondary | ICD-10-CM

## 2024-02-07 DIAGNOSIS — R339 Retention of urine, unspecified: Secondary | ICD-10-CM | POA: Diagnosis not present

## 2024-02-07 DIAGNOSIS — R300 Dysuria: Secondary | ICD-10-CM

## 2024-02-07 DIAGNOSIS — K5900 Constipation, unspecified: Secondary | ICD-10-CM

## 2024-02-07 DIAGNOSIS — R1032 Left lower quadrant pain: Secondary | ICD-10-CM | POA: Insufficient documentation

## 2024-02-07 DIAGNOSIS — R140 Abdominal distension (gaseous): Secondary | ICD-10-CM | POA: Diagnosis not present

## 2024-02-07 LAB — POCT URINALYSIS DIPSTICK
Bilirubin, UA: NEGATIVE
Glucose, UA: NEGATIVE
Ketones, UA: NEGATIVE
Nitrite, UA: NEGATIVE
Odor: POSITIVE
Protein, UA: NEGATIVE
Spec Grav, UA: 1.015 (ref 1.010–1.025)
Urobilinogen, UA: 0.2 U/dL
pH, UA: 6 (ref 5.0–8.0)

## 2024-02-07 MED ORDER — CIPROFLOXACIN HCL 250 MG PO TABS: 250.0000 mg | ORAL_TABLET | Freq: Two times a day (BID) | ORAL | 0 refills | Status: AC

## 2024-02-07 NOTE — Telephone Encounter (Signed)
  Chief Complaint: pelvic pain, left side >right side Symptoms: bilateral back pain, difficulty emptying bladder, frequency, pain varies  Frequency: weekend Pertinent Negatives: Patient denies fever, vomiting, diarrhea Disposition: [] ED /[] Urgent Care (no appt availability in office) / [x] Appointment(In office/virtual)/ []  [place] [facility]/ [] Home Care/ [] Refused Recommended Disposition /[] [place] [facility]/ []  Follow-up with PCP Additional Notes: has seen urologist in past  Reason for Disposition  [1] MODERATE pain (e.g., interferes with normal activities) AND [2] pain comes and goes (cramps) AND [3] present > 24 hours  (Exception: Pain with Vomiting or Diarrhea - see that Guideline.)  Answer Assessment - Initial Assessment Questions 1. LOCATION: "Where does it hurt?"      Pelvic pain left>right 2. RADIATION: "Does the pain shoot anywhere else?" (e.g., chest, back)     no 3. ONSET: "When did the pain begin?" (e.g., minutes, hours or days ago)      H/o problems emptying bladder  4. SUDDEN: "Gradual or sudden onset?"     suddenly 5. PATTERN "Does the pain come and go, or is it constant?"    - If it comes and goes: "How long does it last?" "Do you have pain now?"     (Note: Comes and goes means the pain is intermittent. It goes away completely between bouts.)    - If constant: "Is it getting better, staying the same, or getting worse?"      (Note: Constant means the pain never goes away completely; most serious pain is constant and gets worse.)      Comes and goes/ hurts when presses down also  6. SEVERITY: "How bad is the pain?"  (e.g., Scale 1-10; mild, moderate, or severe)    - MILD (1-3): Doesn't interfere with normal activities, abdomen soft and not tender to touch.     - MODERATE (4-7): Interferes with normal activities or awakens from sleep, abdomen tender to touch.     - SEVERE (8-10): Excruciating pain, doubled over, unable to do any normal activities.        can be  moderate// 8. CAUSE: "What do you think is causing the stomach pain?"     bladder 10. OTHER SYMPTOMS: "Do you have any other symptoms?" (e.g., back pain, diarrhea, fever, urination pain, vomiting)       Trouble emptying bladder, back pain both sides, frequency  Protocols used: Abdominal Pain - Female-A-AH

## 2024-02-07 NOTE — Progress Notes (Signed)
 Subjective:    Patient ID: Stacey Duncan, female    DOB: 02-Dec-1931, 89 y.o.   MRN: [id number]  Stacey Duncan is a 89 y.o. female presenting on 02/07/2024 for No chief complaint on file.   HPI  Discussed the use of AI scribe software for clinical note transcription with the patient, who gave verbal consent to proceed.  History of Present Illness   Stacey Duncan is a 89 year old female with recurrent urinary tract infections who presents with pelvic pain and trouble emptying the bladder.  She experiences pelvic pain and difficulty emptying her bladder, which began this morning. The pain was severe enough to wake her up and is located in the left lower groin area, radiating across the lower abdomen. The pain comes in waves but has improved since this morning, although there is still soreness upon touch. She reports incomplete bladder emptying and sometimes needing to push to urinate.  She has a history of recurrent urinary tract infections, with the last documented episode occurring in 2021, which was treated with Cipro . She mentions having bladder problems intermittently for a while, but not severe enough to be concerning until now.  She describes feeling mentally 'out in left field' or foggy, which she experiences intermittently, but notes it was particularly bad today. This sensation has been present for about two weeks, with some improvement until today.  She reports having a bowel movement today, but it was not normal, and she has noticed a lot of gas and bubbling sounds. No changes in bowel habits today, although she did have a bowel movement. She denies taking stool softeners.           01/03/2024   11:41 AM 12/24/2023   11:27 AM 10/26/2023    6:12 PM  Depression screen PHQ 2/9  Decreased Interest 1 1 1   Down, Depressed, Hopeless 1 1 2   PHQ - 2 Score 2 2 3   Altered sleeping 1 1 3   Tired, decreased energy 3 3 2   Change in appetite 2 2 0  Feeling bad or failure  about yourself  0 0 0  Trouble concentrating 0 0 1  Moving slowly or fidgety/restless 0 0 0  Suicidal thoughts 0 0 0  PHQ-9 Score 8 8 9   Difficult doing work/chores   Somewhat difficult       12/24/2023   11:28 AM 06/23/2023   10:57 AM 04/13/2023   11:22 AM 06/24/2021    1:32 PM  GAD 7 : Generalized Anxiety Score  Nervous, Anxious, on Edge 1 1 1 1   Control/stop worrying 1 1 1  0  Worry too much - different things 1 1 1  0  Trouble relaxing 1 1 1 1   Restless 1 0 0 0  Easily annoyed or irritable 1 1 1 1   Afraid - awful might happen 0 1 1 1   Total GAD 7 Score 6 6 6 4   Anxiety Difficulty  Not difficult at all Somewhat difficult Somewhat difficult    Social History   Tobacco Use   Smoking status: Never   Smokeless tobacco: Never  Vaping Use   Vaping status: Never Used  Substance Use Topics   Alcohol use: Yes    Alcohol/week: 7.0 standard drinks of alcohol    Types: 7 Glasses of wine per week    Comment: glass of wine daily   Drug use: No    Review of Systems Per HPI unless specifically indicated above     Objective:  BP (!) 112/48   Pulse 76   Ht 5\' 2"  (1.575 m)   Wt 145 lb (65.8 kg)   SpO2 95%   BMI 26.52 kg/m   Wt Readings from Last 3 Encounters:  02/07/24 145 lb (65.8 kg)  12/24/23 145 lb (65.8 kg)  10/26/23 143 lb (64.9 kg)    Physical Exam Vitals and nursing note reviewed.  Constitutional:      General: She is not in acute distress.    Appearance: She is well-developed. She is not diaphoretic.     Comments: Well-appearing, comfortable, cooperative  HENT:     Head: Normocephalic and atraumatic.  Eyes:     General:        Right eye: No discharge.        Left eye: No discharge.     Conjunctiva/sclera: Conjunctivae normal.  Neck:     Thyroid : No thyromegaly.  Cardiovascular:     Rate and Rhythm: Normal rate and regular rhythm.     Heart sounds: Normal heart sounds. No murmur heard. Pulmonary:     Effort: Pulmonary effort is normal. No respiratory  distress.     Breath sounds: Normal breath sounds. No wheezing or rales.  Musculoskeletal:        General: Normal range of motion.     Cervical back: Normal range of motion and neck supple.  Lymphadenopathy:     Cervical: No cervical adenopathy.  Skin:    General: Skin is warm and dry.     Findings: No erythema or rash.  Neurological:     Mental Status: She is alert and oriented to person, place, and time.  Psychiatric:        Behavior: Behavior normal.     Comments: Well groomed, good eye contact, normal speech and thoughts     Results for orders placed or performed in visit on 02/07/24  POCT Urinalysis Dipstick   Collection Time: 02/07/24  3:04 PM  Result Value Ref Range   Color, UA yellow    Clarity, UA clear    Glucose, UA Negative Negative   Bilirubin, UA negative    Ketones, UA negative    Spec Grav, UA 1.015 1.010 - 1.025   Blood, UA moderate    pH, UA 6.0 5.0 - 8.0   Protein, UA Negative Negative   Urobilinogen, UA 0.2 0.2 or 1.0 E.U./dL   Nitrite, UA negative    Leukocytes, UA Moderate (2+) (A) Negative   Appearance     Odor positive       Assessment & Plan:   Problem List Items Addressed This Visit   None Visit Diagnoses       Acute cystitis with hematuria    -  Primary   Relevant Medications   ciprofloxacin  (CIPRO ) 250 MG tablet     Dysuria       Relevant Orders   POCT Urinalysis Dipstick (Completed)   Urine Culture     LLQ abdominal pain       Relevant Orders   DG Abd 1 View (Completed)     Constipation, unspecified constipation type       Relevant Orders   DG Abd 1 View (Completed)     Incomplete bladder emptying       Relevant Orders   Urine Culture       Pelvic Pain and Urinary Symptoms Acute onset of left lower quadrant pain with difficulty emptying bladder. Urinalysis shows blood and white blood cells, suggestive of possible urinary tract  infection (UTI). History of recurrent UTIs, last treated four years ago. -Order abdominal X-ray to  rule out bowel issues. -Start Ciprofloxacin  250mg  twice daily for 5 days for suspected UTI. -Consider D-mannose supplement for UTI prevention.  Cognitive Impairment Reports of feeling "foggy" and "not all with it", intermittent in nature. -Monitor symptoms, could be related to current suspected UTI.  Constipation Reports of abnormal bowel movement, presence of bowel sounds on examination. -If X-ray shows constipation or bowel blockage, consider starting a bowel medication like Miralax.  Follow-up After completion of antibiotic course, reassess symptoms and response to treatment.         Orders Placed This Encounter  Procedures   Urine Culture   DG Abd 1 View    Standing Status:   Future    Number of Occurrences:   1    Expiration Date:   05/06/2024    Reason for Exam (SYMPTOM  OR DIAGNOSIS REQUIRED):   LLQ abdominal pain and cramping constipation    Preferred imaging location?:   [facility]   POCT Urinalysis Dipstick    Meds ordered this encounter  Medications   ciprofloxacin  (CIPRO ) 250 MG tablet    Sig: Take 1 tablet (250 mg total) by mouth 2 (two) times daily for 5 days.    Dispense:  10 tablet    Refill:  0    Follow up plan: Return if symptoms worsen or fail to improve.   Domingo Friend, DO [facility] 02/07/2024, 3:23 PM

## 2024-02-07 NOTE — Patient Instructions (Addendum)
 Thank you for coming to the office today.  X-ray today of abdomen  If it does show constipation or bowel blockage  For Constipation (less frequent bowel movement that can be hard dry or involve straining).  Recommend trying OTC Miralax 17g = 1 capful in large glass water once daily for now, try several days to see if working, goal is soft stool or BM 1-2 times daily, if too loose then reduce dose or try every other day. If not effective may need to increase it to 2 doses at once in AM or may do 1 in morning and 1 in afternoon/evening  - This medicine is very safe and can be used often without any problem and will not make you dehydrated. It is good for use on AS NEEDED BASIS or even MAINTENANCE therapy for longer term for several days to weeks at a time to help regulate bowel movements  Other more natural remedies or preventative treatment: - Increase hydration with water - Increase fiber in diet (high fiber foods = vegetables, leafy greens, oats/grains) - May take OTC Fiber supplement (metamucil powder or pill/gummy) - May try OTC Probiotic  ------------------------  1. LIKELY you have a Urinary Tract Infection - this is very common, your symptoms are reassuring and you should get better within 1 week on the antibiotics - Start Cipro  250mg  2 times daily for next 5 days, complete entire course, even if feeling better - We sent urine for a culture, we will call you within next few days if we need to change antibiotics - Please drink plenty of fluids, improve hydration over next 1 week  If symptoms worsening, developing nausea / vomiting, worsening back pain, fevers / chills / sweats, then please return for re-evaluation sooner.  If you take AZO OTC - limit this to 2-3 days MAX to avoid affecting kidneys  D-Mannose is a natural supplement that can actually help bind to urinary bacteria and reduce their effectiveness it can help prevent UTI from forming, and may reduce some symptoms. It  likely cannot cure an active UTI but it is worth a try and good to prevent them with. Try 500mg  twice a day at a full dose if you want, or check package instructions for more info    Please schedule a Follow-up Appointment to: Return if symptoms worsen or fail to improve.  If you have any other questions or concerns, please feel free to call the office or send a message through MyChart. You may also schedule an earlier appointment if necessary.  Additionally, you may be receiving a survey about your experience at our office within a few days to 1 week by e-mail or mail. We value your feedback.  Domingo Friend, DO [facility], [place]

## 2024-02-09 LAB — URINE CULTURE
MICRO NUMBER:: 16065844
SPECIMEN QUALITY:: ADEQUATE

## 2024-02-10 ENCOUNTER — Telehealth: Payer: Self-pay

## 2024-02-10 NOTE — Telephone Encounter (Signed)
Patient called for lab results. Shared provider's note. Pt states that s/s are resolving, however pain in abdomen is still there, but lessened.  Smitty Cords, DO 02/09/2024  7:15 PM EST     Please call patient (No Mychart Access)   Urine culture confirms UTI bacterial infection. It is sensitive to all antibiotics. The cipro rx should resolve the issue. Please follow up or notify us if not improving.   Saralyn Pilar, DO [facility] 02/09/2024, 7:15 PM

## 2024-02-10 NOTE — Telephone Encounter (Signed)
Understood. I don't have new recommendations if starting to improve. She can follow-up if new or changes.  Saralyn Pilar, DO [facility] 02/10/2024, 11:40 AM

## 2024-02-10 NOTE — Telephone Encounter (Signed)
Patient symptoms are improving, no other concerns. Will call back if symptoms worsen or fail to improve.

## 2024-03-25 ENCOUNTER — Other Ambulatory Visit: Payer: Self-pay | Admitting: Physician Assistant

## 2024-04-02 ENCOUNTER — Other Ambulatory Visit: Payer: Self-pay | Admitting: Cardiovascular Disease

## 2024-04-03 NOTE — Telephone Encounter (Signed)
 Prescription refill request for Eliquis received. Indication:afib Last office visit:upcoming ZOX:WRUEA labs Age: 89 Weight:65.8  kg  Prescription refilled

## 2024-04-12 ENCOUNTER — Other Ambulatory Visit: Payer: Self-pay | Admitting: Family Medicine

## 2024-04-12 DIAGNOSIS — F411 Generalized anxiety disorder: Secondary | ICD-10-CM

## 2024-04-12 DIAGNOSIS — E032 Hypothyroidism due to medicaments and other exogenous substances: Secondary | ICD-10-CM

## 2024-04-12 DIAGNOSIS — F339 Major depressive disorder, recurrent, unspecified: Secondary | ICD-10-CM

## 2024-04-13 NOTE — Telephone Encounter (Signed)
 Requested Prescriptions  Pending Prescriptions Disp Refills   levothyroxine (SYNTHROID) 100 MCG tablet [Pharmacy Med Name: Levothyroxine Sodium Oral Tablet 100 MCG] 90 tablet 0    Sig: TAKE 1 TABLET EVERY DAY BEFORE BREAKFAST     Endocrinology:  Hypothyroid Agents Passed - 04/13/2024  9:31 AM      Passed - TSH in normal range and within 360 days    TSH  Date Value Ref Range Status  12/24/2023 2.02 0.40 - 4.50 mIU/L Final         Passed - Valid encounter within last 12 months    Recent Outpatient Visits           2 months ago Acute cystitis with hematuria   [facility] Raina Bunting, DO       Future Appointments             In 1 month Gollan, Timothy J, MD [facility]             FLUoxetine (PROZAC) 40 MG capsule [Pharmacy Med Name: FLUoxetine HCl Oral Capsule 40 MG] 90 capsule 0    Sig: TAKE 1 CAPSULE EVERY DAY     Psychiatry:  Antidepressants - SSRI Passed - 04/13/2024  9:31 AM      Passed - Completed PHQ-2 or PHQ-9 in the last 360 days      Passed - Valid encounter within last 6 months    Recent Outpatient Visits           2 months ago Acute cystitis with hematuria    [facility] Raina Bunting, DO       Future Appointments             In 1 month Gollan, Deadra Everts, MD [facility]

## 2024-05-15 NOTE — Progress Notes (Signed)
 Cardiology Clinic Note   Date: 05/18/2024 ID: Dia, Donate January 24, 1931, MRN [id number]  Primary Cardiologist:  Belva Boyden, MD  Chief Complaint   Stacey Duncan is a 89 y.o. female who presents to the clinic today for routine follow up.   Patient Profile   Stacey Duncan is followed by Dr. Gollan for the history outlined below.      Past medical history significant for: PAF. Onset March 2016. 14-day ZIO 07/20/2019: HR 48 to 174 bpm, average 70 bpm.  Predominantly sinus rhythm.  8 runs of SVT/A. tach fastest 4 beats max rate 174 bpm, longest 14 beats average rate 135 bpm.  Rare ectopy. Echo 10/21/2022: EF 60 to 65%.  No RWMA.  Grade I DD.  Normal RV function, mild RVH.  Moderately elevated PA pressure, RVSP 45.2 mmHg.  Severe LAE.  Mild to moderate MR. Hypertension. Hyperlipidemia. Lipid panel 03/15/2023: LDL 69, HDL 48, TG 139, total 139. GERD. Hypothyroidism. Prediabetes. Depression/GAD.  In summary, patient was diagnosed with A-fib in March 2016 converting with IV amiodarone .  She had a recurrence of A-fib in October 2018 again converted with amiodarone .  Echo 2017 demonstrated EF 60% with no significant valvular abnormalities.  14-day ZIO in July 2020 showed 8 runs of SVT as detailed above.  Echo October 2023 showed normal LV/RV function as detailed above.  Patient was last seen in the office by Dr. Gollan on 03/16/2023 for routine follow-up.  She was experiencing significant balance issues noted when going from chair to exam table.  She denied recent falls.  Hydralazine  was held.     History of Present Illness    Today, patient is here alone. Patient denies shortness of breath, dyspnea on exertion, lower extremity edema, orthopnea or PND. No chest pain, pressure, or tightness. No palpitations.  She reports she occasionally feels "foggy." This occurs randomly. She describes this as feeling different than "fibro-fog" related to her fibromyalgia. She also states  this is different than her chronic fatigue. She was told by PCP that it could be related to medications. She decided to stop taking metoprolol  in the morning for the last 2 days but she is having fogginess today. She reports taking Lasix  as needed with last dose 3 days ago. When asked why she felt she needed to take the Lasix  she reports she was not urinating as much as normal. She is active in her home. She does light to moderate household chores. She does report she has to chose she can either do housework or cook but she does not have the energy to both in one day.     ROS: All other systems reviewed and are otherwise negative except as noted in History of Present Illness.  EKGs/Labs Reviewed    EKG Interpretation Date/Time:  Thursday May 18 2024 14:35:13 EDT Ventricular Rate:  59 PR Interval:  144 QRS Duration:  76 QT Interval:  440 QTC Calculation: 435 R Axis:   -17  Text Interpretation: Sinus bradycardia Minimal voltage criteria for LVH, may be normal variant ( R in aVL ) Cannot rule out Anterior infarct , age undetermined When compared with ECG of 03/16/2023 (not in Muse) No significant change Confirmed by Morey Ar [phone]) on 05/18/2024 2:41:50 PM    12/24/2023: TSH 2.02    Risk Assessment/Calculations     CHA2DS2-VASc Score = 4   This indicates a 4.8% annual risk of stroke. The patient's score is based upon: CHF History: 0 HTN History: 1 Diabetes  History: 0 Stroke History: 0 Vascular Disease History: 0 Age Score: 2 Gender Score: 1             Physical Exam    VS:  BP 136/70 (BP Location: Left Arm, Patient Position: Sitting, Cuff Size: Normal)   Pulse (!) 59   Ht 5\' 3"  (1.6 m)   Wt 147 lb (66.7 kg)   SpO2 96%   BMI 26.04 kg/m  , BMI Body mass index is 26.04 kg/m.  GEN: Well nourished, well developed, in no acute distress. Neck: No JVD or carotid bruits. Cardiac:  RRR. No murmurs. No rubs or gallops.   Respiratory:  Respirations regular and  unlabored. Clear to auscultation without rales, wheezing or rhonchi. GI: Soft, nontender, nondistended. Extremities: Radials/DP/PT 2+ and equal bilaterally. No clubbing or cyanosis. No edema.  Skin: Warm and dry, no rash. Neuro: Strength intact.  Assessment & Plan   PAF/Foggy-headed Onset March 2016.  14-day ZIO July 2022 showed HR 48 to 174 bpm, average 70 bpm, predominantly sinus rhythm, 8 runs of SVT/A. tach, rare ectopy.  Echo October 2023 showed normal LV/RV function, Grade I DD, mild RVH, moderately elevated PA pressure, severe LAE, mild to moderate MR.  Patient denies palpitations. Patient reports chronic fatigue unchanged from previous. She paces her home activities as tolerated. She reports episodes of feeling foggy-headed. Some days she feels totally normally and other days she feels like she is in a "fog." PCP mentioned it could be related to medications. She decided to skip her morning dose of metoprolol  for the last 2 days. She does not think it is making a difference because she feels foggy today. Discussed some of the medications that could cause this symptom. We will trial changing metoprolol  tartrate to Toprol  to be taken in the evening.  EKG demonstrates sinus bradycardia 59 bpm.  - Continue Eliquis . Appropriate Eliquis  dose. - Stop metoprolol  tartrate and start Toprol  12.5 mg in the evening.  - CBC, BMP, TSH, Mg today.   Hypertension BP today 144/72 on intake and 136/70 on my recheck. No headaches or dizziness reported.  - Continue metoprolol  (as above).  Hyperlipidemia LDL 69 March 2024, at goal. - Continue atorvastatin .  Disposition: Stop metoprolol  tartrate and start Toprol  12.5 mg in the evening. CBC, BMP, TSH, Mg today. Return in 1 year or sooner as needed.          Signed, Lonell Rives. Essa Malachi, DNP, NP-C

## 2024-05-16 ENCOUNTER — Ambulatory Visit: Admitting: Cardiovascular Disease

## 2024-05-18 ENCOUNTER — Encounter: Payer: Self-pay | Admitting: Student

## 2024-05-18 ENCOUNTER — Ambulatory Visit: Attending: Student | Admitting: Student

## 2024-05-18 VITALS — BP 136/70 | HR 59 | Ht 63.0 in | Wt 147.0 lb

## 2024-05-18 DIAGNOSIS — Z79899 Other long term (current) drug therapy: Secondary | ICD-10-CM

## 2024-05-18 DIAGNOSIS — I10 Essential (primary) hypertension: Secondary | ICD-10-CM

## 2024-05-18 DIAGNOSIS — E782 Mixed hyperlipidemia: Secondary | ICD-10-CM

## 2024-05-18 DIAGNOSIS — R4189 Other symptoms and signs involving cognitive functions and awareness: Secondary | ICD-10-CM

## 2024-05-18 DIAGNOSIS — I480 Paroxysmal atrial fibrillation: Secondary | ICD-10-CM

## 2024-05-18 NOTE — Patient Instructions (Signed)
 Medication Instructions:  Your physician recommends the following medication changes.  STOP TAKING: Metoprolol  Tartrate  START TAKING: Metoprolol  Succinate 12.5 mg (1/2 of 25 mg tablet) once daily in the evening  *If you need a refill on your cardiac medications before your next appointment, please call your pharmacy*  Lab Work: Your provider would like for you to have following labs drawn today BMet, CBC, Magnesium , TSH.   If you have labs (blood work) drawn today and your tests are completely normal, you will receive your results only by: MyChart Message (if you have MyChart) OR A paper copy in the mail If you have any lab test that is abnormal or we need to change your treatment, we will call you to review the results.  Testing/Procedures: None ordered at this time   Follow-Up: At [facility], you and your health needs are our priority.  As part of our continuing mission to provide you with exceptional heart care, our providers are all part of one team.  This team includes your primary Cardiologist (physician) and Advanced Practice Providers or APPs (Physician Assistants and Nurse Practitioners) who all work together to provide you with the care you need, when you need it.  Your next appointment:   12 month(s)  Provider:   You may see Timothy Gollan, MD or one of the following Advanced Practice Providers on your designated Care Team:   Laneta Pintos, NP Gildardo Labrador, PA-C Varney Gentleman, PA-C Cadence [place], PA-C Ronald Cockayne, NP Morey Ar, NP    We recommend signing up for the patient portal called "MyChart".  Sign up information is provided on this After Visit Summary.  MyChart is used to connect with patients for Virtual Visits (Telemedicine).  Patients are able to view lab/test results, encounter notes, upcoming appointments, etc.  Non-urgent messages can be sent to your provider as well.   To learn more about what you can do with MyChart, go to  [email or web address].

## 2024-05-19 ENCOUNTER — Ambulatory Visit: Payer: Self-pay | Admitting: Student

## 2024-05-19 LAB — BASIC METABOLIC PANEL WITH GFR
BUN/Creatinine Ratio: 14 (ref 12–28)
BUN: 11 mg/dL (ref 10–36)
CO2: 22 mmol/L (ref 20–29)
Calcium: 9.2 mg/dL (ref 8.7–10.3)
Chloride: 101 mmol/L (ref 96–106)
Creatinine, Ser: 0.77 mg/dL (ref 0.57–1.00)
Glucose: 85 mg/dL (ref 70–99)
Potassium: 4.6 mmol/L (ref 3.5–5.2)
Sodium: 139 mmol/L (ref 134–144)
eGFR: 73 mL/min/{1.73_m2} (ref >59)

## 2024-05-19 LAB — CBC
Hematocrit: 40.7 % (ref 34.0–46.6)
Hemoglobin: 13.2 g/dL (ref 11.1–15.9)
MCH: 30.8 pg (ref 26.6–33.0)
MCHC: 32.4 g/dL (ref 31.5–35.7)
MCV: 95 fL (ref 79–97)
Platelets: 276 10*3/uL (ref 150–450)
RBC: 4.28 x10E6/uL (ref 3.77–5.28)
RDW: 14 % (ref 11.7–15.4)
WBC: 9.4 10*3/uL (ref 3.4–10.8)

## 2024-05-19 LAB — MAGNESIUM: Magnesium: 2.2 mg/dL (ref 1.6–2.3)

## 2024-05-19 LAB — TSH: TSH: 0.543 u[IU]/mL (ref 0.450–4.500)

## 2024-05-23 ENCOUNTER — Emergency Department
Admission: EM | Admit: 2024-05-23 | Discharge: 2024-05-24 | Disposition: A | Discharge status: Home / Self Care | Attending: Emergency Medicine | Admitting: Emergency Medicine

## 2024-05-23 ENCOUNTER — Other Ambulatory Visit: Payer: Self-pay

## 2024-05-23 ENCOUNTER — Emergency Department

## 2024-05-23 ENCOUNTER — Encounter: Payer: Self-pay | Admitting: Emergency Medicine

## 2024-05-23 DIAGNOSIS — S42212A Unspecified displaced fracture of surgical neck of left humerus, initial encounter for closed fracture: Secondary | ICD-10-CM

## 2024-05-23 DIAGNOSIS — W01198A Fall on same level from slipping, tripping and stumbling with subsequent striking against other object, initial encounter: Secondary | ICD-10-CM | POA: Diagnosis not present

## 2024-05-23 DIAGNOSIS — Y92009 Unspecified place in unspecified non-institutional (private) residence as the place of occurrence of the external cause: Secondary | ICD-10-CM | POA: Diagnosis not present

## 2024-05-23 DIAGNOSIS — S0990XA Unspecified injury of head, initial encounter: Secondary | ICD-10-CM

## 2024-05-23 DIAGNOSIS — S4992XA Unspecified injury of left shoulder and upper arm, initial encounter: Secondary | ICD-10-CM | POA: Diagnosis present

## 2024-05-23 DIAGNOSIS — S0083XA Contusion of other part of head, initial encounter: Secondary | ICD-10-CM

## 2024-05-23 DIAGNOSIS — Y9301 Activity, walking, marching and hiking: Secondary | ICD-10-CM | POA: Diagnosis not present

## 2024-05-23 DIAGNOSIS — Z7901 Long term (current) use of anticoagulants: Secondary | ICD-10-CM | POA: Diagnosis not present

## 2024-05-23 DIAGNOSIS — W19XXXA Unspecified fall, initial encounter: Secondary | ICD-10-CM

## 2024-05-23 MED ORDER — ONDANSETRON HCL 4 MG/2ML IJ SOLN
4.0000 mg | INTRAMUSCULAR | Status: AC
  Administered 2024-05-23: 4 mg via INTRAVENOUS
  Filled 2024-05-23: qty 2

## 2024-05-23 MED ORDER — MORPHINE SULFATE (PF) 4 MG/ML IV SOLN
4.0000 mg | Freq: Once | INTRAVENOUS | Status: AC
  Administered 2024-05-23: 4 mg via INTRAVENOUS
  Filled 2024-05-23: qty 1

## 2024-05-23 NOTE — ED Provider Notes (Signed)
 [facility] Provider Note    Event Date/Time   First MD Initiated Contact with Patient 05/23/24 2319     (approximate)   History   Fall   HPI Stacey Duncan is a 89 y.o. female with a history of atrial fibrillation and chronic Eliquis  use.  She presents for evaluation after a fall at home.  She states she was walking the bedroom and is not sure what happened but she fell to the floor, striking the left side of her forehead on the floor and also injuring her left arm.  She reports that it hurts from the elbow up to the shoulder.  Any amount of movement makes the pain worse.  She said that her head aches a little bit but the pain is not bad, and she has no neck pain.  At no point has she had any chest pain nor shortness of breath and she did not pass out after the fall.  She said that her knees are sore but she has no difficulty bearing weight or ambulating.  No abdominal pain, nausea, nor vomiting.     Physical Exam   Triage Vital Signs: ED Triage Vitals  Encounter Vitals Group     BP 05/23/24 2323 (!) 158/64     Systolic BP Percentile --      Diastolic BP Percentile --      Pulse Rate 05/23/24 2324 63     Resp --      Temp 05/23/24 2326 98.3 F (36.8 C)     Temp Source 05/23/24 2326 Oral     SpO2 05/23/24 2324 97 %     Weight 05/23/24 2322 65.8 kg (145 lb)     Height 05/23/24 2322 1.6 m (5\' 3" )     Head Circumference --      Peak Flow --      Pain Score 05/23/24 2322 10     Pain Loc --      Pain Education --      Exclude from Growth Chart --     Most recent vital signs: Vitals:   05/24/24 0145 05/24/24 0155  BP:  (!) 141/63  Pulse: 61   Resp:  18  Temp:    SpO2: 97%     General: Awake, appears uncomfortable and in pain but is fully alert, oriented, and conversant. Head:  Patient has a contusion, abrasion, and hematoma to her left temple near the left lateral aspect of her eye but no laceration requiring repair.  Extraocular movement is  normal.  Pupils are equal and reactive.  No evidence of globe injury and no evidence of any other facial trauma.  Patient has no other obvious hematomas or contusions to her head and no tenderness to palpation of her cervical spine nor pain or tenderness when she flexes, extends, and rotates her head and neck from side-to-side. CV:  Good peripheral perfusion.  Regular rate and rhythm. Resp:  Normal effort. Speaking easily and comfortably, no accessory muscle usage nor intercostal retractions.   Abd:  No distention.  Other:  Patient is holding her left arm in an adducted position close to her body in a sling and has significant pain with any amount of manipulation.  She does not want me to further reveal the shoulder or arm at this time until she gets pain medication, though her behavior is consistent with shoulder or humerus injury.   ED Results / Procedures / Treatments   Labs (all labs ordered  are listed, but only abnormal results are displayed) Labs Reviewed - No data to display   RADIOLOGY See ED course details   PROCEDURES:  Critical Care performed: No  Procedures    IMPRESSION / MDM / ASSESSMENT AND PLAN / ED COURSE  I reviewed the triage vital signs and the nursing notes.                              Differential diagnosis includes, but is not limited to, fracture, dislocation, clavicular injury, humerus fracture, intracranial bleed, cervical spine injury.  Patient's presentation is most consistent with acute presentation with potential threat to life or bodily function.  Labs/studies ordered: CT head, CT cervical spine, shoulder x-rays, humerus x-rays (both on the left side)  Interventions/Medications given:  Medications  morphine (PF) 4 MG/ML injection 4 mg (4 mg Intravenous Given 05/23/24 2340)  ondansetron  (ZOFRAN ) injection 4 mg (4 mg Intravenous Given 05/23/24 2340)  HYDROcodone-acetaminophen  (NORCO/VICODIN) 5-325 MG per tablet 2 tablet (2 tablets Oral Given  05/24/24 0144)    (Note:  hospital course my include additional interventions and/or labs/studies not listed above.)   Morphine 4 mg IV and Zofran  4 mg IV for pain control and nausea prevention.  Patient is awake and alert and I have a low suspicion for intracranial hemorrhage but given the obvious head trauma and the patient's long-term anticoagulation on Eliquis , I will proceed with further assessment with CT head and CT cervical spine.  x-rays of shoulder and upper arm are pending.         FINAL CLINICAL IMPRESSION(S) / ED DIAGNOSES   Final diagnoses:  Fall, initial encounter  Minor head injury, initial encounter  Contusion of face, initial encounter  Facial hematoma, initial encounter  Closed displaced fracture of surgical neck of left humerus, unspecified fracture morphology, initial encounter     Rx / DC Orders   ED Discharge Orders          Ordered    HYDROcodone-acetaminophen  (NORCO/VICODIN) 5-325 MG tablet  Every 6 hours PRN        05/24/24 0143             Note:  This document was prepared using Dragon voice recognition software and may include unintentional dictation errors.   Lynnda Sas, MD 05/24/24 [phone]

## 2024-05-23 NOTE — ED Triage Notes (Addendum)
 Pt to triage via w/c with no distress noted; reports PTA was walking in her bedroom and fell hitting her head; c/o pain to left upper arm/shoulder, hematoma to left temple; denies LOC

## 2024-05-24 ENCOUNTER — Telehealth: Payer: Self-pay

## 2024-05-24 ENCOUNTER — Telehealth: Payer: Self-pay | Admitting: Student

## 2024-05-24 ENCOUNTER — Other Ambulatory Visit (HOSPITAL_COMMUNITY): Payer: Self-pay

## 2024-05-24 MED ORDER — HYDROCODONE-ACETAMINOPHEN 5-325 MG PO TABS
2.0000 | ORAL_TABLET | Freq: Once | ORAL | Status: AC
  Administered 2024-05-24: 2 via ORAL
  Filled 2024-05-24: qty 2

## 2024-05-24 MED ORDER — METOPROLOL SUCCINATE ER 25 MG PO TB24
12.5000 mg | ORAL_TABLET | Freq: Every day | ORAL | 3 refills | Status: DC
  Filled 2024-05-24: qty 30, 60d supply, fill #0

## 2024-05-24 MED ORDER — HYDROCODONE-ACETAMINOPHEN 5-325 MG PO TABS: 1.0000 | ORAL_TABLET | Freq: Four times a day (QID) | ORAL | 0 refills | Status: AC | PRN

## 2024-05-24 MED ORDER — METOPROLOL SUCCINATE ER 25 MG PO TB24: 12.5000 mg | ORAL_TABLET | Freq: Every day | ORAL | 3 refills | Status: DC

## 2024-05-24 NOTE — Discharge Instructions (Addendum)
 As we discussed, your fall resulted in a fracture to the surgical neck of your left humerus, which is the upper part of your left upper arm.  There is no immediate treatment for it.  You should wear the provided sling/shoulder immobilizer to reduce the burden on your arm and alleviate the pain.  Try to wear the sling as much as possible.  You can use over-the-counter pain medication, and Take Norco as prescribed for severe pain. Do not drink alcohol, drive or participate in any other potentially dangerous activities while taking this medication as it may make you sleepy. Do not take this medication with any other sedating medications, either prescription or over-the-counter. If you were prescribed Percocet or Vicodin, do not take these with acetaminophen  (Tylenol ) as it is already contained within these medications.   This medication is an opiate (or narcotic) pain medication and can be habit forming.  Use it as little as possible to achieve adequate pain control.  Do not use or use it with extreme caution if you have a history of opiate abuse or dependence.  If you are on a pain contract with your primary care doctor or a pain specialist, be sure to let them know you were prescribed this medication today from the [facility] Emergency Department.  This medication is intended for your use only - do not give any to anyone else and keep it in a secure place where nobody else, especially children, have access to it.  It will also cause or worsen constipation, so you may want to consider taking an over-the-counter stool softener while you are taking this medication.

## 2024-05-24 NOTE — Telephone Encounter (Signed)
 Pt called in stating she was supposed to start a new medication last week however it was never sent to the pharmacy. Please advise. She asked for a call once it is sent.

## 2024-05-24 NOTE — Telephone Encounter (Signed)
 Spoke with patients daughter. They will wait for ortho to call them for an appointment for now. Did suggest going [facility].

## 2024-05-24 NOTE — Telephone Encounter (Signed)
 Called and spoke with patient. Per patient's last office visit she was supposed to be started on Metoprolol  Succinate 12.5 MG but was not sent to pharmacy. Prescription sent to preferred pharmacy.

## 2024-05-24 NOTE — Telephone Encounter (Signed)
 Copied from CRM [id number]. Topic: Clinical - Medical Advice >> May 24, 2024  1:44 PM Baldemar Lev wrote: Reason for CRM: Pt's daughter Tanis Fan is concerned with the patient's broken arm. Tanis Fan wants to speak to either the PCP or the clinic regarding these concerns.   Best contact: [phone]

## 2024-05-24 NOTE — Telephone Encounter (Signed)
 Please let them know that I reviewed the ED note and her X-rays. For concerns with acute fracture - she will need Orthopedics. I don't set fractures here.  There are a few options.  The ED usually will either refer to Orthpedics and schedule apt I can place a referral to Orthopedics if it was not done already. Again I don't see a referral in place. If she is experiencing pain and cannot wait for a referral - another option is [facility]. They can go today, 9am to 9pm for walk in hours without a referral.  [facility] Address: [address] [place], [place], [place] [address] Phone: [phone] Fax: [phone]  Let me know what they prefer.  Domingo Friend, DO [facility] 05/24/2024, 2:44 PM

## 2024-05-26 ENCOUNTER — Telehealth: Payer: Self-pay

## 2024-05-26 NOTE — Telephone Encounter (Signed)
 Yes, these medications can be taken together

## 2024-05-26 NOTE — Telephone Encounter (Signed)
 Copied from CRM [id number]. Topic: Clinical - Medication Question >> May 26, 2024 10:51 AM Phil Braun wrote: Reason for CRM:   Ref Hydrocodone and Prozac   Can they be taken together?   Please give her son a call back, Jeanann Midget, [phone].

## 2024-05-26 NOTE — Telephone Encounter (Signed)
 Spoke with Stacey Duncan notified medications can be taken together. Verbal understanding

## 2024-06-12 ENCOUNTER — Other Ambulatory Visit: Payer: Self-pay | Admitting: Physician Assistant

## 2024-06-14 ENCOUNTER — Other Ambulatory Visit: Payer: Self-pay | Admitting: Family Medicine

## 2024-06-14 DIAGNOSIS — M797 Fibromyalgia: Secondary | ICD-10-CM

## 2024-06-14 DIAGNOSIS — M150 Primary generalized (osteo)arthritis: Secondary | ICD-10-CM

## 2024-06-16 NOTE — Telephone Encounter (Signed)
 Requested Prescriptions  Pending Prescriptions Disp Refills   gabapentin  (NEURONTIN ) 300 MG capsule [Pharmacy Med Name: Gabapentin  Oral Capsule 300 MG] 270 capsule 0    Sig: TAKE 1 CAPSULE BY MOUTH IN THE MORNING AND 2 CAPSULES IN THE EVENING     Neurology: Anticonvulsants - gabapentin  Passed - 06/16/2024  2:38 PM      Passed - Cr in normal range and within 360 days    Creat  Date Value Ref Range Status  03/15/2023 0.80 0.60 - 0.95 mg/dL Final   Creatinine, Ser  Date Value Ref Range Status  05/18/2024 0.77 0.57 - 1.00 mg/dL Final         Passed - Completed PHQ-2 or PHQ-9 in the last 360 days      Passed - Valid encounter within last 12 months    Recent Outpatient Visits           4 months ago Acute cystitis with hematuria   [place] [facility] [place], Kayleen Party, DO

## 2024-06-19 NOTE — Patient Outreach (Signed)
 Attempted outreach with Stacey Duncan today to reestablish care with Complex Care Management team. Prefers to to wait to establish outreach due to pending update for Physical Therapy appointments. Reports she should have the schedule within the next week. Contact information provided. Ms. Whitelock agreed to call if she requires urgent assistance.   PLAN Ms. Valiente anticipates being available  to complete outreach in July Will follow up within the next week to confirm availability.    Jackson Acron [facility] Population Health RN Care Manager Direct Dial: [phone]  Fax: [phone] Website: [email or web address]

## 2024-06-25 ENCOUNTER — Other Ambulatory Visit: Payer: Self-pay | Admitting: Family Medicine

## 2024-06-25 DIAGNOSIS — F339 Major depressive disorder, recurrent, unspecified: Secondary | ICD-10-CM

## 2024-06-25 DIAGNOSIS — F411 Generalized anxiety disorder: Secondary | ICD-10-CM

## 2024-06-25 DIAGNOSIS — E032 Hypothyroidism due to medicaments and other exogenous substances: Secondary | ICD-10-CM

## 2024-06-27 NOTE — Telephone Encounter (Signed)
 Requested Prescriptions  Pending Prescriptions Disp Refills   FLUoxetine  (PROZAC ) 40 MG capsule [Pharmacy Med Name: FLUoxetine  HCl Oral Capsule 40 MG] 90 capsule 0    Sig: TAKE 1 CAPSULE EVERY DAY     Psychiatry:  Antidepressants - SSRI Passed - 06/27/2024  1:23 PM      Passed - Completed PHQ-2 or PHQ-9 in the last 360 days      Passed - Valid encounter within last 6 months    Recent Outpatient Visits           4 months ago Acute cystitis with hematuria   [place] [facility] [place], Marsa PARAS, DO               levothyroxine  (SYNTHROID ) 100 MCG tablet [Pharmacy Med Name: Levothyroxine  Sodium Oral Tablet 100 MCG] 90 tablet 3    Sig: TAKE 1 TABLET EVERY DAY BEFORE BREAKFAST     Endocrinology:  Hypothyroid Agents Passed - 06/27/2024  1:23 PM      Passed - TSH in normal range and within 360 days    TSH  Date Value Ref Range Status  05/18/2024 0.543 0.450 - 4.500 uIU/mL Final         Passed - Valid encounter within last 12 months    Recent Outpatient Visits           4 months ago Acute cystitis with hematuria   [place] [facility] [place], Marsa PARAS, [place]

## 2024-07-03 ENCOUNTER — Ambulatory Visit: Payer: Self-pay

## 2024-07-03 NOTE — Telephone Encounter (Signed)
  FYI Only or Action Required?: FYI only for provider.  Patient was last seen in primary care on 02/07/2024 by Edman Marsa PARAS, DO. Called Nurse Triage reporting Joint Swelling. Symptoms began 3 days. Interventions attempted: Rest, hydration, or home remedies. Symptoms are: unchanged.  Triage Disposition: See HCP Within 4 Hours (Or PCP Triage)  Patient/caregiver understands and will follow disposition?:  Yes---Patient was advised Urgent Care & patient states she is going to call [facility] first.                           Copied from CRM [id number]. Topic: Clinical - Red Word Triage >> Jul 03, 2024 10:35 AM Charlet HERO wrote: Red Word that prompted transfer to Nurse Triage: Patient is calling about feet and ankles being swollen she has numbness and tingling. Reason for Disposition  [1] Thigh, calf, or ankle swelling AND [2] bilateral AND [3] 1 side is more swollen  Answer Assessment - Initial Assessment Questions 1. LOCATION: Which ankle is swollen? Where is the swelling?     Both ankles and feet---better with elevation all weekend---Left swollen a little more than right 2. ONSET: When did the swelling start?     3 days 3. SWELLING: How bad is the swelling? Or, How large is it? (e.g., mild, moderate, severe; size of localized swelling)    - NONE: No joint swelling.   - LOCALIZED: Localized; small area of puffy or swollen skin (e.g., insect bite, skin irritation).   - MILD: Joint looks or feels mildly swollen or puffy.   - MODERATE: Swollen; interferes with normal activities (e.g., work or school); decreased range of movement; may be limping.   - SEVERE: Very swollen; can't move swollen joint at all; limping a lot or unable to walk.     mild 4. PAIN: Is there any pain? If Yes, ask: How bad is it? (Scale 1-10; or mild, moderate, severe)   - NONE (0): no pain.   - MILD (1-3): doesn't interfere with normal activities.    - MODERATE (4-7): interferes  with normal activities (e.g., work or school) or awakens from sleep, limping.    - SEVERE (8-10): excruciating pain, unable to do any normal activities, unable to walk.      NONE 5. CAUSE: What do you think caused the ankle swelling?     unknown 6. OTHER SYMPTOMS: Do you have any other symptoms? (e.g., fever, chest pain, difficulty breathing, calf pain)     Some numbness in feet/ankles (history of neuropathy & arthritis)---Pain behind right knee when getting up or sitting down x 1 month Patient walks with a cane   No appointments available in PCP office. Patient is advised Urgent Care is an option. Patient verbalized understanding and states that she is going to call [facility] next to see if they can see her. Patient is advised that if anything gets worse to go to the Emergency Room or call 911 for an ambulance to take her to the Emergency Room. Patient verbalized understanding.  Protocols used: Ankle Swelling-A-AH

## 2024-07-21 ENCOUNTER — Telehealth: Payer: Self-pay

## 2024-08-08 DIAGNOSIS — R600 Localized edema: Secondary | ICD-10-CM | POA: Diagnosis not present

## 2024-08-08 DIAGNOSIS — M25512 Pain in left shoulder: Secondary | ICD-10-CM | POA: Diagnosis not present

## 2024-08-10 ENCOUNTER — Other Ambulatory Visit: Payer: Self-pay | Admitting: Family Medicine

## 2024-08-10 DIAGNOSIS — G6289 Other specified polyneuropathies: Secondary | ICD-10-CM

## 2024-08-15 ENCOUNTER — Telehealth: Payer: Self-pay

## 2024-08-15 NOTE — Telephone Encounter (Signed)
 Yes she told me about this request when she was here for her husband's appointment.  I sent her referral as requested to her preferred location in [place].  [facility]   Address: [address] [place], [place] [address] Phone: [phone]  It looks like it is in the process of being scheduled. If there is any delay they will contact us  and explain if we need anything else done before referring, otherwise they should schedule and contact her.  Marsa Officer, DO [facility] 08/15/2024, 4:53 PM

## 2024-08-15 NOTE — Telephone Encounter (Signed)
 Copied from CRM [id number]. Topic: Referral - Question >> Aug 15, 2024 10:33 AM DeAngela L wrote: Reason for CRM: Patient is calling to ask if she needs to schedule an appt and come into the office for a visit?  if she wants a referral for a neurologist that is located in [place], her son has seen this neurologist before referred by Dr Edman and she would like to see the same provider  Patient states Dr MARLA is aware of her concern so she was calling to see what steps she needs for a referral  Pt num [phone] (H)

## 2024-08-16 NOTE — Telephone Encounter (Signed)
 Spoke with patient, she has received an appointment its on 01/14/25 she is on the cancellation list. She wanted me to let you know.

## 2024-08-17 DIAGNOSIS — S42292A Other displaced fracture of upper end of left humerus, initial encounter for closed fracture: Secondary | ICD-10-CM | POA: Diagnosis not present

## 2024-08-17 DIAGNOSIS — M160 Bilateral primary osteoarthritis of hip: Secondary | ICD-10-CM | POA: Diagnosis not present

## 2024-08-21 ENCOUNTER — Telehealth: Payer: Self-pay

## 2024-08-25 ENCOUNTER — Other Ambulatory Visit: Payer: Self-pay | Admitting: Family Medicine

## 2024-08-25 DIAGNOSIS — F339 Major depressive disorder, recurrent, unspecified: Secondary | ICD-10-CM

## 2024-08-25 DIAGNOSIS — F5104 Psychophysiologic insomnia: Secondary | ICD-10-CM

## 2024-08-25 DIAGNOSIS — M150 Primary generalized (osteo)arthritis: Secondary | ICD-10-CM

## 2024-08-25 DIAGNOSIS — M797 Fibromyalgia: Secondary | ICD-10-CM

## 2024-08-26 ENCOUNTER — Other Ambulatory Visit: Payer: Self-pay | Admitting: Cardiovascular Disease

## 2024-08-26 DIAGNOSIS — I4891 Unspecified atrial fibrillation: Secondary | ICD-10-CM

## 2024-08-28 NOTE — Telephone Encounter (Signed)
 Requested Prescriptions  Pending Prescriptions Disp Refills   gabapentin  (NEURONTIN ) 300 MG capsule [Pharmacy Med Name: GABAPENTIN  300 MG Oral Capsule] 270 capsule 1    Sig: TAKE 1 CAPSULE BY MOUTH IN THE MORNING AND 2 CAPSULES IN THE EVENING     Neurology: Anticonvulsants - gabapentin  Passed - 08/28/2024  7:18 AM      Passed - Cr in normal range and within 360 days    Creat  Date Value Ref Range Status  03/15/2023 0.80 0.60 - 0.95 mg/dL Final   Creatinine, Ser  Date Value Ref Range Status  05/18/2024 0.77 0.57 - 1.00 mg/dL Final         Passed - Completed PHQ-2 or PHQ-9 in the last 360 days      Passed - Valid encounter within last 12 months    Recent Outpatient Visits           6 months ago Acute cystitis with hematuria   [facility], Marsa PARAS, DO              Refused Prescriptions Disp Refills   traZODone  (DESYREL ) 50 MG tablet [Pharmacy Med Name: TRAZODONE  HYDROCHLORIDE 50 MG Oral Tablet] 90 tablet 3    Sig: TAKE 1/2 TO 1 TABLET AT BEDTIME     Psychiatry: Antidepressants - Serotonin Modulator Failed - 08/28/2024  7:18 AM      Failed - Valid encounter within last 6 months    Recent Outpatient Visits           6 months ago Acute cystitis with hematuria   [facility], Marsa PARAS, DO              Passed - Completed PHQ-2 or PHQ-9 in the last 360 days

## 2024-08-31 ENCOUNTER — Ambulatory Visit: Payer: Medicare PPO

## 2024-08-31 VITALS — Ht 63.0 in | Wt 147.0 lb

## 2024-08-31 DIAGNOSIS — Z0000 Encounter for general adult medical examination without abnormal findings: Secondary | ICD-10-CM

## 2024-08-31 NOTE — Patient Instructions (Addendum)
 Ms. Krikorian , Thank you for taking time out of your busy schedule to complete your Annual Wellness Visit with me. I enjoyed our conversation and look forward to speaking with you again next year. I, as well as your care team,  appreciate your ongoing commitment to your health goals. Please review the following plan we discussed and let me know if I can assist you in the future. Your Game plan/ To Do List    Referrals: If you haven't heard from the office you've been referred to, please reach out to them at the phone provided.   Follow up Visits: We will see or speak with you next year for your Next Medicare AWV with our clinical staff 09/14/25 10:50a  Have you seen your provider in the last 6 months (3 months if uncontrolled diabetes)?   Clinician Recommendations:  Aim for 30 minutes of exercise or brisk walking, 6-8 glasses of water, and 5 servings of fruits and vegetables each day.       This is a list of the screenings recommended for you:  Health Maintenance  Topic Date Due   DTaP/Tdap/Td vaccine (1 - Tdap) Never done   Zoster (Shingles) Vaccine (1 of 2) Never done   Pneumococcal Vaccine for age over 96 (1 of 1 - PCV) Never done   Flu Shot  07/28/2024   COVID-19 Vaccine (4 - 2025-26 season) 08/28/2024   Medicare Annual Wellness Visit  08/31/2025   DEXA scan (bone density measurement)  Completed   HPV Vaccine  Aged Out   Meningitis B Vaccine  Aged Out   Opioid Pain Medicine Management Opioids are powerful medicines that are used to treat moderate to severe pain. When used for short periods of time, they can help you to: Sleep better. Do better in physical or occupational therapy. Feel better in the first few days after an injury. Recover from surgery. Opioids should be taken with the supervision of a trained health care provider. They should be taken for the shortest period of time possible. This is because opioids can be addictive, and the longer you take opioids, the greater your  risk of addiction. This addiction can also be called opioid use disorder. What are the risks? Using opioid pain medicines for longer than 3 days increases your risk of side effects. Side effects include: Constipation. Nausea and vomiting. Breathing difficulties (respiratory depression). Drowsiness. Confusion. Opioid use disorder. Itching. Taking opioid pain medicine for a long period of time can affect your ability to do daily tasks. It also puts you at risk for: Motor vehicle crashes. Depression. Suicide. Heart attack. Overdose, which can be life-threatening. What is a pain treatment plan? A pain treatment plan is an agreement between you and your health care provider. Pain is unique to each person, and treatments vary depending on your condition. To manage your pain, you and your health care provider need to work together. To help you do this: Discuss the goals of your treatment, including how much pain you might expect to have and how you will manage the pain. Review the risks and benefits of taking opioid medicines. Remember that a good treatment plan uses more than one approach and minimizes the chance of side effects. Be honest about the amount of medicines you take and about any drug or alcohol use. Get pain medicine prescriptions from only one health care provider. Pain can be managed with many types of alternative treatments. Ask your health care provider to refer you to one or more  specialists who can help you manage pain through: Physical or occupational therapy. Counseling (cognitive behavioral therapy). Good nutrition. Biofeedback. Massage. Meditation. Non-opioid medicine. Following a gentle exercise program. How to use opioid pain medicine Taking medicine Take your pain medicine exactly as told by your health care provider. Take it only when you need it. If your pain gets less severe, you may take less than your prescribed dose if your health care provider  approves. If you are not having pain, do nottake pain medicine unless your health care provider tells you to take it. If your pain is severe, do nottry to treat it yourself by taking more pills than instructed on your prescription. Contact your health care provider for help. Write down the times when you take your pain medicine. It is easy to become confused while on pain medicine. Writing the time can help you avoid overdose. Take other over-the-counter or prescription medicines only as told by your health care provider. Keeping yourself and others safe  While you are taking opioid pain medicine: Do not drive, use machinery, or power tools. Do not sign legal documents. Do not drink alcohol. Do not take sleeping pills. Do not supervise children by yourself. Do not do activities that require climbing or being in high places. Do not go to a lake, river, ocean, spa, or swimming pool. Do not share your pain medicine with anyone. Keep pain medicine in a locked cabinet or in a secure area where pets and children cannot reach it. Stopping your use of opioids If you have been taking opioid medicine for more than a few weeks, you may need to slowly decrease (taper) how much you take until you stop completely. Tapering your use of opioids can decrease your risk of symptoms of withdrawal, such as: Pain and cramping in the abdomen. Nausea. Sweating. Sleepiness. Restlessness. Uncontrollable shaking (tremors). Cravings for the medicine. Do not attempt to taper your use of opioids on your own. Talk with your health care provider about how to do this. Your health care provider may prescribe a step-down schedule based on how much medicine you are taking and how long you have been taking it. Getting rid of leftover pills Do not save any leftover pills. Get rid of leftover pills safely by: Taking the medicine to a prescription take-back program. This is usually offered by the county or law  enforcement. Bringing them to a pharmacy that has a drug disposal container. Flushing them down the toilet. Check the label or package insert of your medicine to see whether this is safe to do. Throwing them out in the trash. Check the label or package insert of your medicine to see whether this is safe to do. If it is safe to throw it out, remove the medicine from the original container, put it into a sealable bag or container, and mix it with used coffee grounds, food scraps, dirt, or cat litter before putting it in the trash. Follow these instructions at home: Activity Do exercises as told by your health care provider. Avoid activities that make your pain worse. Return to your normal activities as told by your health care provider. Ask your health care provider what activities are safe for you. General instructions You may need to take these actions to prevent or treat constipation: Drink enough fluid to keep your urine pale yellow. Take over-the-counter or prescription medicines. Eat foods that are high in fiber, such as beans, whole grains, and fresh fruits and vegetables. Limit foods that are high  in fat and processed sugars, such as fried or sweet foods. Keep all follow-up visits. This is important. Where to find support If you have been taking opioids for a long time, you may benefit from receiving support for quitting from a local support group or counselor. Ask your health care provider for a referral to these resources in your area. Where to find more information Centers for Disease Control and Prevention (CDC): FootballExhibition.com.br [place] Food and Drug Administration (FDA): PumpkinSearch.com.ee Get help right away if: You may have taken too much of an opioid (overdosed). Common symptoms of an overdose: Your breathing is slower or more shallow than normal. You have a very slow heartbeat (pulse). You have slurred speech. You have nausea and vomiting. Your pupils become very small. You have other  potential symptoms: You are very confused. You faint or feel like you will faint. You have cold, clammy skin. You have blue lips or fingernails. You have thoughts of harming yourself or harming others. These symptoms may represent a serious problem that is an emergency. Do not wait to see if the symptoms will go away. Get medical help right away. Call your local emergency services (911 in the [place] Do not drive yourself to the hospital.  If you ever feel like you may hurt yourself or others, or have thoughts about taking your own life, get help right away. Go to your nearest emergency department or: Call your local emergency services (911 in the [place] Call the [facility] [phone] 449-8929 in the [place] Call a suicide crisis helpline, such as the National Suicide Prevention Lifeline at 845-853-1011 or 988 in the [place] This is open 24 hours a day in the [place] If you're a Veteran: Call 988 and press 1. This is open 24 hours a day. Text the [employer] at 9784346678. Summary Opioid medicines can help you manage moderate to severe pain for a short period of time. A pain treatment plan is an agreement between you and your health care provider. Discuss the goals of your treatment, including how much pain you might expect to have and how you will manage the pain. If you think that you or someone else may have taken too much of an opioid, get medical help right away. This information is not intended to replace advice given to you by your health care provider. Make sure you discuss any questions you have with your health care provider. Document Revised: 09/20/2023 Document Reviewed: 03/26/2021 Elsevier Patient Education  2024 [employer]vier Inc. Advanced directives: (Copy Requested) Please bring a copy of your health care power of attorney and living will to the office to be added to your chart at your convenience. You can mail to [facility] [address]  [place], [place] [address] or email to [email or web address] Advance Care Planning is important because it:  [x]  Makes sure you receive the medical care that is consistent with your values, goals, and preferences  [x]  It provides guidance to your family and loved ones and reduces their decisional burden about whether or not they are making the right decisions based on your wishes.  Follow the link provided in your after visit summary or read over the paperwork we have mailed to you to help you started getting your Advance Directives in place. If you need assistance in completing these, please reach out to us  so that we can help you!  See attachments for Preventive Care and Fall Prevention Tips.

## 2024-08-31 NOTE — Progress Notes (Signed)
 Subjective:   Stacey Duncan is a 89 y.o. who presents for a Medicare Wellness preventive visit.  As a reminder, Annual Wellness Visits don't include a physical exam, and some assessments may be limited, especially if this visit is performed virtually. We may recommend an in-person follow-up visit with your provider if needed.  Visit Complete: Virtual I connected with  Stacey Duncan on 08/31/24 by a audio enabled telemedicine application and verified that I am speaking with the correct person using two identifiers.  Patient Location: Home  Provider Location: Home Office  I discussed the limitations of evaluation and management by telemedicine. The patient expressed understanding and agreed to proceed.  Vital Signs: Because this visit was a virtual/telehealth visit, some criteria may be missing or patient reported. Any vitals not documented were not able to be obtained and vitals that have been documented are patient reported.    Persons Participating in Visit: Patient.  AWV Questionnaire: No: Patient Medicare AWV questionnaire was not completed prior to this visit.  Cardiac Risk Factors include: advanced age (>19men, >79 women);hypertension     Objective:    Today's Vitals   08/31/24 1046  Weight: 147 lb (66.7 kg)  Height: 5' 3 (1.6 m)   Body mass index is 26.04 kg/m.     08/31/2024   11:03 AM 05/23/2024   11:22 PM 08/26/2023   11:05 AM 03/18/2021    2:48 PM 08/08/2019    2:24 PM 07/12/2018    2:28 PM 05/27/2018   12:29 PM  Advanced Directives  Does Patient Have a Medical Advance Directive? Yes No No No No No  No   Type of Estate agent of [place];Living will        Copy of Healthcare Power of Attorney in Chart? No - copy requested        Would patient like information on creating a medical advance directive?  No - Patient declined No - Patient declined   Yes (MAU/Ambulatory/Procedural Areas - Information given)       Data saved with a  previous flowsheet row definition    Current Medications (verified) Outpatient Encounter Medications as of 08/31/2024  Medication Sig   atorvastatin  (LIPITOR) 80 MG tablet TAKE 1 TABLET BY MOUTH ONCE DAILY AT  6PM   Cholecalciferol  25 MCG (1000 UT) tablet Take 1,000 Units by mouth daily.    clobetasol  cream (TEMOVATE ) 0.05 % Apply 1 application topically 2 (two) times daily as needed (skin irritation).   ELIQUIS  5 MG TABS tablet TAKE 1 TABLET TWICE DAILY   FLUoxetine  (PROZAC ) 40 MG capsule TAKE 1 CAPSULE EVERY DAY   furosemide  (LASIX ) 20 MG tablet TAKE 1 TABLET EVERY DAY   gabapentin  (NEURONTIN ) 300 MG capsule TAKE 1 CAPSULE BY MOUTH IN THE MORNING AND 2 CAPSULES IN THE EVENING   HYDROcodone -acetaminophen  (NORCO/VICODIN) 5-325 MG tablet Take 1 tablet by mouth every 6 (six) hours as needed for moderate pain (pain score 4-6) or severe pain (pain score 7-10).   levothyroxine  (SYNTHROID ) 100 MCG tablet TAKE 1 TABLET EVERY DAY BEFORE BREAKFAST   metoprolol  succinate (TOPROL  XL) 25 MG 24 hr tablet Take 0.5 tablets (12.5 mg total) by mouth daily.   omeprazole  (PRILOSEC) 40 MG capsule Take 1 capsule (40 mg total) by mouth daily as needed.   tiZANidine  (ZANAFLEX ) 2 MG tablet Take 1 tablet (2 mg total) by mouth every 8 (eight) hours as needed for muscle spasms.   No facility-administered encounter medications on file as of 08/31/2024.  Allergies (verified) Sulfa antibiotics and Sulfacetamide sodium   History: Past Medical History:  Diagnosis Date   Anxiety    Fibromyalgia    GERD (gastroesophageal reflux disease)    History of echocardiogram    a. 02/2016 Echo: EF 60-65%, no rwma. Nl RV fxn and PASP.   HTN (hypertension)    Hypothyroidism    Mixed hyperlipidemia    Osteoarthritis    a. bilateral knees   PAF (paroxysmal atrial fibrillation) (HCC)    a. 02/2015 & 09/2017 - admit w/ Afib, briefly req Amio; b. CHA2DSVASc = 4-->Eliquis .   Recurrent UTI (urinary tract infection)    Sinus  bradycardia    a. Has limited beta blocker dosing.   Past Surgical History:  Procedure Laterality Date   ABDOMINAL HYSTERECTOMY     CHOLECYSTECTOMY     MOHS SURGERY Right    leg   ROTATOR CUFF REPAIR     left    Family History  Problem Relation Age of Onset   Heart disease Mother 29       CABG   Heart attack Maternal Grandfather    Heart attack Maternal Grandmother    Social History   Socioeconomic History   Marital status: Married    Spouse name: Not on file   Number of children: Not on file   Years of education: Not on file   Highest education level: Bachelor's degree (e.g., BA, AB, BS)  Occupational History   Occupation: retired  Tobacco Use   Smoking status: Never   Smokeless tobacco: Never  Vaping Use   Vaping status: Never Used  Substance and Sexual Activity   Alcohol use: Yes    Alcohol/week: 7.0 standard drinks of alcohol    Types: 7 Glasses of wine per week    Comment: glass of wine daily   Drug use: No   Sexual activity: Not Currently    Birth control/protection: Post-menopausal  Other Topics Concern   Not on file  Social History Narrative   Not on file   Social Drivers of Health   Financial Resource Strain: Low Risk  (08/26/2023)   Overall Financial Resource Strain (CARDIA)    Difficulty of Paying Living Expenses: Not hard at all  Food Insecurity: No Food Insecurity (08/31/2024)   Hunger Vital Sign    Worried About Running Out of Food in the Last Year: Never true    Ran Out of Food in the Last Year: Never true  Transportation Needs: No Transportation Needs (08/31/2024)   PRAPARE - Administrator, Civil Service (Medical): No    Lack of Transportation (Non-Medical): No  Physical Activity: Inactive (08/31/2024)   Exercise Vital Sign    Days of Exercise per Week: 0 days    Minutes of Exercise per Session: 0 min  Stress: Stress Concern Present (08/31/2024)   [employer] of Occupational Health - Occupational Stress Questionnaire     Feeling of Stress: To some extent  Social Connections: Socially Integrated (08/31/2024)   Social Connection and Isolation Panel    Frequency of Communication with Friends and Family: More than three times a week    Frequency of Social Gatherings with Friends and Family: More than three times a week    Attends Religious Services: More than 4 times per year    Active Member of [employer] or Organizations: Yes    Attends Engineer, structural: More than 4 times per year    Marital Status: Married    Tobacco Counseling Counseling  given: Not Answered    Clinical Intake:  Pre-visit preparation completed: Yes  Pain : No/denies pain     BMI - recorded: 26.04 Nutritional Status: BMI 25 -29 Overweight Nutritional Risks: None Diabetes: No  Lab Results  Component Value Date   HGBA1C 5.9 (H) 03/15/2023   HGBA1C 5.8 (H) 07/16/2021   HGBA1C 5.9 (H) 01/08/2021     How often do you need to have someone help you when you read instructions, pamphlets, or other written materials from your doctor or pharmacy?: 1 - Never  Interpreter Needed?: No  Information entered by :: Rojelio Blush LPN   Activities of Daily Living     08/31/2024   10:57 AM  In your present state of health, do you have any difficulty performing the following activities:  Hearing? 0  Vision? 0  Difficulty concentrating or making decisions? 0  Walking or climbing stairs? 1  Comment Uses a Estate manager/land agent or bathing? 0  Doing errands, shopping? 0  Preparing Food and eating ? N  Using the Toilet? N  In the past six months, have you accidently leaked urine? Y  Comment Wears Pads. Followed by PCP  Do you have problems with loss of bowel control? Y  Comment Dx: IBS  Managing your Medications? N  Managing your Finances? N  Housekeeping or managing your Housekeeping? N    Patient Care Team: Edman Marsa PARAS, DO as PCP - General (Family Medicine) Perla Evalene PARAS, MD as PCP - Cardiology  (Cardiology) Perla Evalene PARAS, MD as Consulting Physician (Cardiology) Twylla Glendia BROCKS, MD (Urology) Marchia Drivers, MD as Referring Physician (Orthopedic Surgery)  I have updated your Care Teams any recent Medical Services you may have received from other providers in the past year.     Assessment:   This is a routine wellness examination for Stacey Duncan.  Hearing/Vision screen Hearing Screening - Comments:: Denies hearing difficulties   Vision Screening - Comments:: Wears rx glasses - up to date with routine eye exams with  [facility]   Goals Addressed               This Visit's Progress     Increase physical activity (pt-stated)        Get a little more active.       Depression Screen     08/31/2024   10:55 AM 01/03/2024   11:41 AM 12/24/2023   11:27 AM 10/26/2023    6:12 PM 08/26/2023   11:03 AM 06/23/2023   10:56 AM 04/13/2023   11:21 AM  PHQ 2/9 Scores  PHQ - 2 Score 1 2 2 3 2 1 1   PHQ- 9 Score  8 8 9 4 5 6     Fall Risk     08/31/2024   11:01 AM 12/24/2023   11:27 AM 08/26/2023   11:06 AM 07/14/2023    1:21 PM 05/19/2023    1:27 PM  Fall Risk   Falls in the past year? 1 0 0 0 0  Number falls in past yr: 0  0 0 0  Injury with Fall? 1  0 0 0  Comment Fx Shoulder. Followed by medical attention      Risk for fall due to : Orthopedic patient  No Fall Risks Impaired balance/gait;Impaired mobility Impaired balance/gait;Impaired mobility  Follow up Falls evaluation completed  Falls prevention discussed Falls evaluation completed;Education provided;Falls prevention discussed Falls evaluation completed;Education provided;Falls prevention discussed    MEDICARE RISK AT HOME:  Medicare  Risk at Home Any stairs in or around the home?: Yes If so, are there any without handrails?: No Home free of loose throw rugs in walkways, pet beds, electrical cords, etc?: Yes Adequate lighting in your home to reduce risk of falls?: Yes Life alert?: No Use of a cane, walker or  w/c?: Yes Grab bars in the bathroom?: Yes Shower chair or bench in shower?: No Elevated toilet seat or a handicapped toilet?: No  TIMED UP AND GO:  Was the test performed?  No  Cognitive Function: 6CIT completed        08/31/2024   11:03 AM 08/26/2023   11:08 AM 03/18/2021    2:56 PM 07/12/2018    2:29 PM 07/20/2017    4:01 PM  6CIT Screen  What Year? 0 points  0 points 0 points 0 points  What month? 0 points  0 points 0 points 0 points  What time? 0 points 0 points 0 points 0 points 0 points  Count back from 20 0 points 0 points 0 points 0 points 0 points  Months in reverse 0 points 0 points 0 points 0 points 0 points  Repeat phrase 0 points 0 points 4 points 0 points 0 points  Total Score 0 points  4 points 0 points 0 points    Immunizations Immunization History  Administered Date(s) Administered   Fluad Quad(high Dose 65+) 10/24/2019, 09/25/2020, 10/23/2022   Fluad Trivalent(High Dose 65+) 10/26/2023   INFLUENZA, HIGH DOSE SEASONAL PF 09/30/2015, 10/22/2016, 10/20/2017, 10/26/2018, 11/02/2021   PFIZER(Purple Top)SARS-COV-2 Vaccination 01/04/2020, 01/25/2020, 12/11/2020    Screening Tests Health Maintenance  Topic Date Due   DTaP/Tdap/Td (1 - Tdap) Never done   Zoster Vaccines- Shingrix  (1 of 2) Never done   Pneumococcal Vaccine: 50+ Years (1 of 1 - PCV) Never done   INFLUENZA VACCINE  07/28/2024   COVID-19 Vaccine (4 - 2025-26 season) 08/28/2024   Medicare Annual Wellness (AWV)  08/31/2025   DEXA SCAN  Completed   HPV VACCINES  Aged Out   Meningococcal B Vaccine  Aged Out    Health Maintenance  Health Maintenance Due  Topic Date Due   DTaP/Tdap/Td (1 - Tdap) Never done   Zoster Vaccines- Shingrix  (1 of 2) Never done   Pneumococcal Vaccine: 50+ Years (1 of 1 - PCV) Never done   INFLUENZA VACCINE  07/28/2024   COVID-19 Vaccine (4 - 2025-26 season) 08/28/2024   Health Maintenance Items Addressed:   Additional Screening:  Vision Screening: Recommended  annual ophthalmology exams for early detection of glaucoma and other disorders of the eye. Would you like a referral to an eye doctor? No    Dental Screening: Recommended annual dental exams for proper oral hygiene  Community Resource Referral / Chronic Care Management: CRR required this visit?  No   CCM required this visit?  No   Plan:    I have personally reviewed and noted the following in the patient's chart:   Medical and social history Use of alcohol, tobacco or illicit drugs  Current medications and supplements including opioid prescriptions. Patient is currently taking opioid prescriptions. Information provided to patient regarding non-opioid alternatives. Patient advised to discuss non-opioid treatment plan with their provider. Functional ability and status Nutritional status Physical activity Advanced directives List of other physicians Hospitalizations, surgeries, and ER visits in previous 12 months Vitals Screenings to include cognitive, depression, and falls Referrals and appointments  In addition, I have reviewed and discussed with patient certain preventive protocols, quality metrics, and  best practice recommendations. A written personalized care plan for preventive services as well as general preventive health recommendations were provided to patient.   Rojelio LELON Blush, LPN   0/04/[id number]   After Visit Summary: (MyChart) Due to this being a telephonic visit, the after visit summary with patients personalized plan was offered to patient via MyChart   Notes: Nothing significant to report at this time.

## 2024-09-05 ENCOUNTER — Telehealth: Payer: Self-pay | Admitting: Cardiovascular Disease

## 2024-09-05 MED ORDER — METOPROLOL SUCCINATE ER 25 MG PO TB24: 12.5000 mg | ORAL_TABLET | Freq: Every day | ORAL | 2 refills | Status: AC

## 2024-09-05 NOTE — Telephone Encounter (Signed)
*  STAT* If patient is at the pharmacy, call can be transferred to refill team.   1. Which medications need to be refilled? (please list name of each medication and dose if known) metoprolol succinate (TOPROL-XL) 25 MG 24 hr tablet   2. Which pharmacy/location (including street and city if local pharmacy) is medication to be sent to? [facility] - [place], [place] - [address]    3. Do they need a 30 day or 90 day supply? 90   

## 2024-09-05 NOTE — Telephone Encounter (Signed)
 RX sent in

## 2024-09-08 ENCOUNTER — Other Ambulatory Visit: Payer: Self-pay | Admitting: Family Medicine

## 2024-09-08 DIAGNOSIS — F411 Generalized anxiety disorder: Secondary | ICD-10-CM

## 2024-09-08 DIAGNOSIS — F339 Major depressive disorder, recurrent, unspecified: Secondary | ICD-10-CM

## 2024-09-08 NOTE — Telephone Encounter (Signed)
 Requested Prescriptions  Pending Prescriptions Disp Refills   FLUoxetine  (PROZAC ) 40 MG capsule [Pharmacy Med Name: FLUOXETINE  HYDROCHLORIDE 40 MG Oral Capsule] 90 capsule 0    Sig: TAKE 1 CAPSULE EVERY DAY     Psychiatry:  Antidepressants - SSRI Passed - 09/08/2024  3:30 PM      Passed - Completed PHQ-2 or PHQ-9 in the last 360 days      Passed - Valid encounter within last 6 months    Recent Outpatient Visits           7 months ago Acute cystitis with hematuria   [place] [facility] [place], Marsa PARAS, [place]

## 2024-09-12 ENCOUNTER — Other Ambulatory Visit: Payer: Self-pay

## 2024-09-12 NOTE — Patient Outreach (Unsigned)
 Complex Care Management   Visit Note  09/12/2024  Name:  Stacey Duncan MRN: [id number] DOB: Feb 13, 1931  Situation: Referral received for Complex Care Management related to {Criteria:32550} I obtained verbal consent from {CHL AMB Patient/Caregiver:28184}.  Visit completed with {CHL AMB Patient/Caregiver:28184}  {VISIT LOCATION:32553}  Background:   Past Medical History:  Diagnosis Date   Anxiety    Fibromyalgia    GERD (gastroesophageal reflux disease)    History of echocardiogram    a. 02/2016 Echo: EF 60-65%, no rwma. Nl RV fxn and PASP.   HTN (hypertension)    Hypothyroidism    Mixed hyperlipidemia    Osteoarthritis    a. bilateral knees   PAF (paroxysmal atrial fibrillation) (HCC)    a. 02/2015 & 09/2017 - admit w/ Afib, briefly req Amio; b. CHA2DSVASc = 4-->Eliquis .   Recurrent UTI (urinary tract infection)    Sinus bradycardia    a. Has limited beta blocker dosing.    Assessment: Patient Reported Symptoms:  Cognitive Cognitive Status: Alert and oriented to person, place, and time, Normal speech and language skills Cognitive/Intellectual Conditions Management [RPT]: None reported or documented in medical history or problem list   Health Maintenance Behaviors: Annual physical exam, Healthy diet, Hobbies, Spiritual practice(s), Social activities, Stress management Healing Pattern: Average Health Facilitated by: Rest, Stress management, Prayer/meditation  Neurological Neurological Review of Symptoms: Numbness, Other: [place] Neurological Symptoms/Conditions [RPT]: Reports neuropathy in both lower extremities Neurological Management Strategies: Routine screening  HEENT        Cardiovascular      Respiratory      Endocrine      Gastrointestinal        Genitourinary      Integumentary      Musculoskeletal          Psychosocial            09/12/2024    PHQ2-9 Depression Screening   Little interest or pleasure in doing things Not at all  Feeling down,  depressed, or hopeless Several days  PHQ-2 - Total Score 1  Trouble falling or staying asleep, or sleeping too much    Feeling tired or having little energy    Poor appetite or overeating     Feeling bad about yourself - or that you are a failure or have let yourself or your family down    Trouble concentrating on things, such as reading the newspaper or watching television    Moving or speaking so slowly that other people could have noticed.  Or the opposite - being so fidgety or restless that you have been moving around a lot more than usual    Thoughts that you would be better off dead, or hurting yourself in some way    PHQ2-9 Total Score    If you checked off any problems, how difficult have these problems made it for you to do your work, take care of things at home, or get along with other people    Depression Interventions/Treatment      There were no vitals filed for this visit.  Medications Reviewed Today     Reviewed by Karoline Lima, RN (Registered Nurse) on 09/12/24 at 1140  Med List Status: <None>   Medication Order Taking? Sig Documenting Provider Last Dose Status Informant  atorvastatin  (LIPITOR) 80 MG tablet [id number] No TAKE 1 TABLET BY MOUTH ONCE DAILY AT  6PM Gollan, Timothy J, MD Taking Active   Cholecalciferol  25 MCG (1000 UT) tablet [id number] No Take 1,000 Units  by mouth daily.  [provider] Taking Active Self           Med Note (HARDEN, SUMMER D   Fri Mar 13, 2016  8:48 AM)    clobetasol  cream (TEMOVATE ) 0.05 % [id number] No Apply 1 application topically 2 (two) times daily as needed (skin irritation). Edman Marsa PARAS, DO Taking Active   ELIQUIS  5 MG TABS tablet [id number] No TAKE 1 TABLET TWICE DAILY Gollan, Timothy J, MD Taking Active   FLUoxetine  (PROZAC ) 40 MG capsule [id number]  TAKE 1 CAPSULE EVERY DAY Edman Marsa PARAS, DO  Active   furosemide  (LASIX ) 20 MG tablet [id number]  TAKE 1 TABLET EVERY DAY Gollan, Timothy J, MD  Active    gabapentin  (NEURONTIN ) 300 MG capsule [id number]  TAKE 1 CAPSULE BY MOUTH IN THE MORNING AND 2 CAPSULES IN THE EVENING Karamalegos, Marsa PARAS, DO  Active   HYDROcodone -acetaminophen  (NORCO/VICODIN) 5-325 MG tablet [id number]  Take 1 tablet by mouth every 6 (six) hours as needed for moderate pain (pain score 4-6) or severe pain (pain score 7-10). Gordan Huxley, MD  Active   levothyroxine  (SYNTHROID ) 100 MCG tablet [id number]  TAKE 1 TABLET EVERY DAY BEFORE BREAKFAST Karamalegos, Marsa PARAS, DO  Active   metoprolol  succinate (TOPROL  XL) 25 MG 24 hr tablet [id number]  Take 0.5 tablets (12.5 mg total) by mouth daily. Gollan, Timothy J, MD  Active   omeprazole  (PRILOSEC) 40 MG capsule [id number] No Take 1 capsule (40 mg total) by mouth daily as needed. Edman Marsa PARAS, DO Taking Active   tiZANidine  (ZANAFLEX ) 2 MG tablet [id number] No Take 1 tablet (2 mg total) by mouth every 8 (eight) hours as needed for muscle spasms. Edman Marsa PARAS, DO Taking Active             Recommendation:   {RECOMMENDATONS:32554}  Follow Up Plan:   {FOLLOWUP:32559}  SIG ***

## 2024-09-13 NOTE — Patient Instructions (Addendum)
 Thank you for allowing the Complex Care Management team to participate in your care. It was great speaking with you!  We will follow up on September 20, 2024 at 0930. Please do not hesitate to contact me if you require assistance prior to our next outreach.   [address] [facility] Population Health RN Care Manager Direct Dial: [phone]  Fax: [phone] Website: [email or web address]

## 2024-09-19 DIAGNOSIS — D492 Neoplasm of unspecified behavior of bone, soft tissue, and skin: Secondary | ICD-10-CM | POA: Diagnosis not present

## 2024-09-19 DIAGNOSIS — C4441 Basal cell carcinoma of skin of scalp and neck: Secondary | ICD-10-CM | POA: Diagnosis not present

## 2024-09-20 ENCOUNTER — Other Ambulatory Visit: Payer: Self-pay

## 2024-09-21 NOTE — Patient Outreach (Unsigned)
 Complex Care Management   Visit Note    Name:  Stacey Duncan MRN: [id number] DOB: 1931/01/30  Situation: Referral received for Complex Care Management related to Atrial Fibrillation and Hypertension I obtained verbal consent from Patient.  Visit completed with Stacey Duncan via telephone.  Background:   Past Medical History:  Diagnosis Date   Anxiety    Fibromyalgia    GERD (gastroesophageal reflux disease)    History of echocardiogram    a. 02/2016 Echo: EF 60-65%, no rwma. Nl RV fxn and PASP.   HTN (hypertension)    Hypothyroidism    Mixed hyperlipidemia    Osteoarthritis    a. bilateral knees   PAF (paroxysmal atrial fibrillation) (HCC)    a. 02/2015 & 09/2017 - admit w/ Afib, briefly req Amio; b. CHA2DSVASc = 4-->Eliquis .   Recurrent UTI (urinary tract infection)    Sinus bradycardia    a. Has limited beta blocker dosing.    Assessment: Patient Reported Symptoms:  Cognitive Cognitive Status: Alert and oriented to person, place, and time, Normal speech and language skills Cognitive/Intellectual Conditions Management [RPT]: None reported or documented in medical history or problem list Health Maintenance Behaviors: Annual physical exam, Healthy diet, Social activities, Stress management Healing Pattern: Average Health Facilitated by: Rest, Stress management, Prayer/meditation  Neurological Neurological Review of Symptoms: Numbness Oher Neurological Symptoms/Conditions [RPT]: Reports episodes of neuropathy to lewer extremities which she describes as chronic Neurological Management Strategies: Routine screening, Medication therapy, Coping strategies Neurological Self-Management Outcome: 4 (good)  HEENT HEENT Symptoms Reported: No symptoms reported  Cardiovascular Cardiovascular Symptoms Reported: No symptoms reported Does patient have uncontrolled Hypertension?: No  Respiratory Respiratory Symptoms Reported: No symptoms reported  Endocrine Endocrine Symptoms Reported: No  symptoms reported Is patient diabetic?: No  Gastrointestinal Gastrointestinal Symptoms Reported: No symptoms reported  Genitourinary Genitourinary Symptoms Reported: No symptoms reported  Integumentary Integumentary Symptoms Reported: Incision Additional Integumentary Details: Reports small incision to behind her left ear following Dermatology visit. Reports bandage to site. Denies pain, swelling or drainage Skin Management Strategies: Routine screening  Musculoskeletal Musculoskelatal Symptoms Reviewed: Other Other Musculoskeletal Symptoms: Reports changes to mobility secondary to bilateral peripheral neuropathy. Reports using assistive device as needed  Psychosocial Psychosocial Symptoms Reported: Other Other Psychosocial Conditions: Reports some stress related to caring for spouse. Offered outreach with counseling services. Currently declines but agreed to update the team if she opts for outreach Behavioral Management Strategies: Support system, Adequate rest, Coping strategies Behavioral Health Self-Management Outcome: 4 (good) Quality of Family Relationships: supportive Do you feel physically threatened by others?: No    09/21/2024    PHQ2-9 Depression Screening   Little interest or pleasure in doing things Not at all  Feeling down, depressed, or hopeless Several days (Reports some stress and changes in mood due to caring for her spouse.)  PHQ-2 - Total Score 1    There were no vitals filed for this visit.  Medications Reviewed Today     Reviewed by Karoline Lima, RN (Registered Nurse) on 09/20/24 at 1242  Med List Status: <None>   Medication Order Taking? Sig Documenting Provider Last Dose Status Informant  atorvastatin  (LIPITOR) 80 MG tablet [id number] No TAKE 1 TABLET BY MOUTH ONCE DAILY AT  6PM Gollan, Timothy J, MD Taking Active   Cholecalciferol  25 MCG (1000 UT) tablet [id number] No Take 1,000 Units by mouth daily.  [provider] Taking Active Self           Med  Note (HARDEN, SUMMER D   Fri  Mar 13, 2016  8:48 AM)    clobetasol  cream (TEMOVATE ) 0.05 % [id number] No Apply 1 application topically 2 (two) times daily as needed (skin irritation). Edman Marsa PARAS, DO Taking Active   ELIQUIS  5 MG TABS tablet [id number] No TAKE 1 TABLET TWICE DAILY Gollan, Timothy J, MD Taking Active   FLUoxetine  (PROZAC ) 40 MG capsule [id number]  TAKE 1 CAPSULE EVERY DAY Edman Marsa PARAS, DO  Active   furosemide  (LASIX ) 20 MG tablet [id number]  TAKE 1 TABLET EVERY DAY Gollan, Timothy J, MD  Active   gabapentin  (NEURONTIN ) 300 MG capsule [id number]  TAKE 1 CAPSULE BY MOUTH IN THE MORNING AND 2 CAPSULES IN THE EVENING  Patient taking differently: TAKE 1 CAPSULE BY MOUTH IN THE MORNING AND 2 CAPSULES IN THE EVENING   Karamalegos, Marsa PARAS, DO  Active   HYDROcodone -acetaminophen  (NORCO/VICODIN) 5-325 MG tablet [id number]  Take 1 tablet by mouth every 6 (six) hours as needed for moderate pain (pain score 4-6) or severe pain (pain score 7-10). Gordan Huxley, MD  Active   levothyroxine  (SYNTHROID ) 100 MCG tablet [id number]  TAKE 1 TABLET EVERY DAY BEFORE BREAKFAST Karamalegos, Marsa PARAS, DO  Active   metoprolol  succinate (TOPROL  XL) 25 MG 24 hr tablet [id number]  Take 0.5 tablets (12.5 mg total) by mouth daily. Gollan, Timothy J, MD  Active   omeprazole  (PRILOSEC) 40 MG capsule [id number] No Take 1 capsule (40 mg total) by mouth daily as needed. Edman Marsa PARAS, DO Taking Active   tiZANidine  (ZANAFLEX ) 2 MG tablet [id number] No Take 1 tablet (2 mg total) by mouth every 8 (eight) hours as needed for muscle spasms. Edman Marsa PARAS, DO Taking Active             Recommendation:   Continue Current Plan of Care  Follow Up Plan:   Telephone follow up appointment on October 13, 2024   Jackson Acron [facility] RN Care Manager Direct Dial: [phone]  Fax: [phone] Website: [email or web address]

## 2024-09-22 NOTE — Patient Instructions (Addendum)
 Thank you for allowing the Complex Care Management team to participate in your care. It was great speaking with you!  We will follow up on October 13, 2024 at 0900. Please do not hesitate to contact me if you require assistance prior to our next outreach.   Jackson Acron [facility] Population [facility] RN Care Manager Direct Dial: [phone]  Fax: [phone] Website: [email or web address]

## 2024-10-02 ENCOUNTER — Other Ambulatory Visit: Payer: Self-pay | Admitting: Family Medicine

## 2024-10-02 ENCOUNTER — Encounter: Payer: Self-pay | Admitting: Family Medicine

## 2024-10-02 ENCOUNTER — Ambulatory Visit: Admitting: Family Medicine

## 2024-10-02 VITALS — BP 122/74 | HR 63 | Ht 63.0 in | Wt 149.1 lb

## 2024-10-02 DIAGNOSIS — I10 Essential (primary) hypertension: Secondary | ICD-10-CM

## 2024-10-02 DIAGNOSIS — Z23 Encounter for immunization: Secondary | ICD-10-CM

## 2024-10-02 DIAGNOSIS — G6289 Other specified polyneuropathies: Secondary | ICD-10-CM

## 2024-10-02 DIAGNOSIS — R7303 Prediabetes: Secondary | ICD-10-CM

## 2024-10-02 DIAGNOSIS — E032 Hypothyroidism due to medicaments and other exogenous substances: Secondary | ICD-10-CM

## 2024-10-02 DIAGNOSIS — F5104 Psychophysiologic insomnia: Secondary | ICD-10-CM

## 2024-10-02 DIAGNOSIS — M542 Cervicalgia: Secondary | ICD-10-CM | POA: Diagnosis not present

## 2024-10-02 DIAGNOSIS — M79604 Pain in right leg: Secondary | ICD-10-CM

## 2024-10-02 DIAGNOSIS — G8929 Other chronic pain: Secondary | ICD-10-CM | POA: Diagnosis not present

## 2024-10-02 DIAGNOSIS — E782 Mixed hyperlipidemia: Secondary | ICD-10-CM

## 2024-10-02 DIAGNOSIS — Z0000 Encounter for general adult medical examination without abnormal findings: Secondary | ICD-10-CM

## 2024-10-02 DIAGNOSIS — S76311A Strain of muscle, fascia and tendon of the posterior muscle group at thigh level, right thigh, initial encounter: Secondary | ICD-10-CM

## 2024-10-02 MED ORDER — TRAZODONE HCL 50 MG PO TABS: 50.0000 mg | ORAL_TABLET | Freq: Every day | ORAL | 2 refills | Status: AC

## 2024-10-02 MED ORDER — TIZANIDINE HCL 4 MG PO TABS: 4.0000 mg | ORAL_TABLET | Freq: Two times a day (BID) | ORAL | 2 refills | Status: AC | PRN

## 2024-10-02 NOTE — Patient Instructions (Addendum)
 Thank you for coming to the office today.  Ultrasound of the Right Lower Extremity to evaluate the Hamstring muscles  [facility] in [place], [place]  Address: [address], [place], [place] [address] Phone: [phone]  START anti inflammatory topical - OTC Voltaren  (generic Diclofenac ) topical 2-4 times a day as needed for pain swelling of affected joint for 1-2 weeks or longer.  May need referral to Therapist for the hamstring next based on imaging  Please schedule a Follow-up Appointment to: No follow-ups on file.  If you have any other questions or concerns, please feel free to call the office or send a message through MyChart. You may also schedule an earlier appointment if necessary.  Additionally, you may be receiving a survey about your experience at our office within a few days to 1 week by e-mail or mail. We value your feedback.  Marsa Officer, DO [facility], [place]

## 2024-10-02 NOTE — Progress Notes (Signed)
 Subjective:    Patient ID: Stacey Duncan, female    DOB: 02-28-1931, 89 y.o.   MRN: [id number]  Stacey Duncan is a 89 y.o. female presenting on 10/02/2024 for Medical Management of Chronic Issues   HPI  Discussed the use of AI scribe software for clinical note transcription with the patient, who gave verbal consent to proceed.  History of Present Illness   Stacey Duncan is a 89 year old female who presents with right leg pain and shoulder discomfort.  Right lower extremity pain - Pain localized to right leg, specifically under the knee and thigh, sometimes radiating to the hip - Pain most severe during transitions from sitting to standing and vice versa - Pain quality is distinct from usual arthritis pain - Duration of symptoms is approximately one year, with progressive worsening - Pain sometimes associated with a sensation of swelling - Pain more pronounced when sitting compared to standing - Unable to tolerate pain at current severity - Biofreeze and Voltaren  gel applied up to four times daily with limited relief  Right shoulder and scapular discomfort - New onset right shoulder discomfort, sore to palpation - Pain sometimes worsened by neck movements - Pain concentrated in the right shoulder blade area - Symptoms particularly bothersome at night, interfering with sleep - History of left arm fracture, with concern for compensatory overuse of right shoulder  Insomnia Sleep disturbance - Difficulty initiating and maintaining sleep, with frequent nighttime awakenings - Persistent sleep disturbances despite use of trazodone  (half tablet), considering increasing to a full tablet - Occasional use of small amounts of wine to aid sleep when unable to fall asleep after reading  Sensory peripheral neuropathy - Progressive worsening of sensory neuropathy symptoms - Feet frequently feel cold, requiring socks even in summer - Constant numbness on the bottom of both feet  and under the toes    - awaiting for Neurologist apt in January 2026      09/20/2024   12:42 PM 09/12/2024   11:42 AM 08/31/2024   10:55 AM  Depression screen PHQ 2/9  Decreased Interest 0 0 0  Down, Depressed, Hopeless 1 1 1   PHQ - 2 Score 1 1 1        12/24/2023   11:28 AM 06/23/2023   10:57 AM 04/13/2023   11:22 AM 06/24/2021    1:32 PM  GAD 7 : Generalized Anxiety Score  Nervous, Anxious, on Edge 1 1 1 1   Control/stop worrying 1 1 1  0  Worry too much - different things 1 1 1  0  Trouble relaxing 1 1 1 1   Restless 1 0 0 0  Easily annoyed or irritable 1 1 1 1   Afraid - awful might happen 0 1 1 1   Total GAD 7 Score 6 6 6 4   Anxiety Difficulty  Not difficult at all Somewhat difficult Somewhat difficult    Social History   Tobacco Use   Smoking status: Never   Smokeless tobacco: Never  Vaping Use   Vaping status: Never Used  Substance Use Topics   Alcohol use: Yes    Alcohol/week: 7.0 standard drinks of alcohol    Types: 7 Glasses of wine per week    Comment: glass of wine daily   Drug use: No    Review of Systems Per HPI unless specifically indicated above     Objective:    BP 122/74 (BP Location: Right Arm, Patient Position: Sitting, Cuff Size: Normal)   Pulse 63   Ht  5' 3 (1.6 m)   Wt 149 lb 2 oz (67.6 kg)   SpO2 94%   BMI 26.42 kg/m   Wt Readings from Last 3 Encounters:  10/02/24 149 lb 2 oz (67.6 kg)  08/31/24 147 lb (66.7 kg)  05/23/24 145 lb (65.8 kg)    Physical Exam Vitals and nursing note reviewed.  Constitutional:      General: She is not in acute distress.    Appearance: Normal appearance. She is well-developed. She is not diaphoretic.     Comments: Well-appearing, comfortable, cooperative  HENT:     Head: Normocephalic and atraumatic.  Eyes:     General:        Right eye: No discharge.        Left eye: No discharge.     Conjunctiva/sclera: Conjunctivae normal.  Cardiovascular:     Rate and Rhythm: Normal rate.  Pulmonary:      Effort: Pulmonary effort is normal.  Musculoskeletal:     Comments: Right lower extremity with some muscle bulging edema posterior to R knee area where hamstring muscles insert, mild tender, along posterior hamstring muscle body  R Knee full ROM  Skin:    General: Skin is warm and dry.     Findings: No erythema or rash.  Neurological:     Mental Status: She is alert and oriented to person, place, and time.  Psychiatric:        Mood and Affect: Mood normal.        Behavior: Behavior normal.        Thought Content: Thought content normal.     Comments: Well groomed, good eye contact, normal speech and thoughts     Results for orders placed or performed in visit on 05/18/24  TSH   Collection Time: 05/18/24  3:14 PM  Result Value Ref Range   TSH 0.543 0.450 - 4.500 uIU/mL  CBC   Collection Time: 05/18/24  3:14 PM  Result Value Ref Range   WBC 9.4 3.4 - 10.8 x10E3/uL   RBC 4.28 3.77 - 5.28 x10E6/uL   Hemoglobin 13.2 11.1 - 15.9 g/dL   Hematocrit 59.2 65.9 - 46.6 %   MCV 95 79 - 97 fL   MCH 30.8 26.6 - 33.0 pg   MCHC 32.4 31.5 - 35.7 g/dL   RDW 85.9 88.2 - 84.5 %   Platelets 276 150 - 450 x10E3/uL  Basic metabolic panel with GFR   Collection Time: 05/18/24  3:14 PM  Result Value Ref Range   Glucose 85 70 - 99 mg/dL   BUN 11 10 - 36 mg/dL   Creatinine, Ser 9.22 0.57 - 1.00 mg/dL   eGFR 73 >40 fO/fpw/8.26   BUN/Creatinine Ratio 14 12 - 28   Sodium 139 134 - 144 mmol/L   Potassium 4.6 3.5 - 5.2 mmol/L   Chloride 101 96 - 106 mmol/L   CO2 22 20 - 29 mmol/L   Calcium  9.2 8.7 - 10.3 mg/dL  Magnesium    Collection Time: 05/18/24  3:14 PM  Result Value Ref Range   Magnesium  2.2 1.6 - 2.3 mg/dL      Assessment & Plan:   Problem List Items Addressed This Visit   None Visit Diagnoses       Chronic pain of right lower extremity    -  Primary   Relevant Medications   traZODone  (DESYREL ) 50 MG tablet   tiZANidine  (ZANAFLEX ) 4 MG tablet   Other Relevant Orders   US  SOFT  TISSUE LOWER EXTREMITY LIMITED RIGHT (NON-VASCULAR)     Strain of right hamstring, initial encounter       Relevant Orders   US  SOFT TISSUE LOWER EXTREMITY LIMITED RIGHT (NON-VASCULAR)     Flu vaccine need       Relevant Orders   Flu vaccine HIGH DOSE PF(Fluzone Trivalent) (Completed)     Psychophysiologic insomnia       Relevant Medications   traZODone  (DESYREL ) 50 MG tablet     Neck pain       Relevant Medications   tiZANidine  (ZANAFLEX ) 4 MG tablet        Right lower extremity hamstring pain Chronic hamstring pain for one year, likely muscular with swelling.  Differential includes tendonitis vs prior injury partial tear, unlikely rupture given lack of acute trauma or injury - Order ultrasound of the right lower extremity to evaluate the hamstring muscles on R side - Order tizanidine  4 mg for muscle relaxation, to be used as needed up to twice daily.  - Recommend Voltaren  gel up to four times daily as needed. - Cannot take oral NSAID - Consider physical therapy based on ultrasound results. May return to [facility] if needed, consider injection w/ sports med  Right shoulder and upper back musculoskeletal pain New onset pain, likely musculoskeletal, possibly related to previous left arm injury. CT scan of neck has been done within past 6 months, already know cervical spine evaluation without obvious pathology R Shoulder ROM intact, seems more likely muscular etiology of scapular region - Recommend Voltaren  gel on affected area. - Consider physical therapy if symptoms persist.  Arthritis involving multiple joints Chronic arthritis in multiple joints, including knees and hands, causing significant pain.  Peripheral neuropathy Chronic peripheral neuropathy with worsening symptoms, including numbness and cold sensation in feet and toes. Temperature changes related to neuropathy. She has apt in January w/ [facility] I advised her that the treatment for neuropathy is limited,  further investigation and testing can be done to help diagnose accurately and offer treatment, limited options in meantime other than nerve supplements, and medication management - Advise contacting neurology office to inquire about an earlier appointment.  Insomnia Chronic insomnia exacerbated by musculoskeletal pain. Trazodone  provides some benefit. Discussed trazodone  dosing flexibility. - Prescribe trazodone  50 mg, instruct to take a whole pill as needed for sleep.       Orders Placed This Encounter  Procedures   US  SOFT TISSUE LOWER EXTREMITY LIMITED RIGHT (NON-VASCULAR)    Standing Status:   Future    Expiration Date:   10/02/2025    Reason for Exam (SYMPTOM  OR DIAGNOSIS REQUIRED):   Chronic right lower extremity hamstring pain with transition position behind knee and upper thigh vs gluteal    Preferred imaging location?:   [facility]   Flu vaccine HIGH DOSE PF(Fluzone Trivalent)    Meds ordered this encounter  Medications   traZODone  (DESYREL ) 50 MG tablet    Sig: Take 1 tablet (50 mg total) by mouth at bedtime.    Dispense:  30 tablet    Refill:  2   tiZANidine  (ZANAFLEX ) 4 MG tablet    Sig: Take 1 tablet (4 mg total) by mouth every 12 (twelve) hours as needed for muscle spasms.    Dispense:  60 tablet    Refill:  2    Follow up plan: Return in about 5 months (around 03/02/2025) for 5 month fasting lab > 1 week later Annual Physical.  Future labs 03/02/25  Marsa  Edman, DO [facility] 10/02/2024, 4:21 PM

## 2024-10-04 ENCOUNTER — Ambulatory Visit
Admission: RE | Admit: 2024-10-04 | Discharge: 2024-10-04 | Disposition: A | Discharge status: Home / Self Care | Source: Ambulatory Visit | Attending: Family Medicine | Admitting: Family Medicine

## 2024-10-04 DIAGNOSIS — S76311A Strain of muscle, fascia and tendon of the posterior muscle group at thigh level, right thigh, initial encounter: Secondary | ICD-10-CM | POA: Insufficient documentation

## 2024-10-04 DIAGNOSIS — M79604 Pain in right leg: Secondary | ICD-10-CM | POA: Insufficient documentation

## 2024-10-04 DIAGNOSIS — G8929 Other chronic pain: Secondary | ICD-10-CM | POA: Insufficient documentation

## 2024-10-04 DIAGNOSIS — M25561 Pain in right knee: Secondary | ICD-10-CM | POA: Diagnosis not present

## 2024-10-10 ENCOUNTER — Ambulatory Visit: Payer: Self-pay | Admitting: Family Medicine

## 2024-10-10 ENCOUNTER — Telehealth: Payer: Self-pay

## 2024-10-10 NOTE — Telephone Encounter (Signed)
 Please call patient back with ultrasound result.  The ultrasound was normal. No abnormality seen on the imaging for the muscles or soft tissues.  It could still be hamstring muscle strain injury that does not show up on imaging.  If the symptoms have not improved. We can refer to Orthopedic or Sports Medicine specialist next. Let me know!  Marsa Officer, DO [facility] 10/10/2024, 11:43 AM

## 2024-10-10 NOTE — Telephone Encounter (Signed)
 Patient notified of results. She stated she will deal with this a little longer before referral  Explained if she wanted a referral to just call and let us  know

## 2024-10-10 NOTE — Telephone Encounter (Signed)
 Copied from CRM [id number]. Topic: Clinical - Lab/Test Results >> Oct 10, 2024 10:45 AM Stacey Duncan wrote: Reason for CRM: Patient calling back to discuss results of ultrasound that she had on 10/04/2024, is req a call back.

## 2024-10-13 ENCOUNTER — Telehealth: Payer: Self-pay

## 2024-10-17 DIAGNOSIS — M170 Bilateral primary osteoarthritis of knee: Secondary | ICD-10-CM | POA: Diagnosis not present

## 2024-10-17 DIAGNOSIS — M19042 Primary osteoarthritis, left hand: Secondary | ICD-10-CM | POA: Diagnosis not present

## 2024-10-17 DIAGNOSIS — M19049 Primary osteoarthritis, unspecified hand: Secondary | ICD-10-CM | POA: Diagnosis not present

## 2024-10-24 ENCOUNTER — Other Ambulatory Visit: Payer: Self-pay

## 2024-10-24 NOTE — Patient Outreach (Unsigned)
 Complex Care Management   Visit Note  10/24/2024  Name:  Stacey Duncan MRN: [id number] DOB: 07-04-1931  Situation: Referral received for Complex Care Management related to {Criteria:32550} I obtained verbal consent from {CHL AMB Patient/Caregiver:28184}.  Visit completed with {CHL AMB Patient/Caregiver:28184}  {VISIT LOCATION:32553}  Background:   Past Medical History:  Diagnosis Date   Anxiety    Fibromyalgia    GERD (gastroesophageal reflux disease)    History of echocardiogram    a. 02/2016 Echo: EF 60-65%, no rwma. Nl RV fxn and PASP.   HTN (hypertension)    Hypothyroidism    Mixed hyperlipidemia    Osteoarthritis    a. bilateral knees   PAF (paroxysmal atrial fibrillation) (HCC)    a. 02/2015 & 09/2017 - admit w/ Afib, briefly req Amio; b. CHA2DSVASc = 4-->Eliquis .   Recurrent UTI (urinary tract infection)    Sinus bradycardia    a. Has limited beta blocker dosing.    Assessment: Patient Reported Symptoms:  Cognitive        Neurological      HEENT        Cardiovascular      Respiratory      Endocrine      Gastrointestinal        Genitourinary      Integumentary      Musculoskeletal          Psychosocial       Quality of Family Relationships: supportive Do you feel physically threatened by others?: No    10/24/2024    PHQ2-9 Depression Screening   Little interest or pleasure in doing things    Feeling down, depressed, or hopeless    PHQ-2 - Total Score    Trouble falling or staying asleep, or sleeping too much    Feeling tired or having little energy    Poor appetite or overeating     Feeling bad about yourself - or that you are a failure or have let yourself or your family down    Trouble concentrating on things, such as reading the newspaper or watching television    Moving or speaking so slowly that other people could have noticed.  Or the opposite - being so fidgety or restless that you have been moving around a lot more than usual     Thoughts that you would be better off dead, or hurting yourself in some way    PHQ2-9 Total Score    If you checked off any problems, how difficult have these problems made it for you to do your work, take care of things at home, or get along with other people    Depression Interventions/Treatment      There were no vitals filed for this visit.  Medications Reviewed Today     Reviewed by Karoline Lima, RN (Registered Nurse) on 10/24/24 at 1512  Med List Status: <None>   Medication Order Taking? Sig Documenting Provider Last Dose Status Informant  atorvastatin  (LIPITOR) 80 MG tablet [id number]  TAKE 1 TABLET BY MOUTH ONCE DAILY AT  6PM Gollan, Timothy J, MD  Active   Cholecalciferol  25 MCG (1000 UT) tablet [id number]  Take 1,000 Units by mouth daily.  [provider]  Active Self           Med Note (HARDEN, SUMMER D   Fri Mar 13, 2016  8:48 AM)    clobetasol  cream (TEMOVATE ) 0.05 % [id number]  Apply 1 application topically 2 (two) times daily as needed (skin irritation).  Edman Marsa PARAS, DO  Active   ELIQUIS  5 MG TABS tablet [id number]  TAKE 1 TABLET TWICE DAILY Gollan, Timothy J, MD  Active   FLUoxetine  (PROZAC ) 40 MG capsule [id number]  TAKE 1 CAPSULE EVERY DAY Edman Marsa PARAS, DO  Active   furosemide  (LASIX ) 20 MG tablet [id number]  TAKE 1 TABLET EVERY DAY Gollan, Timothy J, MD  Active   gabapentin  (NEURONTIN ) 300 MG capsule [id number]  TAKE 1 CAPSULE BY MOUTH IN THE MORNING AND 2 CAPSULES IN THE EVENING  Patient taking differently: TAKE 1 CAPSULE BY MOUTH IN THE MORNING AND 2 CAPSULES IN THE EVENING   Karamalegos, Marsa PARAS, DO  Active   HYDROcodone -acetaminophen  (NORCO/VICODIN) 5-325 MG tablet [id number]  Take 1 tablet by mouth every 6 (six) hours as needed for moderate pain (pain score 4-6) or severe pain (pain score 7-10). Gordan Huxley, MD  Active   levothyroxine  (SYNTHROID ) 100 MCG tablet [id number]  TAKE 1 TABLET EVERY DAY BEFORE BREAKFAST Karamalegos,  Marsa PARAS, DO  Active   metoprolol  succinate (TOPROL  XL) 25 MG 24 hr tablet [id number]  Take 0.5 tablets (12.5 mg total) by mouth daily. Gollan, Timothy J, MD  Active   omeprazole  (PRILOSEC) 40 MG capsule [id number]  Take 1 capsule (40 mg total) by mouth daily as needed. Edman Marsa PARAS, DO  Active   tiZANidine  (ZANAFLEX ) 4 MG tablet [id number]  Take 1 tablet (4 mg total) by mouth every 12 (twelve) hours as needed for muscle spasms. Edman Marsa PARAS, DO  Active   traZODone  (DESYREL ) 50 MG tablet [id number]  Take 1 tablet (50 mg total) by mouth at bedtime. Edman Marsa PARAS, DO  Active             Recommendation:   {RECOMMENDATONS:32554}  Follow Up Plan:   {FOLLOWUP:32559}  SIG ***

## 2024-10-25 NOTE — Patient Instructions (Signed)
 Thank you for allowing the Complex Care Management team to participate in your care. It was great speaking with you!  We will follow up on November 09, 2024 at 3 pm. Please do not hesitate to contact me if you require assistance prior to our next outreach.   Jackson Acron [facility] RN Care Manager Direct Dial: [phone]  Fax: [phone] Website: [email or web address]

## 2024-10-31 ENCOUNTER — Ambulatory Visit

## 2024-10-31 DIAGNOSIS — Z23 Encounter for immunization: Secondary | ICD-10-CM | POA: Diagnosis not present

## 2024-11-09 ENCOUNTER — Other Ambulatory Visit: Payer: Self-pay

## 2024-11-09 NOTE — Patient Outreach (Signed)
 Complex Care Management   Visit Note  11/09/2024  Name:  Stacey Duncan MRN: [id number] DOB: 25-May-1931  Situation: Referral received for Complex Care Management related to Atrial Fibrillation and Hypertension. I obtained verbal consent from Patient.  Visit completed with Stacey Duncan via telephone.  Background:   Past Medical History:  Diagnosis Date   Anxiety    Fibromyalgia    GERD (gastroesophageal reflux disease)    History of echocardiogram    a. 02/2016 Echo: EF 60-65%, no rwma. Nl RV fxn and PASP.   HTN (hypertension)    Hypothyroidism    Mixed hyperlipidemia    Osteoarthritis    a. bilateral knees   PAF (paroxysmal atrial fibrillation) (HCC)    a. 02/2015 & 09/2017 - admit w/ Afib, briefly req Amio; b. CHA2DSVASc = 4-->Eliquis .   Recurrent UTI (urinary tract infection)    Sinus bradycardia    a. Has limited beta blocker dosing.     Assessment: Patient Reported Symptoms: Cognitive Cognitive Status: Alert and oriented to person, place, and time, Normal speech and language skills Cognitive/Intellectual Conditions Management [RPT]: None reported or documented in medical history or problem list Health Maintenance Behaviors: Annual physical exam, Healthy diet, Social activities, Stress management Healing Pattern: Average Health Facilitated by: Prayer/meditation, Rest, Stress management  Neurological Neurological Review of Symptoms: Numbness Oher Neurological Symptoms/Conditions [RPT]: Experiences chronic numbness in lower extremities Neurological Management Strategies: Routine screening, Medication therapy, Medical device, Coping strategies, Adequate rest Neurological Self-Management Outcome: 4 (good)  HEENT HEENT Symptoms Reported: No symptoms reported HEENT Self-Management Outcome: 4 (good)  Cardiovascular Cardiovascular Symptoms Reported: No symptoms reported Does patient have uncontrolled Hypertension?: No Cardiovascular Self-Management Outcome: 4 (good)   Respiratory Respiratory Symptoms Reported: No symptoms reported Respiratory Management Strategies: Routine screening Respiratory Self-Management Outcome: 4 (good)  Endocrine Endocrine Symptoms Reported: No symptoms reported Is patient diabetic?: No Endocrine Self-Management Outcome: 4 (good)  Gastrointestinal Gastrointestinal Symptoms Reported: No symptoms reported Gastrointestinal Self-Management Outcome: 4 (good)  Genitourinary Genitourinary Symptoms Reported: No symptoms reported Genitourinary Self-Management Outcome: 4 (good)  Integumentary Integumentary Symptoms Reported: No symptoms reported Skin Management Strategies: Routine screening Skin Self-Management Outcome: 4 (good)  Musculoskeletal Musculoskelatal Symptoms Reviewed: Other Other Musculoskeletal Symptoms: Reports using cane with all ambulation due to bilateral peripheral neuropathy Musculoskeletal Management Strategies: Medical device, Medication therapy, Routine screening Musculoskeletal Self-Management Outcome: 4 (good)  Psychosocial Psychosocial Symptoms Reported: Other Other Psychosocial Conditions: Reports concerns related to caring for her spouse. Declines current need for counseling or outreach with a LCSW. Reports her son lives in the home and available to assist. Declines current need for community resources or additional in home support. Agreed to update the team if this changes. Behavioral Management Strategies: Support system, Adequate rest, Coping strategies Behavioral Health Self-Management Outcome: 4 (good) Major Change/Loss/Stressor/Fears (CP): Medical condition, family Behaviors When Feeling Stressed/Fearful: Resting or attending church Techniques to [place] with Loss/Stress/Change: Diversional activities, Spiritual practice(s), Medication Quality of Family Relationships: supportive Do you feel physically threatened by others?: No    11/13/2024    PHQ2-9 Depression Screening   Little interest or pleasure in  doing things Not at all  Feeling down, depressed, or hopeless Several days (Reports feeling down due to her spouse's declining cognition. Declines current need for counseling or community resources)  PHQ-2 - Total Score 1    There were no vitals filed for this visit. Pain Scale: 0-10 Pain Score: 0-No pain  Medications Reviewed Today     Reviewed by Karoline Lima, RN (Registered Nurse) on 11/09/24  at 1510  Med List Status: <None>   Medication Order Taking? Sig Documenting Provider Last Dose Status Informant  atorvastatin  (LIPITOR) 80 MG tablet [id number]  TAKE 1 TABLET BY MOUTH ONCE DAILY AT  6PM Gollan, Timothy J, MD  Active   Cholecalciferol  25 MCG (1000 UT) tablet [id number]  Take 1,000 Units by mouth daily.  [provider]  Active Self           Med Note (HARDEN, SUMMER D   Fri Mar 13, 2016  8:48 AM)    clobetasol  cream (TEMOVATE ) 0.05 % [id number]  Apply 1 application topically 2 (two) times daily as needed (skin irritation). Edman Marsa PARAS, DO  Active   ELIQUIS  5 MG TABS tablet [id number]  TAKE 1 TABLET TWICE DAILY Gollan, Timothy J, MD  Active   FLUoxetine  (PROZAC ) 40 MG capsule [id number]  TAKE 1 CAPSULE EVERY DAY Karamalegos, Marsa PARAS, DO  Active   furosemide  (LASIX ) 20 MG tablet [id number]  TAKE 1 TABLET EVERY DAY Gollan, Timothy J, MD  Active   gabapentin  (NEURONTIN ) 300 MG capsule [id number]  TAKE 1 CAPSULE BY MOUTH IN THE MORNING AND 2 CAPSULES IN THE EVENING  Patient taking differently: TAKE 1 CAPSULE BY MOUTH IN THE MORNING AND 2 CAPSULES IN THE EVENING   Karamalegos, Marsa PARAS, DO  Active   HYDROcodone -acetaminophen  (NORCO/VICODIN) 5-325 MG tablet [id number]  Take 1 tablet by mouth every 6 (six) hours as needed for moderate pain (pain score 4-6) or severe pain (pain score 7-10). Gordan Huxley, MD  Active   levothyroxine  (SYNTHROID ) 100 MCG tablet [id number]  TAKE 1 TABLET EVERY DAY BEFORE BREAKFAST Karamalegos, Marsa PARAS, DO  Active   metoprolol  succinate  (TOPROL  XL) 25 MG 24 hr tablet [id number]  Take 0.5 tablets (12.5 mg total) by mouth daily. Gollan, Timothy J, MD  Active   omeprazole  (PRILOSEC) 40 MG capsule [id number]  Take 1 capsule (40 mg total) by mouth daily as needed. Edman Marsa PARAS, DO  Active   tiZANidine  (ZANAFLEX ) 4 MG tablet [id number]  Take 1 tablet (4 mg total) by mouth every 12 (twelve) hours as needed for muscle spasms. Edman Marsa PARAS, DO  Active   traZODone  (DESYREL ) 50 MG tablet [id number]  Take 1 tablet (50 mg total) by mouth at bedtime. Edman Marsa PARAS, DO  Active             Recommendation:   Continue Current Plan of Care  Follow Up Plan:   Patient has met all care management goals. Care Management case will be closed. Patient has been provided contact information should new needs arise.    Jackson Acron [facility] Population Health RN Care Manager Direct Dial: [phone]  Fax: [phone] Website: [email or web address]

## 2024-11-13 NOTE — Patient Instructions (Signed)
 Thank you for allowing the Complex Care Management team to participate in your care. It was great speaking with you!  Congratulations! You have met your care management goals.  Please do not hesitate to contact your primary care provider if your health needs change and you require additional outreach. The care team will gladly assist.    Jackson Acron [facility] RN Care Manager Direct Dial: [phone]  Fax: [phone] Website: [email or web address]

## 2024-11-24 ENCOUNTER — Other Ambulatory Visit: Payer: Self-pay | Admitting: Family Medicine

## 2024-11-24 DIAGNOSIS — F411 Generalized anxiety disorder: Secondary | ICD-10-CM

## 2024-11-24 DIAGNOSIS — F339 Major depressive disorder, recurrent, unspecified: Secondary | ICD-10-CM

## 2024-11-28 NOTE — Telephone Encounter (Signed)
 Requested Prescriptions  Pending Prescriptions Disp Refills   FLUoxetine  (PROZAC ) 40 MG capsule [Pharmacy Med Name: FLUOXETINE  HYDROCHLORIDE 40 MG Oral Capsule] 90 capsule 0    Sig: TAKE 1 CAPSULE EVERY DAY     Psychiatry:  Antidepressants - SSRI Passed - 11/28/2024  4:17 PM      Passed - Completed PHQ-2 or PHQ-9 in the last 360 days      Passed - Valid encounter within last 6 months    Recent Outpatient Visits           1 month ago Chronic pain of right lower extremity   [facility] Edman Marsa PARAS, DO   9 months ago Acute cystitis with hematuria   [facility] [place], Marsa PARAS, [place]

## 2024-12-04 ENCOUNTER — Telehealth: Payer: Self-pay | Admitting: Cardiovascular Disease

## 2024-12-04 NOTE — Telephone Encounter (Signed)
 Patient c/o Palpitations:  STAT if patient reporting lightheadedness, shortness of breath, or chest pain  How long have you had palpitations/irregular HR/ Afib? Are you having the symptoms now? Since Saturday   Are you currently experiencing lightheadedness, SOB or CP? Lightheaded   Do you have a history of afib (atrial fibrillation) or irregular heart rhythm? Yes   Have you checked your BP or HR? (document readings if available): 110 HR Sunday, 49 currently   Are you experiencing any other symptoms? Lightheaded    Pt states she has been having an episode of Afib start up Saturday. Pt is having symptoms on and off, but was concerned with high hr yesterday. She has been stressed with husbands recent dementia decline. Please advise.

## 2024-12-04 NOTE — Telephone Encounter (Signed)
 Returned pt's call; identified pt using 2 identifiers; pt states that over the weekend she went into AFIB with HR 110; she took Metoprolol  XL 25 mg instead of the prescribed 12.5 mg and her HR came back down to the 80's; she stated that during the AFIB episode she did feel lightheaded with the high HR, but states she feels fine now.  I advised her that if her HR sustained in the upper 100's and she was symptomatic she should go to the ED; I also offered to obtain a clinic appointment for her, but she stated that she feels this is all due to stress because her husband's dementia has declined in the last week and a half and she is not getting much rest; we discussed staying hydrated; pt verbalized understanding and stated she would call if she had another episode.

## 2024-12-05 NOTE — Telephone Encounter (Signed)
Called patient, no answer and unable to leave message. °

## 2024-12-06 NOTE — Telephone Encounter (Signed)
 Called and spoke with the patient. Notified her of the following from Dr. Gollan.  I agree with taking a little bit extra metoprolol  if she appreciates fast heart rhythm   If she could monitor blood pressure and heart rate and share some numbers with us    If there is room on the blood pressure or heart rate, we may be able to take metoprolol  25 every day Thx TGollan  Patient verbalizes understanding.

## 2024-12-07 ENCOUNTER — Ambulatory Visit: Attending: Physician Assistant | Admitting: Physician Assistant

## 2024-12-07 ENCOUNTER — Telehealth: Payer: Self-pay | Admitting: Cardiovascular Disease

## 2024-12-07 ENCOUNTER — Encounter: Payer: Self-pay | Admitting: Physician Assistant

## 2024-12-07 VITALS — BP 126/60 | HR 71 | Ht 63.0 in | Wt 149.0 lb

## 2024-12-07 DIAGNOSIS — I10 Essential (primary) hypertension: Secondary | ICD-10-CM | POA: Diagnosis not present

## 2024-12-07 DIAGNOSIS — E782 Mixed hyperlipidemia: Secondary | ICD-10-CM | POA: Diagnosis not present

## 2024-12-07 DIAGNOSIS — I480 Paroxysmal atrial fibrillation: Secondary | ICD-10-CM | POA: Diagnosis not present

## 2024-12-07 DIAGNOSIS — R0602 Shortness of breath: Secondary | ICD-10-CM

## 2024-12-07 NOTE — Telephone Encounter (Signed)
 Pt c/o Shortness Of Breath: STAT if SOB developed within the last 24 hours or pt is noticeably SOB on the phone  1. Are you currently SOB (can you hear that pt is SOB on the phone)? Yes  2. How long have you been experiencing SOB? Since this morning  3. Are you SOB when sitting or when up moving around? Moving around   4. Are you currently experiencing any other symptoms? She stated her throat feels funny and she just feels off overall.

## 2024-12-07 NOTE — Progress Notes (Signed)
 Cardiology Office Note    Date:  12/07/2024   ID:  Stacey Duncan, DOB [birth year], MRN [id number]  PCP:  Stacey Marsa JINNY, DO  Cardiologist:  Stacey Lunger, MD  Electrophysiologist:  None   Chief Complaint: Palpitations  History of Present Illness:   Stacey Duncan is a 89 y.o. female with history of paroxysmal atrial fibrillation, hypertension, hyperlipidemia, GERD, hypothyroidism, prediabetes, depression, and anxiety who presents for evaluation of shortness of breath and palpitations.    Patient was diagnosed with atrial fibrillation 02/2015 converted with IV amiodarone .  She had recurrence of A-fib in October 2018 and again converted with amiodarone .  Echo in 2017 demonstrated EF 60% with no significant valvular abnormalities.  14-day ZIO 06/2019 showed 8 runs of SVT up to 174 bpm and 14 beats with rare ectopy.  Echo 09/2022 showed EF 60 to 65%, no RWMA, G1 DD, mild RVH, moderately elevated PA pressure, RVSP 45.2 mmHg with severe LAE and mild to moderate MR.  Patient was seen in follow-up 02/2023 experiencing significant balance issues noted when going from chair to exam table.  She denied recent falls.  Hydralazine  was held at that time.   Patient was most recently seen in office 05/18/2024 and reported occasionally feeling foggy.  This occurred randomly and she described as feeling different than fibro fog related to fibromyalgia.  She also stated this was different than her chronic fatigue.  She was told by her PCP that it could be related to her medications.  She decided to stop taking metoprolol  in the morning for 2 days prior to her visit but was still experiencing fogginess.  She is prescribed Lasix  as needed and reported her last dose had been 3 days prior to appointment.  When asked why she feels she needs to take Lasix , she stated she was not urinating as much is normal.  She was active in her home doing light to moderate household chores but reported she needed to  choose either to do housework or cook because she did not have energy to do both in 1 day.   Patient presents today with concern for intermittent palpitations with associated shortness of breath ongoing since this past Sunday. She had an episode on Sunday during which she felt like her heart was racing. When she checked her heart rate, it was elevated in the 110s bpm. She took an extra dose of metoprolol  succinate 12.5 mg with improvement in heart rate. She called into our office and was instructed to increase her dose of metoprolol  to 25 mg daily. Since then, she reports ongoing intermittent episodes of heart racing which last a few minutes at a time. She has also noticed increased shortness of breath when ambulating about her house. She does wonder if her symptoms may be related to stress. Her husband has dementia and has been steadily declining since Thanksgiving.  He often times talks in his sleep which keeps her awake at night.  She has a hard time coping with this diagnosis and feels like she has lost him.  She denies chest pain, lightheadedness, dizziness, orthopnea, PND, and lower extremity swelling.  Labs independently reviewed: 04/2024-Hgb 13.2, HCT 40.7, platelets 276, BUN 11, creatinine 0.77, sodium 139, potassium 4.6, mag 2.2, TSH 0.543  Objective   Past Medical History:  Diagnosis Date   Anxiety    Fibromyalgia    GERD (gastroesophageal reflux disease)    History of echocardiogram    a. 02/2016 Echo: EF 60-65%, no rwma. Nl RV  fxn and PASP.   HTN (hypertension)    Hypothyroidism    Mixed hyperlipidemia    Osteoarthritis    a. bilateral knees   PAF (paroxysmal atrial fibrillation) (HCC)    a. 02/2015 & 09/2017 - admit w/ Afib, briefly req Amio; b. CHA2DSVASc = 4-->Eliquis .   Recurrent UTI (urinary tract infection)    Sinus bradycardia    a. Has limited beta blocker dosing.    Current Medications: Active Medications[1]  Allergies:   Sulfa antibiotics and Sulfacetamide sodium    Social History   Socioeconomic History   Marital status: Married    Spouse name: Not on file   Number of children: Not on file   Years of education: Not on file   Highest education level: Bachelor's degree (e.g., BA, AB, BS)  Occupational History   Occupation: retired  Tobacco Use   Smoking status: Never   Smokeless tobacco: Never  Vaping Use   Vaping status: Never Used  Substance and Sexual Activity   Alcohol use: Yes    Alcohol/week: 7.0 standard drinks of alcohol    Types: 7 Glasses of wine per week    Comment: glass of wine daily   Drug use: No   Sexual activity: Not Currently    Birth control/protection: Post-menopausal  Other Topics Concern   Not on file  Social History Narrative   Not on file   Social Drivers of Health   Tobacco Use: Low Risk (12/07/2024)   Patient History    Smoking Tobacco Use: Never    Smokeless Tobacco Use: Never    Passive Exposure: Not on file  Financial Resource Strain: Low Risk (09/12/2024)   Overall Financial Resource Strain (CARDIA)    Difficulty of Paying Living Expenses: Not very hard  Food Insecurity: No Food Insecurity (09/12/2024)   Epic    Worried About Programme Researcher, Broadcasting/film/video in the Last Year: Never true    Ran Out of Food in the Last Year: Never true  Transportation Needs: No Transportation Needs (09/12/2024)   Epic    Lack of Transportation (Medical): No    Lack of Transportation (Non-Medical): No  Physical Activity: Inactive (11/09/2024)   Exercise Vital Sign    Days of Exercise per Week: 0 days    Minutes of Exercise per Session: 0 min  Stress: Stress Concern Present (08/31/2024)   [employer] of Occupational Health - Occupational Stress Questionnaire    Feeling of Stress: To some extent  Social Connections: Socially Integrated (09/12/2024)   Social Connection and Isolation Panel    Frequency of Communication with Friends and Family: More than three times a week    Frequency of Social Gatherings with Friends and  Family: More than three times a week    Attends Religious Services: More than 4 times per year    Active Member of Clubs or Organizations: Yes    Attends Banker Meetings: More than 4 times per year    Marital Status: Married  Depression (PHQ2-9): Low Risk (11/09/2024)   Depression (PHQ2-9)    PHQ-2 Score: 1  Alcohol Screen: Low Risk (08/31/2024)   Alcohol Screen    Last Alcohol Screening Score (AUDIT): 4  Housing: Unknown (09/12/2024)   Epic    Unable to Pay for Housing in the Last Year: No    Number of Times Moved in the Last Year: Not on file    Homeless in the Last Year: No  Utilities: Not At Risk (09/12/2024)   Epic  Threatened with loss of utilities: No  Health Literacy: Adequate Health Literacy (09/12/2024)   B1300 Health Literacy    Frequency of need for help with medical instructions: Never     Family History:  The patient's family history includes Heart attack in her maternal grandfather and maternal grandmother; Heart disease (age of onset: 51) in her mother.  ROS:   12-point review of systems is negative unless otherwise noted in the HPI.  EKGs/Other Studies Reviewed:    Studies reviewed were summarized above. The additional studies were reviewed today:  09/2022 Echo complete 1. Left ventricular ejection fraction, by estimation, is 60 to 65%. The  left ventricle has normal function. The left ventricle has no regional  wall motion abnormalities. Left ventricular diastolic parameters are  consistent with Grade I diastolic  dysfunction (impaired relaxation).   2. Right ventricular systolic function is normal. The right ventricular  size is mildly enlarged. There is moderately elevated pulmonary artery  systolic pressure. The estimated right ventricular systolic pressure is  45.2 mmHg.   3. Left atrial size was severely dilated.   4. The mitral valve is normal in structure. Mild to moderate mitral valve  regurgitation.   5. The aortic valve is  tricuspid. Aortic valve regurgitation is not  visualized.   6. The inferior vena cava is normal in size with <50% respiratory  variability, suggesting right atrial pressure of 8 mmHg.   06/2019 Long term monitor Normal sinus rhythm Avg HR of 70 bpm.    Predominant underlying rhythm was Sinus Rhythm.    8 Supraventricular Tachycardia/atrial tachycardia runs occurred, the run with the fastest interval lasting 4 beats with a max rate of 174 bpm,  the longest lasting 14 beats with an avg rate of 135 bpm.     Isolated SVEs were rare (<1.0%), SVE Couplets were rare (<1.0%), and SVE Triplets were rare (<1.0%). Isolated VEs were rare (<1.0%, 645), VE Couplets were rare (<1.0%, 1), and VE Triplets were rare (<1.0%, 1).  Ventricular Bigeminy and Trigeminy were present.   Patient triggered event was not associated with significant arrhythmia.  EKG:  EKG personally reviewed by me today EKG Interpretation Date/Time:  Thursday December 07 2024 14:39:09 EST Ventricular Rate:  71 PR Interval:  152 QRS Duration:  82 QT Interval:  412 QTC Calculation: 447 R Axis:   -14  Text Interpretation: Normal sinus rhythm Cannot rule out Anterior infarct (cited on or before 18-May-2024) When compared with ECG of 18-May-2024 14:35, No significant change was found Confirmed by Lorene Sinclair ([id number]) on 12/07/2024 2:43:33 PM  PHYSICAL EXAM:    VS:  BP 126/60 (BP Location: Left Arm, Patient Position: Sitting, Cuff Size: Normal)   Pulse 71   Ht 5' 3 (1.6 m)   Wt 149 lb (67.6 kg)   SpO2 96%   BMI 26.39 kg/m   BMI: Body mass index is 26.39 kg/m.  GEN: Well nourished, well developed in no acute distress NECK: No JVD; No carotid bruits CARDIAC: RRR, no murmurs, rubs, gallops RESPIRATORY:  Clear to auscultation without rales, wheezing or rhonchi  ABDOMEN: Soft, non-tender, non-distended EXTREMITIES: No edema; No deformity  Wt Readings from Last 3 Encounters:  12/07/24 149 lb (67.6 kg)  10/02/24 149 lb 2 oz  (67.6 kg)  08/31/24 147 lb (66.7 kg)                  ASSESSMENT & PLAN:   Paroxysmal atrial fibrillation Shortness of breath - Patient reports about one week  of intermittent feeling of heart racing and dyspnea on exertion. Suspect this is in the setting of increased stress. Discussed options for testing including echocardiogram and heart monitor. Patient prefers to defer testing given her age. She has had symptomatic bradycardia on higher doses of metoprolol  in the past. Recommend continuing metoprolol  succinate 12.5 mg in the evening. Will provide metoprolol  tartrate to take as needed for sustained HR > 110 bpm. Continue Eliquis  5 mg twice daily.   Hypertension - BP is well controlled on current regimen. Continue metoprolol  and Lasix .   Hyperlipidemia - Most recent lipid panel 02/2023 with LDL 69. Continue atorvastatin  80 mg daily.     Disposition: Start metoprolol  tartrate 12.5 mg daily as needed for sustained HR > 110 bpm. F/u with Dr. Gollan or an APP in 6 months.   Medication Adjustments/Labs and Tests Ordered: Current medicines are reviewed at length with the patient today.  Concerns regarding medicines are outlined above. Medication changes, Labs and Tests ordered today are summarized above and listed in the Patient Instructions accessible in Encounters.   Bonney Lesley Maffucci, PA-C 12/07/2024 3:28 PM     [facility] [address] [place], [place] [address] [phone]      [1]  Current Meds  Medication Sig   atorvastatin  (LIPITOR) 80 MG tablet TAKE 1 TABLET BY MOUTH ONCE DAILY AT  6PM   Cholecalciferol  25 MCG (1000 UT) tablet Take 1,000 Units by mouth daily.    clobetasol  cream (TEMOVATE ) 0.05 % Apply 1 application topically 2 (two) times daily as needed (skin irritation).   ELIQUIS  5 MG TABS tablet TAKE 1 TABLET TWICE DAILY   FLUoxetine  (PROZAC ) 40 MG capsule TAKE 1 CAPSULE EVERY DAY   furosemide  (LASIX ) 20 MG tablet TAKE  1 TABLET EVERY DAY   gabapentin  (NEURONTIN ) 300 MG capsule TAKE 1 CAPSULE BY MOUTH IN THE MORNING AND 2 CAPSULES IN THE EVENING   HYDROcodone -acetaminophen  (NORCO/VICODIN) 5-325 MG tablet Take 1 tablet by mouth every 6 (six) hours as needed for moderate pain (pain score 4-6) or severe pain (pain score 7-10).   levothyroxine  (SYNTHROID ) 100 MCG tablet TAKE 1 TABLET EVERY DAY BEFORE BREAKFAST   metoprolol  succinate (TOPROL  XL) 25 MG 24 hr tablet Take 0.5 tablets (12.5 mg total) by mouth daily.   metoprolol  tartrate (LOPRESSOR ) 25 MG tablet Take 0.5 tablets (12.5 mg total) by mouth as needed. As needed for Heart Rate greater than 110   omeprazole  (PRILOSEC) 40 MG capsule Take 1 capsule (40 mg total) by mouth daily as needed.   tiZANidine  (ZANAFLEX ) 4 MG tablet Take 1 tablet (4 mg total) by mouth every 12 (twelve) hours as needed for muscle spasms.   traZODone  (DESYREL ) 50 MG tablet Take 1 tablet (50 mg total) by mouth at bedtime.

## 2024-12-07 NOTE — Patient Instructions (Signed)
 Medication Instructions:  Your physician recommends the following medication changes.   START TAKING: Metoprolol  Tartrate 12.5MG  for heart rate greater than 110   *If you need a refill on your cardiac medications before your next appointment, please call your pharmacy*  Lab Work: No labs ordered today  If you have labs (blood work) drawn today and your tests are completely normal, you will receive your results only by: MyChart Message (if you have MyChart) OR A paper copy in the mail If you have any lab test that is abnormal or we need to change your treatment, we will call you to review the results.  Testing/Procedures: No test ordered today   Follow-Up: At [facility], you and your health needs are our priority.  As part of our continuing mission to provide you with exceptional heart care, our providers are all part of one team.  This team includes your primary Cardiologist (physician) and Advanced Practice Providers or APPs (Physician Assistants and Nurse Practitioners) who all work together to provide you with the care you need, when you need it.  Your next appointment:    Keep scheduled appointment   Provider:   You may see Timothy Gollan, MD or one of the following Advanced Practice Providers on your designated Care Team:   Lonni Meager, NP Lesley Maffucci, PA-C Bernardino Bring, PA-C Cadence Marydel, PA-C Tylene Lunch, NP Barnie Hila, NP    We recommend signing up for the patient portal called MyChart.  Sign up information is provided on this After Visit Summary.  MyChart is used to connect with patients for Virtual Visits (Telemedicine).  Patients are able to view lab/test results, encounter notes, upcoming appointments, etc.  Non-urgent messages can be sent to your provider as well.   To learn more about what you can do with MyChart, go to [email or web address].

## 2024-12-07 NOTE — Telephone Encounter (Signed)
 Answered an incoming call from the patient on the triage phone.  Patient complains about intermittent shortness of breath resolved by rest and caused by exertion.  Patient also states elevated and irregular heart rate with range from 70s to 110s.  Patient denies any chest pain, lightheadedness, or dizziness.  Patient does not have any blood pressure to report.  Patient denies any other symptoms.  Patient advised of protocols to go to the emergency room.  Patient verbalized understanding.  Patient also put on the schedule for an office visit today at 2:15 pm with Lesley Maffucci, PA-C.  Patient expressed gratitude for the office visit and the call.  All questions and concerns addressed at this time.

## 2025-01-15 ENCOUNTER — Ambulatory Visit: Payer: Self-pay | Admitting: Neurology

## 2025-01-27 ENCOUNTER — Other Ambulatory Visit: Payer: Self-pay | Admitting: Cardiovascular Disease

## 2025-03-02 ENCOUNTER — Other Ambulatory Visit

## 2025-03-09 ENCOUNTER — Encounter: Admitting: Family Medicine

## 2025-09-14 ENCOUNTER — Ambulatory Visit
